# Patient Record
Sex: Female | Born: 1940
Health system: Southern US, Community
[De-identification: ages and names within clinical notes are randomized; demographics above are authoritative.]

## PROBLEM LIST (undated history)

## (undated) DIAGNOSIS — K219 Gastro-esophageal reflux disease without esophagitis: Secondary | ICD-10-CM

## (undated) DIAGNOSIS — N390 Urinary tract infection, site not specified: Secondary | ICD-10-CM

## (undated) DIAGNOSIS — I10 Essential (primary) hypertension: Secondary | ICD-10-CM

## (undated) DIAGNOSIS — D649 Anemia, unspecified: Secondary | ICD-10-CM

## (undated) DIAGNOSIS — K5730 Diverticulosis of large intestine without perforation or abscess without bleeding: Secondary | ICD-10-CM

## (undated) DIAGNOSIS — K449 Diaphragmatic hernia without obstruction or gangrene: Secondary | ICD-10-CM

## (undated) DIAGNOSIS — E785 Hyperlipidemia, unspecified: Secondary | ICD-10-CM

## (undated) DIAGNOSIS — Z5189 Encounter for other specified aftercare: Secondary | ICD-10-CM

## (undated) DIAGNOSIS — D369 Benign neoplasm, unspecified site: Secondary | ICD-10-CM

## (undated) HISTORY — DX: Gastro-esophageal reflux disease without esophagitis: K21.9

## (undated) HISTORY — PX: BREAST BIOPSY: SHX20

## (undated) HISTORY — DX: Urinary tract infection, site not specified: N39.0

## (undated) HISTORY — PX: NASAL SEPTUM SURGERY: SHX37

## (undated) HISTORY — PX: TONSILLECTOMY: SUR1361

## (undated) HISTORY — DX: Diaphragmatic hernia without obstruction or gangrene: K44.9

## (undated) HISTORY — DX: Diverticulosis of large intestine without perforation or abscess without bleeding: K57.30

## (undated) HISTORY — PX: APPENDECTOMY: SHX54

## (undated) HISTORY — DX: Hyperlipidemia, unspecified: E78.5

## (undated) HISTORY — DX: Encounter for other specified aftercare: Z51.89

## (undated) HISTORY — DX: Anemia, unspecified: D64.9

## (undated) HISTORY — DX: Essential (primary) hypertension: I10

## (undated) HISTORY — PX: CATARACT EXTRACTION, BILATERAL: SHX1313

## (undated) HISTORY — DX: Benign neoplasm, unspecified site: D36.9

## (undated) HISTORY — PX: COCHLEAR IMPLANT: SUR684

---

## 1998-07-15 ENCOUNTER — Other Ambulatory Visit: Admission: RE | Admit: 1998-07-15 | Discharge: 1998-07-15 | Payer: Self-pay | Admitting: Family Medicine

## 2001-01-03 ENCOUNTER — Other Ambulatory Visit: Admission: RE | Admit: 2001-01-03 | Discharge: 2001-01-03 | Payer: Self-pay | Admitting: Family Medicine

## 2002-05-09 ENCOUNTER — Other Ambulatory Visit: Admission: RE | Admit: 2002-05-09 | Discharge: 2002-05-09 | Payer: Self-pay | Admitting: Family Medicine

## 2003-12-15 ENCOUNTER — Other Ambulatory Visit: Admission: RE | Admit: 2003-12-15 | Discharge: 2003-12-15 | Payer: Self-pay | Admitting: Family Medicine

## 2005-01-03 ENCOUNTER — Ambulatory Visit: Payer: Self-pay | Admitting: Gastroenterology

## 2005-01-16 ENCOUNTER — Other Ambulatory Visit: Admission: RE | Admit: 2005-01-16 | Discharge: 2005-01-16 | Payer: Self-pay | Admitting: Family Medicine

## 2005-01-18 ENCOUNTER — Ambulatory Visit: Payer: Self-pay | Admitting: Gastroenterology

## 2006-08-14 LAB — CONVERTED CEMR LAB

## 2006-10-10 ENCOUNTER — Other Ambulatory Visit: Admission: RE | Admit: 2006-10-10 | Discharge: 2006-10-10 | Payer: Self-pay | Admitting: Family Medicine

## 2007-08-15 HISTORY — PX: CRANIECTOMY FOR EXCISION OF ACOUSTIC NEUROMA: SUR324

## 2008-05-16 ENCOUNTER — Encounter: Admission: RE | Admit: 2008-05-16 | Discharge: 2008-05-16 | Payer: Self-pay | Admitting: Otolaryngology

## 2008-10-21 ENCOUNTER — Ambulatory Visit: Payer: Self-pay | Admitting: Internal Medicine

## 2008-10-21 DIAGNOSIS — I10 Essential (primary) hypertension: Secondary | ICD-10-CM | POA: Insufficient documentation

## 2008-10-21 DIAGNOSIS — E785 Hyperlipidemia, unspecified: Secondary | ICD-10-CM | POA: Insufficient documentation

## 2008-10-21 DIAGNOSIS — K5730 Diverticulosis of large intestine without perforation or abscess without bleeding: Secondary | ICD-10-CM | POA: Insufficient documentation

## 2008-10-21 DIAGNOSIS — D649 Anemia, unspecified: Secondary | ICD-10-CM | POA: Insufficient documentation

## 2008-10-23 ENCOUNTER — Encounter: Payer: Self-pay | Admitting: Internal Medicine

## 2008-12-28 ENCOUNTER — Telehealth: Payer: Self-pay | Admitting: Internal Medicine

## 2008-12-29 ENCOUNTER — Encounter: Payer: Self-pay | Admitting: Internal Medicine

## 2008-12-31 ENCOUNTER — Telehealth: Payer: Self-pay | Admitting: Internal Medicine

## 2009-03-01 ENCOUNTER — Ambulatory Visit: Payer: Self-pay | Admitting: Internal Medicine

## 2009-03-01 ENCOUNTER — Encounter: Payer: Self-pay | Admitting: Internal Medicine

## 2009-03-03 ENCOUNTER — Telehealth: Payer: Self-pay | Admitting: Internal Medicine

## 2009-03-10 ENCOUNTER — Encounter: Admission: RE | Admit: 2009-03-10 | Discharge: 2009-05-11 | Payer: Self-pay | Admitting: Internal Medicine

## 2009-04-02 ENCOUNTER — Ambulatory Visit: Payer: Self-pay | Admitting: Internal Medicine

## 2009-10-11 ENCOUNTER — Ambulatory Visit: Payer: Self-pay | Admitting: Internal Medicine

## 2009-10-11 DIAGNOSIS — K5732 Diverticulitis of large intestine without perforation or abscess without bleeding: Secondary | ICD-10-CM | POA: Insufficient documentation

## 2009-10-11 LAB — CONVERTED CEMR LAB
Basophils Absolute: 0 10*3/uL (ref 0.0–0.1)
Eosinophils Relative: 0.6 % (ref 0.0–5.0)
Lymphs Abs: 1.6 10*3/uL (ref 0.7–4.0)
Monocytes Absolute: 0.6 10*3/uL (ref 0.1–1.0)
Neutro Abs: 6.9 10*3/uL (ref 1.4–7.7)
Platelets: 219 10*3/uL (ref 150.0–400.0)
WBC: 9.2 10*3/uL (ref 4.5–10.5)

## 2009-10-16 ENCOUNTER — Ambulatory Visit: Payer: Self-pay | Admitting: Family Medicine

## 2009-10-18 ENCOUNTER — Telehealth: Payer: Self-pay | Admitting: Internal Medicine

## 2009-12-16 ENCOUNTER — Telehealth: Payer: Self-pay | Admitting: Internal Medicine

## 2009-12-27 ENCOUNTER — Telehealth: Payer: Self-pay | Admitting: Internal Medicine

## 2010-02-24 ENCOUNTER — Telehealth: Payer: Self-pay | Admitting: Internal Medicine

## 2010-03-18 ENCOUNTER — Ambulatory Visit: Payer: Self-pay | Admitting: Internal Medicine

## 2010-03-21 ENCOUNTER — Encounter (INDEPENDENT_AMBULATORY_CARE_PROVIDER_SITE_OTHER): Payer: Self-pay | Admitting: *Deleted

## 2010-03-26 ENCOUNTER — Ambulatory Visit: Payer: Self-pay | Admitting: Family Medicine

## 2010-03-26 LAB — CONVERTED CEMR LAB
Bilirubin Urine: NEGATIVE
Blood in Urine, dipstick: NEGATIVE
Glucose, Urine, Semiquant: NEGATIVE
Ketones, urine, test strip: NEGATIVE
Nitrite: NEGATIVE
Specific Gravity, Urine: <1.005

## 2010-06-10 ENCOUNTER — Ambulatory Visit: Payer: Self-pay | Admitting: Internal Medicine

## 2010-06-10 DIAGNOSIS — J069 Acute upper respiratory infection, unspecified: Secondary | ICD-10-CM | POA: Insufficient documentation

## 2010-09-13 NOTE — Progress Notes (Signed)
  Phone Note Refill Request Message from:  Fax from Pharmacy on February 24, 2010 8:56 AM  Refills Requested: Medication #1:  LIPITOR 20 MG TABS Take 1 tablet by mouth once a day   Dosage confirmed as above?Dosage Confirmed   Supply Requested: 3 months   Last Refilled: 11/22/2009 Initial call taken by: Rock Nephew CMA,  February 24, 2010 8:57 AM    Prescriptions: LIPITOR 20 MG TABS (ATORVASTATIN CALCIUM) Take 1 tablet by mouth once a day  #90 x 3   Entered by:   Rock Nephew CMA   Authorized by:   Etta Grandchild MD   Signed by:   Rock Nephew CMA on 02/24/2010   Method used:   Faxed to ...       [facility] (retail)             , [place]         Ph: [phone]       Fax: [phone]   RxID:   [id number]

## 2010-09-13 NOTE — Letter (Signed)
Summary: TB Skin Test  All     ,     Phone:   Fax:           TB Skin Test    Emily Sweeney    Date TB Test Placed:  03/18/10  L  forearm  TB Test Placed by:  Orlan Leavens, RMA   Date TB Test Read:   03/21/10    Result: Negative  TB Test Read by:  Orlan Leavens, RMA

## 2010-09-13 NOTE — Assessment & Plan Note (Signed)
Summary: SORE THROAT   EARACHE   STC   Vital Signs:  Patient profile:   70 year old female Menstrual status:  postmenopausal Height:      65 inches Weight:      144.50 pounds O2 Sat:      95 % on Room air Temp:     98.7 degrees F oral Pulse rate:   80 / minute Pulse rhythm:   regular Resp:     16 per minute BP sitting:   120 / 72  (left arm) Cuff size:   regular  Vitals Entered By: Jarome Lamas (June 10, 2010 9:23 AM)  O2 Flow:  Room air CC: ear discomfort/pb, URI symptoms Comments pt no longer taking cipro 250 mg     Menstrual Status postmenopausal Last PAP Result done   Primary Care Provider:  Etta Grandchild MD  CC:  ear discomfort/pb and URI symptoms.  History of Present Illness:  URI Symptoms      This is a 70 year old woman who presents with URI symptoms.  The symptoms began 5 days ago.  The severity is described as mild.  The patient reports nasal congestion, clear nasal discharge, sore throat, earache, and sick contacts, but denies purulent nasal discharge and dry cough.  The patient denies fever, stiff neck, dyspnea, wheezing, rash, vomiting, diarrhea, use of an antipyretic, and response to antipyretic.  The patient denies headache, muscle aches, and severe fatigue.  The patient denies the following risk factors for Strep sinusitis: unilateral facial pain, unilateral nasal discharge, poor response to decongestant, double sickening, tooth pain, Strep exposure, tender adenopathy, and absence of cough.    Preventive Screening-Counseling & Management  Alcohol-Tobacco     Alcohol drinks/day: 0     Alcohol Counseling: not indicated; patient does not drink     Smoking Status: never     Tobacco Counseling: not indicated; no tobacco use  Current Medications (verified): 1)  Lipitor 20 Mg Tabs (Atorvastatin Calcium) .... Take 1 Tablet By Mouth Once A Day 2)  Ramipril 10 Mg Caps (Ramipril) .... Take 1 Tablet By Mouth Once A Day 3)  Amlodipine Besylate 10 Mg Tabs  (Amlodipine Besylate) .... 1/2 Qdtab 4)  Cipro 250 Mg Tabs (Ciprofloxacin Hcl) .Marland Kitchen.. 1 By Mouth Two Times A Day For 5 Days  Allergies (verified): 1)  ! Penicillin 2)  ! Sulfa 3)  ! * Ryzolt    Past History:  Past Medical History: Last updated: 10/11/2009 Anemia-NOS Diverticulosis, colon Hyperlipidemia Hypertension Right hearing loss and tinnitus  Past Surgical History: Last updated: 10/21/2008 right acoustic neuroma removed Appendectomy Lumpectomy Tonsillectomy  Family History: Last updated: 10/21/2008 Family History of Stroke M 1st degree relative <50 Family History of Sudden Death  Social History: Last updated: 10/21/2008 Occupation: Warden/ranger Divorced Never Smoked Alcohol use-no Drug use-no Regular exercise-yes  Risk Factors: Alcohol Use: 0 (06/10/2010) Exercise: yes (10/21/2008)  Risk Factors: Smoking Status: never (06/10/2010)  Family History: Reviewed history from 10/21/2008 and no changes required. Family History of Stroke M 1st degree relative <50 Family History of Sudden Death  Social History: Reviewed history from 10/21/2008 and no changes required. 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    Physical Exam  General:  alert, well-developed, well-nourished, well-hydrated, appropriate dress, normal appearance, and healthy-appearing.  Head:  normocephalic, atraumatic, no abnormalities observed, no abnormalities palpated, and no alopecia.   Ears:  R ear normal and L TM erythema.   Nose:  External nasal examination shows no deformity or inflammation. Nasal mucosa are pink and moist without lesions or exudates. Mouth:  good dentition, no exudates, no  posterior lymphoid hypertrophy, no postnasal drip, no pharyngeal crowing, no lesions, no aphthous ulcers, no erosions, no tongue abnormalities, no leukoplakia, no petechiae, and pharyngeal erythema.   Neck:  supple, full ROM, no masses, no thyromegaly, no JVD, normal carotid upstroke, no carotid bruits, no cervical lymphadenopathy, and no neck tenderness.   Lungs:  normal respiratory effort, no intercostal retractions, no accessory muscle use, normal breath sounds, no dullness, no fremitus, no crackles, and no wheezes.   Heart:  normal rate, regular rhythm, no murmur, no gallop, and no rub.   Abdomen:  soft, non-tender, normal bowel sounds, no distention, no masses, no guarding, no rigidity, no rebound tenderness, no abdominal hernia, no inguinal hernia, no hepatomegaly, and no splenomegaly.   Msk:  No deformity or scoliosis noted of thoracic or lumbar spine.   Pulses:  R and L carotid,radial,femoral,dorsalis pedis and posterior tibial pulses are full and equal bilaterally Extremities:  No clubbing, cyanosis, edema, or deformity noted with normal full range of motion of all joints.   Neurologic:  No cranial nerve deficits noted. Station and gait are normal. Plantar reflexes are down-going bilaterally. DTRs are symmetrical throughout. Sensory, motor and coordinative functions appear intact. Skin:  turgor normal, color normal, no rashes, no suspicious lesions, no ecchymoses, no petechiae, no purpura, no ulcerations, and no edema.   Cervical Nodes:  no anterior cervical adenopathy and no posterior cervical adenopathy.   Axillary Nodes:  no R axillary adenopathy and no L axillary adenopathy.   Inguinal Nodes:  no R inguinal adenopathy and no L inguinal adenopathy.   Psych:  Cognition and judgment appear intact. Alert and cooperative with normal attention span and concentration. No apparent delusions, illusions, hallucinations   Impression & Recommendations:  Problem # 1:  URI (ICD-465.9) Assessment  New  start Zithromax Instructed on symptomatic treatment. Call if symptoms persist or worsen.   Complete Medication List: 1)  Lipitor 20 Mg Tabs (Atorvastatin calcium) .... Take 1 tablet by mouth once a day 2)  Ramipril 10 Mg Caps (Ramipril) .... Take 1 tablet by mouth once a day 3)  Amlodipine Besylate 10 Mg Tabs (Amlodipine besylate) .... 1/2 qdtab 4)  Azithromycin 500 Mg Tabs (Azithromycin) .... One by mouth once daily for 3 days  Other Orders: Flu Vaccine 93yrs + MEDICARE PATIENTS (Z6109) Administration Flu vaccine - MCR (U0454)  Patient Instructions: 1)  Please schedule a follow-up appointment in 2 weeks. 2)  Get plenty of rest, drink lots of clear liquids, and use Tylenol or Ibuprofen for fever and comfort. Return in 7-10 days if you're not better:sooner if you're feeling worse. 3)  Take your antibiotic as prescribed until ALL of it is gone, but stop if you develop a rash or swelling and contact our office as soon as possible. Prescriptions: AZITHROMYCIN 500 MG TABS (AZITHROMYCIN) One by mouth once daily for 3 days  #3 x 0   Entered and Authorized by:   Etta Grandchild MD   Signed by:   Etta Grandchild MD on 06/10/2010   Method used:   Electronically to        [facility]* (retail)       [address] [facility]  [address]       [place], [place]  [address]       Ph: [phone]       Fax: [phone]   RxID:   [id number]    Orders Added: 1)  Flu Vaccine 59yrs + MEDICARE PATIENTS [Q2039] 2)  Administration Flu vaccine - MCR [G0008] 3)  Est. Patient Level IV [10272]   Flu Vaccine Consent Questions     Do you have a history of severe allergic reactions to this vaccine? no    Any prior history of allergic reactions to egg and/or gelatin? no    Do you have a sensitivity to the preservative Thimersol? no    Do you have a past history of Guillan-Barre Syndrome? no    Do you currently have an acute febrile illness? no    Have you ever had a severe reaction to latex? no     Vaccine information given and explained to patient? yes    Are you currently pregnant? no    Lot Number:[id number]   Exp Date:02/11/2011   Site Given  Left Deltoid IM        Flu Vaccine Consent Questions     Do you have a history of severe allergic reactions to this vaccine? no    Any prior history of allergic reactions to egg and/or gelatin? no    Do you have a sensitivity to the preservative Thimersol? no    Do you have a past history of Guillan-Barre Syndrome? no    Do you currently have an acute febrile illness? no    Have you ever had a severe reaction to latex? no    Vaccine information given and explained to patient? yes    Are you currently pregnant? no    Lot Number:[id number]   Exp Date:02/11/2011   Site Given  Left Deltoid IM

## 2010-09-13 NOTE — Progress Notes (Signed)
Summary: CALL A NURSE  Phone Note From Other Clinic   Summary of Call: [facility]Nurse Triage Call Report Bennie Dallas Operator: [id number] Triage Record Num: Call Date & Time: Emily Sweeney Patient Name: 10/16/2009 8:43:47AM Emily Sweeney PCP: Patient Phone: PCP Fax : [phone] Patient Gender: Female 1941-05-29 Patient DOB: Practice Name: [facility] Reason for Call: Pt calling, fell on pavement 10/15/09 landed on face, noted right eye is swollen, pooling of blood underneath eye, no changes in vision, has a lump above eye approx, w/superficial laceration, Per dispo see<4hrs, transferred call to office for appt Eye: Injury / UV Light Exposure Protocol(s) Used: See Provider within 4 hours Recommended Outcome per Protocol: Reason for Outcome: Severe swelling (involves all soft tissue) around eye after injury to face Care Advice: SYMPTOM / CONDITION MANAGEMENT Initial call taken by: Lamar Sprinkles, CMA,  October 18, 2009 1:36 PM

## 2010-09-13 NOTE — Assessment & Plan Note (Signed)
Summary: ABD PAIN/FEVER-LB   Vital Signs:  Patient profile:   70 year old female Height:      65 inches (165.10 cm) Weight:      145.2 pounds (66.00 kg) BMI:     24.25 O2 Sat:      96 % on Room air Temp:     99.0 degrees F (37.22 degrees C) oral Pulse rate:   98 / minute BP sitting:   120 / 78  (left arm) Cuff size:   regular  Vitals Entered By: Orlan Leavens (October 11, 2009 10:35 AM)  O2 Flow:  Room air CC: abdominal pain & fever x's 2 days. pt states she head 1 solid bowel movement yesterday. No diarrhea but been having abdominal pain since , Abdominal pain Is Patient Diabetic? No Pain Assessment Patient in pain? yes     Location: abdominal  Type: aching   Primary Care Provider:  Etta Grandchild MD  CC:  abdominal pain & fever x's 2 days. pt states she head 1 solid bowel movement yesterday. No diarrhea but been having abdominal pain since  and Abdominal pain.  History of Present Illness:  Abdominal Pain      This is a 70 year old woman who presents with Abdominal pain.  The symptoms began 2 days ago.  On a scale of 1 to 10, the intensity is described as a 4.  The patient reports constipation, but denies vomiting, melena, hematochezia, and hematemesis.  The location of the pain is right lower quadrant and left lower quadrant.  The pain is described as constant and sharp.  Associated symptoms include fever.  The patient denies the following symptoms: dysuria.    Current Medications (verified): 1)  Lipitor 20 Mg Tabs (Atorvastatin Calcium) .... Take 1 Tablet By Mouth Once A Day 2)  Ramipril 10 Mg Caps (Ramipril) .... Take 1 Tablet By Mouth Once A Day 3)  Amlodipine Besylate 10 Mg Tabs (Amlodipine Besylate) .... 1/2 Qdtab  Allergies (verified): 1)  ! Penicillin 2)  ! Sulfa 3)  ! * Ryzolt  Past History:  Past Medical History: Anemia-NOS Diverticulosis, colon Hyperlipidemia Hypertension Right hearing loss and tinnitus  Review of Systems       The patient  complains of anorexia, fever, and abdominal pain.  The patient denies hoarseness, chest pain, dyspnea on exertion, and headaches.    Physical Exam  General:  alert, well-developed, well-nourished, and cooperative to examination.    mildly ill Eyes:  vision grossly intact; pupils equal, round and reactive to light.  conjunctiva and lids normal.    Lungs:  normal respiratory effort, no intercostal retractions or use of accessory muscles; normal breath sounds bilaterally - no crackles and no wheezes.    Heart:  normal rate, regular rhythm, no murmur, and no rub. BLE without edema. Abdomen:  soft, mild-mod tender in LLQ, normal bowel sounds, no distention; no masses and no appreciable hepatomegaly or splenomegaly.     Impression & Recommendations:  Problem # 1:  DIVERTICULITIS OF COLON (ICD-562.11) known diverticulosis on prior colo check labs -  abx - clinda (in place of flagyl) + cipro  consider CT if cont fever or more pain or excessively high WBC/anemia but otherwise tx emperically as discussed -  Orders: Prescription Created Electronically [phone]) TLB-CBC Platelet - w/Differential (85025-CBCD)  Complete Medication List: 1)  Lipitor 20 Mg Tabs (Atorvastatin calcium) .... Take 1 tablet by mouth once a day 2)  Ramipril 10 Mg Caps (Ramipril) .... Take 1  tablet by mouth once a day 3)  Amlodipine Besylate 10 Mg Tabs (Amlodipine besylate) .... 1/2 qdtab 4)  Clindamycin Hcl 300 Mg Caps (Clindamycin hcl) .Marland Kitchen.. 1 by mouth four times a day x 7days 5)  Cipro 500 Mg Tabs (Ciprofloxacin hcl) .Marland Kitchen.. 1 by mouth two times a day x 7days  Patient Instructions: 1)  it was good to see you today. 2)  antibiotics for presumed diverticultitis as discussed - your prescriptions have been electronically submitted to your pharmacy. Please take as directed. Contact our office if you believe you're having problems with the medication(s).  3)  Your physician has requested that you have the following labwork done  today: CBC 4)  Get plenty of rest, drink lots of clear liquids, and use Tylenol or Ibuprofen for fever and comfort. Return in 7-10 days if you're not better:sooner if you're feeling worse. 5)  if your symptoms continue to worsen (pain, fever, etc), or if you are unable take anything by mouth (pills, fluids, etc), you should go to the emergency room for further evaluation and treatment.  Prescriptions: CIPRO 500 MG TABS (CIPROFLOXACIN HCL) 1 by mouth two times a day x 7days  #14 x 0   Entered and Authorized by:   Newt Lukes MD   Signed by:   Newt Lukes MD on 10/11/2009   Method used:   Electronically to        [facility]* (retail)       [address]       [place], [place]  [address]       Ph: [phone]       Fax: [phone]   RxID:   [id number] CLINDAMYCIN HCL 300 MG CAPS (CLINDAMYCIN HCL) 1 by mouth four times a day x 7days  #28 x 0   Entered and Authorized by:   Newt Lukes MD   Signed by:   Newt Lukes MD on 10/11/2009   Method used:   Electronically to        [facility]* (retail)       [address]       [place], [place]  [id number]       Ph: [phone]       Fax: [phone]   RxID:   [id number]

## 2010-09-13 NOTE — Assessment & Plan Note (Signed)
Summary: TB TEST / VAL Natale Milch  Nurse Visit   Vitals Entered By: Orlan Leavens RMA (March 18, 2010 3:04 PM)  Allergies: 1)  ! Penicillin 2)  ! Sulfa 3)  ! * Ryzolt  Immunizations Administered:  PPD Skin Test:    Vaccine Type: PPD    Site: left forearm    Mfr: Sanofi Pasteur    Dose: 0.1 ml    Route: ID    Given by: Orlan Leavens RMA    Exp. Date: 01/09/2012    Lot #: [id number]  PPD Results    Date of reading: 03/21/2010    Results: < 5mm    Interpretation: negative  Orders Added: 1)  TB Skin Test [86580] 2)  Admin 1st Vaccine [32202]

## 2010-09-13 NOTE — Progress Notes (Signed)
  Phone Note Refill Request Message from:  Fax from Pharmacy  Refills Requested: Medication #1:  RAMIPRIL 10 MG CAPS Take 1 tablet by mouth once a day   Last Refilled: 09/26/2009    Prescriptions: RAMIPRIL 10 MG CAPS (RAMIPRIL) Take 1 tablet by mouth once a day  #90 x 3   Entered by:   Rock Nephew CMA   Authorized by:   Etta Grandchild MD   Signed by:   Rock Nephew CMA on 12/16/2009   Method used:   Faxed to ...       [facility]* (mail-order)       [address]       [place], [place]  [address]       Ph: [phone]       Fax: [phone]   RxID:   [id number]

## 2010-09-13 NOTE — Assessment & Plan Note (Signed)
Summary: UTI//VGJ   Vital Signs:  Patient profile:   70 year old female Weight:      143 pounds BMI:     23.88 Temp:     98.1 degrees F Pulse rate:   83 / minute BP sitting:   126 / 68  (left arm)  Vitals Entered By: Lamar Sprinkles, CMA (March 26, 2010 9:49 AM) CC: C/o urinary frequency, burning & pain. LBP & pelvic pressure x 2 days. Has been drinking cranberry juice. OTC uti test was positive for leukocytes   History of Present Illness: had some mild frequency and burning several days ago  then worse  then low back pain and pain over bladder  drinks lots of water and cranberry  had pos leukocytes in urine at home   low grade fever yesterday  no n/v at all   gets utis occas -- in cycles -- has been several years   is all to pcn and sulfa  Allergies: 1)  ! Penicillin 2)  ! Sulfa 3)  ! 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 CV:  Denies chest pain or discomfort and palpitations. Resp:  Denies cough and wheezing. GI:  Denies abdominal pain and change in bowel habits. GU:  Complains of dysuria and urinary frequency. Derm:  Denies itching, lesion(s), poor wound healing, and rash. Neuro:  Denies numbness and tingling. Heme:  Denies abnormal bruising and  bleeding.  Physical Exam  General:  well appearing  Head:  normocephalic, atraumatic, and no abnormalities observed.   Mouth:  pharynx pink and moist.   Neck:  No deformities, masses, or tenderness noted. Lungs:  normal respiratory effort.   Heart:  normal rate, regular rhythm, no murmur, and no rub. BLE without edema. Abdomen:  tender over suprapubic area without rebound or gaurding   Msk:  no CVA tenderness  Extremities:  No clubbing, cyanosis, edema, or deformity noted with normal full range of motion of all joints.   Skin:  Intact without suspicious lesions or rashes Cervical Nodes:  No lymphadenopathy noted Inguinal Nodes:  No significant adenopathy Psych:  normal affect, talkative and pleasant    Impression & Recommendations:  Problem # 1:  UTI (ICD-599.0) Assessment New  with pos ua at home- neg here but has had a lot of fluids and diluted symptoms are classic for uti- will treat based on this  tx with cipro recommend sympt care- see pt instructions   pt advised to update me if symptoms worsen or do not improve  Her updated medication list for this problem includes:    Cipro 250 Mg Tabs (Ciprofloxacin hcl) .Marland Kitchen... 1 by mouth two times a day for 5 days  Orders: UA Dipstick w/o Micro (manual) (32440)  Complete Medication List: 1)  Lipitor 20 Mg Tabs (Atorvastatin calcium) .... Take 1 tablet by mouth once a day 2)  Ramipril 10 Mg Caps (Ramipril) .... Take 1 tablet by mouth once a day 3)  Amlodipine  Besylate 10 Mg Tabs (Amlodipine besylate) .... 1/2 qdtab 4)  Cipro 250 Mg Tabs (Ciprofloxacin hcl) .Marland Kitchen.. 1 by mouth two times a day for 5 days  Patient Instructions: 1)  continue drinking lots of water 2)  call or seek care is symptoms don't improve in 2-3 days or if you develop back pain, nausea, or vomiting  3)  take the cipro as directed -- I sent to your pharmacy  Prescriptions: CIPRO 250 MG TABS (CIPROFLOXACIN HCL) 1 by mouth two times a day for 5 days  #10 x 0    Entered and Authorized by:   Judith Part MD   Signed by:   Judith Part MD on 03/26/2010   Method used:   Electronically to        [facility]* (retail)       [address]       [place], [place]  [id number]       Ph: [phone]       Fax: [phone]   RxID:   [id number]   Laboratory Results   Urine Tests   Date/Time Reported: Lamar Sprinkles, CMA  March 26, 2010 9:56 AM   Routine Urinalysis   Color: lt. yellow Appearance: Clear Glucose: negative   (Normal Range: Negative) Bilirubin: negative   (Normal Range: Negative) Ketone: negative   (Normal Range: Negative) Spec. Gravity: <1.005   (Normal Range: 1.003-1.035) Blood: negative   (Normal Range: Negative) pH: 7.5   (Normal Range: 5.0-8.0) Protein: negative   (Normal Range: Negative) Urobilinogen: negative   (Normal Range: 0-1) Nitrite: negative   (Normal Range: Negative) Leukocyte Esterace: negative   (Normal Range: Negative)         Immunization History:  Tetanus/Td Immunization History:    Tetanus/Td:  historical (11/13/2006)

## 2010-09-13 NOTE — Assessment & Plan Note (Signed)
Summary: FELL AND HURT HEAD/DLO   Vital Signs:  Patient profile:   70 year old female Weight:      146 pounds Temp:     98 degrees F oral Pulse rate:   72 / minute Pulse rhythm:   regular BP sitting:   130 / 78  (left arm) Cuff size:   regular  Vitals Entered By: Lowella Petties CMA (October 16, 2009 9:53 AM) CC: Emily Sweeney yesterday, hit right side of head   Primary Care Provider:  Etta Grandchild MD   History of Present Illness: 70 year old:  Emily Sweeney on her face and hit her head, elbow and right knee Does not feel that bad overall - has some bruising around her R eye  Kept some ice on it.   no neuro signs.  the patient feels fine, she is thinking clearly,  she is acting normally,  however she is somewhat sore.  Review of systems: No nausea, vomiting, slurred speech,  altered muscular control. no balance issues.  Gen.: Well-developed well-nourished, no acute distress.  HEENT: Diffuse swelling and bruising around the patient's right eye there no step-offs palpable, there is no pain with manipulation of the nose, extraocular movements are intact, pupils equal round reactive to light and accommodation.   Right elbow with some abrasions, noted full range of motion with normal pronation and supination  Right knee additionally with some abrasions, however quite normal range of motion.. Stable varus and valgus stress, negative Lachman, no significant flexion pinch, negative McMurray's test. No patellar apprehension, and grossly stable exam.  Allergies: 1)  ! Penicillin 2)  ! Sulfa 3)  ! * Ryzolt  Past History:  Past medical, surgical, family and social histories (including risk factors) reviewed, and no changes noted (except as noted below).  Past Medical History: Reviewed history from 10/11/2009 and no changes required. Anemia-NOS Diverticulosis, colon Hyperlipidemia Hypertension Right hearing loss and tinnitus  Past Surgical History: Reviewed history from 10/21/2008 and no  changes required. right acoustic neuroma removed Appendectomy Lumpectomy Tonsillectomy  Family History: Reviewed history from 10/21/2008 and no changes required. Family History of Stroke M 1st degree relative <50 Family History of Sudden Death  Social History: Reviewed history from 10/21/2008 and no changes required. Occupation: Warden/ranger Divorced Never Smoked Alcohol use-no Drug use-no Regular exercise-yes  Physical Exam  Ears:  no external deformities.   Nose:  no external deformity.   Lungs:  normal respiratory effort.   Extremities:  No clubbing, cyanosis, edema, or deformity noted with normal full range of motion of all joints.   Neurologic:  alert & oriented X3 and gait normal.   Cervical Nodes:  No lymphadenopathy noted Psych:  Cognition and judgment appear intact. Alert and cooperative with normal attention span and concentration. No apparent delusions, illusions, hallucinations   Impression & Recommendations:  Problem # 1:  CONTUSION OF FACE SCALP AND NECK EXCEPT EYE (ICD-920) Assessment New consistent with soft tissue injury, black eye, right-sided.  Necessarily, patient will have bone bruise associated with this injury and fall, however not  consistent with acute fracture.   5 anticipate that the patient did have significant pain for minimally 3 weeks, and explained to her that her bruising will become more diffuse  particularly throughout  the right side of her face..  Recommended icing her face every 2 hours for 20 minutes,  throughout the weekend, and using Tylenol or Motrin as needed for pain.  date of injury October 15, 2009  Problem # 2:  CONTUSION, ELBOW (ICD-923.11) Assessment: New soft tissue contusion with bone contusion.  Problem # 3:  CONTUSION OF KNEE (ICD-924.11) Assessment: New do not suspect internal derangement, soft tissue contusion and bone contusion  Complete Medication List: 1)  Lipitor 20 Mg Tabs (Atorvastatin calcium) .... Take 1  tablet by mouth once a day 2)  Ramipril 10 Mg Caps (Ramipril) .... Take 1 tablet by mouth once a day 3)  Amlodipine Besylate 10 Mg Tabs (Amlodipine besylate) .... 1/2 qdtab 4)  Clindamycin Hcl 300 Mg Caps (Clindamycin hcl) .Marland Kitchen.. 1 by mouth four times a day x 7days 5)  Cipro 500 Mg Tabs (Ciprofloxacin hcl) .Marland Kitchen.. 1 by mouth two times a day x 7days  Prior Medications (reviewed today): LIPITOR 20 MG TABS (ATORVASTATIN CALCIUM) Take 1 tablet by mouth once a day RAMIPRIL 10 MG CAPS (RAMIPRIL) Take 1 tablet by mouth once a day AMLODIPINE BESYLATE 10 MG TABS (AMLODIPINE BESYLATE) 1/2 qdtab CLINDAMYCIN HCL 300 MG CAPS (CLINDAMYCIN HCL) 1 by mouth four times a day x 7days CIPRO 500 MG TABS (CIPROFLOXACIN HCL) 1 by mouth two times a day x 7days Current Allergies: ! PENICILLIN ! SULFA ! * RYZOLT

## 2010-09-13 NOTE — Progress Notes (Signed)
Summary: Refill--Amlodipine  Phone Note Refill Request Message from:  Fax from Pharmacy on Dec 27, 2009 3:13 PM  Refills Requested: Medication #1:  AMLODIPINE BESYLATE 10 MG TABS 1/2 qdtab.   Notes: [facility] Initial call taken by: Lucious Groves,  Dec 27, 2009 3:13 PM    Prescriptions: AMLODIPINE BESYLATE 10 MG TABS (AMLODIPINE BESYLATE) 1/2 qdtab  #90 x 3   Entered by:   Lucious Groves   Authorized by:   Etta Grandchild MD   Signed by:   Lucious Groves on 12/27/2009   Method used:   Faxed to ...       [facility]* (mail-order)       [address]       [place], [place]  [address]       Ph: [phone]       Fax: [phone]   RxID:   [id number]

## 2011-01-05 ENCOUNTER — Telehealth: Payer: Self-pay | Admitting: *Deleted

## 2011-01-05 MED ORDER — RAMIPRIL 10 MG PO CAPS: 10.0000 mg | ORAL_CAPSULE | Freq: Every day | ORAL | Status: DC

## 2011-01-05 NOTE — Telephone Encounter (Signed)
Rx Done . 

## 2011-01-06 ENCOUNTER — Other Ambulatory Visit: Payer: Self-pay | Admitting: *Deleted

## 2011-01-06 MED ORDER — RAMIPRIL 10 MG PO CAPS: 10.0000 mg | ORAL_CAPSULE | Freq: Every day | ORAL | Status: DC

## 2011-02-14 ENCOUNTER — Other Ambulatory Visit: Payer: Self-pay | Admitting: *Deleted

## 2011-02-14 MED ORDER — ATORVASTATIN CALCIUM 20 MG PO TABS: 20.0000 mg | ORAL_TABLET | Freq: Every day | ORAL | Status: DC

## 2011-03-30 ENCOUNTER — Other Ambulatory Visit: Payer: Self-pay | Admitting: *Deleted

## 2011-03-30 MED ORDER — AMLODIPINE BESYLATE 10 MG PO TABS: ORAL_TABLET | ORAL | Status: DC

## 2011-03-30 NOTE — Telephone Encounter (Signed)
Pt requesting refill of Amlodipine be sent to [facility]. Rx sent, pt informed.

## 2011-07-04 ENCOUNTER — Encounter: Payer: Self-pay | Admitting: Internal Medicine

## 2011-07-04 ENCOUNTER — Other Ambulatory Visit (INDEPENDENT_AMBULATORY_CARE_PROVIDER_SITE_OTHER): Payer: Medicare Other

## 2011-07-04 ENCOUNTER — Ambulatory Visit (INDEPENDENT_AMBULATORY_CARE_PROVIDER_SITE_OTHER): Payer: Medicare Other | Admitting: Internal Medicine

## 2011-07-04 VITALS — BP 122/72 | HR 73 | Temp 98.3°F | Resp 16 | Ht 67.0 in | Wt 147.0 lb

## 2011-07-04 DIAGNOSIS — N390 Urinary tract infection, site not specified: Secondary | ICD-10-CM | POA: Insufficient documentation

## 2011-07-04 DIAGNOSIS — Z23 Encounter for immunization: Secondary | ICD-10-CM

## 2011-07-04 DIAGNOSIS — I10 Essential (primary) hypertension: Secondary | ICD-10-CM

## 2011-07-04 LAB — URINALYSIS, ROUTINE W REFLEX MICROSCOPIC
Specific Gravity, Urine: 1.02 (ref 1.000–1.030)
Total Protein, Urine: NEGATIVE
Urobilinogen, UA: 0.2 (ref 0.0–1.0)
pH: 6 (ref 5.0–8.0)

## 2011-07-04 MED ORDER — CIPROFLOXACIN HCL 500 MG PO TABS: 500.0000 mg | ORAL_TABLET | Freq: Two times a day (BID) | ORAL | Status: AC

## 2011-07-04 MED ORDER — AMLODIPINE BESYLATE 10 MG PO TABS: ORAL_TABLET | ORAL | Status: DC

## 2011-07-04 NOTE — Patient Instructions (Signed)
Hypertension As your heart beats, it forces blood through your arteries. This force is your blood pressure. If the pressure is too high, it is called hypertension (HTN) or high blood pressure. HTN is dangerous because you may have it and not know it. High blood pressure may mean that your heart has to work harder to pump blood. Your arteries may be narrow or stiff. The extra work puts you at risk for heart disease, stroke, and other problems.  Blood pressure consists of two numbers, a higher number over a lower, 110/72, for example. It is stated as "110 over 72." The ideal is below 120 for the top number (systolic) and under 80 for the bottom (diastolic). Write down your blood pressure today. You should pay close attention to your blood pressure if you have certain conditions such as:  Heart failure.   Prior heart attack.   Diabetes   Chronic kidney disease.   Prior stroke.   Multiple risk factors for heart disease.  To see if you have HTN, your blood pressure should be measured while you are seated with your arm held at the level of the heart. It should be measured at least twice. A one-time elevated blood pressure reading (especially in the Emergency Department) does not mean that you need treatment. There may be conditions in which the blood pressure is different between your right and left arms. It is important to see your caregiver soon for a recheck. Most people have essential hypertension which means that there is not a specific cause. This type of high blood pressure may be lowered by changing lifestyle factors such as:  Stress.   Smoking.   Lack of exercise.   Excessive weight.   Drug/tobacco/alcohol use.   Eating less salt.  Most people do not have symptoms from high blood pressure until it has caused damage to the body. Effective treatment can often prevent, delay or reduce that damage. TREATMENT  When a cause has been identified, treatment for high blood pressure is  directed at the cause. There are a large number of medications to treat HTN. These fall into several categories, and your caregiver will help you select the medicines that are best for you. Medications may have side effects. You should review side effects with your caregiver. If your blood pressure stays high after you have made lifestyle changes or started on medicines,   Your medication(s) may need to be changed.   Other problems may need to be addressed.   Be certain you understand your prescriptions, and know how and when to take your medicine.   Be sure to follow up with your caregiver within the time frame advised (usually within two weeks) to have your blood pressure rechecked and to review your medications.   If you are taking more than one medicine to lower your blood pressure, make sure you know how and at what times they should be taken. Taking two medicines at the same time can result in blood pressure that is too low.  SEEK IMMEDIATE MEDICAL CARE IF:  You develop a severe headache, blurred or changing vision, or confusion.   You have unusual weakness or numbness, or a faint feeling.   You have severe chest or abdominal pain, vomiting, or breathing problems.  MAKE SURE YOU:   Understand these instructions.   Will watch your condition.   Will get help right away if you are not doing well or get worse.  Document Released: 07/31/2005 Document Revised: 04/12/2011 Document Reviewed:   03/20/2008 [employer] Patient Information 2012 [place], [place].Urinary Tract Infection Infections of the urinary tract can start in several places. A bladder infection (cystitis), a kidney infection (pyelonephritis), and a prostate infection (prostatitis) are different types of urinary tract infections (UTIs). They usually get better if treated with medicines (antibiotics) that kill germs. Take all the medicine until it is gone. You or your child may feel better in a few days, but TAKE ALL MEDICINE or the  infection may not respond and may become more difficult to treat. HOME CARE INSTRUCTIONS   Drink enough water and fluids to keep the urine clear or pale yellow. Cranberry juice is especially recommended, in addition to large amounts of water.   Avoid caffeine, tea, and carbonated beverages. They tend to irritate the bladder.   Alcohol may irritate the prostate.   Only take over-the-counter or prescription medicines for pain, discomfort, or fever as directed by your caregiver.  To prevent further infections:  Empty the bladder often. Avoid holding urine for long periods of time.   After a bowel movement, women should cleanse from front to back. Use each tissue only once.   Empty the bladder before and after sexual intercourse.  FINDING OUT THE RESULTS OF YOUR TEST Not all test results are available during your visit. If your or your child's test results are not back during the visit, make an appointment with your caregiver to find out the results. Do not assume everything is normal if you have not heard from your caregiver or the medical facility. It is important for you to follow up on all test results. SEEK MEDICAL CARE IF:   There is back pain.   Your baby is older than 3 months with a rectal temperature of 100.5 F (38.1 C) or higher for more than 1 day.   Your or your child's problems (symptoms) are no better in 3 days. Return sooner if you or your child is getting worse.  SEEK IMMEDIATE MEDICAL CARE IF:   There is severe back pain or lower abdominal pain.   You or your child develops chills.   You have a fever.   Your baby is older than 3 months with a rectal temperature of 102 F (38.9 C) or higher.   Your baby is 59 months old or younger with a rectal temperature of 100.4 F (38 C) or higher.   There is nausea or vomiting.   There is continued burning or discomfort with urination.  MAKE SURE YOU:   Understand these instructions.   Will watch your condition.    Will get help right away if you are not doing well or get worse.  Document Released: 05/10/2005 Document Revised: 04/12/2011 Document Reviewed: 12/13/2006 [facility] Patient Information 2012 [place], [place].

## 2011-07-05 ENCOUNTER — Encounter: Payer: Self-pay | Admitting: Internal Medicine

## 2011-07-05 ENCOUNTER — Telehealth: Payer: Self-pay

## 2011-07-05 DIAGNOSIS — N390 Urinary tract infection, site not specified: Secondary | ICD-10-CM

## 2011-07-05 MED ORDER — NITROFURANTOIN MONOHYD MACRO 100 MG PO CAPS: 100.0000 mg | ORAL_CAPSULE | Freq: Two times a day (BID) | ORAL | Status: DC

## 2011-07-05 MED ORDER — NITROFURANTOIN MONOHYD MACRO 100 MG PO CAPS: 100.0000 mg | ORAL_CAPSULE | Freq: Two times a day (BID) | ORAL | Status: AC

## 2011-07-05 NOTE — Progress Notes (Signed)
Subjective:    Patient ID: Emily Sweeney, female    DOB: April 25, 1941, 70 y.o.   MRN: [id number]  Urinary Tract Infection  This is a new problem. Episode onset: 10 days. The problem occurs every urination. The problem has been gradually worsening. The quality of the pain is described as burning. The pain is at a severity of 1/10. The pain is mild. There has been no fever. She is not sexually active. There is no history of pyelonephritis. Associated symptoms include frequency and urgency. Pertinent negatives include no chills, discharge, flank pain, hematuria, hesitancy, nausea, possible pregnancy, sweats or vomiting. She has tried nothing for the symptoms.  Hypertension This is a chronic problem. The current episode started more than 1 year ago. The problem has been gradually improving since onset. The problem is controlled. Pertinent negatives include no anxiety, blurred vision, chest pain, headaches, malaise/fatigue, neck pain, orthopnea, palpitations, peripheral edema, PND, shortness of breath or sweats. There are no associated agents to hypertension. Past treatments include calcium channel blockers and ACE inhibitors. The current treatment provides significant improvement. There are no compliance problems.       Review of Systems  Constitutional: Negative.  Negative for chills and malaise/fatigue.  HENT: Negative.  Negative for neck pain.   Eyes: Negative.  Negative for blurred vision.  Respiratory: Negative.  Negative for shortness of breath.   Cardiovascular: Negative for chest pain, palpitations, orthopnea, leg swelling and PND.  Gastrointestinal: Positive for abdominal pain (over her bladder). Negative for nausea, vomiting, diarrhea, constipation, blood in stool, abdominal distention, anal bleeding and rectal pain.  Genitourinary: Positive for dysuria, urgency and frequency. Negative for hesitancy, hematuria, flank pain, decreased urine volume, enuresis, difficulty urinating, genital  sores, menstrual problem, pelvic pain and dyspareunia.  Musculoskeletal: Negative for myalgias, back pain, joint swelling, arthralgias and gait problem.  Skin: Negative for color change, pallor, rash and wound.  Neurological: Negative for dizziness, tremors, seizures, syncope, facial asymmetry, speech difficulty, weakness, light-headedness, numbness and headaches.  Hematological: Negative.   Psychiatric/Behavioral: Negative.        Objective:   Physical Exam  Vitals reviewed. Constitutional: She is oriented to person, place, and time. She appears well-developed and well-nourished. No distress.  HENT:  Head: Normocephalic and atraumatic.  Mouth/Throat: Oropharynx is clear and moist. No oropharyngeal exudate.  Eyes: Conjunctivae are normal. Right eye exhibits no discharge. Left eye exhibits no discharge. No scleral icterus.  Neck: Normal range of motion. Neck supple. No JVD present. No tracheal deviation present. No thyromegaly present.  Cardiovascular: Normal rate, regular rhythm, normal heart sounds and intact distal pulses.  Exam reveals no gallop and no friction rub.   No murmur heard. Pulmonary/Chest: Effort normal and breath sounds normal. No stridor. No respiratory distress. She has no wheezes. She has no rales. She exhibits no tenderness.  Abdominal: Normal appearance and bowel sounds are normal. She exhibits no shifting dullness, no distension, no pulsatile liver, no fluid wave, no abdominal bruit, no ascites, no pulsatile midline mass and no mass. There is no hepatosplenomegaly. There is no tenderness. There is no rigidity, no rebound, no guarding, no CVA tenderness, no tenderness at McBurney's point and negative Murphy's sign. No hernia.  Musculoskeletal: Normal range of motion. She exhibits no edema and no tenderness.  Lymphadenopathy:    She has no cervical adenopathy.  Neurological: She is oriented to person, place, and time.  Skin: Skin is warm and dry. No rash noted. She is  not diaphoretic. No erythema. No pallor.  Psychiatric: She has a normal mood and affect. Her behavior is normal. Judgment and thought content normal.    Lab Results  Component Value Date   WBC 9.2 10/11/2009   HGB 13.4 10/11/2009   HCT 40.2 10/11/2009   PLT 219.0 10/11/2009        Assessment & Plan:

## 2011-07-05 NOTE — Assessment & Plan Note (Signed)
Her BP is well controlled 

## 2011-07-05 NOTE — Assessment & Plan Note (Signed)
Start cipro and check UA and urine culture

## 2011-07-05 NOTE — Telephone Encounter (Signed)
Stop cipro and start macribid

## 2011-07-05 NOTE — Telephone Encounter (Signed)
Pt states that she was seen in office on 07/04/11 and Rx'ed Cipro for a UTI. Pt thinks she is having an allergic reaction to the Cipro. She has developed a rash on her arms and chest. Please advise

## 2011-07-05 NOTE — Telephone Encounter (Signed)
Pt aware and Rx sent to [facility] instead of [address] Rx

## 2011-07-06 LAB — CULTURE, URINE COMPREHENSIVE

## 2011-07-21 ENCOUNTER — Other Ambulatory Visit: Payer: Self-pay | Admitting: Internal Medicine

## 2011-07-21 MED ORDER — RAMIPRIL 10 MG PO CAPS: 10.0000 mg | ORAL_CAPSULE | Freq: Every day | ORAL | Status: DC

## 2011-07-21 NOTE — Telephone Encounter (Signed)
Pt requesting ramipril 10mg  refill

## 2011-07-21 NOTE — Telephone Encounter (Signed)
RX sent per pt request.  

## 2011-07-25 ENCOUNTER — Telehealth: Payer: Self-pay | Admitting: *Deleted

## 2011-07-25 ENCOUNTER — Ambulatory Visit (INDEPENDENT_AMBULATORY_CARE_PROVIDER_SITE_OTHER): Payer: Medicare Other | Admitting: Internal Medicine

## 2011-07-25 ENCOUNTER — Other Ambulatory Visit (INDEPENDENT_AMBULATORY_CARE_PROVIDER_SITE_OTHER): Payer: Medicare Other

## 2011-07-25 ENCOUNTER — Encounter: Payer: Self-pay | Admitting: Internal Medicine

## 2011-07-25 VITALS — BP 122/68 | HR 88 | Temp 98.6°F | Resp 16 | Wt 147.0 lb

## 2011-07-25 DIAGNOSIS — I10 Essential (primary) hypertension: Secondary | ICD-10-CM

## 2011-07-25 DIAGNOSIS — N390 Urinary tract infection, site not specified: Secondary | ICD-10-CM

## 2011-07-25 DIAGNOSIS — J019 Acute sinusitis, unspecified: Secondary | ICD-10-CM

## 2011-07-25 LAB — URINALYSIS, ROUTINE W REFLEX MICROSCOPIC
Bilirubin Urine: NEGATIVE
Leukocytes, UA: NEGATIVE
Nitrite: NEGATIVE
Specific Gravity, Urine: 1.015 (ref 1.000–1.030)
Total Protein, Urine: NEGATIVE
Urobilinogen, UA: 0.2 (ref 0.0–1.0)

## 2011-07-25 MED ORDER — CEFUROXIME AXETIL 500 MG PO TABS: 500.0000 mg | ORAL_TABLET | Freq: Two times a day (BID) | ORAL | Status: DC

## 2011-07-25 MED ORDER — DOXYCYCLINE HYCLATE 100 MG PO TABS: 100.0000 mg | ORAL_TABLET | Freq: Two times a day (BID) | ORAL | Status: AC

## 2011-07-25 NOTE — Patient Instructions (Signed)

## 2011-07-25 NOTE — Assessment & Plan Note (Signed)
I will recheck her urine

## 2011-07-25 NOTE — Assessment & Plan Note (Signed)
Start ceftin for the infection 

## 2011-07-25 NOTE — Progress Notes (Signed)
Subjective:    Patient ID: Emily Sweeney, female    DOB: 1941/01/26, 70 y.o.   MRN: [id number]  Sinusitis This is a new problem. Episode onset: for 3 weeks. The problem has been gradually worsening since onset. There has been no fever. The fever has been present for less than 1 day. She is experiencing no pain. Associated symptoms include congestion, sinus pressure and sneezing. Pertinent negatives include no chills, coughing, diaphoresis, ear pain, headaches, hoarse voice, neck pain, shortness of breath, sore throat or swollen glands. Past treatments include nothing.      Review of Systems  Constitutional: Negative for fever, chills, diaphoresis, activity change, appetite change, fatigue and unexpected weight change.  HENT: Positive for congestion, rhinorrhea, sneezing, postnasal drip and sinus pressure. Negative for ear pain, nosebleeds, sore throat, hoarse voice, facial swelling, trouble swallowing, neck pain, neck stiffness and voice change.   Eyes: Negative.   Respiratory: Negative for cough, chest tightness, shortness of breath, wheezing and stridor.   Cardiovascular: Negative for chest pain, palpitations and leg swelling.  Gastrointestinal: Negative for nausea, vomiting, abdominal pain, diarrhea, constipation, blood in stool, abdominal distention, anal bleeding and rectal pain.  Genitourinary: Positive for dysuria. Negative for urgency, frequency, hematuria, flank pain, decreased urine volume, enuresis, difficulty urinating and dyspareunia.  Musculoskeletal: Negative for myalgias, back pain, joint swelling, arthralgias and gait problem.  Skin: Negative for color change, pallor, rash and wound.  Neurological: Negative for dizziness, tremors, seizures, syncope, facial asymmetry, speech difficulty, weakness, light-headedness, numbness and headaches.  Hematological: Negative for adenopathy. Does not bruise/bleed easily.  Psychiatric/Behavioral: Negative.        Objective:   Physical  Exam  Vitals reviewed. Constitutional: She is oriented to person, place, and time. She appears well-developed and well-nourished. No distress.  HENT:  Head: No trismus in the jaw.  Right Ear: Hearing, tympanic membrane, external ear and ear canal normal.  Left Ear: Hearing, tympanic membrane, external ear and ear canal normal.  Nose: Mucosal edema and rhinorrhea present. No nose lacerations, sinus tenderness, nasal deformity, septal deviation or nasal septal hematoma. No epistaxis.  No foreign bodies. Right sinus exhibits maxillary sinus tenderness. Right sinus exhibits no frontal sinus tenderness. Left sinus exhibits no maxillary sinus tenderness and no frontal sinus tenderness.  Mouth/Throat: Oropharynx is clear and moist and mucous membranes are normal. Mucous membranes are not pale, not dry and not cyanotic. No uvula swelling. No oropharyngeal exudate, posterior oropharyngeal edema, posterior oropharyngeal erythema or tonsillar abscesses.  Eyes: Conjunctivae are normal. Right eye exhibits no discharge. Left eye exhibits no discharge. No scleral icterus.  Neck: Normal range of motion. Neck supple. No JVD present. No tracheal deviation present. No thyromegaly present.  Cardiovascular: Normal rate, regular rhythm, normal heart sounds and intact distal pulses.  Exam reveals no gallop and no friction rub.   No murmur heard. Pulmonary/Chest: Effort normal and breath sounds normal. No stridor. No respiratory distress. She has no wheezes. She has no rales. She exhibits no tenderness.  Abdominal: Soft. Bowel sounds are normal. She exhibits no distension and no mass. There is no tenderness. There is no rebound and no guarding.  Musculoskeletal: Normal range of motion. She exhibits no edema and no tenderness.  Lymphadenopathy:    She has no cervical adenopathy.  Neurological: She is oriented to person, place, and time.  Skin: Skin is warm and dry. No rash noted. She is not diaphoretic. No erythema. No  pallor.  Psychiatric: She has a normal mood and affect. Her behavior  is normal. Judgment and thought content normal.      Lab Results  Component Value Date   WBC 9.2 10/11/2009   HGB 13.4 10/11/2009   HCT 40.2 10/11/2009   PLT 219.0 10/11/2009      Assessment & Plan:

## 2011-07-25 NOTE — Telephone Encounter (Signed)
Pharmacy called stating md sent e-scrpt for Ceftin, and pt is allergic to PCN ,Sulfonamide, and Cipro. Want to know is it still ok to give ceftin, or md want to change to something else. Dr. Yetta Barre is out this afternoon. Is this ok?

## 2011-07-25 NOTE — Assessment & Plan Note (Signed)
Her BP is well controlled 

## 2011-07-25 NOTE — Telephone Encounter (Signed)
Assuming there might be rash allergy to PCN, will change to doxy course since other antibx are also limited by allergy

## 2011-07-26 NOTE — Telephone Encounter (Signed)
Notified [place] spoke with mike/pharmacist they received rx yesterday & was pick up....07/26/11@9 :21am/LMB

## 2011-07-27 LAB — CULTURE, URINE COMPREHENSIVE: Organism ID, Bacteria: NO GROWTH

## 2011-08-07 ENCOUNTER — Ambulatory Visit (INDEPENDENT_AMBULATORY_CARE_PROVIDER_SITE_OTHER)
Admission: RE | Admit: 2011-08-07 | Discharge: 2011-08-07 | Disposition: A | Discharge status: Home / Self Care | Payer: Medicare Other | Source: Ambulatory Visit | Attending: Internal Medicine | Admitting: Internal Medicine

## 2011-08-07 ENCOUNTER — Other Ambulatory Visit (INDEPENDENT_AMBULATORY_CARE_PROVIDER_SITE_OTHER): Payer: Medicare Other

## 2011-08-07 ENCOUNTER — Encounter: Payer: Self-pay | Admitting: Internal Medicine

## 2011-08-07 ENCOUNTER — Ambulatory Visit (INDEPENDENT_AMBULATORY_CARE_PROVIDER_SITE_OTHER): Payer: Medicare Other | Admitting: Internal Medicine

## 2011-08-07 VITALS — BP 132/72 | HR 92 | Temp 99.2°F | Resp 16 | Wt 146.5 lb

## 2011-08-07 DIAGNOSIS — R519 Headache, unspecified: Secondary | ICD-10-CM | POA: Insufficient documentation

## 2011-08-07 DIAGNOSIS — I10 Essential (primary) hypertension: Secondary | ICD-10-CM

## 2011-08-07 DIAGNOSIS — H7090 Unspecified mastoiditis, unspecified ear: Secondary | ICD-10-CM

## 2011-08-07 DIAGNOSIS — R51 Headache: Secondary | ICD-10-CM

## 2011-08-07 DIAGNOSIS — J019 Acute sinusitis, unspecified: Secondary | ICD-10-CM

## 2011-08-07 LAB — BASIC METABOLIC PANEL
BUN: 12 mg/dL (ref 6–23)
CO2: 28 mEq/L (ref 19–32)
Calcium: 9.4 mg/dL (ref 8.4–10.5)
Creatinine, Ser: 0.7 mg/dL (ref 0.4–1.2)
GFR: 95.14 mL/min (ref >60.00)
Potassium: 4.7 mEq/L (ref 3.5–5.1)
Sodium: 140 mEq/L (ref 135–145)

## 2011-08-07 MED ORDER — IOHEXOL 300 MG/ML  SOLN
80.0000 mL | Freq: Once | INTRAMUSCULAR | Status: AC | PRN
  Administered 2011-08-07: 80 mL via INTRAVENOUS

## 2011-08-07 MED ORDER — AZITHROMYCIN 500 MG PO TABS: 500.0000 mg | ORAL_TABLET | Freq: Every day | ORAL | Status: AC

## 2011-08-07 NOTE — Progress Notes (Signed)
  Subjective:    Patient ID: Emily Sweeney, female    DOB: 1941-03-31, 70 y.o.   MRN: [id number]  HPI  She returns for f/up and she tells me that her sinus symptoms have improved but now she has localized pain on the right side of her scalp over the mastoid bone and around the [facility] device that she had placed about 3 years ago. She has also had a low grade fever as high as 100 degrees.  Review of Systems  Constitutional: Positive for fever. Negative for chills, diaphoresis, activity change, appetite change, fatigue and unexpected weight change.  HENT: Positive for ear pain (right), congestion, rhinorrhea, sneezing, postnasal drip and sinus pressure. Negative for hearing loss, sore throat, facial swelling, drooling, mouth sores, trouble swallowing, neck pain, neck stiffness, dental problem and voice change.   Eyes: Negative.   Respiratory: Negative.   Cardiovascular: Negative.   Gastrointestinal: Negative.   Genitourinary: Negative.   Musculoskeletal: Negative.   Skin: Negative.   Neurological: Positive for headaches. Negative for dizziness, tremors, seizures, syncope, facial asymmetry, speech difficulty, weakness, light-headedness and numbness.  Hematological: Negative for adenopathy. Does not bruise/bleed easily.  Psychiatric/Behavioral: Negative.        Objective:   Physical Exam  Vitals reviewed. Constitutional: She is oriented to person, place, and time. She appears well-developed and well-nourished. No distress.  HENT:  Head: No trismus in the jaw.    Right Ear: Hearing, tympanic membrane and ear canal normal. There is mastoid tenderness.  Left Ear: Hearing, tympanic membrane, external ear and ear canal normal.  Nose: Mucosal edema and rhinorrhea present. No nose lacerations, sinus tenderness, nasal deformity, septal deviation or nasal septal hematoma. No epistaxis.  No foreign bodies. Right sinus exhibits maxillary sinus tenderness. Right sinus exhibits no frontal sinus  tenderness. Left sinus exhibits maxillary sinus tenderness. Left sinus exhibits no frontal sinus tenderness.  Mouth/Throat: Oropharynx is clear and moist and mucous membranes are normal. Mucous membranes are not pale, not dry and not cyanotic. No uvula swelling. No oropharyngeal exudate, posterior oropharyngeal edema, posterior oropharyngeal erythema or tonsillar abscesses.  Eyes: Conjunctivae are normal. Right eye exhibits no discharge. Left eye exhibits no discharge. No scleral icterus.  Neck: Normal range of motion. Neck supple. No JVD present. No tracheal deviation present. No thyromegaly present.  Cardiovascular: Normal rate, regular rhythm, normal heart sounds and intact distal pulses.  Exam reveals no gallop and no friction rub.   No murmur heard. Pulmonary/Chest: Effort normal and breath sounds normal. No stridor. No respiratory distress. She has no wheezes. She has no rales. She exhibits no tenderness.  Abdominal: Soft. Bowel sounds are normal. She exhibits no distension and no mass. There is no tenderness. There is no rebound and no guarding.  Musculoskeletal: Normal range of motion. She exhibits no edema and no tenderness.  Lymphadenopathy:    She has no cervical adenopathy.  Neurological: She is oriented to person, place, and time.  Skin: Skin is warm and dry. No rash noted. She is not diaphoretic. No erythema. No pallor.  Psychiatric: She has a normal mood and affect. Her behavior is normal. Judgment and thought content normal.      Lab Results  Component Value Date   WBC 9.2 10/11/2009   HGB 13.4 10/11/2009   HCT 40.2 10/11/2009   PLT 219.0 10/11/2009      Assessment & Plan:

## 2011-08-07 NOTE — Patient Instructions (Signed)
Mastoiditis Mastoiditis is an infection that has spread from the middle ear to a bony area (the mastoid air cells) behind the middle ear. It is an uncommon complication of a middle ear infection. It occurs most often in young children. Treatment with the right antibiotics (medications that are used to treat bacteria germs) is generally effective. With the right treatment, there is a very high chance of full recovery.  SYMPTOMS  Some common symptoms of mastoiditis include:   Pain, swelling, redness, warmth, or a tender mass of the bone behind the ear.   Fever.   Fussiness and irritability.   Redness and swelling of the ear lobe or ear.   Ear drainage.   Headache.  DIAGNOSIS  Your caregiver will make the diagnosis based on an exam and on questions about what you or your child has been feeling. Other tests that may be done include:  Blood work.   Blood cultures or cultures of ear drainage.   X-rays.   If you or your child has symptoms that suggest more serious problems, additional tests and/or specialized x-rays may be done. These could include:   CT (CAT) scans.   Magnetic Resonance Imaging (MRI).  TREATMENT  Treatment will be based on how serious the infection is and what will be expected to give the best outcome. The treatment required may be:  Hospitalization and antibiotics given through a vein.   An operation (myringotomy) is sometimes done to relieve the pressure from the middle ear. This is a surgical procedure where a small hole is cut into the ear drum. A small tube is then placed to keep the hole open and draining. The tubes usually fall out on their own after 6 to 12 months.   In rare cases, if the above treatments do not work, more surgery may be necessary. This is called a mastoidectomy.  RISKS AND COMPLICATIONS These are rare if proper treatment is started early. These can include:  Facial paralysis   Infection and possible destruction of the mastoid bone.    Spread of infection to the neck.   Hearing loss which can be partial or complete on the side of the infection.   Infection spread to the brain.   Clots or blockage of blood vessels in the neck or brain.  HOME CARE INSTRUCTIONS   Take prescribed antibiotics and other medications as directed by your caregiver.   Follow-up with an exam by an ENT (Ear, Nose, and Throat) specialist if recommended.   A follow-up hearing test (audiogram) may be recommended.  SEEK MEDICAL CARE IF:   You develop recurrent fevers of 100 F (37.8 C) or higher.   You develop new headache, ear, or facial pain that had not happened before.   You feel that there has been loss of hearing.  SEEK IMMEDIATE MEDICAL CARE IF:   You develop fevers of 102 F (38.9 C).   You develop severe headache, ear, or facial pain.   You experience sudden hearing loss.   You develop repeated episodes of vomiting.   You develop weakness or drooping of one side of your face.   You experience weakness of one arm, one leg, or on one side of your body.   You develop sudden problems with speech and/or vision.  MAKE SURE YOU:   Understand these instructions.   Will watch your condition.   Will get help right away if you are not doing well or get worse.  Document Released: 08/30/2006 Document Revised: 04/12/2011 Document  Reviewed: 07/19/2007 [facility] Patient Information 2012 [place], [place].

## 2011-08-10 NOTE — Assessment & Plan Note (Signed)
The CT scan shows some air at the [facility] sight but the radiologist indicates that it appears to be a normal post-op appearance and that there is no evidence of infection, I have asked her to see the ENT surgeon in [place] that placed the BAHA 3 years ago

## 2011-08-10 NOTE — Assessment & Plan Note (Signed)
Ct scan done today to look for structural lesion (abscess, mass, etc)

## 2011-08-10 NOTE — Assessment & Plan Note (Signed)
Start zpak for the infection 

## 2011-09-11 ENCOUNTER — Telehealth: Payer: Self-pay

## 2011-09-11 MED ORDER — ATORVASTATIN CALCIUM 20 MG PO TABS: 20.0000 mg | ORAL_TABLET | Freq: Every day | ORAL | Status: DC

## 2011-09-11 NOTE — Telephone Encounter (Signed)
Mail order refill request

## 2011-12-18 ENCOUNTER — Encounter: Payer: Self-pay | Admitting: Internal Medicine

## 2011-12-18 ENCOUNTER — Ambulatory Visit (INDEPENDENT_AMBULATORY_CARE_PROVIDER_SITE_OTHER): Payer: Medicare Other | Admitting: Internal Medicine

## 2011-12-18 ENCOUNTER — Other Ambulatory Visit (INDEPENDENT_AMBULATORY_CARE_PROVIDER_SITE_OTHER): Payer: Medicare Other

## 2011-12-18 ENCOUNTER — Ambulatory Visit (INDEPENDENT_AMBULATORY_CARE_PROVIDER_SITE_OTHER)
Admission: RE | Admit: 2011-12-18 | Discharge: 2011-12-18 | Disposition: A | Discharge status: Home / Self Care | Payer: Medicare Other | Source: Ambulatory Visit | Attending: Internal Medicine | Admitting: Internal Medicine

## 2011-12-18 VITALS — BP 130/82 | HR 78 | Temp 98.6°F | Resp 16 | Wt 147.0 lb

## 2011-12-18 DIAGNOSIS — D649 Anemia, unspecified: Secondary | ICD-10-CM | POA: Diagnosis not present

## 2011-12-18 DIAGNOSIS — E7800 Pure hypercholesterolemia, unspecified: Secondary | ICD-10-CM

## 2011-12-18 DIAGNOSIS — M25569 Pain in unspecified knee: Secondary | ICD-10-CM | POA: Diagnosis not present

## 2011-12-18 DIAGNOSIS — I10 Essential (primary) hypertension: Secondary | ICD-10-CM

## 2011-12-18 DIAGNOSIS — R7309 Other abnormal glucose: Secondary | ICD-10-CM

## 2011-12-18 DIAGNOSIS — M25562 Pain in left knee: Secondary | ICD-10-CM

## 2011-12-18 DIAGNOSIS — M1712 Unilateral primary osteoarthritis, left knee: Secondary | ICD-10-CM | POA: Insufficient documentation

## 2011-12-18 DIAGNOSIS — Z1231 Encounter for screening mammogram for malignant neoplasm of breast: Secondary | ICD-10-CM | POA: Insufficient documentation

## 2011-12-18 DIAGNOSIS — R739 Hyperglycemia, unspecified: Secondary | ICD-10-CM | POA: Insufficient documentation

## 2011-12-18 LAB — URINALYSIS, ROUTINE W REFLEX MICROSCOPIC
Ketones, ur: NEGATIVE
Specific Gravity, Urine: 1.025 (ref 1.000–1.030)
Total Protein, Urine: NEGATIVE
Urine Glucose: 100
Urobilinogen, UA: 0.2 (ref 0.0–1.0)
pH: 6 (ref 5.0–8.0)

## 2011-12-18 LAB — LIPID PANEL
HDL: 82.9 mg/dL (ref >39.00)
Triglycerides: 95 mg/dL (ref 0.0–149.0)
VLDL: 19 mg/dL (ref 0.0–40.0)

## 2011-12-18 LAB — CBC WITH DIFFERENTIAL/PLATELET
Eosinophils Relative: 0.9 % (ref 0.0–5.0)
Lymphocytes Relative: 24.5 % (ref 12.0–46.0)
MCHC: 33.1 g/dL (ref 30.0–36.0)
MCV: 95.1 fl (ref 78.0–100.0)
Monocytes Absolute: 0.6 10*3/uL (ref 0.1–1.0)
Neutrophils Relative %: 66.8 % (ref 43.0–77.0)
RDW: 14.2 % (ref 11.5–14.6)

## 2011-12-18 LAB — COMPREHENSIVE METABOLIC PANEL
ALT: 15 U/L (ref 0–35)
Albumin: 4.4 g/dL (ref 3.5–5.2)
Alkaline Phosphatase: 77 U/L (ref 39–117)
BUN: 19 mg/dL (ref 6–23)
Calcium: 9.2 mg/dL (ref 8.4–10.5)
Glucose, Bld: 90 mg/dL (ref 70–99)
Total Protein: 7.5 g/dL (ref 6.0–8.3)

## 2011-12-18 LAB — HEMOGLOBIN A1C: Hgb A1c MFr Bld: 6 % (ref 4.6–6.5)

## 2011-12-18 NOTE — Patient Instructions (Signed)
Hypertension As your heart beats, it forces blood through your arteries. This force is your blood pressure. If the pressure is too high, it is called hypertension (HTN) or high blood pressure. HTN is dangerous because you may have it and not know it. High blood pressure may mean that your heart has to work harder to pump blood. Your arteries may be narrow or stiff. The extra work puts you at risk for heart disease, stroke, and other problems.  Blood pressure consists of two numbers, a higher number over a lower, 110/72, for example. It is stated as "110 over 72." The ideal is below 120 for the top number (systolic) and under 80 for the bottom (diastolic). Write down your blood pressure today. You should pay close attention to your blood pressure if you have certain conditions such as:  Heart failure.   Prior heart attack.   Diabetes   Chronic kidney disease.   Prior stroke.   Multiple risk factors for heart disease.  To see if you have HTN, your blood pressure should be measured while you are seated with your arm held at the level of the heart. It should be measured at least twice. A one-time elevated blood pressure reading (especially in the Emergency Department) does not mean that you need treatment. There may be conditions in which the blood pressure is different between your right and left arms. It is important to see your caregiver soon for a recheck. Most people have essential hypertension which means that there is not a specific cause. This type of high blood pressure may be lowered by changing lifestyle factors such as:  Stress.   Smoking.   Lack of exercise.   Excessive weight.   Drug/tobacco/alcohol use.   Eating less salt.  Most people do not have symptoms from high blood pressure until it has caused damage to the body. Effective treatment can often prevent, delay or reduce that damage. TREATMENT  When a cause has been identified, treatment for high blood pressure is  directed at the cause. There are a large number of medications to treat HTN. These fall into several categories, and your caregiver will help you select the medicines that are best for you. Medications may have side effects. You should review side effects with your caregiver. If your blood pressure stays high after you have made lifestyle changes or started on medicines,   Your medication(s) may need to be changed.   Other problems may need to be addressed.   Be certain you understand your prescriptions, and know how and when to take your medicine.   Be sure to follow up with your caregiver within the time frame advised (usually within two weeks) to have your blood pressure rechecked and to review your medications.   If you are taking more than one medicine to lower your blood pressure, make sure you know how and at what times they should be taken. Taking two medicines at the same time can result in blood pressure that is too low.  SEEK IMMEDIATE MEDICAL CARE IF:  You develop a severe headache, blurred or changing vision, or confusion.   You have unusual weakness or numbness, or a faint feeling.   You have severe chest or abdominal pain, vomiting, or breathing problems.  MAKE SURE YOU:   Understand these instructions.   Will watch your condition.   Will get help right away if you are not doing well or get worse.  Document Released: 07/31/2005 Document Revised: 07/20/2011 Document Reviewed:   03/20/2008 [employer] Patient Information 2012 [place], [place].Degenerative Arthritis You have osteoarthritis. This is the wear and tear arthritis that comes with aging. It is also called degenerative arthritis. This is common in people past middle age. It is caused by stress on the joints. The large weight bearing joints of the lower extremities are most often affected. The knees, hips, back, neck, and hands can become painful, swollen, and stiff. This is the most common type of arthritis. It comes on  with age, carrying too much weight, or from an injury. Treatment includes resting the sore joint until the pain and swelling improve. Crutches or a walker may be needed for severe flares. Only take over-the-counter or prescription medicines for pain, discomfort, or fever as directed by your caregiver. Local heat therapy may improve motion. Cortisone shots into the joint are sometimes used to reduce pain and swelling during flares. Osteoarthritis is usually not crippling and progresses slowly. There are things you can do to decrease pain:  Avoid high impact activities.   Exercise regularly.   Low impact exercises such as walking, biking and swimming help to keep the muscles strong and keep normal joint function.   Stretching helps to keep your range of motion.   Lose weight if you are overweight. This reduces joint stress.  In severe cases when you have pain at rest or increasing disability, joint surgery may be helpful. See your caregiver for follow-up treatment as recommended.  SEEK IMMEDIATE MEDICAL CARE IF:   You have severe joint pain.   Marked swelling and redness in your joint develops.   You develop a high fever.  Document Released: 07/31/2005 Document Revised: 07/20/2011 Document Reviewed: 12/31/2006 [facility] Patient Information 2012 [place], [place].

## 2011-12-18 NOTE — Assessment & Plan Note (Signed)
I think she has DJD, she does not want anything for pain, I will check a plain film today

## 2011-12-18 NOTE — Assessment & Plan Note (Signed)
She has no s/s of blood loss, I will recheck her CBC today 

## 2011-12-18 NOTE — Assessment & Plan Note (Signed)
a1c today to see if she has DM

## 2011-12-18 NOTE — Assessment & Plan Note (Signed)
FLP today 

## 2011-12-18 NOTE — Assessment & Plan Note (Signed)
Her BP is well controlled, I will recheck her lytes and renal function 

## 2011-12-18 NOTE — Progress Notes (Signed)
Subjective:    Patient ID: Emily Sweeney, female    DOB: October 23, 1940, 71 y.o.   MRN: [id number]  Knee Pain  The incident occurred more than 1 week ago. The incident occurred at home. There was no injury mechanism. The pain is present in the left knee. The quality of the pain is described as aching. The pain is at a severity of 2/10. The pain is mild. The pain has been intermittent since onset. Pertinent negatives include no inability to bear weight, loss of motion, loss of sensation, muscle weakness, numbness or tingling. The symptoms are aggravated by weight bearing. She has tried nothing for the symptoms.  Hypertension This is a chronic problem. The current episode started more than 1 year ago. The problem has been gradually improving since onset. The problem is controlled. Pertinent negatives include no anxiety, blurred vision, chest pain, headaches, malaise/fatigue, neck pain, orthopnea, palpitations, peripheral edema, PND, shortness of breath or sweats. Past treatments include ACE inhibitors and calcium channel blockers. The current treatment provides significant improvement. There are no compliance problems.  There is no history of chronic renal disease.  Hyperlipidemia This is a chronic problem. The current episode started more than 1 year ago. The problem is controlled. Recent lipid tests were reviewed and are variable. She has no history of chronic renal disease, diabetes, hypothyroidism, liver disease, obesity or nephrotic syndrome. Factors aggravating her hyperlipidemia include no known factors. Pertinent negatives include no chest pain, myalgias or shortness of breath. Current antihyperlipidemic treatment includes statins. The current treatment provides moderate improvement of lipids. There are no compliance problems.       Review of Systems  Constitutional: Negative for fever, chills, malaise/fatigue, diaphoresis, activity change, appetite change, fatigue and unexpected weight change.    HENT: Negative.  Negative for neck pain.   Eyes: Negative.  Negative for blurred vision.  Respiratory: Negative.  Negative for shortness of breath.   Cardiovascular: Negative for chest pain, palpitations, orthopnea, leg swelling and PND.  Gastrointestinal: Negative for nausea, vomiting, abdominal pain, diarrhea, constipation, blood in stool and abdominal distention.  Genitourinary: Negative.   Musculoskeletal: Positive for arthralgias (left knee). Negative for myalgias, back pain, joint swelling and gait problem.  Skin: Negative for color change, pallor, rash and wound.  Neurological: Negative for dizziness, tingling, tremors, seizures, syncope, facial asymmetry, speech difficulty, weakness, light-headedness, numbness and headaches.  Hematological: Negative for adenopathy. Does not bruise/bleed easily.  Psychiatric/Behavioral: Negative.        Objective:   Physical Exam  Vitals reviewed. Constitutional: She is oriented to person, place, and time. She appears well-developed and well-nourished. No distress.  HENT:  Head: Normocephalic and atraumatic.  Mouth/Throat: Oropharynx is clear and moist. No oropharyngeal exudate.  Eyes: Conjunctivae are normal. Right eye exhibits no discharge. Left eye exhibits no discharge. No scleral icterus.  Neck: Normal range of motion. Neck supple. No JVD present. No tracheal deviation present. No thyromegaly present.  Cardiovascular: Normal rate, regular rhythm, normal heart sounds and intact distal pulses.  Exam reveals no gallop and no friction rub.   No murmur heard. Pulmonary/Chest: Effort normal and breath sounds normal. No stridor. No respiratory distress. She has no wheezes. She has no rales. She exhibits no tenderness.  Abdominal: Soft. Bowel sounds are normal. She exhibits no distension and no mass. There is no tenderness. There is no rebound and no guarding.  Musculoskeletal: Normal range of motion. She exhibits no edema and no tenderness.        Left knee: Normal.  She exhibits normal range of motion, no swelling, no effusion, no ecchymosis, no deformity, no laceration, no erythema, normal alignment, no LCL laxity, normal patellar mobility, no bony tenderness, normal meniscus and no MCL laxity. no tenderness found.  Lymphadenopathy:    She has no cervical adenopathy.  Neurological: She is oriented to person, place, and time.  Skin: Skin is warm and dry. No rash noted. She is not diaphoretic. No erythema. No pallor.  Psychiatric: She has a normal mood and affect. Her behavior is normal. Judgment and thought content normal.          Assessment & Plan:

## 2011-12-19 ENCOUNTER — Encounter: Payer: Self-pay | Admitting: Internal Medicine

## 2012-01-02 ENCOUNTER — Ambulatory Visit (HOSPITAL_COMMUNITY)
Admission: RE | Admit: 2012-01-02 | Discharge: 2012-01-02 | Disposition: A | Discharge status: Home / Self Care | Payer: Medicare Other | Source: Ambulatory Visit | Attending: Internal Medicine | Admitting: Internal Medicine

## 2012-01-02 DIAGNOSIS — Z1231 Encounter for screening mammogram for malignant neoplasm of breast: Secondary | ICD-10-CM | POA: Insufficient documentation

## 2012-01-03 LAB — HM MAMMOGRAPHY

## 2012-01-09 ENCOUNTER — Telehealth: Payer: Self-pay | Admitting: Internal Medicine

## 2012-01-09 DIAGNOSIS — H43393 Other vitreous opacities, bilateral: Secondary | ICD-10-CM

## 2012-01-09 NOTE — Telephone Encounter (Signed)
done

## 2012-01-09 NOTE — Telephone Encounter (Signed)
The pt called and is hoping to get a referral to an eye dr.   Lynford Humphrey!    [phone]

## 2012-01-09 NOTE — Telephone Encounter (Signed)
Spoke with patient who need referral for visual changes with reading and floaters. Thanks

## 2012-01-15 DIAGNOSIS — H43819 Vitreous degeneration, unspecified eye: Secondary | ICD-10-CM | POA: Diagnosis not present

## 2012-01-30 ENCOUNTER — Other Ambulatory Visit: Payer: Self-pay | Admitting: Internal Medicine

## 2012-02-13 DIAGNOSIS — H43819 Vitreous degeneration, unspecified eye: Secondary | ICD-10-CM | POA: Diagnosis not present

## 2012-07-28 ENCOUNTER — Other Ambulatory Visit: Payer: Self-pay | Admitting: Internal Medicine

## 2012-08-21 DIAGNOSIS — Z23 Encounter for immunization: Secondary | ICD-10-CM | POA: Diagnosis not present

## 2012-08-23 ENCOUNTER — Other Ambulatory Visit: Payer: Self-pay

## 2012-08-23 MED ORDER — RAMIPRIL 10 MG PO CAPS: ORAL_CAPSULE | ORAL | Status: DC

## 2012-08-29 ENCOUNTER — Ambulatory Visit (INDEPENDENT_AMBULATORY_CARE_PROVIDER_SITE_OTHER): Payer: Medicare Other | Admitting: Emergency Medicine

## 2012-08-29 ENCOUNTER — Ambulatory Visit: Payer: Medicare Other

## 2012-08-29 VITALS — BP 162/88 | HR 106 | Temp 97.5°F | Resp 20

## 2012-08-29 DIAGNOSIS — S0083XA Contusion of other part of head, initial encounter: Secondary | ICD-10-CM

## 2012-08-29 DIAGNOSIS — S0590XA Unspecified injury of unspecified eye and orbit, initial encounter: Secondary | ICD-10-CM

## 2012-08-29 DIAGNOSIS — H1130 Conjunctival hemorrhage, unspecified eye: Secondary | ICD-10-CM

## 2012-08-29 DIAGNOSIS — S0510XA Contusion of eyeball and orbital tissues, unspecified eye, initial encounter: Secondary | ICD-10-CM

## 2012-08-29 DIAGNOSIS — H5710 Ocular pain, unspecified eye: Secondary | ICD-10-CM | POA: Diagnosis not present

## 2012-08-29 DIAGNOSIS — S1093XA Contusion of unspecified part of neck, initial encounter: Secondary | ICD-10-CM | POA: Diagnosis not present

## 2012-08-29 DIAGNOSIS — S0003XA Contusion of scalp, initial encounter: Secondary | ICD-10-CM

## 2012-08-29 MED ORDER — HYDROCODONE-ACETAMINOPHEN 5-325 MG PO TABS: 1.0000 | ORAL_TABLET | Freq: Four times a day (QID) | ORAL | Status: DC | PRN

## 2012-08-29 NOTE — Progress Notes (Signed)
[facility] [address], [place] [address] [phone]  Date:  08/29/2012   Name:  Emily Sweeney   DOB:  01-03-1941   MRN:  [id number]  PCP:  Sanda Linger, MD    Chief Complaint: Eye Injury and Facial Injury   History of Present Illness:  Emily Sweeney is a 72 y.o. very pleasant female patient who presents with the following:  Tripped and fell today while walking her dog and struck her right face on the sidewalk.  Suffered no LOC.  Has no neuro or visual symptoms.  Noticed swelling and bleeding on the right eye and came in.  Has abrasion to left thumb. Current on TD.  Did not loose consciousness.  Patient Active Problem List  Diagnosis  . HYPERCHOLESTEROLEMIA, PURE  . ANEMIA-NOS  . HYPERTENSION  . DIVERTICULITIS OF COLON  . Knee pain, acute, left  . Other abnormal glucose  . Other screening mammogram  . Flashers or floaters of both eyes    Past Medical History  Diagnosis Date  . Anemia   . Diverticulosis of colon   . Hyperlipidemia   . Hypertension   . Hearing loss     right ear, and tinnitus    Past Surgical History  Procedure Date  . Craniectomy for excision of acoustic neuroma   . Appendectomy   . Breast lumpectomy   . Tonsillectomy     History  Substance Use Topics  . Smoking status: Former Smoker    Quit date: 07/04/1991  . Smokeless tobacco: Never Used  . Alcohol Use: No    Family History  Problem Relation Age of Onset  . Stroke Other     1st relative <50  . Sudden death Other     family history    Allergies  Allergen Reactions  . Ceftin (Cefuroxime Axetil)   . Ciprofloxacin Rash  . Penicillins Hives and Rash  . Sulfonamide Derivatives Hives and Rash    Medication list has been reviewed and updated.  Current Outpatient Prescriptions on File Prior to Visit  Medication Sig Dispense Refill  . amLODipine (NORVASC) 10 MG tablet Take 1/2 tablet by mouth  daily  45 tablet  0  . atorvastatin (LIPITOR) 20 MG  tablet Take 1 tablet (20 mg total) by mouth daily.  90 tablet  3  . ramipril (ALTACE) 10 MG capsule TAKE (1) CAPSULE DAILY.  90 capsule  1    Review of Systems:  As per HPI, otherwise negative.    Physical Examination: Filed Vitals:   08/29/12 1125  BP: 162/88  Pulse: 106  Temp: 97.5 F (36.4 C)  Resp: 20   There were no vitals filed for this visit. There is no height or weight on file to calculate BMI. Ideal Body Weight:    GEN: WDWN, NAD, Non-toxic, A & O x 3 HEENT: periorbital contusion, Normocephalic. Neck supple. No masses, No LAD.  Right subconjunctival hemorrhage.  No hyphema.  PRRERLA EOMI.  NO FB.  CN 2-12 intact. Ears and Nose: No external deformity. CV: RRR, No M/G/R. No JVD. No thrill. No extra heart sounds. PULM: CTA B, no wheezes, crackles, rhonchi. No retractions. No resp. distress. No accessory muscle use. ABD: S, NT, ND, +BS. No rebound. No HSM. EXTR: No c/c/e NEURO Normal gait.  PSYCH: Normally interactive. Conversant. Not depressed or anxious appearing.  Calm demeanor.    Assessment and Plan: Periorbital contusion Subconjunctival hemorrhage Abrasion hand  Hydrocodone EYE follow up with  ophthalmologist Local ice  Carmelina Dane, MD  [facility] reading (PRIMARY) by  Dr. Dareen Piano.  No orbital fracture.

## 2012-08-29 NOTE — Patient Instructions (Addendum)
Eye Contusion An eye contusion is a deep bruise of the eye. This is often called a "black eye." Contusions are the result of an injury that caused bleeding under the skin. The contusion may turn blue, purple, or yellow. Minor injuries will give you a painless contusion, but more severe contusions may stay painful and swollen for a few weeks. If the eye contusion only involves the eyelids and tissues around the eye, the injured area will get better within a few days to weeks. However, eye contusions can be serious and affect the eyeball and sight. CAUSES   Blunt injury or trauma to the face or eye area.  A forehead injury that causes the blood under the skin to work its way down to the eyelids.  Rubbing the eyes due to irritation. SYMPTOMS   Swelling and redness around the eye.  Bruising around the eye.  Tenderness, soreness, or pain around the eye.  Blurry vision.  Tearing.  Eyeball redness. DIAGNOSIS  A diagnosis is usually based on a thorough exam of the eye and surrounding area. The eye must be looked at carefully to make sure it is not injured and to make sure nothing else will threaten your vision. A vision test may be done. An X-ray or computed tomography (CT) scan may be needed to determine if there are any associated injuries, such as broken bones (fractures). TREATMENT  If there is an injury to the eye, treatment will be determined by the nature of the injury. HOME CARE INSTRUCTIONS   Put ice on the injured area.  Put ice in a plastic bag.  Place a towel between your skin and the bag.  Leave the ice on for 15 to 20 minutes, 3 to 4 times a day.  If it is determined that there is no injury to the eye, you may continue normal activities.  Sunglasses may be worn to protect your eyes from bright light if light is uncomfortable.  Sleep with your head elevated. You can put an extra pillow under your head. This may help with discomfort.  Only take over-the-counter or  prescription medicines for pain, discomfort, or fever as directed by your caregiver. Do not take aspirin for the first few days. This may increase bruising. SEEK IMMEDIATE MEDICAL CARE IF:   You have any form of vision loss.  You have double vision.  You feel nauseous.  You feel dizzy, sleepy, or like you will faint.  You have any fluid discharge from the eye or your nose.  You have swelling and discoloration that does not fade. MAKE SURE YOU:   Understand these instructions.  Will watch your condition.  Will get help right away if you are not doing well or get worse. Document Released: 07/28/2000 Document Revised: 10/23/2011 Document Reviewed: 06/16/2011 [facility] Patient Information 2013 [place], [place]. Facial or Scalp Contusion A facial or scalp contusion is a deep bruise on the face or head. Injuries around the face and head generally cause a lot of swelling, especially around the eyes. Contusions are the result of an injury that caused bleeding under the skin. The contusion may turn blue, purple, or yellow. Minor injuries will give you a painless contusion, but more severe contusions may stay painful and swollen for a few weeks.  CAUSES  A facial or scalp contusion is caused by a blunt injury or trauma to the face or head area.  SYMPTOMS   Facial or scalp pain.  Facial, lip, or scalp swelling.  Facial or scalp  bruising or tenderness. You may have a mild headache, slight dizziness, nausea, and weakness for a few days. This usually clears up with bed rest and mild pain medicines.  DIAGNOSIS  The diagnosis can be made by asking about your history and doing a physical exam. An X-ray, computed tomography (CT) scan, or magnetic resonance imaging (MRI) may be needed to determine if there were any associated injuries, such as broken bones (fractures). TREATMENT  Often, the best treatment for a facial or scalp contusion is applying cold compresses to the injured area and eating a  soft diet. Over-the-counter medicines may also be recommended for pain control.  HOME CARE INSTRUCTIONS   Put ice on the injured area.  Put ice in a plastic bag.  Place a towel between your skin and the bag.  Leave the ice on for 15 to 20 minutes, 3 to 4 times a day.  Only take over-the-counter or prescription medicines for pain, discomfort, or fever as directed by your caregiver. SEEK IMMEDIATE MEDICAL CARE IF:  You have severe pain or a headache that is not relieved by medicine.  You have unusual sleepiness, confusion, or personality changes.  You vomit.  You have a persistent nosebleed.  You have double vision or blurred vision.  You have fluid drainage from your nose or ear.  You have difficulty walking or using your arms or legs.  You have bite problems.  You have pain with chewing.  You are concerned about facial defects. MAKE SURE YOU:   Understand these instructions.  Will watch your condition.  Will get help right away if you are not doing well or get worse. Document Released: 09/07/2004 Document Revised: 10/23/2011 Document Reviewed: 06/16/2011 [facility] Patient Information 2013 [place], [place].

## 2012-08-31 ENCOUNTER — Encounter: Payer: Self-pay | Admitting: Internal Medicine

## 2012-09-02 ENCOUNTER — Telehealth: Payer: Self-pay | Admitting: Internal Medicine

## 2012-09-02 NOTE — Telephone Encounter (Signed)
Patient Information:  Caller Name: Delailah  Phone: [phone]  Patient: Emily Sweeney, Emily Sweeney  Gender: Female  DOB: 01-Aug-1941  Age: 72 Years  PCP: Sanda Linger (Adults only)  Office Follow Up:  Does the office need to follow up with this patient?: No  Instructions For The Office: N/A  RN Note:  pt also reports blood tinged fluid dripping from nose.  Pt states that has increased today. Pt also reports black eyes.  Symptoms  Reason For Call & Symptoms: pt reported that she tripped on front walk.  Pt fell on right side of face.  Pt was seen at Urgent Care that day and also by opthalmalogist that day also.  Pt had no fractures but did have eye contusion and face contusion.  Opthamalogist said that pt could have some cranial nerve damage due to numbness in the right side of face.  Pt is still having fluid from eye  Reviewed Health History In EMR: Yes  Reviewed Medications In EMR: Yes  Reviewed Allergies In EMR: Yes  Reviewed Surgeries / Procedures: Yes  Date of Onset of Symptoms: 08/29/2012  Treatments Tried: ice, elevation, aleve  Treatments Tried Worked: No  Guideline(s) Used:  Head Injury  Disposition Per Guideline:   Go to ED Now  Reason For Disposition Reached:   Watery or blood-tinged fluid dripping from the nose or ears  Advice Given:  N/A  Patient Refused Recommendation:  Patient Will Follow Up With Office Later  pt did not want to go to the ED; pt states she has been evaluated and doesnt feel it is an emergency  RN did make pt appt for 09/03/12 at 0845 with Dr Felicity Coyer (Dr Yetta Barre was unavailable)

## 2012-09-03 ENCOUNTER — Telehealth: Payer: Self-pay | Admitting: *Deleted

## 2012-09-03 ENCOUNTER — Ambulatory Visit (INDEPENDENT_AMBULATORY_CARE_PROVIDER_SITE_OTHER): Payer: Medicare Other | Admitting: Internal Medicine

## 2012-09-03 ENCOUNTER — Encounter: Payer: Self-pay | Admitting: Internal Medicine

## 2012-09-03 ENCOUNTER — Ambulatory Visit (INDEPENDENT_AMBULATORY_CARE_PROVIDER_SITE_OTHER)
Admission: RE | Admit: 2012-09-03 | Discharge: 2012-09-03 | Disposition: A | Discharge status: Home / Self Care | Payer: Medicare Other | Source: Ambulatory Visit | Attending: Internal Medicine | Admitting: Internal Medicine

## 2012-09-03 VITALS — BP 118/70 | HR 88 | Temp 97.5°F | Wt 147.1 lb

## 2012-09-03 DIAGNOSIS — S0230XA Fracture of orbital floor, unspecified side, initial encounter for closed fracture: Secondary | ICD-10-CM

## 2012-09-03 DIAGNOSIS — I10 Essential (primary) hypertension: Secondary | ICD-10-CM

## 2012-09-03 DIAGNOSIS — R200 Anesthesia of skin: Secondary | ICD-10-CM

## 2012-09-03 DIAGNOSIS — S0993XA Unspecified injury of face, initial encounter: Secondary | ICD-10-CM | POA: Diagnosis not present

## 2012-09-03 DIAGNOSIS — R29810 Facial weakness: Secondary | ICD-10-CM

## 2012-09-03 DIAGNOSIS — R209 Unspecified disturbances of skin sensation: Secondary | ICD-10-CM

## 2012-09-03 DIAGNOSIS — S199XXA Unspecified injury of neck, initial encounter: Secondary | ICD-10-CM | POA: Diagnosis not present

## 2012-09-03 NOTE — Addendum Note (Signed)
Addended by: Rene Paci A on: 09/03/2012 10:54 AM   Modules accepted: Orders

## 2012-09-03 NOTE — Addendum Note (Signed)
Addended by: Rene Paci A on: 09/03/2012 12:26 PM   Modules accepted: Orders

## 2012-09-03 NOTE — Patient Instructions (Signed)
It was good to see you today. We have reviewed your prior records including labs and tests today Test(s) ordered today - CT face today. Your results will be released to MyChart (or called to you) after review, usually within 72hours after test completion. If any changes need to be made, you will be notified at that same time. Medications reviewed, no changes at this time.

## 2012-09-03 NOTE — Progress Notes (Addendum)
Subjective:    Patient ID: Emily Sweeney, female    DOB: 12-30-1940, 72 y.o.   MRN: [id number]  HPI  Here for follow up - seen UC 08/29/12 s/p fall - injury to R side of face Dx with eye contusion and facial contusion follow up with optho Dr. Randon Goldsmith : feels possible fx given R facial numbness and mild upper R lip droop Notes trace pink tinged clear liquid draining from R nostril and PND Besides numbness, no significant pain Vision intact - no eye injury per optho  Past Medical History  Diagnosis Date  . Anemia   . Diverticulosis of colon   . Hyperlipidemia   . Hypertension   . Hearing loss     right ear, and tinnitus   Review of Systems  Constitutional: Negative for fever, fatigue and unexpected weight change.  Neurological: Positive for facial asymmetry and numbness. Negative for dizziness, syncope, weakness and headaches.       Objective:   Physical Exam BP 118/70  Pulse 88  Temp 97.5 F (36.4 C) (Oral)  Wt 147 lb 1.9 oz (66.733 kg)  SpO2 99% Weight: 147 lb 1.9 oz (66.733 kg)  Constitutional: She appears well-developed and well-nourished. No distress.  HENT: Head: soft tissue swelling and bruising R side of face - orbital down nasolabial fold down chin onto anterior neck. No laceration or ulceration. Mod pain to palpation over r cheek bone at site of impact. Ears: B TMs ok, no erythema or effusion; Nose: Nose normal - no lesion or current drainage. Mouth/Throat: Oropharynx is clear and moist. No oropharyngeal exudate. no laceration Eyes: sclera red with conjunctival hemorrhage on R. B EOMI are normal. Pupils are equal, round, and reactive to light. No scleral icterus.  Neck: Normal range of motion. Neck supple. No JVD present. No thyromegaly present.  Cardiovascular: Normal rate, regular rhythm and normal heart sounds.  No murmur heard. No BLE edema. Pulmonary/Chest: Effort normal and breath sounds normal. No respiratory distress. She has no wheezes.  Neurological: She  is alert and oriented to person, place, and time. No cranial nerve deficit. Coordination normal.  Skin: See above - remaining skin is warm and dry. No rash noted. No erythema.  Psychiatric: She has a normal mood and affect. Her behavior is normal. Judgment and thought content normal.   Lab Results  Component Value Date   WBC 7.8 12/18/2011   HGB 13.9 12/18/2011   HCT 41.9 12/18/2011   PLT 230.0 12/18/2011   GLUCOSE 90 12/18/2011   CHOL 190 12/18/2011   TRIG 95.0 12/18/2011   HDL 82.90 12/18/2011   LDLCALC 88 12/18/2011   ALT 15 12/18/2011   AST 23 12/18/2011   NA 142 12/18/2011   K 3.9 12/18/2011   CL 104 12/18/2011   CREATININE 0.7 12/18/2011   BUN 19 12/18/2011   CO2 26 12/18/2011   TSH 1.67 12/18/2011   HGBA1C 6.0 12/18/2011   DG orbit 08/29/12: no fracture       Assessment & Plan:   Facial trauma 08/29/12 - no fracture evident on DG at time of injury However persisting R facial numbness and R upper lip droop (motor deficits) - suspicious for CN injury Also blood tinges clear nasal drainage from R nostril, not evident today but ?CNS leak  Check CT facial rule out fracture, consider ENT eval as needed Pain controlled with Aleve   Addendum - call from radiology -Depressed fx of r orbital floor, 6mm step off - maxifacial surg consult recommended  Addendum: maxiofacial surg not accepting pt insurance - will refer to ENT - to see Dr Ezzard Standing this afternoon

## 2012-09-03 NOTE — Telephone Encounter (Signed)
Emily Sweeney contacted regarding maxillofacial CT results( 6mm depressed orbital fracture). Pt informed of results and need for more follow-up with a maxillofacial specialist/surgeon for close observation or any necessary surgical intervention.  [name] to call with MD info and appt time.  Pt reported no questions/concerns at this time.

## 2012-09-03 NOTE — Assessment & Plan Note (Signed)
BP Readings from Last 3 Encounters:  09/03/12 118/70  08/29/12 162/88  12/18/11 130/82   The current medical regimen is effective;  continue present plan and medications.

## 2012-09-12 LAB — HM DIABETES EYE EXAM: HM Diabetic Eye Exam: NORMAL

## 2012-09-17 DIAGNOSIS — S0230XA Fracture of orbital floor, unspecified side, initial encounter for closed fracture: Secondary | ICD-10-CM | POA: Diagnosis not present

## 2012-10-20 ENCOUNTER — Ambulatory Visit (INDEPENDENT_AMBULATORY_CARE_PROVIDER_SITE_OTHER): Payer: Medicare Other | Admitting: Family Medicine

## 2012-10-20 VITALS — BP 140/80 | HR 107 | Temp 98.1°F | Resp 17 | Ht 64.0 in | Wt 146.0 lb

## 2012-10-20 DIAGNOSIS — J040 Acute laryngitis: Secondary | ICD-10-CM | POA: Diagnosis not present

## 2012-10-20 DIAGNOSIS — J029 Acute pharyngitis, unspecified: Secondary | ICD-10-CM

## 2012-10-20 MED ORDER — AZITHROMYCIN 250 MG PO TABS: ORAL_TABLET | ORAL | Status: DC

## 2012-10-20 MED ORDER — HYDROCOD POLST-CHLORPHEN POLST 10-8 MG/5ML PO LQCR: 5.0000 mL | Freq: Two times a day (BID) | ORAL | Status: DC | PRN

## 2012-10-20 NOTE — Patient Instructions (Addendum)
Laryngitis At the top of your windpipe is your voice box. It is the source of your voice. Inside your voice box are 2 bands of muscles called vocal cords. When you breathe, your vocal cords are relaxed and open so that air can get into the lungs. When you decide to say something, these cords come together and vibrate. The sound from these vibrations goes into your throat and comes out through your mouth as sound. Laryngitis is an inflammation of the vocal cords that causes hoarseness, cough, loss of voice, sore throat, and dry throat. Laryngitis can be temporary (acute) or long-term (chronic). Most cases of acute laryngitis improve with time.Chronic laryngitis lasts for more than 3 weeks. CAUSES Laryngitis can often be related to excessive smoking, talking, or yelling, as well as inhalation of toxic fumes and allergies. Acute laryngitis is usually caused by a viral infection, vocal strain, measles or mumps, or bacterial infections. Chronic laryngitis is usually caused by vocal cord strain, vocal cord injury, postnasal drip, growths on the vocal cords, or acid reflux. SYMPTOMS   Cough.  Sore throat.  Dry throat. RISK FACTORS  Respiratory infections.  Exposure to irritating substances, such as cigarette smoke, excessive amounts of alcohol, stomach acids, and workplace chemicals.  Voice trauma, such as vocal cord injury from shouting or speaking too loud. DIAGNOSIS  Your cargiver will perform a physical exam. During the physical exam, your caregiver will examine your throat. The most common sign of laryngitis is hoarseness. Laryngoscopy may be necessary to confirm the diagnosis of this condition. This procedure allows your caregiver to look into the larynx. HOME CARE INSTRUCTIONS  Drink enough fluids to keep your urine clear or pale yellow.  Rest until you no longer have symptoms or as directed by your caregiver.  Breathe in moist air.  Take all medicine as directed by your  caregiver.  Do not smoke.  Talk as little as possible (this includes whispering).  Write on paper instead of talking until your voice is back to normal.  Follow up with your caregiver if your condition has not improved after 10 days. SEEK MEDICAL CARE IF:   You have trouble breathing.  You cough up blood.  You have persistent fever.  You have increasing pain.  You have difficulty swallowing. MAKE SURE YOU:  Understand these instructions.  Will watch your condition.  Will get help right away if you are not doing well or get worse. Document Released: 07/31/2005 Document Revised: 10/23/2011 Document Reviewed: 10/06/2010 [facility] Patient Information 2013 [place], [place].   Viral Pharyngitis Viral pharyngitis is a viral infection that produces redness, pain, and swelling (inflammation) of the throat. It can spread from person to person (contagious). CAUSES Viral pharyngitis is caused by inhaling a large amount of certain germs called viruses. Many different viruses cause viral pharyngitis. SYMPTOMS Symptoms of viral pharyngitis include:  Sore throat.  Tiredness.  Stuffy nose.  Low-grade fever.  Congestion.  Cough. TREATMENT Treatment includes rest, drinking plenty of fluids, and the use of over-the-counter medication (approved by your caregiver). HOME CARE INSTRUCTIONS   Drink enough fluids to keep your urine clear or pale yellow.  Eat soft, cold foods such as ice cream, frozen ice pops, or gelatin dessert.  Gargle with warm salt water (1 tsp salt per 1 qt of water).  If over age 67, throat lozenges may be used safely.  Only take over-the-counter or prescription medicines for pain, discomfort, or fever as directed by your caregiver. Do not take aspirin. To help  prevent spreading viral pharyngitis to others, avoid:  Mouth-to-mouth contact with others.  Sharing utensils for eating and drinking.  Coughing around others. SEEK MEDICAL CARE IF:   You are  better in a few days, then become worse.  You have a fever or pain not helped by pain medicines.  There are any other changes that concern you. Document Released: 05/10/2005 Document Revised: 10/23/2011 Document Reviewed: 10/06/2010 [facility] Patient Information 2013 [place], [place].

## 2012-10-20 NOTE — Progress Notes (Signed)
Subjective:    Patient ID: Emily Sweeney, female    DOB: 08-20-1940, 72 y.o.   MRN: [id number] Chief Complaint  Patient presents with  . Sore Throat  . Cough  . Chills    HPI  Started today very hard to swallow, sore throat, laryngitis. Took robitussin and gargled with salt water.  Has had a temp up to 99.5.   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    Past Medical History  Diagnosis Date  . Anemia   . Diverticulosis of colon   . Hyperlipidemia   . Hypertension   . Hearing loss     right ear, and tinnitus   Current Outpatient Prescriptions on File Prior to Visit  Medication Sig Dispense Refill  . amLODipine (NORVASC) 10 MG tablet Take 1/2 tablet by mouth  daily  45 tablet  0  . ramipril (ALTACE) 10 MG capsule TAKE (1) CAPSULE DAILY.  90 capsule  1  . atorvastatin (LIPITOR) 20 MG tablet Take 1 tablet (20 mg total) by mouth daily.  90 tablet  3   No current facility-administered medications on file prior to visit.   Allergies  Allergen Reactions  . Ceftin (Cefuroxime Axetil)   . Ciprofloxacin Rash  . Penicillins Hives and Rash  . Sulfonamide Derivatives Hives and Rash      Review of Systems  Constitutional: Positive for fever, chills, diaphoresis, activity change, appetite change and fatigue. Negative for unexpected weight change.  HENT: Positive for sore throat, trouble swallowing, neck pain and voice change. Negative for ear pain, nosebleeds, congestion, rhinorrhea, sneezing, drooling, mouth sores, neck stiffness, postnasal drip, sinus pressure and ear discharge.   Eyes: Negative for pain and itching.  Respiratory: Positive for cough. Negative for shortness of breath.   Cardiovascular: Negative for chest pain.  Gastrointestinal: Negative for nausea, vomiting, abdominal pain, diarrhea and constipation.  Genitourinary: Negative for dysuria.  Musculoskeletal:  Positive for myalgias. Negative for joint swelling, arthralgias and gait problem.  Neurological: Positive for headaches. Negative for dizziness and syncope.  Hematological: Positive for adenopathy.  Psychiatric/Behavioral: Positive for sleep disturbance.      BP 140/80  Pulse 107  Temp(Src) 98.1 F (36.7 C)  Resp 17  Ht 5\' 4"  (1.626 m)  Wt 146 lb (66.225 kg)  BMI 25.05 kg/m2  SpO2 96% Objective:   Physical Exam  Constitutional: She is oriented to person, place, and time. She appears well-developed and well-nourished. No distress.  HENT:  Head: Normocephalic and atraumatic. No trismus in the jaw.  Right Ear: Tympanic membrane, external ear and ear canal normal.  Left Ear: Tympanic membrane, external ear and ear canal normal.  Nose: Mucosal edema present. No rhinorrhea.  Mouth/Throat: Uvula is midline and mucous membranes are normal. Mucous membranes are not pale and not dry. No edematous. Posterior oropharyngeal edema and posterior oropharyngeal erythema present. No oropharyngeal exudate or tonsillar abscesses.  S/p tonsillectomy  Eyes: Conjunctivae are normal. Right eye exhibits no discharge. Left eye exhibits no discharge. No scleral icterus.  Neck: Normal range of motion. Neck supple.  Cardiovascular: Normal rate, regular rhythm, normal heart sounds and intact distal pulses.   Pulmonary/Chest: Effort normal and breath sounds normal. No respiratory distress.  Lymphadenopathy:       Head (right side): Submandibular and tonsillar adenopathy present. No preauricular, no posterior auricular and no occipital adenopathy present.  Head (left side): Submandibular and tonsillar adenopathy present. No preauricular, no posterior auricular and no occipital adenopathy present.    She has cervical adenopathy.       Right cervical: Superficial cervical adenopathy present. No posterior cervical adenopathy present.      Left cervical: Superficial cervical adenopathy present. No posterior  cervical adenopathy present.       Right: No supraclavicular adenopathy present.       Left: No supraclavicular adenopathy present.  Neurological: She is alert and oriented to person, place, and time.  Skin: Skin is warm and dry. She is not diaphoretic. No erythema.  Psychiatric: She has a normal mood and affect. Her behavior is normal.      Assessment & Plan:  Pharyngitis, acute - reminded pt that likely viral, cont salt water gargles and hot tea but gave abx rx to hold on to in case sxs worsen. Mult abx allergies so per pt request will use azithro.  Laryngitis, acute  Meds ordered this encounter  Medications  . chlorpheniramine-HYDROcodone (TUSSIONEX PENNKINETIC ER) 10-8 MG/5ML LQCR    Sig: Take 5 mLs by mouth every 12 (twelve) hours as needed (cough and pain).    Dispense:  140 mL    Refill:  0  . azithromycin (ZITHROMAX) 250 MG tablet    Sig: Take 2 tabs PO x 1 dose, then 1 tab PO QD x 4 days    Dispense:  6 tablet    Refill:  0

## 2012-11-12 ENCOUNTER — Other Ambulatory Visit: Payer: Self-pay

## 2012-11-12 MED ORDER — ATORVASTATIN CALCIUM 20 MG PO TABS: 20.0000 mg | ORAL_TABLET | Freq: Every day | ORAL | Status: DC

## 2012-12-05 DIAGNOSIS — H2510 Age-related nuclear cataract, unspecified eye: Secondary | ICD-10-CM | POA: Diagnosis not present

## 2012-12-05 DIAGNOSIS — H02839 Dermatochalasis of unspecified eye, unspecified eyelid: Secondary | ICD-10-CM | POA: Diagnosis not present

## 2012-12-05 DIAGNOSIS — S0230XA Fracture of orbital floor, unspecified side, initial encounter for closed fracture: Secondary | ICD-10-CM | POA: Diagnosis not present

## 2012-12-05 DIAGNOSIS — H52209 Unspecified astigmatism, unspecified eye: Secondary | ICD-10-CM | POA: Diagnosis not present

## 2012-12-17 ENCOUNTER — Telehealth: Payer: Self-pay | Admitting: *Deleted

## 2012-12-17 MED ORDER — AMLODIPINE BESYLATE 10 MG PO TABS: ORAL_TABLET | ORAL | Status: DC

## 2012-12-17 NOTE — Telephone Encounter (Signed)
Pt requesting refill of Amlodipine to be sent to [facility]

## 2012-12-23 ENCOUNTER — Ambulatory Visit (INDEPENDENT_AMBULATORY_CARE_PROVIDER_SITE_OTHER): Payer: Medicare Other | Admitting: Internal Medicine

## 2012-12-23 ENCOUNTER — Encounter: Payer: Self-pay | Admitting: Internal Medicine

## 2012-12-23 VITALS — BP 122/70 | HR 84 | Temp 98.1°F | Ht 64.0 in | Wt 145.0 lb

## 2012-12-23 DIAGNOSIS — M6580 Other synovitis and tenosynovitis, unspecified site: Secondary | ICD-10-CM

## 2012-12-23 DIAGNOSIS — M76899 Other specified enthesopathies of unspecified lower limb, excluding foot: Secondary | ICD-10-CM

## 2012-12-23 MED ORDER — PREDNISONE 10 MG PO TABS: ORAL_TABLET | ORAL | Status: DC

## 2012-12-23 NOTE — Progress Notes (Signed)
Subjective:    Patient ID: Emily Sweeney, female    DOB: Jul 17, 1941, 72 y.o.   MRN: [id number]  HPI  Pt presents to the clinic today with c/o pain in her tight thigh. This started 2 days ago. She describes the pain as sharp and constant. She has not had an injury to the area that she is aware of. The pain runs down into the right side of the back of her knee. The pain is worse when she tries to stand up or walks up stairs. She has been taking Aleve which helps ease the pain off. She has never had pain in her thigh like this before. She denies back pain.  Review of Systems      Past Medical History  Diagnosis Date  . Anemia   . Diverticulosis of colon   . Hyperlipidemia   . Hypertension   . Hearing loss     right ear, and tinnitus    Current Outpatient Prescriptions  Medication Sig Dispense Refill  . amLODipine (NORVASC) 10 MG tablet Take 1/2 tablet by mouth  daily  15 tablet  3  . atorvastatin (LIPITOR) 20 MG tablet Take 1 tablet (20 mg total) by mouth daily.  90 tablet  0  . ramipril (ALTACE) 10 MG capsule TAKE (1) CAPSULE DAILY.  90 capsule  1  . predniSONE (DELTASONE) 10 MG tablet Take 3 tablets on days 1-2, take 2 tablets on days 3-4, take 1 tablet on days 5-6  12 tablet  0   No current facility-administered medications for this visit.    Allergies  Allergen Reactions  . Ceftin (Cefuroxime Axetil)   . Ciprofloxacin Rash  . Penicillins Hives and Rash  . Sulfonamide Derivatives Hives and Rash    Family History  Problem Relation Age of Onset  . Sudden death Father 7    MI  . Coronary artery disease Father   . Dementia Mother 49    History   Social History  . Marital Status: Legally Separated    Spouse Name: N/A    Number of Children: N/A  . Years of Education: N/A   Occupational History  . psychologist    Social History Main Topics  . Smoking status: Former Smoker    Quit date: 07/04/1991  . Smokeless tobacco: Never Used  . Alcohol Use: No  . Drug  Use: No  . Sexually Active: Not Currently   Other Topics Concern  . Not on file   Social History Narrative  . No narrative on file     Constitutional: Denies fever, malaise, fatigue, headache or abrupt weight changes.  Musculoskeletal: Pt reports sharp pains in right thigh.Denies decrease in range of motion, difficulty with gait, muscle pain or joint pain and swelling.  Skin: Denies redness, rashes, lesions or ulcercations.  Neurological: Denies dizziness, difficulty with memory, difficulty with speech or problems with balance and coordination.   No other specific complaints in a complete review of systems (except as listed in HPI above).  Objective:   Physical Exam  BP 122/70  Pulse 84  Temp(Src) 98.1 F (36.7 C) (Oral)  Ht 5\' 4"  (1.626 m)  Wt 145 lb (65.772 kg)  BMI 24.88 kg/m2  SpO2 97% Wt Readings from Last 3 Encounters:  12/23/12 145 lb (65.772 kg)  10/20/12 146 lb (66.225 kg)  09/03/12 147 lb 1.9 oz (66.733 kg)    General: Appears her stated age, well developed, well nourished in NAD. Skin: Warm, dry and intact. No rashes,  lesions or ulcerations noted. Cardiovascular: Normal rate and rhythm. S1,S2 noted.  No murmur, rubs or gallops noted. No JVD or BLE edema. No carotid bruits noted. Pulmonary/Chest: Normal effort and positive vesicular breath sounds. No respiratory distress. No wheezes, rales or ronchi noted. . Musculoskeletal: Normal range of motion. No signs of joint swelling. Tenderness noted along the quadriceps tendon. Mild limp secondary to pain. Neurological: Alert and oriented. Cranial nerves II-XII intact. Coordination normal. +DTRs bilaterally.        Assessment & Plan:   Quadriceps tendonitis of right leg, new onset:  Can continue Aleve and icy hot rub eRx for pred taper Avoid any continuous motions of right leg over the next few days  RTC as needed or if pain persist or worsens.

## 2012-12-23 NOTE — Patient Instructions (Signed)
Tendinitis Tendinitis is swelling and inflammation of the tendons. Tendons are band-like tissues that connect muscle to bone. Tendinitis commonly occurs in the:   Shoulders (rotator cuff).  Heels (Achilles tendon).  Elbows (triceps tendon). CAUSES Tendinitis is usually caused by overusing the tendon, muscles, and joints involved. When the tissue surrounding a tendon (synovium) becomes inflamed, it is called tenosynovitis. Tendinitis commonly develops in people whose jobs require repetitive motions. SYMPTOMS  Pain.  Tenderness.  Mild swelling. DIAGNOSIS Tendinitis is usually diagnosed by physical exam. Your caregiver may also order X-rays or other imaging tests. TREATMENT Your caregiver may recommend certain medicines or exercises for your treatment. HOME CARE INSTRUCTIONS   Use a sling or splint for as long as directed by your caregiver until the pain decreases.  Put ice on the injured area.  Put ice in a plastic bag.  Place a towel between your skin and the bag.  Leave the ice on for 15 to 20 minutes, 3 to 4 times a day.  Avoid using the limb while the tendon is painful. Perform gentle range of motion exercises only as directed by your caregiver. Stop exercises if pain or discomfort increase, unless directed otherwise by your caregiver.  Only take over-the-counter or prescription medicines for pain, discomfort, or fever as directed by your caregiver. SEEK MEDICAL CARE IF:   Your pain and swelling increase.  You develop new, unexplained symptoms, especially increased numbness in the hands. MAKE SURE YOU:   Understand these instructions.  Will watch your condition.  Will get help right away if you are not doing well or get worse. Document Released: 07/28/2000 Document Revised: 10/23/2011 Document Reviewed: 10/17/2010 [employer] Patient Information 2013 [employer].  

## 2013-01-02 ENCOUNTER — Ambulatory Visit (INDEPENDENT_AMBULATORY_CARE_PROVIDER_SITE_OTHER)
Admission: RE | Admit: 2013-01-02 | Discharge: 2013-01-02 | Disposition: A | Discharge status: Home / Self Care | Payer: Medicare Other | Source: Ambulatory Visit | Attending: Internal Medicine | Admitting: Internal Medicine

## 2013-01-02 ENCOUNTER — Ambulatory Visit (INDEPENDENT_AMBULATORY_CARE_PROVIDER_SITE_OTHER): Payer: Medicare Other | Admitting: Internal Medicine

## 2013-01-02 ENCOUNTER — Encounter: Payer: Self-pay | Admitting: Internal Medicine

## 2013-01-02 VITALS — BP 148/80 | HR 100 | Temp 98.2°F | Ht 66.0 in | Wt 147.5 lb

## 2013-01-02 DIAGNOSIS — IMO0001 Reserved for inherently not codable concepts without codable children: Secondary | ICD-10-CM | POA: Diagnosis not present

## 2013-01-02 DIAGNOSIS — M169 Osteoarthritis of hip, unspecified: Secondary | ICD-10-CM | POA: Diagnosis not present

## 2013-01-02 DIAGNOSIS — M76899 Other specified enthesopathies of unspecified lower limb, excluding foot: Secondary | ICD-10-CM | POA: Diagnosis not present

## 2013-01-02 DIAGNOSIS — W57XXXA Bitten or stung by nonvenomous insect and other nonvenomous arthropods, initial encounter: Secondary | ICD-10-CM

## 2013-01-02 DIAGNOSIS — I10 Essential (primary) hypertension: Secondary | ICD-10-CM

## 2013-01-02 DIAGNOSIS — M7071 Other bursitis of hip, right hip: Secondary | ICD-10-CM

## 2013-01-02 MED ORDER — DOXYCYCLINE HYCLATE 100 MG PO TABS: 100.0000 mg | ORAL_TABLET | Freq: Two times a day (BID) | ORAL | Status: DC

## 2013-01-02 MED ORDER — TRAMADOL HCL 50 MG PO TABS: 50.0000 mg | ORAL_TABLET | Freq: Four times a day (QID) | ORAL | Status: DC | PRN

## 2013-01-02 NOTE — Assessment & Plan Note (Signed)
For film today, but suspect soft tissue inflammation, for pain control, refer orthopedic

## 2013-01-02 NOTE — Assessment & Plan Note (Signed)
Elevated likely situational, stable overall by history and exam, recent data reviewed with pt, and pt to continue medical treatment as before,  to f/u any worsening symptoms or concerns BP Readings from Last 3 Encounters:  01/02/13 148/80  12/23/12 122/70  10/20/12 140/80

## 2013-01-02 NOTE — Patient Instructions (Signed)
Please take all new medication as prescribed - the antibiotic, and the pain medication You will be contacted regarding the referral for: orthopedic for the right hip Please go to the XRAY Department in the Basement (go straight as you get off the elevator) for the x-ray testing You will be contacted by phone if any changes need to be made immediately.  Otherwise, you will receive a letter about your results with an explanation, but please check with MyChart first.   Thank you for enrolling in MyChart. Please follow the instructions below to securely access your online medical record. MyChart allows you to send messages to your doctor, view your test results, renew your prescriptions, schedule appointments, and more.

## 2013-01-02 NOTE — Assessment & Plan Note (Signed)
Mild to mod, for antibx course,  to f/u any worsening symptoms or concerns 

## 2013-01-02 NOTE — Progress Notes (Signed)
Subjective:    Patient ID: Emily Sweeney, female    DOB: [birth year], 72 y.o.   MRN: [id number]  HPI  Here to f/u, incidentally had tick bite to left periscapular area over the past weekend, now with 1-2 days noticed worsening red/swelling/tender at the site, without drainage.  BP at home has been < 140/90 consistently, states she feels may be elevted today due to pain.  C/o ongoing right lateral hip pain for which she was here last visit, some improved for a short while with oral steroid, then worse again now more than before about 7/10, worse to lie on right side at night, and radiates now to the knee, tends to be quite active and walks quite a bit every day.  Pt denies chest pain, increased sob or doe, wheezing, orthopnea, PND, increased LE swelling, palpitations, dizziness or syncope.   Pt denies polydipsia, polyuria. Pt denies new neurological symptoms such as new headache, or facial or extremity weakness or numbness Past Medical History  Diagnosis Date  . Anemia   . Diverticulosis of colon   . Hyperlipidemia   . Hypertension   . Hearing loss     right ear, and tinnitus   Past Surgical History  Procedure Laterality Date  . Craniectomy for excision of acoustic neuroma    . Appendectomy    . Breast lumpectomy    . Tonsillectomy      reports that she quit smoking about 21 years ago. She has never used smokeless tobacco. She reports that she does not drink alcohol or use illicit drugs. family history includes Coronary artery disease in her father; Dementia (age of onset: 62) in her mother; and Sudden death (age of onset: 27) in her father. Allergies  Allergen Reactions  . Ceftin (Cefuroxime Axetil)   . Ciprofloxacin Rash  . Penicillins Hives and Rash  . Sulfonamide Derivatives Hives and Rash   Current Outpatient Prescriptions on File Prior to Visit  Medication Sig Dispense Refill  . amLODipine (NORVASC) 10 MG tablet Take 1/2 tablet by mouth  daily  15 tablet  3  . atorvastatin  (LIPITOR) 20 MG tablet Take 1 tablet (20 mg total) by mouth daily.  90 tablet  0  . ramipril (ALTACE) 10 MG capsule TAKE (1) CAPSULE DAILY.  90 capsule  1   No current facility-administered medications on file prior to visit.   Review of Systems  Constitutional: Negative for unexpected weight change, or unusual diaphoresis  HENT: Negative for tinnitus.   Eyes: Negative for photophobia and visual disturbance.  Respiratory: Negative for choking and stridor.   Gastrointestinal: Negative for vomiting and blood in stool.  Genitourinary: Negative for hematuria and decreased urine volume.  Musculoskeletal: Negative for acute joint swelling Skin: Negative for color change and wound.  Neurological: Negative for tremors and numbness other than noted  Psychiatric/Behavioral: Negative for decreased concentration or  hyperactivity.       Objective:   Physical Exam BP 148/80  Pulse 100  Temp(Src) 98.2 F (36.8 C) (Oral)  Ht 5\' 6"  (1.676 m)  Wt 147 lb 8 oz (66.906 kg)  BMI 23.82 kg/m2  SpO2 96% VS noted,  Constitutional: Pt appears well-developed and well-nourished.  HENT: Head: NCAT.  Right Ear: External ear normal.  Left Ear: External ear normal.  Eyes: Conjunctivae and EOM are normal. Pupils are equal, round, and reactive to light.  Neck: Normal range of motion. Neck supple.  Cardiovascular: Normal rate and regular rhythm.   Pulmonary/Chest: Effort normal and  breath sounds normal.  Neurological: Pt is alert. Not confused , motor intact, gait ok Marked tender over right lateral greater trochanter, spine nontender Skin: Left periscapular with 1.5 cm area red/tender/swelling without fluctuance or drainage Psychiatric: Pt behavior is normal. Thought content normal.     Assessment & Plan:

## 2013-01-03 ENCOUNTER — Encounter: Payer: Self-pay | Admitting: Internal Medicine

## 2013-01-04 ENCOUNTER — Encounter: Payer: Self-pay | Admitting: Internal Medicine

## 2013-01-07 ENCOUNTER — Telehealth: Payer: Self-pay | Admitting: Internal Medicine

## 2013-01-07 ENCOUNTER — Encounter (HOSPITAL_COMMUNITY): Payer: Self-pay | Admitting: Emergency Medicine

## 2013-01-07 ENCOUNTER — Emergency Department (HOSPITAL_COMMUNITY)
Admission: EM | Admit: 2013-01-07 | Discharge: 2013-01-07 | Disposition: A | Discharge status: Home / Self Care | Payer: Medicare Other | Attending: Emergency Medicine | Admitting: Emergency Medicine

## 2013-01-07 ENCOUNTER — Emergency Department (HOSPITAL_COMMUNITY): Payer: Medicare Other

## 2013-01-07 DIAGNOSIS — IMO0002 Reserved for concepts with insufficient information to code with codable children: Secondary | ICD-10-CM | POA: Diagnosis not present

## 2013-01-07 DIAGNOSIS — H9319 Tinnitus, unspecified ear: Secondary | ICD-10-CM | POA: Insufficient documentation

## 2013-01-07 DIAGNOSIS — Z862 Personal history of diseases of the blood and blood-forming organs and certain disorders involving the immune mechanism: Secondary | ICD-10-CM | POA: Diagnosis not present

## 2013-01-07 DIAGNOSIS — M545 Low back pain, unspecified: Secondary | ICD-10-CM | POA: Diagnosis not present

## 2013-01-07 DIAGNOSIS — E785 Hyperlipidemia, unspecified: Secondary | ICD-10-CM | POA: Diagnosis not present

## 2013-01-07 DIAGNOSIS — M25551 Pain in right hip: Secondary | ICD-10-CM

## 2013-01-07 DIAGNOSIS — R6889 Other general symptoms and signs: Secondary | ICD-10-CM | POA: Diagnosis not present

## 2013-01-07 DIAGNOSIS — M25559 Pain in unspecified hip: Secondary | ICD-10-CM | POA: Insufficient documentation

## 2013-01-07 DIAGNOSIS — Z8719 Personal history of other diseases of the digestive system: Secondary | ICD-10-CM | POA: Insufficient documentation

## 2013-01-07 DIAGNOSIS — H902 Conductive hearing loss, unspecified: Secondary | ICD-10-CM | POA: Insufficient documentation

## 2013-01-07 DIAGNOSIS — Z79899 Other long term (current) drug therapy: Secondary | ICD-10-CM | POA: Insufficient documentation

## 2013-01-07 DIAGNOSIS — I10 Essential (primary) hypertension: Secondary | ICD-10-CM | POA: Diagnosis not present

## 2013-01-07 DIAGNOSIS — Z87891 Personal history of nicotine dependence: Secondary | ICD-10-CM | POA: Diagnosis not present

## 2013-01-07 LAB — CBC WITH DIFFERENTIAL/PLATELET
Basophils Absolute: 0 10*3/uL (ref 0.0–0.1)
Basophils Relative: 0 % (ref 0–1)
Eosinophils Absolute: 0 10*3/uL (ref 0.0–0.7)
Hemoglobin: 13.6 g/dL (ref 12.0–15.0)
MCH: 31.4 pg (ref 26.0–34.0)
MCHC: 34.3 g/dL (ref 30.0–36.0)
Monocytes Relative: 9 % (ref 3–12)
Neutro Abs: 6.9 10*3/uL (ref 1.7–7.7)
Neutrophils Relative %: 77 % (ref 43–77)
Platelets: 206 10*3/uL (ref 150–400)

## 2013-01-07 LAB — BASIC METABOLIC PANEL
Chloride: 99 mEq/L (ref 96–112)
GFR calc Af Amer: >90 mL/min (ref >90)
GFR calc non Af Amer: 89 mL/min — ABNORMAL LOW (ref >90)
Potassium: 4.3 mEq/L (ref 3.5–5.1)
Sodium: 135 mEq/L (ref 135–145)

## 2013-01-07 MED ORDER — PREDNISONE 20 MG PO TABS: ORAL_TABLET | ORAL | Status: DC

## 2013-01-07 MED ORDER — HYDROCODONE-ACETAMINOPHEN 5-325 MG PO TABS: 1.0000 | ORAL_TABLET | Freq: Four times a day (QID) | ORAL | Status: DC | PRN

## 2013-01-07 MED ORDER — HYDROCODONE-ACETAMINOPHEN 5-325 MG PO TABS
2.0000 | ORAL_TABLET | Freq: Once | ORAL | Status: AC
  Administered 2013-01-07: 2 via ORAL
  Filled 2013-01-07: qty 2

## 2013-01-07 NOTE — Telephone Encounter (Signed)
Pt with increased pain by phone call this am  Best I can do is ask for ortho referral 'urgent"  She may need to go to ER or UC if unable to walk, and higher risk of fall

## 2013-01-07 NOTE — ED Notes (Signed)
Bed:WA13<BR> Expected date:<BR> Expected time:<BR> Means of arrival:<BR> Comments:<BR> EMS

## 2013-01-07 NOTE — Telephone Encounter (Signed)
Called the patient informed of MD instructions and she stated she was going to go ahead to the ER.

## 2013-01-07 NOTE — ED Notes (Signed)
Pt from home.  Pt reports R hip pain for past 2 weeks.  Pt states she has been to PCP 2x last week.  Had x rays done at PCP and were negative.  Told to come here if pain got worse.  Pt states she is able to ambulate, but it hurts.  Ptar states they had to help her on to stretcher.

## 2013-01-07 NOTE — ED Provider Notes (Signed)
History     CSN: [id number]  Arrival date & time 01/07/13  1338   First MD Initiated Contact with Patient 01/07/13 1341      Chief Complaint  Patient presents with  . Hip Pain    (Consider location/radiation/quality/duration/timing/severity/associated sxs/prior treatment) Patient is a 72 y.o. female presenting with hip pain. The history is provided by the patient.  Hip Pain Pertinent negatives include no chest pain, no abdominal pain and no shortness of breath.  pt c/o right hip pain for past 2+ weeks. Constant. Dull. Non radiating. Worse w weight bearing, walking. No injury or fall. No leg swelling. Denies back pain. No knee pain or swelling. No claudication or calf pain. No fever or chills. Has seen pcp w same, told possible bursitis or tendonitis. States on visit last week to doctor had tick on back, no EM rash, no gen rash, no muscle or body aches, no fever/chills. pcp has referred to ortho. No acute or abrupt change or worsening of symptoms today, but  Symptoms havent resolved. No gi or gu  C/o. No hx ddd. No radicular pain down leg, feeling pain mainly in hip. Does not milder pain in area sciatic notch.     Past Medical History  Diagnosis Date  . Anemia   . Diverticulosis of colon   . Hyperlipidemia   . Hypertension   . Hearing loss     right ear, and tinnitus    Past Surgical History  Procedure Laterality Date  . Craniectomy for excision of acoustic neuroma    . Appendectomy    . Breast lumpectomy    . Tonsillectomy      Family History  Problem Relation Age of Onset  . Sudden death Father 63    MI  . Coronary artery disease Father   . Dementia Mother 84    History  Substance Use Topics  . Smoking status: Former Smoker    Quit date: 07/04/1991  . Smokeless tobacco: Never Used  . Alcohol Use: No    OB History   Grav Para Term Preterm Abortions TAB SAB Ect Mult Living                  Review of Systems  Constitutional: Negative for fever and chills.   Respiratory: Negative for shortness of breath.   Cardiovascular: Negative for chest pain and leg swelling.  Gastrointestinal: Negative for vomiting and abdominal pain.  Genitourinary: Negative for pelvic pain.  Musculoskeletal: Negative for back pain and joint swelling.  Skin: Negative for rash.  Neurological: Negative for weakness and numbness.    Allergies  Ceftin; Ciprofloxacin; Penicillins; and Sulfonamide derivatives  Home Medications   Current Outpatient Rx  Name  Route  Sig  Dispense  Refill  . amLODipine (NORVASC) 10 MG tablet   Oral   Take 5 mg by mouth daily. Take 1/2 tablet by mouth  daily         . atorvastatin (LIPITOR) 20 MG tablet   Oral   Take 1 tablet (20 mg total) by mouth daily.   90 tablet   0     PATIENT DUE FOR AN OFFICE VISIT BEFORE FURTHER REF ...   . doxycycline (VIBRA-TABS) 100 MG tablet   Oral   Take 1 tablet (100 mg total) by mouth 2 (two) times daily.   20 tablet   0   . hydroxypropyl methylcellulose (ISOPTO TEARS) 2.5 % ophthalmic solution   Both Eyes   Place 1 drop into both eyes  as needed (for dry eyes).         . naproxen sodium (ANAPROX) 220 MG tablet   Oral   Take 220 mg by mouth as needed (leg pain).         . ramipril (ALTACE) 10 MG capsule      TAKE (1) CAPSULE DAILY.   90 capsule   1   . traMADol (ULTRAM) 50 MG tablet   Oral   Take 1 tablet (50 mg total) by mouth every 6 (six) hours as needed for pain.   60 tablet   1     BP 150/82  Pulse 91  Temp(Src) 98.7 F (37.1 C) (Oral)  Resp 14  Ht 5\' 6"  (1.676 m)  Wt 146 lb (66.225 kg)  BMI 23.58 kg/m2  SpO2 100%  Physical Exam  Nursing note and vitals reviewed. Constitutional: She is oriented to person, place, and time. She appears well-developed and well-nourished. No distress.  Eyes: Conjunctivae are normal. No scleral icterus.  Neck: Neck supple. No tracheal deviation present.  Cardiovascular: Normal rate.   Pulmonary/Chest: Effort normal. No respiratory  distress.  Abdominal: Soft. Normal appearance. She exhibits no distension and no mass. There is no tenderness. There is no rebound and no guarding.  Genitourinary:  No cva tenderness  Musculoskeletal: She exhibits no edema and no tenderness.  Good passive rom at right hip and knee without pain. rle without sts or focal bony tenderness. No skin changes or rash. No shingles. Fem and distal pulses 2+.  Neurological: She is alert and oriented to person, place, and time. She displays normal reflexes.  Motor 5/5 rle. Straight leg raise neg.   Skin: Skin is warm and dry. No rash noted.  Psychiatric: She has a normal mood and affect.    ED Course  Procedures (including critical care time)  Results for orders placed during the hospital encounter of 01/07/13  CBC WITH DIFFERENTIAL      Result Value Range   WBC 9.0  4.0 - 10.5 K/uL   RBC 4.33  3.87 - 5.11 MIL/uL   Hemoglobin 13.6  12.0 - 15.0 g/dL   HCT 40.9  81.1 - 91.4 %   MCV 91.5  78.0 - 100.0 fL   MCH 31.4  26.0 - 34.0 pg   MCHC 34.3  30.0 - 36.0 g/dL   RDW 78.2  95.6 - 21.3 %   Platelets 206  150 - 400 K/uL   Neutrophils Relative % 77  43 - 77 %   Neutro Abs 6.9  1.7 - 7.7 K/uL   Lymphocytes Relative 13  12 - 46 %   Lymphs Abs 1.2  0.7 - 4.0 K/uL   Monocytes Relative 9  3 - 12 %   Monocytes Absolute 0.8  0.1 - 1.0 K/uL   Eosinophils Relative 0  0 - 5 %   Eosinophils Absolute 0.0  0.0 - 0.7 K/uL   Basophils Relative 0  0 - 1 %   Basophils Absolute 0.0  0.0 - 0.1 K/uL  BASIC METABOLIC PANEL      Result Value Range   Sodium 135  135 - 145 mEq/L   Potassium 4.3  3.5 - 5.1 mEq/L   Chloride 99  96 - 112 mEq/L   CO2 26  19 - 32 mEq/L   Glucose, Bld 141 (*) 70 - 99 mg/dL   BUN 11  6 - 23 mg/dL   Creatinine, Ser 0.86  0.50 - 1.10 mg/dL   Calcium  9.6  8.4 - 10.5 mg/dL   GFR calc non Af Amer 89 (*) >90 mL/min   GFR calc Af Amer >90  >90 mL/min   Dg Lumbar Spine Complete  01/07/2013   *RADIOLOGY REPORT*  Clinical Data: Pain, fall.   LUMBAR SPINE - COMPLETE 4+ VIEW  Comparison: None.  Findings: Leftward scoliosis in the mid and lower lumbar spine. Degenerative disc and facet disease throughout the lumbar spine. No fracture or acute subluxation.  SI joints are symmetric and unremarkable.  IMPRESSION: Scoliosis and degenerative changes.  No acute findings.   Original Report Authenticated By: Charlett Nose, M.D.   Dg Hip Complete Right  01/02/2013   *RADIOLOGY REPORT*  Clinical Data: 2-week history of right hip pain.  No recent injuries.  RIGHT HIP - COMPLETE 2+ VIEW  Comparison: None.  Findings: No evidence of acute or subacute fracture or dislocation. Joint space well preserved.  Bone mineral density well preserved.  Included AP pelvis demonstrates a normal-appearing contralateral left hip.  Sacroiliac joints and symphysis pubis intact.  Mild enthesopathic spurring involving the ischia bilaterally and the superior iliac spines.  Iliofemoral atherosclerosis.  IMPRESSION: No acute or significant osseous abnormality.   Original Report Authenticated By: Hulan Saas, M.D.   Ct Hip Right Wo Contrast  01/07/2013   *RADIOLOGY REPORT*  Clinical Data: Right hip and proximal thigh pain.  CT OF THE RIGHT HIP WITHOUT CONTRAST  Technique:  Multidetector CT imaging was performed according to the standard protocol. Multiplanar CT image reconstructions were also generated.  Comparison: Plain films right hip 01/02/2013.  Findings: The right hip is located.  No fracture is identified. No degenerative change is seen about the right hip.  No joint effusion is seen.  Visualized pelvic musculature is unremarkable.  No fluid collection or mass is identified.  Imaged intrapelvic contents demonstrate some atherosclerotic vascular disease but no acute or focal abnormality is identified.  IMPRESSION: No finding to explain the patient's hip pain.  Atherosclerotic vascular disease is noted.   Original Report Authenticated By: Holley Dexter, M.D.        MDM    Pt states did not drive, arrived via ems.  Hydrocodone for pain.   Reviewed recent plain films of hip, negative acute.  ?radicular pain from lower back, LS films ordered - degen changes.  Ct hip for acute injury/process negative.  On exam of leg, no sts, no skin changes or erythema, distal pulses palp. No shingles/dermatomal rash.  No hx fevers/chills during period pain.   No pain w passive rom hip or knee.  Pt appears stable for d/c.           Suzi Roots, MD 01/07/13 [id number]

## 2013-01-08 DIAGNOSIS — M79609 Pain in unspecified limb: Secondary | ICD-10-CM | POA: Diagnosis not present

## 2013-01-14 ENCOUNTER — Other Ambulatory Visit (HOSPITAL_COMMUNITY): Payer: Self-pay | Admitting: Sports Medicine

## 2013-01-14 ENCOUNTER — Other Ambulatory Visit: Payer: Self-pay | Admitting: Sports Medicine

## 2013-01-14 DIAGNOSIS — M545 Low back pain, unspecified: Secondary | ICD-10-CM

## 2013-01-14 DIAGNOSIS — M79604 Pain in right leg: Secondary | ICD-10-CM

## 2013-01-15 ENCOUNTER — Encounter: Payer: Self-pay | Admitting: Internal Medicine

## 2013-01-16 ENCOUNTER — Ambulatory Visit (HOSPITAL_COMMUNITY)
Admission: RE | Admit: 2013-01-16 | Discharge: 2013-01-16 | Disposition: A | Discharge status: Home / Self Care | Payer: Medicare Other | Source: Ambulatory Visit | Attending: Sports Medicine | Admitting: Sports Medicine

## 2013-01-16 DIAGNOSIS — Q762 Congenital spondylolisthesis: Secondary | ICD-10-CM | POA: Insufficient documentation

## 2013-01-16 DIAGNOSIS — M5126 Other intervertebral disc displacement, lumbar region: Secondary | ICD-10-CM | POA: Diagnosis not present

## 2013-01-16 DIAGNOSIS — M47817 Spondylosis without myelopathy or radiculopathy, lumbosacral region: Secondary | ICD-10-CM | POA: Diagnosis not present

## 2013-01-16 DIAGNOSIS — M4120 Other idiopathic scoliosis, site unspecified: Secondary | ICD-10-CM | POA: Insufficient documentation

## 2013-01-16 DIAGNOSIS — M79604 Pain in right leg: Secondary | ICD-10-CM

## 2013-01-16 DIAGNOSIS — M79609 Pain in unspecified limb: Secondary | ICD-10-CM | POA: Diagnosis not present

## 2013-01-22 DIAGNOSIS — IMO0002 Reserved for concepts with insufficient information to code with codable children: Secondary | ICD-10-CM | POA: Diagnosis not present

## 2013-02-04 ENCOUNTER — Encounter: Payer: Self-pay | Admitting: Gastroenterology

## 2013-02-07 DIAGNOSIS — M545 Low back pain, unspecified: Secondary | ICD-10-CM | POA: Diagnosis not present

## 2013-02-12 DIAGNOSIS — M79609 Pain in unspecified limb: Secondary | ICD-10-CM | POA: Diagnosis not present

## 2013-02-12 DIAGNOSIS — M545 Low back pain, unspecified: Secondary | ICD-10-CM | POA: Diagnosis not present

## 2013-02-12 DIAGNOSIS — IMO0002 Reserved for concepts with insufficient information to code with codable children: Secondary | ICD-10-CM | POA: Diagnosis not present

## 2013-02-17 DIAGNOSIS — M79609 Pain in unspecified limb: Secondary | ICD-10-CM | POA: Diagnosis not present

## 2013-02-17 DIAGNOSIS — M545 Low back pain, unspecified: Secondary | ICD-10-CM | POA: Diagnosis not present

## 2013-02-17 DIAGNOSIS — IMO0002 Reserved for concepts with insufficient information to code with codable children: Secondary | ICD-10-CM | POA: Diagnosis not present

## 2013-02-19 DIAGNOSIS — M79609 Pain in unspecified limb: Secondary | ICD-10-CM | POA: Diagnosis not present

## 2013-02-19 DIAGNOSIS — M545 Low back pain, unspecified: Secondary | ICD-10-CM | POA: Diagnosis not present

## 2013-02-19 DIAGNOSIS — IMO0002 Reserved for concepts with insufficient information to code with codable children: Secondary | ICD-10-CM | POA: Diagnosis not present

## 2013-02-20 DIAGNOSIS — IMO0002 Reserved for concepts with insufficient information to code with codable children: Secondary | ICD-10-CM | POA: Diagnosis not present

## 2013-02-27 DIAGNOSIS — M545 Low back pain, unspecified: Secondary | ICD-10-CM | POA: Diagnosis not present

## 2013-02-27 DIAGNOSIS — M79609 Pain in unspecified limb: Secondary | ICD-10-CM | POA: Diagnosis not present

## 2013-02-27 DIAGNOSIS — IMO0002 Reserved for concepts with insufficient information to code with codable children: Secondary | ICD-10-CM | POA: Diagnosis not present

## 2013-03-05 ENCOUNTER — Other Ambulatory Visit: Payer: Self-pay | Admitting: Internal Medicine

## 2013-03-10 ENCOUNTER — Other Ambulatory Visit: Payer: Self-pay | Admitting: Internal Medicine

## 2013-03-14 DIAGNOSIS — M545 Low back pain, unspecified: Secondary | ICD-10-CM | POA: Diagnosis not present

## 2013-04-20 ENCOUNTER — Other Ambulatory Visit: Payer: Self-pay | Admitting: Internal Medicine

## 2013-06-18 ENCOUNTER — Ambulatory Visit (INDEPENDENT_AMBULATORY_CARE_PROVIDER_SITE_OTHER): Payer: Medicare Other | Admitting: Internal Medicine

## 2013-06-18 ENCOUNTER — Encounter: Payer: Self-pay | Admitting: Internal Medicine

## 2013-06-18 VITALS — BP 136/78 | HR 72 | Temp 98.5°F | Resp 16 | Ht 66.0 in | Wt 143.0 lb

## 2013-06-18 DIAGNOSIS — I10 Essential (primary) hypertension: Secondary | ICD-10-CM | POA: Diagnosis not present

## 2013-06-18 DIAGNOSIS — E7800 Pure hypercholesterolemia, unspecified: Secondary | ICD-10-CM | POA: Diagnosis not present

## 2013-06-18 DIAGNOSIS — Z23 Encounter for immunization: Secondary | ICD-10-CM | POA: Diagnosis not present

## 2013-06-18 DIAGNOSIS — R7309 Other abnormal glucose: Secondary | ICD-10-CM

## 2013-06-18 NOTE — Progress Notes (Signed)
Pre-visit discussion using our clinic review tool. No additional management support is needed unless otherwise documented below in the visit note.  

## 2013-06-18 NOTE — Assessment & Plan Note (Signed)
I will check her A1C to see if she has developed DM2 

## 2013-06-18 NOTE — Progress Notes (Signed)
  Subjective:    Patient ID: Emily Sweeney, female    DOB: 1940-08-21, 72 y.o.   MRN: [id number]  Hypertension This is a chronic problem. The current episode started more than 1 year ago. The problem is unchanged. The problem is controlled. Pertinent negatives include no anxiety, blurred vision, chest pain, headaches, malaise/fatigue, neck pain, orthopnea, palpitations, peripheral edema, PND, shortness of breath or sweats. Past treatments include calcium channel blockers and ACE inhibitors. The current treatment provides moderate improvement. There are no compliance problems.       Review of Systems  Constitutional: Negative.  Negative for fever, chills, malaise/fatigue, diaphoresis, appetite change and fatigue.  HENT: Negative.   Eyes: Negative.  Negative for blurred vision.  Respiratory: Negative.  Negative for cough, choking, chest tightness, shortness of breath, wheezing and stridor.   Cardiovascular: Negative.  Negative for chest pain, palpitations, orthopnea, leg swelling and PND.  Gastrointestinal: Negative.  Negative for nausea, vomiting, abdominal pain, diarrhea, constipation and rectal pain.  Endocrine: Negative.   Genitourinary: Negative.   Musculoskeletal: Negative.  Negative for neck pain.  Skin: Negative.   Allergic/Immunologic: Negative.   Neurological: Negative.  Negative for dizziness, tremors, weakness, light-headedness, numbness and headaches.  Hematological: Negative.  Negative for adenopathy. Does not bruise/bleed easily.  Psychiatric/Behavioral: Negative.        Objective:   Physical Exam  Vitals reviewed. Constitutional: She is oriented to person, place, and time. She appears well-developed and well-nourished. No distress.  HENT:  Head: Normocephalic and atraumatic.  Mouth/Throat: Oropharynx is clear and moist. No oropharyngeal exudate.  Eyes: Conjunctivae are normal. Right eye exhibits no discharge. Left eye exhibits no discharge. No scleral icterus.   Neck: Normal range of motion. Neck supple. No JVD present. No tracheal deviation present. No thyromegaly present.  Cardiovascular: Normal rate, regular rhythm, normal heart sounds and intact distal pulses.  Exam reveals no gallop and no friction rub.   No murmur heard. Pulmonary/Chest: Effort normal and breath sounds normal. No stridor. No respiratory distress. She has no wheezes. She has no rales. She exhibits no tenderness.  Abdominal: Soft. Bowel sounds are normal. She exhibits no distension and no mass. There is no tenderness. There is no rebound and no guarding.  Musculoskeletal: Normal range of motion. She exhibits no edema and no tenderness.  Lymphadenopathy:    She has no cervical adenopathy.  Neurological: She is oriented to person, place, and time.  Skin: Skin is warm and dry. No rash noted. She is not diaphoretic. No erythema. No pallor.  Psychiatric: She has a normal mood and affect. Her behavior is normal. Judgment and thought content normal.     Lab Results  Component Value Date   WBC 9.0 01/07/2013   HGB 13.6 01/07/2013   HCT 39.6 01/07/2013   PLT 206 01/07/2013   GLUCOSE 141* 01/07/2013   CHOL 190 12/18/2011   TRIG 95.0 12/18/2011   HDL 82.90 12/18/2011   LDLCALC 88 12/18/2011   ALT 15 12/18/2011   AST 23 12/18/2011   NA 135 01/07/2013   K 4.3 01/07/2013   CL 99 01/07/2013   CREATININE 0.59 01/07/2013   BUN 11 01/07/2013   CO2 26 01/07/2013   TSH 1.67 12/18/2011   HGBA1C 6.0 12/18/2011       Assessment & Plan:

## 2013-06-18 NOTE — Assessment & Plan Note (Signed)
Her BP is well controlled Today I will check her lytes and renal function 

## 2013-06-18 NOTE — Patient Instructions (Signed)

## 2013-06-18 NOTE — Assessment & Plan Note (Signed)
She is doing well on lipitor I will check her FLP today 

## 2013-08-12 ENCOUNTER — Telehealth: Payer: Self-pay

## 2013-08-12 NOTE — Telephone Encounter (Signed)
A user error has taken place: encounter opened in error, closed for administrative reasons.

## 2013-09-24 ENCOUNTER — Other Ambulatory Visit: Payer: Self-pay | Admitting: Internal Medicine

## 2013-12-09 ENCOUNTER — Ambulatory Visit (INDEPENDENT_AMBULATORY_CARE_PROVIDER_SITE_OTHER): Payer: Medicare Other | Admitting: Family Medicine

## 2013-12-09 ENCOUNTER — Telehealth: Payer: Self-pay

## 2013-12-09 VITALS — BP 140/86 | HR 91 | Temp 98.3°F | Resp 16 | Ht 64.0 in | Wt 146.0 lb

## 2013-12-09 DIAGNOSIS — R300 Dysuria: Secondary | ICD-10-CM

## 2013-12-09 DIAGNOSIS — R42 Dizziness and giddiness: Secondary | ICD-10-CM

## 2013-12-09 DIAGNOSIS — H538 Other visual disturbances: Secondary | ICD-10-CM

## 2013-12-09 DIAGNOSIS — E162 Hypoglycemia, unspecified: Secondary | ICD-10-CM

## 2013-12-09 LAB — POCT URINALYSIS DIPSTICK
BILIRUBIN UA: NEGATIVE
Glucose, UA: NEGATIVE
KETONES UA: NEGATIVE
Nitrite, UA: NEGATIVE
PH UA: 7
Protein, UA: NEGATIVE
RBC UA: NEGATIVE
Spec Grav, UA: 1.01
Urobilinogen, UA: 0.2

## 2013-12-09 LAB — POCT CBC
GRANULOCYTE PERCENT: 67.8 % (ref 37–80)
HEMATOCRIT: 45.9 % (ref 37.7–47.9)
HEMOGLOBIN: 14.9 g/dL (ref 12.2–16.2)
Lymph, poc: 1.7 (ref 0.6–3.4)
MCH, POC: 31.3 pg — AB (ref 27–31.2)
MCHC: 32.5 g/dL (ref 31.8–35.4)
MCV: 96.4 fL (ref 80–97)
MID (cbc): 0.3 (ref 0–0.9)
MPV: 9.8 fL (ref 0–99.8)
POC GRANULOCYTE: 4.3 (ref 2–6.9)
POC LYMPH %: 27 % (ref 10–50)
POC MID %: 5.2 % (ref 0–12)
Platelet Count, POC: 215 10*3/uL (ref 142–424)
RBC: 4.76 M/uL (ref 4.04–5.48)
RDW, POC: 14.1 %
WBC: 6.3 10*3/uL (ref 4.6–10.2)

## 2013-12-09 LAB — POCT UA - MICROSCOPIC ONLY
BACTERIA, U MICROSCOPIC: NEGATIVE
CASTS, UR, LPF, POC: NEGATIVE
CRYSTALS, UR, HPF, POC: NEGATIVE
Epithelial cells, urine per micros: NEGATIVE
MUCUS UA: NEGATIVE
RBC, urine, microscopic: NEGATIVE
Yeast, UA: NEGATIVE

## 2013-12-09 LAB — GLUCOSE, POCT (MANUAL RESULT ENTRY): POC GLUCOSE: 108 mg/dL — AB (ref 70–99)

## 2013-12-09 MED ORDER — NITROFURANTOIN MONOHYD MACRO 100 MG PO CAPS: 100.0000 mg | ORAL_CAPSULE | Freq: Two times a day (BID) | ORAL | Status: DC

## 2013-12-09 NOTE — Progress Notes (Signed)
: Subjective: 73 year old lady who was in a meeting this morning and had a transient episode of feeling like her vision blurred out. It lasted maybe a minute. She persisted with a little bit of a dizziness sensation after that. It was not bad enough to interrupt her involvement in the meeting or to notify others of it. she is not dizzy now. She has had is several day history of mild upper respiratory infection or allergy-type symptoms with a little ear discomfort, nasal stuffiness, slight sore throat, slight cough. She's not been febrile or sick she has not have much of a history of springtime allergies. She does not smoke. She is not known to be diabetic her last hemoglobin A1c was 6.0. With the coughing she gets a little bit of chest tightness, but has not been having any chest pains. She has a BAHA which she's had for about 5 years. She has a little bit of unsteadiness in her gait that she attributes to that. Has had mild dysuria the last several days. She does have a history of urinary tract infections.  Objective: Healthy appearing lady. Her TMs are normal. Eyes have fairly constricted pupils in this bright room. Her throat is clear. Neck supple without significant nodes. No carotid bruits. Chest clear to auscultation. Heart regular without murmur.  Assessment: Transient visual blurring and dizziness Upper respiratory tract infection Dysuria  Plan: Glucose, CBC, urinalysis  Results for orders placed in visit on 12/09/13  POCT URINALYSIS DIPSTICK      Result Value Ref Range   Color, UA lt yellow     Clarity, UA clear     Glucose, UA neg     Bilirubin, UA neg     Ketones, UA neg     Spec Grav, UA 1.010     Blood, UA neg     pH, UA 7.0     Protein, UA neg     Urobilinogen, UA 0.2     Nitrite, UA neg     Leukocytes, UA small (1+)    POCT UA - MICROSCOPIC ONLY      Result Value Ref Range   WBC, Ur, HPF, POC 0-3     RBC, urine, microscopic neg     Bacteria, U Microscopic neg     Mucus, UA neg     Epithelial cells, urine per micros neg     Crystals, Ur, HPF, POC neg     Casts, Ur, LPF, POC neg     Yeast, UA neg    POCT CBC      Result Value Ref Range   WBC 6.3  4.6 - 10.2 K/uL   Lymph, poc 1.7  0.6 - 3.4   POC LYMPH PERCENT 27.0  10 - 50 %L   MID (cbc) 0.3  0 - 0.9   POC MID % 5.2  0 - 12 %M   POC Granulocyte 4.3  2 - 6.9   Granulocyte percent 67.8  37 - 80 %G   RBC 4.76  4.04 - 5.48 M/uL   Hemoglobin 14.9  12.2 - 16.2 g/dL   HCT, POC 45.9  37.7 - 47.9 %   MCV 96.4  80 - 97 fL   MCH, POC 31.3 (*) 27 - 31.2 pg   MCHC 32.5  31.8 - 35.4 g/dL   RDW, POC 14.1     Platelet Count, POC 215  142 - 424 K/uL   MPV 9.8  0 - 99.8 fL  GLUCOSE, POCT (MANUAL RESULT  ENTRY)      Result Value Ref Range   POC Glucose 108 (*) 70 - 99 mg/dl

## 2013-12-09 NOTE — Telephone Encounter (Signed)
Relevant patient education assigned to patient using Emmi. ° °

## 2013-12-09 NOTE — Patient Instructions (Signed)
Continue current blood pressure medication  If you do not hear back from me by Friday call and speak to the lab. Have him check the culture and see if there was any significant infection that grew. If there is infection growing on the culture you should continue the antibiotic for a full 10 days. If not just discontinue after the third or fourth day.  If you have any further episodes of vision blurring and dizziness he might need further testing so get reassessed

## 2013-12-10 LAB — URINE CULTURE
COLONY COUNT: NO GROWTH
Organism ID, Bacteria: NO GROWTH

## 2013-12-11 DIAGNOSIS — H04129 Dry eye syndrome of unspecified lacrimal gland: Secondary | ICD-10-CM | POA: Diagnosis not present

## 2013-12-11 DIAGNOSIS — H2510 Age-related nuclear cataract, unspecified eye: Secondary | ICD-10-CM | POA: Diagnosis not present

## 2013-12-11 DIAGNOSIS — H02839 Dermatochalasis of unspecified eye, unspecified eyelid: Secondary | ICD-10-CM | POA: Diagnosis not present

## 2013-12-12 ENCOUNTER — Encounter: Payer: Self-pay | Admitting: Family Medicine

## 2013-12-12 ENCOUNTER — Other Ambulatory Visit: Payer: Self-pay | Admitting: Internal Medicine

## 2013-12-13 ENCOUNTER — Telehealth: Payer: Self-pay

## 2013-12-13 NOTE — Telephone Encounter (Signed)
See phone message

## 2013-12-13 NOTE — Telephone Encounter (Signed)
There was no bacteria in her urine.  He gave her 5 days of abx so I think that finishing the abx is ok to do at this time.  If she is still having symptoms - please RTC.

## 2013-12-13 NOTE — Telephone Encounter (Signed)
Pt calling about urine culture results and whether or not she should stay on the abx. Dr. Linna Darner is out until Monday. Can someone review please. She has called and emailed. Thanks

## 2013-12-15 NOTE — Telephone Encounter (Signed)
Tried to call patient, phone number disconnected.

## 2013-12-16 DIAGNOSIS — H903 Sensorineural hearing loss, bilateral: Secondary | ICD-10-CM | POA: Diagnosis not present

## 2013-12-16 DIAGNOSIS — H905 Unspecified sensorineural hearing loss: Secondary | ICD-10-CM | POA: Diagnosis not present

## 2013-12-16 NOTE — Telephone Encounter (Signed)
Mychart message sent.

## 2013-12-18 ENCOUNTER — Other Ambulatory Visit (HOSPITAL_COMMUNITY): Payer: Self-pay | Admitting: Otolaryngology

## 2013-12-18 DIAGNOSIS — D333 Benign neoplasm of cranial nerves: Secondary | ICD-10-CM

## 2014-01-06 ENCOUNTER — Ambulatory Visit (HOSPITAL_COMMUNITY)
Admission: RE | Admit: 2014-01-06 | Discharge: 2014-01-06 | Disposition: A | Discharge status: Home / Self Care | Payer: Medicare Other | Source: Ambulatory Visit | Attending: Otolaryngology | Admitting: Otolaryngology

## 2014-01-06 DIAGNOSIS — D333 Benign neoplasm of cranial nerves: Secondary | ICD-10-CM | POA: Diagnosis not present

## 2014-01-06 DIAGNOSIS — R42 Dizziness and giddiness: Secondary | ICD-10-CM | POA: Insufficient documentation

## 2014-01-06 LAB — CREATININE, SERUM
Creatinine, Ser: 0.7 mg/dL (ref 0.50–1.10)
GFR calc Af Amer: >90 mL/min (ref >90)
GFR, EST NON AFRICAN AMERICAN: 83 mL/min — AB (ref >90)

## 2014-01-06 MED ORDER — GADOBENATE DIMEGLUMINE 529 MG/ML IV SOLN
15.0000 mL | Freq: Once | INTRAVENOUS | Status: AC | PRN
  Administered 2014-01-06: 13 mL via INTRAVENOUS

## 2014-02-27 DIAGNOSIS — H902 Conductive hearing loss, unspecified: Secondary | ICD-10-CM | POA: Diagnosis not present

## 2014-04-09 ENCOUNTER — Other Ambulatory Visit: Payer: Self-pay | Admitting: Internal Medicine

## 2014-04-22 DIAGNOSIS — H905 Unspecified sensorineural hearing loss: Secondary | ICD-10-CM | POA: Diagnosis not present

## 2014-05-29 ENCOUNTER — Other Ambulatory Visit: Payer: Self-pay | Admitting: Internal Medicine

## 2014-06-12 ENCOUNTER — Ambulatory Visit (INDEPENDENT_AMBULATORY_CARE_PROVIDER_SITE_OTHER): Payer: Medicare Other | Admitting: Family

## 2014-06-12 ENCOUNTER — Encounter: Payer: Self-pay | Admitting: Family

## 2014-06-12 VITALS — BP 142/88 | HR 90 | Temp 99.3°F | Resp 18 | Ht 66.5 in | Wt 148.6 lb

## 2014-06-12 DIAGNOSIS — J069 Acute upper respiratory infection, unspecified: Secondary | ICD-10-CM | POA: Diagnosis not present

## 2014-06-12 MED ORDER — AZITHROMYCIN 250 MG PO TABS: ORAL_TABLET | ORAL | Status: DC

## 2014-06-12 MED ORDER — HYDROCODONE-HOMATROPINE 5-1.5 MG/5ML PO SYRP
5.0000 mL | ORAL_SOLUTION | Freq: Three times a day (TID) | ORAL | Status: DC | PRN
Start: 2014-06-12 — End: 2014-10-16

## 2014-06-12 NOTE — Patient Instructions (Signed)
Thank you for choosing [facility].  Summary/Instructions:   Your prescription has been sent to your pharmacy.  Please be cautious with the Hycodan as it may make you drowsy. Please do not operate heavy machinery or drive a vehicle.   Please continue over the counter medication as needed for symptom relief.

## 2014-06-12 NOTE — Assessment & Plan Note (Signed)
Symptoms consistent with URI. Given increased temperature and worsening of symptoms, will start Azithromycin and Hycodan. Discussed sedative effects of hycodan and caution with use. Follow up if symptoms worsen or fail to improve.

## 2014-06-12 NOTE — Progress Notes (Signed)
   Subjective:    Patient ID: Emily Sweeney, female    DOB: 1941-07-13, 73 y.o.   MRN: [id number]  Chief Complaint  Patient presents with  . Nasal Congestion    cough, sore throat, fever x3 days    HPI:  Emily Sweeney is a 73 y.o. female who presents today for nasal congestion.   Acute symptoms started about 3 days ago with sore throat, occasionally productive cough, congestion and fever. Temperature has maintained around 99. Over the course of the last 3 days it has gotten worse, yesterday being the worse, today noting some improvement in talking. Has tried an expectorant with cough suppressant with minimal relief. Cough is severe enough to disturb her sleep. May have gotten it from her grandson.   Allergies  Allergen Reactions  . Ceftin [Cefuroxime Axetil] Hives and Rash  . Ciprofloxacin Rash  . Penicillins Hives and Rash  . Sulfonamide Derivatives Hives and Rash   Current Outpatient Prescriptions on File Prior to Visit  Medication Sig Dispense Refill  . amLODipine (NORVASC) 10 MG tablet TAKE (1/2) TABLET DAILY.  15 tablet  5  . atorvastatin (LIPITOR) 20 MG tablet TAKE 1 TABLET ONCE DAILY.  90 tablet  3  . ramipril (ALTACE) 10 MG capsule TAKE (1) CAPSULE DAILY.  90 capsule  3  . nitrofurantoin, macrocrystal-monohydrate, (MACROBID) 100 MG capsule Take 1 capsule (100 mg total) by mouth 2 (two) times daily.  10 capsule  1   No current facility-administered medications on file prior to visit.    Review of Systems    See HPI   Objective:    BP 142/88  Pulse 90  Temp(Src) 99.3 F (37.4 C) (Oral)  Resp 18  Ht 5' 6.5" (1.689 m)  Wt 148 lb 9.6 oz (67.405 kg)  BMI 23.63 kg/m2  SpO2 94% Nursing note and vital signs reviewed.  Physical Exam  Constitutional: She is oriented to person, place, and time. She appears well-developed and well-nourished.  HENT:  Right Ear: Hearing, tympanic membrane, external ear and ear canal normal.  Left Ear: Hearing, tympanic membrane,  external ear and ear canal normal.  Nose: Right sinus exhibits maxillary sinus tenderness. Right sinus exhibits no frontal sinus tenderness. Left sinus exhibits no maxillary sinus tenderness and no frontal sinus tenderness.  Mouth/Throat: Uvula is midline, oropharynx is clear and moist and mucous membranes are normal.  Cardiovascular: Normal rate, regular rhythm and normal heart sounds.   Pulmonary/Chest: Effort normal and breath sounds normal.  Neurological: She is alert and oriented to person, place, and time.  Skin: Skin is warm and dry.  Psychiatric: She has a normal mood and affect. Her behavior is normal. Judgment and thought content normal.        Assessment & Plan:

## 2014-06-12 NOTE — Progress Notes (Signed)
Pre visit review using our clinic review tool, if applicable. No additional management support is needed unless otherwise documented below in the visit note. 

## 2014-09-23 ENCOUNTER — Ambulatory Visit (INDEPENDENT_AMBULATORY_CARE_PROVIDER_SITE_OTHER): Payer: Medicare Other

## 2014-09-23 DIAGNOSIS — Z23 Encounter for immunization: Secondary | ICD-10-CM

## 2014-10-16 ENCOUNTER — Ambulatory Visit (INDEPENDENT_AMBULATORY_CARE_PROVIDER_SITE_OTHER): Payer: Medicare Other | Admitting: Internal Medicine

## 2014-10-16 ENCOUNTER — Other Ambulatory Visit (INDEPENDENT_AMBULATORY_CARE_PROVIDER_SITE_OTHER): Payer: Medicare Other

## 2014-10-16 ENCOUNTER — Ambulatory Visit: Payer: PRIVATE HEALTH INSURANCE | Admitting: Internal Medicine

## 2014-10-16 ENCOUNTER — Encounter: Payer: Self-pay | Admitting: Internal Medicine

## 2014-10-16 VITALS — BP 128/72 | HR 97 | Temp 97.9°F | Resp 16 | Ht 66.5 in | Wt 148.8 lb

## 2014-10-16 DIAGNOSIS — I10 Essential (primary) hypertension: Secondary | ICD-10-CM

## 2014-10-16 DIAGNOSIS — R0789 Other chest pain: Secondary | ICD-10-CM | POA: Diagnosis not present

## 2014-10-16 DIAGNOSIS — Z23 Encounter for immunization: Secondary | ICD-10-CM | POA: Diagnosis not present

## 2014-10-16 DIAGNOSIS — R739 Hyperglycemia, unspecified: Secondary | ICD-10-CM

## 2014-10-16 DIAGNOSIS — R072 Precordial pain: Secondary | ICD-10-CM | POA: Insufficient documentation

## 2014-10-16 DIAGNOSIS — R9431 Abnormal electrocardiogram [ECG] [EKG]: Secondary | ICD-10-CM | POA: Insufficient documentation

## 2014-10-16 LAB — URINALYSIS, ROUTINE W REFLEX MICROSCOPIC
BILIRUBIN URINE: NEGATIVE
HGB URINE DIPSTICK: NEGATIVE
Ketones, ur: NEGATIVE
NITRITE: NEGATIVE
Specific Gravity, Urine: 1.01 (ref 1.000–1.030)
TOTAL PROTEIN, URINE-UPE24: NEGATIVE
UROBILINOGEN UA: 0.2 (ref 0.0–1.0)
Urine Glucose: NEGATIVE
pH: 7.5 (ref 5.0–8.0)

## 2014-10-16 LAB — CBC WITH DIFFERENTIAL/PLATELET
BASOS ABS: 0 10*3/uL (ref 0.0–0.1)
BASOS PCT: 0.3 % (ref 0.0–3.0)
EOS ABS: 0 10*3/uL (ref 0.0–0.7)
Eosinophils Relative: 0.2 % (ref 0.0–5.0)
HCT: 40.6 % (ref 36.0–46.0)
Hemoglobin: 13.6 g/dL (ref 12.0–15.0)
Lymphocytes Relative: 16.2 % (ref 12.0–46.0)
Lymphs Abs: 1.5 10*3/uL (ref 0.7–4.0)
MCHC: 33.5 g/dL (ref 30.0–36.0)
MCV: 91.3 fl (ref 78.0–100.0)
MONO ABS: 0.7 10*3/uL (ref 0.1–1.0)
Monocytes Relative: 7.5 % (ref 3.0–12.0)
NEUTROS ABS: 7.2 10*3/uL (ref 1.4–7.7)
Neutrophils Relative %: 75.8 % (ref 43.0–77.0)
Platelets: 242 10*3/uL (ref 150.0–400.0)
RBC: 4.45 Mil/uL (ref 3.87–5.11)
RDW: 15 % (ref 11.5–15.5)
WBC: 9.5 10*3/uL (ref 4.0–10.5)

## 2014-10-16 LAB — COMPREHENSIVE METABOLIC PANEL
ALBUMIN: 4.5 g/dL (ref 3.5–5.2)
ALK PHOS: 82 U/L (ref 39–117)
ALT: 10 U/L (ref 0–35)
AST: 17 U/L (ref 0–37)
BUN: 15 mg/dL (ref 6–23)
CO2: 27 mEq/L (ref 19–32)
Calcium: 9.4 mg/dL (ref 8.4–10.5)
Chloride: 104 mEq/L (ref 96–112)
Creatinine, Ser: 0.72 mg/dL (ref 0.40–1.20)
GFR: 83.81 mL/min (ref >60.00)
Glucose, Bld: 111 mg/dL — ABNORMAL HIGH (ref 70–99)
POTASSIUM: 4.1 meq/L (ref 3.5–5.1)
Sodium: 139 mEq/L (ref 135–145)
Total Bilirubin: 0.6 mg/dL (ref 0.2–1.2)
Total Protein: 7.5 g/dL (ref 6.0–8.3)

## 2014-10-16 LAB — TROPONIN I: TNIDX: 0 ug/l (ref 0.00–0.06)

## 2014-10-16 LAB — CARDIAC PANEL
CK MB: 1.4 ng/mL (ref 0.3–4.0)
Relative Index: 2.5 calc (ref 0.0–2.5)
Total CK: 55 U/L (ref 7–177)

## 2014-10-16 LAB — TSH: TSH: 1.63 u[IU]/mL (ref 0.35–4.50)

## 2014-10-16 MED ORDER — ASPIRIN EC 81 MG PO TBEC: 81.0000 mg | DELAYED_RELEASE_TABLET | Freq: Every day | ORAL | Status: AC

## 2014-10-16 NOTE — Patient Instructions (Signed)

## 2014-10-16 NOTE — Progress Notes (Signed)
Subjective:    Patient ID: Emily Sweeney, female    DOB: May 12, 1941, 74 y.o.   MRN: [id number]  HPI Comments: She had an episode of "chest tightness" a few days ago  Chest Pain  This is a new problem. The current episode started in the past 7 days. The onset quality is gradual. The problem occurs intermittently. The problem has been resolved. The pain is present in the substernal region. The pain is at a severity of 1/10. The pain is mild. The quality of the pain is described as tightness. The pain does not radiate. Associated symptoms include dizziness and malaise/fatigue. Pertinent negatives include no abdominal pain, back pain, claudication, cough, diaphoresis, exertional chest pressure, fever, headaches, hemoptysis, irregular heartbeat, leg pain, lower extremity edema, nausea, near-syncope, numbness, orthopnea, palpitations, PND, shortness of breath, sputum production, syncope, vomiting or weakness. She has tried nothing for the symptoms.  Her past medical history is significant for hyperlipidemia and hypertension.  Pertinent negatives for past medical history include no seizures.      Review of Systems  Constitutional: Positive for malaise/fatigue and fatigue. Negative for fever, chills, diaphoresis and appetite change.  HENT: Negative.   Eyes: Negative.   Respiratory: Negative.  Negative for apnea, cough, hemoptysis, sputum production, choking, chest tightness, shortness of breath, wheezing and stridor.   Cardiovascular: Positive for chest pain. Negative for palpitations, orthopnea, claudication, leg swelling, syncope, PND and near-syncope.  Gastrointestinal: Negative.  Negative for nausea, vomiting, abdominal pain, diarrhea, constipation and blood in stool.  Endocrine: Negative.   Genitourinary: Negative.   Musculoskeletal: Negative.  Negative for back pain.  Skin: Negative.  Negative for rash.  Allergic/Immunologic: Negative.   Neurological: Positive for dizziness. Negative for  tremors, seizures, weakness, numbness and headaches.  Hematological: Negative.  Negative for adenopathy. Does not bruise/bleed easily.  Psychiatric/Behavioral: Negative.        Objective:   Physical Exam  Constitutional: She is oriented to person, place, and time. She appears well-developed and well-nourished. No distress.  HENT:  Head: Normocephalic and atraumatic.  Mouth/Throat: Oropharynx is clear and moist. No oropharyngeal exudate.  Eyes: Conjunctivae are normal. Right eye exhibits no discharge. Left eye exhibits no discharge. No scleral icterus.  Neck: Normal range of motion. Neck supple. No JVD present. No tracheal deviation present. No thyromegaly present.  Cardiovascular: Normal rate, regular rhythm, normal heart sounds and intact distal pulses.  Exam reveals no gallop and no friction rub.   No murmur heard. Pulmonary/Chest: Effort normal and breath sounds normal. No stridor. No respiratory distress. She has no wheezes. She has no rales. She exhibits no tenderness.  Abdominal: Soft. Bowel sounds are normal. She exhibits no distension and no mass. There is no tenderness. There is no rebound and no guarding.  Musculoskeletal: Normal range of motion. She exhibits no edema or tenderness.  Lymphadenopathy:    She has no cervical adenopathy.  Neurological: She is oriented to person, place, and time.  Skin: Skin is warm and dry. No rash noted. She is not diaphoretic. No erythema. No pallor.  Psychiatric: She has a normal mood and affect. Her behavior is normal. Judgment and thought content normal.  Vitals reviewed.    Lab Results  Component Value Date   WBC 9.5 10/16/2014   HGB 13.6 10/16/2014   HCT 40.6 10/16/2014   PLT 242.0 10/16/2014   GLUCOSE 111* 10/16/2014   CHOL 190 12/18/2011   TRIG 95.0 12/18/2011   HDL 82.90 12/18/2011   LDLCALC 88 12/18/2011   ALT  10 10/16/2014   AST 17 10/16/2014   NA 139 10/16/2014   K 4.1 10/16/2014   CL 104 10/16/2014   CREATININE 0.72  10/16/2014   BUN 15 10/16/2014   CO2 27 10/16/2014   TSH 1.63 10/16/2014   HGBA1C 6.0 12/18/2011       Assessment & Plan:

## 2014-10-16 NOTE — Progress Notes (Signed)
Pre visit review using our clinic review tool, if applicable. No additional management support is needed unless otherwise documented below in the visit note. 

## 2014-10-18 NOTE — Assessment & Plan Note (Signed)
Her BP is well controlled Lytes and renal function are stable 

## 2014-10-18 NOTE — Assessment & Plan Note (Signed)
She had an episode of CP that has both typical and atypical features The pain has not returned for 3 days Her EKG shows a Q wave in III, voltage loss in aVF, and RSR in V1 and V2 - there are no old EKG's for comparison Her labs and cardiac enzymes are normal today I am concerned that she may have ischemic heart disease so I have asked her to start asa 81 mg per day and to have a lexiscan done She will let me know if she has any recurrent symptoms

## 2014-10-18 NOTE — Assessment & Plan Note (Signed)
See info for chest tightness Will get a lexiscan done to screen for CAD

## 2014-10-19 ENCOUNTER — Encounter: Payer: Self-pay | Admitting: Internal Medicine

## 2014-10-20 ENCOUNTER — Encounter: Payer: Self-pay | Admitting: Internal Medicine

## 2014-10-21 ENCOUNTER — Encounter: Payer: Self-pay | Admitting: Internal Medicine

## 2014-10-22 ENCOUNTER — Ambulatory Visit (HOSPITAL_COMMUNITY)
Admission: RE | Admit: 2014-10-22 | Discharge: 2014-10-22 | Disposition: A | Discharge status: Home / Self Care | Payer: Medicare Other | Source: Ambulatory Visit | Attending: Internal Medicine | Admitting: Internal Medicine

## 2014-10-22 DIAGNOSIS — R002 Palpitations: Secondary | ICD-10-CM | POA: Diagnosis not present

## 2014-10-22 DIAGNOSIS — Z87891 Personal history of nicotine dependence: Secondary | ICD-10-CM | POA: Diagnosis not present

## 2014-10-22 DIAGNOSIS — R0609 Other forms of dyspnea: Secondary | ICD-10-CM | POA: Diagnosis not present

## 2014-10-22 DIAGNOSIS — R42 Dizziness and giddiness: Secondary | ICD-10-CM | POA: Diagnosis not present

## 2014-10-22 DIAGNOSIS — R5383 Other fatigue: Secondary | ICD-10-CM | POA: Diagnosis not present

## 2014-10-22 DIAGNOSIS — Z8249 Family history of ischemic heart disease and other diseases of the circulatory system: Secondary | ICD-10-CM | POA: Diagnosis not present

## 2014-10-22 DIAGNOSIS — R9431 Abnormal electrocardiogram [ECG] [EKG]: Secondary | ICD-10-CM | POA: Diagnosis not present

## 2014-10-22 DIAGNOSIS — R079 Chest pain, unspecified: Secondary | ICD-10-CM | POA: Insufficient documentation

## 2014-10-22 DIAGNOSIS — I10 Essential (primary) hypertension: Secondary | ICD-10-CM | POA: Diagnosis not present

## 2014-10-22 DIAGNOSIS — E785 Hyperlipidemia, unspecified: Secondary | ICD-10-CM | POA: Insufficient documentation

## 2014-10-22 DIAGNOSIS — R0789 Other chest pain: Secondary | ICD-10-CM | POA: Diagnosis not present

## 2014-10-22 MED ORDER — REGADENOSON 0.4 MG/5ML IV SOLN
0.4000 mg | Freq: Once | INTRAVENOUS | Status: AC
  Administered 2014-10-22: 0.4 mg via INTRAVENOUS

## 2014-10-22 MED ORDER — AMINOPHYLLINE 25 MG/ML IV SOLN
75.0000 mg | Freq: Once | INTRAVENOUS | Status: AC
  Administered 2014-10-22: 75 mg via INTRAVENOUS

## 2014-10-22 MED ORDER — TECHNETIUM TC 99M SESTAMIBI GENERIC - CARDIOLITE
30.2000 | Freq: Once | INTRAVENOUS | Status: AC | PRN
  Administered 2014-10-22: 30.2 via INTRAVENOUS

## 2014-10-22 MED ORDER — TECHNETIUM TC 99M SESTAMIBI GENERIC - CARDIOLITE
10.6000 | Freq: Once | INTRAVENOUS | Status: AC | PRN
  Administered 2014-10-22: 11 via INTRAVENOUS

## 2014-10-22 NOTE — Procedures (Addendum)
[facility] [address] [place] [address] [phone]  Cardiology Nuclear Med Study  Emily Sweeney is a 74 y.o. female     MRN : [id number]     DOB: 08-18-1940  Procedure Date: 10/22/2014  Nuclear Med Background Indication for Stress Test:  Evaluation for Ischemia and Abnormal EKG History:  No prior cardiac or respiratory history reported;No prior NUC MPI for comparison. Cardiac Risk Factors: Family History - CAD, History of Smoking, Hypertension and Lipids  Symptoms:  Chest Pain, Dizziness, DOE, Fatigue and Palpitations   Nuclear Pre-Procedure Caffeine/Decaff Intake:  1:00am NPO After: 9:00am   IV Site: R Forearm  IV 0.9% NS with Angio Cath:  22g  Chest Size (in):  n/a IV Started by: Rolene Course, RN  Height: 5\' 6"  (3.299 m)  Cup Size: C  BMI:  Body mass index is 23.9 kg/(m^2). Weight:  148 lb (67.132 kg)   Tech Comments:  n/a    Nuclear Med Study 1 or 2 day study: 1 day  Stress Test Type:  [place] Provider:  Scarlette Calico, MD   Resting Radionuclide: Technetium 11m Sestamibi  Resting Radionuclide Dose: 10.6 mCi   Stress Radionuclide:  Technetium 90m Sestamibi  Stress Radionuclide Dose: 30.2 mCi           Stress Protocol Rest HR: 90 Stress HR: 108  Rest BP: 140/91 Stress BP: 140/91  Exercise Time (min): n/a METS: n/a          Dose of Adenosine (mg):  n/a Dose of Lexiscan: 0.4 mg  Dose of Atropine (mg): n/a Dose of Dobutamine: n/a mcg/kg/min (at max HR)  Stress Test Technologist: Mellody Memos, CCT Nuclear Technologist:Brenlee Young,CNMT   Rest Procedure:  Myocardial perfusion imaging was performed at rest 45 minutes following the intravenous administration of Technetium 90m Sestamibi. Stress Procedure:  The patient received IV Lexiscan 0.4 mg over 15-seconds.  Technetium 65m Sestamibi injected IV at 30-seconds.  Patient experienced stomach pains, chest tightness and was  administered 75 mg of Aminophylline IV at 5 minutes. There were no significant changes with Lexiscan.  Quantitative spect images were obtained after a 45 minute delay.  Transient Ischemic Dilatation (Normal <1.22):  1.22  QGS EDV:  46 ml QGS ESV:  07 ml LV Ejection Fraction: 84%  Rest ECG: NSR - Normal EKG  Stress ECG: There are scattered PVCs.  QPS Raw Data Images:  Normal; no motion artifact; normal heart/lung ratio. Stress Images:  Normal homogeneous uptake in all areas of the myocardium. Rest Images:  Normal homogeneous uptake in all areas of the myocardium. Subtraction (SDS):  No evidence of ischemia.  Impression Exercise Capacity:  Lexiscan with no exercise. BP Response:  Normal blood pressure response. Clinical Symptoms:  5/10 chest tightness ECG Impression:  No significant ECG changes with Lexiscan. Comparison with Prior Nuclear Study: No previous nuclear study performed  Overall Impression:  Normal stress nuclear study.  LV Wall Motion:  NL LV Function; NL Wall Motion; LVEF 84%  Emily Casino, MD, [facility] Board Certified in Nuclear Cardiology Attending Cardiologist [place] C, MD  10/23/2014 8:29 AM

## 2014-10-23 ENCOUNTER — Encounter: Payer: Self-pay | Admitting: Internal Medicine

## 2014-10-28 ENCOUNTER — Ambulatory Visit (INDEPENDENT_AMBULATORY_CARE_PROVIDER_SITE_OTHER): Payer: Medicare Other | Admitting: Internal Medicine

## 2014-10-28 ENCOUNTER — Other Ambulatory Visit (INDEPENDENT_AMBULATORY_CARE_PROVIDER_SITE_OTHER): Payer: Medicare Other

## 2014-10-28 VITALS — BP 148/78 | HR 90 | Temp 99.1°F | Resp 16 | Wt 148.0 lb

## 2014-10-28 DIAGNOSIS — R002 Palpitations: Secondary | ICD-10-CM

## 2014-10-28 DIAGNOSIS — R9431 Abnormal electrocardiogram [ECG] [EKG]: Secondary | ICD-10-CM | POA: Diagnosis not present

## 2014-10-28 DIAGNOSIS — I10 Essential (primary) hypertension: Secondary | ICD-10-CM | POA: Diagnosis not present

## 2014-10-28 LAB — T3, FREE: T3, Free: 3.1 pg/mL (ref 2.3–4.2)

## 2014-10-28 LAB — T4: T4 TOTAL: 6.4 ug/dL (ref 4.5–12.0)

## 2014-10-28 LAB — TSH: TSH: 1.43 u[IU]/mL (ref 0.35–4.50)

## 2014-10-28 NOTE — Progress Notes (Signed)
Subjective:    Patient ID: Emily Sweeney, female    DOB: 05-Jan-1941, 74 y.o.   MRN: [id number]  HPI Comments: She returns for f/up regarding episodes of chest tightness at rest, her lexiscan was normal. Today she has a new complaint, she complains of an elevated heart rate when she is sitting in work meetings.  Palpitations  This is a new problem. The current episode started in the past 7 days. The problem occurs intermittently. The problem has been unchanged. Nothing aggravates the symptoms. Associated symptoms include chest pain, dizziness and malaise/fatigue. Pertinent negatives include no anxiety, chest fullness, coughing, diaphoresis, fever, irregular heartbeat, nausea, near-syncope, numbness, shortness of breath, syncope, vomiting or weakness. Associated symptoms comments: Her BP was down to 101/59 this morning and she felt dizzy and near-syncopal. She has tried nothing for the symptoms. The treatment provided no relief.      Review of Systems  Constitutional: Positive for malaise/fatigue and fatigue. Negative for fever, chills, diaphoresis and appetite change.  HENT: Negative.   Eyes: Negative.   Respiratory: Negative.  Negative for cough, choking, chest tightness, shortness of breath and stridor.   Cardiovascular: Positive for chest pain and palpitations. Negative for leg swelling, syncope and near-syncope.  Gastrointestinal: Negative.  Negative for nausea, vomiting, abdominal pain, diarrhea, constipation and blood in stool.  Endocrine: Negative.   Genitourinary: Negative.   Musculoskeletal: Negative.   Skin: Negative.  Negative for rash.  Allergic/Immunologic: Negative.   Neurological: Positive for dizziness. Negative for tremors, seizures, syncope, facial asymmetry, speech difficulty, weakness, light-headedness, numbness and headaches.  Hematological: Negative.  Negative for adenopathy. Does not bruise/bleed easily.  Psychiatric/Behavioral: Negative.  Negative for suicidal  ideas, confusion, sleep disturbance, dysphoric mood and decreased concentration. The patient is not nervous/anxious.        Objective:   Physical Exam  Constitutional: She is oriented to person, place, and time. She appears well-developed and well-nourished. No distress.  HENT:  Head: Normocephalic and atraumatic.  Mouth/Throat: Oropharynx is clear and moist. No oropharyngeal exudate.  Eyes: Conjunctivae are normal. Right eye exhibits no discharge. Left eye exhibits no discharge. No scleral icterus.  Neck: Normal range of motion. Neck supple. No JVD present. No tracheal deviation present. No thyromegaly present.  Cardiovascular: Normal rate, regular rhythm, normal heart sounds and intact distal pulses.  Exam reveals no gallop and no friction rub.   No murmur heard. Pulmonary/Chest: Effort normal and breath sounds normal. No stridor. No respiratory distress. She has no wheezes. She has no rales. She exhibits no tenderness.  Abdominal: Soft. Bowel sounds are normal. She exhibits no distension and no mass. There is no tenderness. There is no rebound and no guarding.  Musculoskeletal: Normal range of motion. She exhibits edema (trace edema in BLE). She exhibits no tenderness.  Lymphadenopathy:    She has no cervical adenopathy.  Neurological: She is oriented to person, place, and time.  Skin: Skin is warm and dry. No rash noted. She is not diaphoretic. No erythema. No pallor.  Psychiatric: She has a normal mood and affect. Her behavior is normal. Judgment and thought content normal.  Nursing note and vitals reviewed.    Lab Results  Component Value Date   WBC 9.5 10/16/2014   HGB 13.6 10/16/2014   HCT 40.6 10/16/2014   PLT 242.0 10/16/2014   GLUCOSE 111* 10/16/2014   CHOL 190 12/18/2011   TRIG 95.0 12/18/2011   HDL 82.90 12/18/2011   LDLCALC 88 12/18/2011   ALT 10 10/16/2014   AST  17 10/16/2014   NA 139 10/16/2014   K 4.1 10/16/2014   CL 104 10/16/2014   CREATININE 0.72  10/16/2014   BUN 15 10/16/2014   CO2 27 10/16/2014   TSH 1.63 10/16/2014   HGBA1C 6.0 12/18/2011       Assessment & Plan:

## 2014-10-28 NOTE — Patient Instructions (Signed)

## 2014-10-28 NOTE — Progress Notes (Signed)
Pre visit review using our clinic review tool, if applicable. No additional management support is needed unless otherwise documented below in the visit note. 

## 2014-10-29 ENCOUNTER — Encounter: Payer: Self-pay | Admitting: Internal Medicine

## 2014-10-29 LAB — THYROID PEROXIDASE ANTIBODY

## 2014-10-29 NOTE — Assessment & Plan Note (Signed)
Her HR is normal today, recent EKG was neg for an abnormal rhythm Her TFT's are normal and recent labs were normal Will get an event monitor done to see what her heart rate is while she feels the palpitations

## 2014-10-29 NOTE — Assessment & Plan Note (Signed)
She feels poorly and complains that her BP is going too low - will hold the CCB for now

## 2014-10-29 NOTE — Assessment & Plan Note (Signed)
I think this is a benign variant Will follow for now

## 2014-11-09 ENCOUNTER — Encounter (INDEPENDENT_AMBULATORY_CARE_PROVIDER_SITE_OTHER): Payer: Medicare Other

## 2014-11-09 ENCOUNTER — Encounter: Payer: Self-pay | Admitting: *Deleted

## 2014-11-09 DIAGNOSIS — R002 Palpitations: Secondary | ICD-10-CM | POA: Diagnosis not present

## 2014-11-09 NOTE — Progress Notes (Signed)
Patient ID: Emily Sweeney, female   DOB: [birth year], 74 y.o.   MRN: [id number]  [employer] verite 30 day cardiac event monitor applied to patient.

## 2014-11-30 ENCOUNTER — Encounter: Payer: Self-pay | Admitting: Internal Medicine

## 2014-12-07 ENCOUNTER — Encounter: Payer: Self-pay | Admitting: Internal Medicine

## 2014-12-18 ENCOUNTER — Encounter: Payer: Self-pay | Admitting: Gastroenterology

## 2014-12-23 NOTE — Telephone Encounter (Signed)
Emily Sweeney, there appears to be a failure to result a test on the EMR, namely the result for the Cardiac Event monitor for Mar 28; can this be resolved?  Pt is wanting results  I presume a delay in results to the ordering MD could lead to an adverse event for this patient; thanks [place]

## 2014-12-28 ENCOUNTER — Telehealth: Payer: Self-pay

## 2014-12-28 NOTE — Telephone Encounter (Signed)
Pt calling stating her PCP Dr. Ronnald Ramp is requesting results of her monitor that was placed in March.  Advised that Dr. Caryl Comes is the one to read monitor and he was called out of town in a emergency and has not read the results.  He will be in the office this afternoon and will have him read the results and then forward to Dr. Ronnald Ramp.

## 2014-12-28 NOTE — Telephone Encounter (Signed)
New message      Pt want event monitor.  She states her PCP has not received the results and she returned the monitor in april

## 2014-12-29 NOTE — Telephone Encounter (Signed)
Pt monitor results reviewed, signed, and faxed by Dr. Caryl Comes.  Pt made aware that results sent to Dr. Ronnald Ramp office.  Pt requested self referral appt to see primary cardiologiest.  Appt. set up for pt on 02/25/15 @ 9AM with Dr. Irish Lack, pt aware of appt.  No additional questions at this time.

## 2015-01-01 ENCOUNTER — Encounter: Payer: Self-pay | Admitting: Internal Medicine

## 2015-01-02 ENCOUNTER — Encounter: Payer: Self-pay | Admitting: Internal Medicine

## 2015-01-04 ENCOUNTER — Telehealth: Payer: Self-pay | Admitting: Internal Medicine

## 2015-01-04 NOTE — Telephone Encounter (Signed)
Dr. Ronnald Ramp told patient she could pick up myocardial perfusion results.  I was not sure what to print.  Patient would like to pick up today if possible.

## 2015-01-04 NOTE — Telephone Encounter (Signed)
Called pt no answer LMOM left copy of test for pick-up...Emily Sweeney

## 2015-01-04 NOTE — Telephone Encounter (Signed)
Pt called in said that this was the wrong report, Myocardial Monitoring report??

## 2015-01-04 NOTE — Telephone Encounter (Signed)
Called pt to clarify testing pt is needing heart monitor inform pt will leave for pick-up...Emily Sweeney

## 2015-01-12 ENCOUNTER — Encounter: Payer: Self-pay | Admitting: Internal Medicine

## 2015-01-12 ENCOUNTER — Other Ambulatory Visit (INDEPENDENT_AMBULATORY_CARE_PROVIDER_SITE_OTHER): Payer: Medicare Other

## 2015-01-12 ENCOUNTER — Ambulatory Visit (INDEPENDENT_AMBULATORY_CARE_PROVIDER_SITE_OTHER): Payer: Medicare Other | Admitting: Internal Medicine

## 2015-01-12 ENCOUNTER — Other Ambulatory Visit: Payer: Self-pay | Admitting: Internal Medicine

## 2015-01-12 VITALS — BP 168/98 | HR 90 | Temp 98.2°F | Resp 16 | Wt 149.0 lb

## 2015-01-12 DIAGNOSIS — I10 Essential (primary) hypertension: Secondary | ICD-10-CM

## 2015-01-12 DIAGNOSIS — E559 Vitamin D deficiency, unspecified: Secondary | ICD-10-CM

## 2015-01-12 DIAGNOSIS — E7800 Pure hypercholesterolemia, unspecified: Secondary | ICD-10-CM

## 2015-01-12 DIAGNOSIS — M8580 Other specified disorders of bone density and structure, unspecified site: Secondary | ICD-10-CM

## 2015-01-12 DIAGNOSIS — M899 Disorder of bone, unspecified: Secondary | ICD-10-CM | POA: Diagnosis not present

## 2015-01-12 DIAGNOSIS — R739 Hyperglycemia, unspecified: Secondary | ICD-10-CM | POA: Diagnosis not present

## 2015-01-12 DIAGNOSIS — M810 Age-related osteoporosis without current pathological fracture: Secondary | ICD-10-CM | POA: Insufficient documentation

## 2015-01-12 DIAGNOSIS — R002 Palpitations: Secondary | ICD-10-CM

## 2015-01-12 LAB — LIPID PANEL
CHOLESTEROL: 185 mg/dL (ref 0–200)
HDL: 80.7 mg/dL (ref >39.00)
LDL CALC: 88 mg/dL (ref 0–99)
NonHDL: 104.3
TRIGLYCERIDES: 82 mg/dL (ref 0.0–149.0)
Total CHOL/HDL Ratio: 2
VLDL: 16.4 mg/dL (ref 0.0–40.0)

## 2015-01-12 LAB — BASIC METABOLIC PANEL
BUN: 13 mg/dL (ref 6–23)
CO2: 25 mEq/L (ref 19–32)
CREATININE: 0.76 mg/dL (ref 0.40–1.20)
Calcium: 9.6 mg/dL (ref 8.4–10.5)
Chloride: 103 mEq/L (ref 96–112)
GFR: 78.69 mL/min (ref >60.00)
GLUCOSE: 117 mg/dL — AB (ref 70–99)
POTASSIUM: 5.8 meq/L — AB (ref 3.5–5.1)
Sodium: 137 mEq/L (ref 135–145)

## 2015-01-12 LAB — CBC WITH DIFFERENTIAL/PLATELET
BASOS PCT: 1 % (ref 0.0–3.0)
Basophils Absolute: 0.1 10*3/uL (ref 0.0–0.1)
EOS PCT: 1.2 % (ref 0.0–5.0)
Eosinophils Absolute: 0.1 10*3/uL (ref 0.0–0.7)
HCT: 39.5 % (ref 36.0–46.0)
Hemoglobin: 13 g/dL (ref 12.0–15.0)
LYMPHS PCT: 26 % (ref 12.0–46.0)
Lymphs Abs: 1.7 10*3/uL (ref 0.7–4.0)
MCHC: 32.8 g/dL (ref 30.0–36.0)
MCV: 91.9 fl (ref 78.0–100.0)
Monocytes Absolute: 0.5 10*3/uL (ref 0.1–1.0)
Monocytes Relative: 7.6 % (ref 3.0–12.0)
NEUTROS ABS: 4.1 10*3/uL (ref 1.4–7.7)
NEUTROS PCT: 64.2 % (ref 43.0–77.0)
Platelets: 195 10*3/uL (ref 150.0–400.0)
RBC: 4.29 Mil/uL (ref 3.87–5.11)
RDW: 14.3 % (ref 11.5–15.5)
WBC: 8.4 10*3/uL (ref 4.0–10.5)

## 2015-01-12 LAB — TSH: TSH: 1.42 u[IU]/mL (ref 0.35–4.50)

## 2015-01-12 LAB — VITAMIN D 25 HYDROXY (VIT D DEFICIENCY, FRACTURES): VITD: 10.94 ng/mL — ABNORMAL LOW (ref 30.00–100.00)

## 2015-01-12 LAB — HEMOGLOBIN A1C: Hgb A1c MFr Bld: 5.7 % (ref 4.6–6.5)

## 2015-01-12 MED ORDER — NEBIVOLOL HCL 5 MG PO TABS: 5.0000 mg | ORAL_TABLET | Freq: Every day | ORAL | Status: DC

## 2015-01-12 MED ORDER — CHOLECALCIFEROL 50 MCG (2000 UT) PO TABS: 1.0000 | ORAL_TABLET | Freq: Every day | ORAL | Status: DC

## 2015-01-12 NOTE — Progress Notes (Signed)
Pre visit review using our clinic review tool, if applicable. No additional management support is needed unless otherwise documented below in the visit note. 

## 2015-01-12 NOTE — Progress Notes (Signed)
Subjective:  Patient ID: Emily Sweeney, female    DOB: [birth year]  Age: 74 y.o. MRN: [id number]  CC: Hypertension   HPI Emily Sweeney presents for follow up on HTN and palpitations, she wakes up during the night and feels like her heart is racing and her BP has not been well controlled by her report with BP's at home consistently above the 150-160 range. Her event monitor was reported to have some benign PVC's and she has been referred to cardiology.  Outpatient Prescriptions Prior to Visit  Medication Sig Dispense Refill  . aspirin EC 81 MG tablet Take 1 tablet (81 mg total) by mouth daily. 90 tablet 1  . atorvastatin (LIPITOR) 20 MG tablet TAKE 1 TABLET ONCE DAILY. 90 tablet 3  . ramipril (ALTACE) 10 MG capsule TAKE (1) CAPSULE DAILY. 90 capsule 3   No facility-administered medications prior to visit.    ROS Review of Systems  Constitutional: Positive for fatigue. Negative for fever, chills, diaphoresis, activity change, appetite change and unexpected weight change.  HENT: Negative.   Eyes: Negative.   Respiratory: Negative.  Negative for apnea, cough, choking, chest tightness, shortness of breath, wheezing and stridor.   Cardiovascular: Positive for palpitations. Negative for chest pain and leg swelling.  Gastrointestinal: Negative.  Negative for nausea, vomiting, abdominal pain, diarrhea, constipation and blood in stool.  Endocrine: Negative.   Genitourinary: Negative.   Musculoskeletal: Negative.   Skin: Negative.   Allergic/Immunologic: Negative.   Neurological: Negative.  Negative for dizziness, syncope, weakness, light-headedness, numbness and headaches.  Hematological: Negative.  Negative for adenopathy. Does not bruise/bleed easily.  Psychiatric/Behavioral: Negative.     Objective:  BP 168/98 mmHg  Pulse 90  Temp(Src) 98.2 F (36.8 C) (Oral)  Resp 16  Wt 149 lb (67.586 kg)  SpO2 95%  BP Readings from Last 3 Encounters:  01/12/15 168/98  10/28/14 148/78    10/16/14 128/72    Wt Readings from Last 3 Encounters:  01/12/15 149 lb (67.586 kg)  10/28/14 148 lb (67.132 kg)  10/22/14 148 lb (67.132 kg)    Physical Exam  Constitutional: She is oriented to person, place, and time. She appears well-developed and well-nourished. No distress.  HENT:  Head: Normocephalic and atraumatic.  Mouth/Throat: Oropharynx is clear and moist. No oropharyngeal exudate.  Eyes: Conjunctivae are normal. Right eye exhibits no discharge. Left eye exhibits no discharge. No scleral icterus.  Neck: Normal range of motion. Neck supple. No JVD present. No tracheal deviation present. No thyromegaly present.  Cardiovascular: Normal rate, regular rhythm, normal heart sounds and intact distal pulses.  Exam reveals no gallop and no friction rub.   No murmur heard. Pulmonary/Chest: Effort normal and breath sounds normal. No stridor. No respiratory distress. She has no wheezes. She has no rales. She exhibits no tenderness.  Abdominal: Soft. Bowel sounds are normal. She exhibits no distension and no mass. There is no tenderness. There is no rebound and no guarding.  Musculoskeletal: Normal range of motion. She exhibits no edema or tenderness.  Lymphadenopathy:    She has no cervical adenopathy.  Neurological: She is oriented to person, place, and time.  Skin: Skin is warm and dry. No rash noted. She is not diaphoretic. No erythema. No pallor.  Psychiatric: She has a normal mood and affect. Her behavior is normal. Judgment and thought content normal.  Vitals reviewed.   Lab Results  Component Value Date   WBC 9.5 10/16/2014   HGB 13.6 10/16/2014   HCT 40.6 10/16/2014  PLT 242.0 10/16/2014   GLUCOSE 111* 10/16/2014   CHOL 190 12/18/2011   TRIG 95.0 12/18/2011   HDL 82.90 12/18/2011   LDLCALC 88 12/18/2011   ALT 10 10/16/2014   AST 17 10/16/2014   NA 139 10/16/2014   K 4.1 10/16/2014   CL 104 10/16/2014   CREATININE 0.72 10/16/2014   BUN 15 10/16/2014   CO2 27  10/16/2014   TSH 1.43 10/28/2014   HGBA1C 6.0 12/18/2011    No results found.  Assessment & Plan:   Emily Sweeney was seen today for hypertension.  Diagnoses and all orders for this visit:  Hyperglycemia - will screen for DM2 today Orders: -     Basic metabolic panel; Future -     Hemoglobin A1c; Future  HYPERCHOLESTEROLEMIA, PURE - she is doing well on the statin Orders: -     Lipid panel; Future -     TSH; Future  Essential hypertension - her BP is not well controlled, will add bystolic to the ACEI, will check her labs today to screen for end organ damage and secondary causes of HTN  Orders: -     Basic metabolic panel; Future -     CBC with Differential/Platelet; Future -     TSH; Future -     nebivolol (BYSTOLIC) 5 MG tablet; Take 1 tablet (5 mg total) by mouth daily.  Osteopenia, senile Orders: -     Vit D  25 hydroxy (rtn osteoporosis monitoring); Future -     DG Bone Density; Future  I am having Emily Sweeney maintain her atorvastatin, ramipril, aspirin EC, and nebivolol.  Meds ordered this encounter  Medications  . DISCONTD: nebivolol (BYSTOLIC) 5 MG tablet    Sig: Take 1 tablet (5 mg total) by mouth daily.    Dispense:  42 tablet    Refill:  0  . nebivolol (BYSTOLIC) 5 MG tablet    Sig: Take 1 tablet (5 mg total) by mouth daily.    Dispense:  42 tablet    Refill:  0     Follow-up: Return in about 4 weeks (around 02/09/2015).  Scarlette Calico, MD

## 2015-01-12 NOTE — Patient Instructions (Signed)

## 2015-01-18 ENCOUNTER — Encounter: Payer: Self-pay | Admitting: Gastroenterology

## 2015-01-18 ENCOUNTER — Other Ambulatory Visit: Payer: Self-pay | Admitting: Internal Medicine

## 2015-01-18 DIAGNOSIS — E559 Vitamin D deficiency, unspecified: Secondary | ICD-10-CM

## 2015-01-18 MED ORDER — CHOLECALCIFEROL 1.25 MG (50000 UT) PO TABS: 1.0000 | ORAL_TABLET | ORAL | Status: DC

## 2015-01-20 ENCOUNTER — Other Ambulatory Visit: Payer: Self-pay | Admitting: Internal Medicine

## 2015-01-20 DIAGNOSIS — I10 Essential (primary) hypertension: Secondary | ICD-10-CM

## 2015-01-21 ENCOUNTER — Other Ambulatory Visit (INDEPENDENT_AMBULATORY_CARE_PROVIDER_SITE_OTHER): Payer: Medicare Other

## 2015-01-21 ENCOUNTER — Encounter: Payer: Self-pay | Admitting: Internal Medicine

## 2015-01-21 DIAGNOSIS — I10 Essential (primary) hypertension: Secondary | ICD-10-CM

## 2015-01-21 LAB — BASIC METABOLIC PANEL
BUN: 13 mg/dL (ref 6–23)
CO2: 27 mEq/L (ref 19–32)
Calcium: 9.3 mg/dL (ref 8.4–10.5)
Chloride: 104 mEq/L (ref 96–112)
Creatinine, Ser: 0.78 mg/dL (ref 0.40–1.20)
GFR: 76.36 mL/min (ref >60.00)
GLUCOSE: 121 mg/dL — AB (ref 70–99)
Potassium: 4.7 mEq/L (ref 3.5–5.1)
Sodium: 136 mEq/L (ref 135–145)

## 2015-01-27 ENCOUNTER — Other Ambulatory Visit: Payer: Self-pay | Admitting: Internal Medicine

## 2015-02-17 ENCOUNTER — Ambulatory Visit (INDEPENDENT_AMBULATORY_CARE_PROVIDER_SITE_OTHER): Payer: Medicare Other | Admitting: Internal Medicine

## 2015-02-17 ENCOUNTER — Encounter: Payer: Self-pay | Admitting: Internal Medicine

## 2015-02-17 VITALS — BP 148/78 | HR 75 | Temp 98.7°F | Resp 12 | Ht 66.0 in | Wt 150.0 lb

## 2015-02-17 DIAGNOSIS — I10 Essential (primary) hypertension: Secondary | ICD-10-CM

## 2015-02-17 DIAGNOSIS — R002 Palpitations: Secondary | ICD-10-CM | POA: Diagnosis not present

## 2015-02-17 NOTE — Progress Notes (Signed)
Pre visit review using our clinic review tool, if applicable. No additional management support is needed unless otherwise documented below in the visit note. 

## 2015-02-17 NOTE — Patient Instructions (Signed)

## 2015-02-18 MED ORDER — NEBIVOLOL HCL 5 MG PO TABS: 5.0000 mg | ORAL_TABLET | Freq: Every day | ORAL | Status: DC

## 2015-02-18 NOTE — Progress Notes (Signed)
Subjective:  Patient ID: Emily Sweeney, female    DOB: Dec 30, 1940  Age: 74 y.o. MRN: [id number]  CC: Hypertension   HPI Emily Sweeney presents for a BP check - her BP at home has been well controlled and she feels well. She has not had any more palpitations.  Outpatient Prescriptions Prior to Visit  Medication Sig Dispense Refill  . aspirin EC 81 MG tablet Take 1 tablet (81 mg total) by mouth daily. 90 tablet 1  . atorvastatin (LIPITOR) 20 MG tablet TAKE 1 TABLET ONCE DAILY. 90 tablet 3  . Cholecalciferol 50000 UNITS TABS Take 1 tablet by mouth once a week. 12 tablet 3  . ramipril (ALTACE) 10 MG capsule TAKE (1) CAPSULE DAILY. 90 capsule 3  . nebivolol (BYSTOLIC) 5 MG tablet Take 1 tablet (5 mg total) by mouth daily. 42 tablet 0   No facility-administered medications prior to visit.    ROS Review of Systems  Constitutional: Negative.  Negative for fatigue.  HENT: Negative.   Eyes: Negative.   Respiratory: Negative.  Negative for cough, choking, chest tightness, shortness of breath, wheezing and stridor.   Cardiovascular: Negative.  Negative for chest pain, palpitations and leg swelling.  Gastrointestinal: Negative.  Negative for nausea, vomiting, abdominal pain, diarrhea, constipation and blood in stool.  Endocrine: Negative.   Genitourinary: Negative.   Musculoskeletal: Negative.   Skin: Negative.   Neurological: Negative.  Negative for dizziness, tremors, syncope, weakness and light-headedness.    Objective:  BP 148/78 mmHg  Pulse 75  Temp(Src) 98.7 F (37.1 C) (Oral)  Resp 12  Ht 5\' 6"  (1.676 m)  Wt 150 lb (68.04 kg)  BMI 24.22 kg/m2  SpO2 98%  BP Readings from Last 3 Encounters:  02/17/15 148/78  01/12/15 168/98  10/28/14 148/78    Wt Readings from Last 3 Encounters:  02/17/15 150 lb (68.04 kg)  01/12/15 149 lb (67.586 kg)  10/28/14 148 lb (67.132 kg)    Physical Exam  Constitutional: She is oriented to person, place, and time. No distress.  HENT:   Mouth/Throat: Oropharynx is clear and moist. No oropharyngeal exudate.  Eyes: Conjunctivae are normal. Right eye exhibits no discharge. Left eye exhibits no discharge. No scleral icterus.  Neck: Normal range of motion. Neck supple. No JVD present. No tracheal deviation present. No thyromegaly present.  Cardiovascular: Normal rate, regular rhythm, normal heart sounds and intact distal pulses.  Exam reveals no gallop and no friction rub.   No murmur heard. Pulmonary/Chest: Effort normal and breath sounds normal. No stridor. No respiratory distress. She has no wheezes. She has no rales. She exhibits no tenderness.  Abdominal: Soft. Bowel sounds are normal. She exhibits no distension and no mass. There is no tenderness. There is no rebound and no guarding.  Musculoskeletal: Normal range of motion. She exhibits no edema or tenderness.  Lymphadenopathy:    She has no cervical adenopathy.  Neurological: She is oriented to person, place, and time.  Skin: Skin is warm and dry. No rash noted. She is not diaphoretic. No erythema. No pallor.  Vitals reviewed.   Lab Results  Component Value Date   WBC 8.4 01/12/2015   HGB 13.0 01/12/2015   HCT 39.5 01/12/2015   PLT 195.0 01/12/2015   GLUCOSE 121* 01/21/2015   CHOL 185 01/12/2015   TRIG 82.0 01/12/2015   HDL 80.70 01/12/2015   LDLCALC 88 01/12/2015   ALT 10 10/16/2014   AST 17 10/16/2014   NA 136 01/21/2015   K 4.7  01/21/2015   CL 104 01/21/2015   CREATININE 0.78 01/21/2015   BUN 13 01/21/2015   CO2 27 01/21/2015   TSH 1.42 01/12/2015   HGBA1C 5.7 01/12/2015    No results found.  Assessment & Plan:   Emily Sweeney was seen today for hypertension.  Diagnoses and all orders for this visit:  Essential hypertension- her BP is well controlled, will cont the current meds Orders: -     nebivolol (BYSTOLIC) 5 MG tablet; Take 1 tablet (5 mg total) by mouth daily.  Palpitations- she has not had any recurrence of this, will cont  bystolic Orders: -     nebivolol (BYSTOLIC) 5 MG tablet; Take 1 tablet (5 mg total) by mouth daily.  I am having Emily Sweeney maintain her ramipril, aspirin EC, Cholecalciferol, atorvastatin, and nebivolol.  Meds ordered this encounter  Medications  . nebivolol (BYSTOLIC) 5 MG tablet    Sig: Take 1 tablet (5 mg total) by mouth daily.    Dispense:  90 tablet    Refill:  3     Follow-up: Return in about 6 months (around 08/20/2015).  Scarlette Calico, MD

## 2015-02-19 ENCOUNTER — Ambulatory Visit (AMBULATORY_SURGERY_CENTER): Payer: Self-pay | Admitting: *Deleted

## 2015-02-19 VITALS — Ht 66.0 in | Wt 150.0 lb

## 2015-02-19 DIAGNOSIS — Z1211 Encounter for screening for malignant neoplasm of colon: Secondary | ICD-10-CM

## 2015-02-19 MED ORDER — NA SULFATE-K SULFATE-MG SULF 17.5-3.13-1.6 GM/177ML PO SOLN: 1.0000 | Freq: Once | ORAL | Status: DC

## 2015-02-19 NOTE — Progress Notes (Signed)
No egg or soy allergy. No anesthesia problems.  No home O2.  No diet meds.  

## 2015-02-25 ENCOUNTER — Encounter: Payer: Self-pay | Admitting: Interventional Cardiology

## 2015-02-25 ENCOUNTER — Ambulatory Visit (INDEPENDENT_AMBULATORY_CARE_PROVIDER_SITE_OTHER): Payer: Medicare Other | Admitting: Interventional Cardiology

## 2015-02-25 VITALS — BP 138/68 | HR 76 | Ht 66.0 in | Wt 150.4 lb

## 2015-02-25 DIAGNOSIS — I10 Essential (primary) hypertension: Secondary | ICD-10-CM | POA: Diagnosis not present

## 2015-02-25 DIAGNOSIS — E7800 Pure hypercholesterolemia, unspecified: Secondary | ICD-10-CM

## 2015-02-25 DIAGNOSIS — I493 Ventricular premature depolarization: Secondary | ICD-10-CM

## 2015-02-25 DIAGNOSIS — R0789 Other chest pain: Secondary | ICD-10-CM | POA: Diagnosis not present

## 2015-02-25 NOTE — Progress Notes (Signed)
Patient ID: Jasmen Emrich, female   DOB: 1940-09-13, 74 y.o.   MRN: [id number]     Cardiology Office Note   Date:  02/25/2015   ID:  Lyndzie Zentz, DOB 10-06-1940, MRN [id number]  PCP:  Scarlette Calico, MD    Chief Complaint  Patient presents with  . New Evaluation    flucuating BP's, palpitations, chest tightness and fatigue     Wt Readings from Last 3 Encounters:  02/25/15 150 lb 6.4 oz (68.221 kg)  02/19/15 150 lb (68.04 kg)  02/17/15 150 lb (68.04 kg)       History of Present Illness: Charlyne Robertshaw is a 74 y.o. female  With a h/o HTN who had chest tightness, fatigue and palpitations in 3/16.  She had a negative nuclear study at that time.  Event monitor revealed PVCs but no AFib.  She had edema and amlodipine was stopped.  Edema improved.   She was started in a statin due to high cholesterol.  She had been on CoQ10.  She wonders whether she should continue either of these.    She continues to have fatigue and some chest tightness.  Walking is her most strenuous exercise.  No exertional chest pain.  Tightness comes on at rest.  However in the past few weeks, she has not had any tightness. Eating may bring it on.   She was found to be Vit D deficient.     Past Medical History  Diagnosis Date  . Anemia   . Diverticulosis of colon   . Hyperlipidemia   . Hypertension   . Hearing loss     right ear, and tinnitus  . Blood transfusion without reported diagnosis   . GERD (gastroesophageal reflux disease)     Past Surgical History  Procedure Laterality Date  . Craniectomy for excision of acoustic neuroma    . Appendectomy    . Breast lumpectomy    . Tonsillectomy    . Nasal septum surgery    . Cochlear implant    . Breast biopsy       Current Outpatient Prescriptions  Medication Sig Dispense Refill  . aspirin EC 81 MG tablet Take 1 tablet (81 mg total) by mouth daily. 90 tablet 1  . atorvastatin (LIPITOR) 20 MG tablet TAKE 1 TABLET ONCE DAILY. 90 tablet 3  .  calcium carbonate (TUMS - DOSED IN MG ELEMENTAL CALCIUM) 500 MG chewable tablet Chew 1 tablet by mouth daily.    . Cholecalciferol 50000 UNITS TABS Take 1 tablet by mouth once a week. 12 tablet 3  . Na Sulfate-K Sulfate-Mg Sulf (SUPREP BOWEL PREP) SOLN Take 1 kit by mouth once. Name brand only, suprep as directed, no substitutions 354 mL 0  . naproxen sodium (ANAPROX) 220 MG tablet Take 220 mg by mouth 2 (two) times daily with a meal.    . nebivolol (BYSTOLIC) 5 MG tablet Take 1 tablet (5 mg total) by mouth daily. 90 tablet 3  . ramipril (ALTACE) 10 MG capsule TAKE (1) CAPSULE DAILY. 90 capsule 3   No current facility-administered medications for this visit.    Allergies:   Ceftin; Ciprofloxacin; Penicillins; and Sulfonamide derivatives    Social History:  The patient  reports that she quit smoking about 23 years ago. She has never used smokeless tobacco. She reports that she drinks alcohol. She reports that she does not use illicit drugs.   Family History:  The patient'sfamily history includes Coronary artery disease in her father; Dementia (age of onset:  38) in her mother; Sudden death (age of onset: 19) in her father. There is no history of Colon cancer. No siblings with heart disease   ROS:  Please see the history of present illness.   Otherwise, review of systems are positive for as above.   All other systems are reviewed and negative.    PHYSICAL EXAM: VS:  BP 138/68 mmHg  Pulse 76  Ht $R'5\' 6"'ru$  (1.676 m)  Wt 150 lb 6.4 oz (68.221 kg)  BMI 24.29 kg/m2  SpO2 98% , BMI Body mass index is 24.29 kg/(m^2). GEN: Well nourished, well developed, in no acute distress HEENT: normal Neck: no JVD, carotid bruits, or masses Cardiac: RRR; no murmurs, rubs, or gallops,no edema ; rare premature beats Respiratory:  clear to auscultation bilaterally, normal work of breathing GI: soft, nontender, nondistended, + BS MS: no deformity or atrophy Skin: warm and dry, no rash Neuro:  Strength and  sensation are intact Psych: euthymic mood, full affect   EKG:   The ekg ordered in 3/16 demonstrates NSR, nonspecific ST segment changes   Recent Labs: 10/16/2014: ALT 10 01/12/2015: Hemoglobin 13.0; Platelets 195.0; TSH 1.42 01/21/2015: BUN 13; Creatinine, Ser 0.78; Potassium 4.7; Sodium 136   Lipid Panel    Component Value Date/Time   CHOL 185 01/12/2015 1053   TRIG 82.0 01/12/2015 1053   HDL 80.70 01/12/2015 1053   CHOLHDL 2 01/12/2015 1053   VLDL 16.4 01/12/2015 1053   LDLCALC 88 01/12/2015 1053     Other studies Reviewed: Additional studies/ records that were reviewed today with results demonstrating: stress test, event monitor.   ASSESSMENT AND PLAN:  1. Chest tightness:  She had a negative stress test. No symptoms in the last few weeks. The only other tests that we would pursue would be angiography. Since her symptoms have resolved, would hold off on any type of angiography. If her symptoms return, she will let us know and we could pursue angiography. 2. PVC:  She was having some symptoms with her PVCs. We went over the fact that this is not a dangerous heart rhythm. She did not have atrial fibrillation noted on her monitor. 3. Hyperlipidemia:  Continue current lipid-lowering therapy. Her lipids are well controlled. Off of medication, she has had elevated lipids. 4.  fatigue: This is improved. Unclear etiology. 5.  hypertension: Blood pressure well controlled. Continue current medicines.   Current medicines are reviewed at length with the patient today.  The patient concerns regarding her medicines were addressed.  The following changes have been made:  No change  Labs/ tests ordered today include:  No orders of the defined types were placed in this encounter.    Recommend 150 minutes/week of aerobic exercise Low fat, low carb, high fiber diet recommended  Disposition:   FU in  As needed, she will let us know if symptoms return. If she does have her return of her  chest tightness, would consider cardiac cath.   Teresita Madura., MD  02/25/2015 9:30 AM    [facility] [place], [place], [place]  [address] Phone: [phone]; Fax: [phone]

## 2015-02-25 NOTE — Patient Instructions (Signed)
Medication Instructions:  Same-no change  Labwork: None  Testing/Procedures: None  Follow-Up: Your physician recommends that you schedule a follow-up appointment in: as needed

## 2015-03-05 ENCOUNTER — Ambulatory Visit (AMBULATORY_SURGERY_CENTER): Payer: Medicare Other | Admitting: Gastroenterology

## 2015-03-05 ENCOUNTER — Encounter: Payer: Self-pay | Admitting: Gastroenterology

## 2015-03-05 VITALS — BP 135/77 | HR 69 | Temp 99.1°F | Resp 18 | Ht 66.0 in | Wt 150.0 lb

## 2015-03-05 DIAGNOSIS — K219 Gastro-esophageal reflux disease without esophagitis: Secondary | ICD-10-CM | POA: Diagnosis not present

## 2015-03-05 DIAGNOSIS — D369 Benign neoplasm, unspecified site: Secondary | ICD-10-CM

## 2015-03-05 DIAGNOSIS — Z1211 Encounter for screening for malignant neoplasm of colon: Secondary | ICD-10-CM | POA: Diagnosis not present

## 2015-03-05 DIAGNOSIS — I10 Essential (primary) hypertension: Secondary | ICD-10-CM | POA: Diagnosis not present

## 2015-03-05 DIAGNOSIS — D124 Benign neoplasm of descending colon: Secondary | ICD-10-CM | POA: Diagnosis not present

## 2015-03-05 DIAGNOSIS — D123 Benign neoplasm of transverse colon: Secondary | ICD-10-CM

## 2015-03-05 HISTORY — DX: Benign neoplasm, unspecified site: D36.9

## 2015-03-05 MED ORDER — SODIUM CHLORIDE 0.9 % IV SOLN: 500.0000 mL | INTRAVENOUS | Status: DC

## 2015-03-05 NOTE — Patient Instructions (Signed)
Discharge instructions given. Handouts on polyps,diverticulosis and hemorrhoids. Resume previous medications. YOU HAD AN ENDOSCOPIC PROCEDURE TODAY AT THE [facility]:   Refer to the procedure report that was given to you for any specific questions about what was found during the examination.  If the procedure report does not answer your questions, please call your gastroenterologist to clarify.  If you requested that your care partner not be given the details of your procedure findings, then the procedure report has been included in a sealed envelope for you to review at your convenience later.  YOU SHOULD EXPECT: Some feelings of bloating in the abdomen. Passage of more gas than usual.  Walking can help get rid of the air that was put into your GI tract during the procedure and reduce the bloating. If you had a lower endoscopy (such as a colonoscopy or flexible sigmoidoscopy) you may notice spotting of blood in your stool or on the toilet paper. If you underwent a bowel prep for your procedure, you may not have a normal bowel movement for a few days.  Please Note:  You might notice some irritation and congestion in your nose or some drainage.  This is from the oxygen used during your procedure.  There is no need for concern and it should clear up in a day or so.  SYMPTOMS TO REPORT IMMEDIATELY:   Following lower endoscopy (colonoscopy or flexible sigmoidoscopy):  Excessive amounts of blood in the stool  Significant tenderness or worsening of abdominal pains  Swelling of the abdomen that is new, acute  Fever of 100F or higher   For urgent or emergent issues, a gastroenterologist can be reached at any hour by calling [phone].   DIET: Your first meal following the procedure should be a small meal and then it is ok to progress to your normal diet. Heavy or fried foods are harder to digest and may make you feel nauseous or bloated.  Likewise, meals heavy in dairy and  vegetables can increase bloating.  Drink plenty of fluids but you should avoid alcoholic beverages for 24 hours.  ACTIVITY:  You should plan to take it easy for the rest of today and you should NOT DRIVE or use heavy machinery until tomorrow (because of the sedation medicines used during the test).    FOLLOW UP: Our staff will call the number listed on your records the next business day following your procedure to check on you and address any questions or concerns that you may have regarding the information given to you following your procedure. If we do not reach you, we will leave a message.  However, if you are feeling well and you are not experiencing any problems, there is no need to return our call.  We will assume that you have returned to your regular daily activities without incident.  If any biopsies were taken you will be contacted by phone or by letter within the next 1-3 weeks.  Please call us at [phone] if you have not heard about the biopsies in 3 weeks.    SIGNATURES/CONFIDENTIALITY: You and/or your care partner have signed paperwork which will be entered into your electronic medical record.  These signatures attest to the fact that that the information above on your After Visit Summary has been reviewed and is understood.  Full responsibility of the confidentiality of this discharge information lies with you and/or your care-partner. 

## 2015-03-05 NOTE — Op Note (Signed)
[facility]  [address] [place], [id number]   COLONOSCOPY PROCEDURE REPORT  PATIENT: Tarea, Skillman  MR#: [id number] BIRTHDATE: Jun 11, 1941 , 74  yrs. old GENDER: female ENDOSCOPIST: Ladene Artist, MD, [facility] PROCEDURE DATE:  03/05/2015 PROCEDURE:   Colonoscopy, screening and Colonoscopy with biopsy First Screening Colonoscopy - Avg.  risk and is 76 yrs.  old or older - No.  Prior Negative Screening - Now for repeat screening. 10 or more years since last screening  History of Adenoma - Now for follow-up colonoscopy & has been > or = to 3 yrs.  N/A  Polyps removed today? Yes ASA CLASS:   Class II INDICATIONS:Screening for colonic neoplasia and Colorectal Neoplasm Risk Assessment for this procedure is average risk. MEDICATIONS: Monitored anesthesia care and Propofol 200 mg IV DESCRIPTION OF PROCEDURE:   After the risks benefits and alternatives of the procedure were thoroughly explained, informed consent was obtained.  The digital rectal exam revealed no abnormalities of the rectum.   The LB [id number]  endoscope was introduced through the anus and advanced to the cecum, which was identified by both the appendix and ileocecal valve. No adverse events experienced.   The quality of the prep was excellent. (Suprep was used)  The instrument was then slowly withdrawn as the colon was fully examined. Estimated blood loss is zero unless otherwise noted in this procedure report.  COLON FINDINGS: Four sessile polyps measuring 3-4 mm in size were found in the descending colon and transverse colon.  Polypectomies were performed with cold forceps.  The resection was complete, the polyp tissue was completely retrieved and sent to histology. There was moderate diverticulosis noted in the descending colon, sigmoid colon, ascending colon, and transverse colon with associated luminal narrowing, colonic spasm and muscular hypertrophy.   The examination was otherwise  normal.  Retroflexed views revealed internal Grade I hemorrhoids. The time to cecum = 3.3 Withdrawal time = 8.9   The scope was withdrawn and the procedure completed. COMPLICATIONS: There were no immediate complications.  ENDOSCOPIC IMPRESSION: 1.   Four sessile polyps in the descending colon and transverse colon; polypectomies performed with cold forceps 2.   Moderate diverticulosis in the descending colon, sigmoid colon, ascending colon, and transverse colon 3.   Grade l internal hemorrhoids  RECOMMENDATIONS: 1.  Await pathology results 2.  High fiber diet with liberal fluid intake. 3.  Repeat colonoscopy in 5 years if 3 or 4 polyps are adenomatous, otherwise, given your age 74, you will not need another colonoscopy for colon cancer screening or polyp surveillance.  These types of tests usually stop around the age 74.  eSigned:  Ladene Artist, MD, [facility] 03/05/2015 3:23 PM

## 2015-03-05 NOTE — Progress Notes (Signed)
Called to room to assist during endoscopic procedure.  Patient ID and intended procedure confirmed with present staff. Received instructions for my participation in the procedure from the performing physician.  

## 2015-03-05 NOTE — Progress Notes (Signed)
Report to PACU, RN, vss, BBS= Clear.  

## 2015-03-08 ENCOUNTER — Telehealth: Payer: Self-pay

## 2015-03-08 LAB — HM COLONOSCOPY

## 2015-03-08 NOTE — Telephone Encounter (Signed)
  Follow up Call-  Call back number 03/05/2015  Post procedure Call Back phone  # [phone]  Permission to leave phone message Yes     Patient questions:  Do you have a fever, pain , or abdominal swelling? No. Pain Score  0 *  Have you tolerated food without any problems? Yes.    Have you been able to return to your normal activities? Yes.    Do you have any questions about your discharge instructions: Diet   No. Medications  No. Follow up visit  No.  Do you have questions or concerns about your Care? No.  Actions: * If pain score is 4 or above: No action needed, pain <4.

## 2015-03-09 ENCOUNTER — Encounter: Payer: Self-pay | Admitting: Gastroenterology

## 2015-03-17 ENCOUNTER — Ambulatory Visit (INDEPENDENT_AMBULATORY_CARE_PROVIDER_SITE_OTHER)
Admission: RE | Admit: 2015-03-17 | Discharge: 2015-03-17 | Disposition: A | Discharge status: Home / Self Care | Payer: Medicare Other | Source: Ambulatory Visit | Attending: Internal Medicine | Admitting: Internal Medicine

## 2015-03-17 ENCOUNTER — Encounter: Payer: Self-pay | Admitting: Internal Medicine

## 2015-03-17 DIAGNOSIS — M8580 Other specified disorders of bone density and structure, unspecified site: Secondary | ICD-10-CM

## 2015-03-17 DIAGNOSIS — M899 Disorder of bone, unspecified: Secondary | ICD-10-CM

## 2015-03-17 LAB — HM DEXA SCAN: HM Dexa Scan: -1.4

## 2015-03-17 NOTE — Addendum Note (Signed)
Addended by: Janith Lima on: 03/17/2015 10:31 AM   Modules accepted: Miquel Dunn

## 2015-04-20 DIAGNOSIS — H04221 Epiphora due to insufficient drainage, right lacrimal gland: Secondary | ICD-10-CM | POA: Diagnosis not present

## 2015-04-29 ENCOUNTER — Other Ambulatory Visit: Payer: Self-pay | Admitting: Internal Medicine

## 2015-05-12 ENCOUNTER — Telehealth: Payer: Self-pay | Admitting: *Deleted

## 2015-05-12 DIAGNOSIS — I10 Essential (primary) hypertension: Secondary | ICD-10-CM

## 2015-05-12 DIAGNOSIS — R002 Palpitations: Secondary | ICD-10-CM

## 2015-05-12 MED ORDER — NEBIVOLOL HCL 5 MG PO TABS: 5.0000 mg | ORAL_TABLET | Freq: Every day | ORAL | Status: DC

## 2015-05-12 NOTE — Telephone Encounter (Signed)
Left msg on triage stating md was giving her samples of Bystolic. She is out and needing rx sent to [facility]. Notified pt rx has been sent to [facility]...Johny Chess

## 2015-05-17 ENCOUNTER — Telehealth: Payer: Self-pay | Admitting: Gastroenterology

## 2015-05-18 ENCOUNTER — Encounter: Payer: Self-pay | Admitting: *Deleted

## 2015-05-18 NOTE — Telephone Encounter (Signed)
Patient reports one week of painful gas, urgent BM, rectal bleeding and just a general change in her bowel habits.  She will come in and see Nicoletta Ba PA on 05/20/15 at 2:30

## 2015-05-20 ENCOUNTER — Ambulatory Visit (INDEPENDENT_AMBULATORY_CARE_PROVIDER_SITE_OTHER): Payer: Medicare Other | Admitting: Physician Assistant

## 2015-05-20 ENCOUNTER — Encounter: Payer: Self-pay | Admitting: Physician Assistant

## 2015-05-20 ENCOUNTER — Other Ambulatory Visit (INDEPENDENT_AMBULATORY_CARE_PROVIDER_SITE_OTHER): Payer: Medicare Other

## 2015-05-20 VITALS — BP 128/80 | HR 84 | Temp 99.1°F | Ht 64.0 in | Wt 150.4 lb

## 2015-05-20 DIAGNOSIS — K625 Hemorrhage of anus and rectum: Secondary | ICD-10-CM | POA: Diagnosis not present

## 2015-05-20 DIAGNOSIS — R1032 Left lower quadrant pain: Secondary | ICD-10-CM | POA: Diagnosis not present

## 2015-05-20 DIAGNOSIS — K5730 Diverticulosis of large intestine without perforation or abscess without bleeding: Secondary | ICD-10-CM | POA: Diagnosis not present

## 2015-05-20 LAB — CBC WITH DIFFERENTIAL/PLATELET
BASOS PCT: 0.4 % (ref 0.0–3.0)
Basophils Absolute: 0 10*3/uL (ref 0.0–0.1)
EOS ABS: 0.1 10*3/uL (ref 0.0–0.7)
Eosinophils Relative: 0.9 % (ref 0.0–5.0)
HCT: 34.9 % — ABNORMAL LOW (ref 36.0–46.0)
Hemoglobin: 11.3 g/dL — ABNORMAL LOW (ref 12.0–15.0)
LYMPHS ABS: 1.8 10*3/uL (ref 0.7–4.0)
Lymphocytes Relative: 23 % (ref 12.0–46.0)
MCHC: 32.4 g/dL (ref 30.0–36.0)
MCV: 87.7 fl (ref 78.0–100.0)
MONO ABS: 0.8 10*3/uL (ref 0.1–1.0)
Monocytes Relative: 9.4 % (ref 3.0–12.0)
NEUTROS ABS: 5.3 10*3/uL (ref 1.4–7.7)
NEUTROS PCT: 66.3 % (ref 43.0–77.0)
PLATELETS: 258 10*3/uL (ref 150.0–400.0)
RBC: 3.98 Mil/uL (ref 3.87–5.11)
RDW: 15.3 % (ref 11.5–15.5)
WBC: 8 10*3/uL (ref 4.0–10.5)

## 2015-05-20 LAB — BASIC METABOLIC PANEL
BUN: 14 mg/dL (ref 6–23)
CALCIUM: 9.9 mg/dL (ref 8.4–10.5)
CO2: 28 mEq/L (ref 19–32)
Chloride: 105 mEq/L (ref 96–112)
Creatinine, Ser: 0.82 mg/dL (ref 0.40–1.20)
GFR: 72.02 mL/min (ref >60.00)
Glucose, Bld: 122 mg/dL — ABNORMAL HIGH (ref 70–99)
POTASSIUM: 5 meq/L (ref 3.5–5.1)
SODIUM: 141 meq/L (ref 135–145)

## 2015-05-20 MED ORDER — DICYCLOMINE HCL 10 MG PO CAPS: 10.0000 mg | ORAL_CAPSULE | Freq: Three times a day (TID) | ORAL | Status: DC

## 2015-05-20 NOTE — Patient Instructions (Addendum)
Please go to the basement level to have your labs drawn.  We sent a prescription to [facility] for Bentyl ( Dicyclomine) 10 mg.    You have been scheduled for a CT scan of the abdomen and pelvis at [facility] ([address]---this is in the same building as Press photographer).   You are scheduled on  Friday 05-21-2015  at 8:45 am. You should arrive at 8:30 am to your appointment time for registration. Please follow the written instructions below on the day of your exam:  WARNING: IF YOU ARE ALLERGIC TO IODINE/X-RAY DYE, PLEASE NOTIFY RADIOLOGY IMMEDIATELY AT [phone]! YOU WILL BE GIVEN A 13 HOUR PREMEDICATION PREP.  1) Do not eat or drink anything after 4:45 am (4 hours prior to your test) 2) You have been given 2 bottles of oral contrast to drink. The solution may taste better if refrigerated, but do NOT add ice or any other liquid to this solution. Shake well before drinking.    Drink 1 bottle of contrast @ 6:45 am  (2 hours prior to your exam)  Drink 1 bottle of contrast @ 7:45 am (1 hour prior to your exam)  You may take any medications as prescribed with a small amount of water except for the following: Metformin, Glucophage, Glucovance, Avandamet, Riomet, Fortamet, Actoplus Met, Janumet, Glumetza or Metaglip. The above medications must be held the day of the exam AND 48 hours after the exam.  The purpose of you drinking the oral contrast is to aid in the visualization of your intestinal tract. The contrast solution may cause some diarrhea. Before your exam is started, you will be given a small amount of fluid to drink. Depending on your individual set of symptoms, you may also receive an intravenous injection of x-ray contrast/dye. Plan on being at [facility] for 30 minutes or long, depending on the type of exam you are having performed.  If you have any questions regarding your exam or if you need to reschedule, you may call the CT department at [phone]  between the hours of 8:00 am and 5:00 pm, Monday-Friday.  ________________________________________________________________________

## 2015-05-20 NOTE — Progress Notes (Signed)
Patient ID: Emily Sweeney, female   DOB: [birth year], 74 y.o.   MRN: [id number]   Subjective:    Patient ID: Emily Sweeney, female    DOB: [birth year], 74 y.o.   MRN: [id number]  HPI  Emily Sweeney is a pleasant 74 year old white female known to Dr. Fuller Plan. She comes in today with complaints of lower abdominal pain intermittent rectal bleeding and low-grade fever. She had undergone colonoscopy in July 2016. She had 4 polyps removed at that time 1 was a tubular adenoma and 3 were benign lymphoid polyps. She was also noted to have moderate pan diverticulosis and grade 1 internal hemorrhoids. Patient says her current symptoms have been present for about 3 weeks. She has had primarily lower abdominal pain and pressure in the suprapubic area and left lower quadrant has also noted some intermittent sharp pains in her right upper quadrant. She says the lower abdominal pain has been severe at times and has woken her up from sleep. She has not had any chills or sweats but has had a low-grade temp earlier this week 99 7 by her report. Her appetite is been off a bit but no nausea or vomiting. She is having bowel movements with increase in mucus and has seen some blood mixed in with her stools. Bowel movements have been fairly normal she has noticed some increased cramping in the rectum and lower abdomen after bowel movements. She has had prior diverticulitis but says it was several years ago. He denies any current dysuria urgency frequency or hematuria.  Review of Systems Pertinent positive and negative review of systems were noted in the above HPI section.  All other review of systems was otherwise negative.  Outpatient Encounter Prescriptions as of 05/20/2015  Medication Sig  . aspirin EC 81 MG tablet Take 1 tablet (81 mg total) by mouth daily.  Marland Kitchen atorvastatin (LIPITOR) 20 MG tablet TAKE 1 TABLET ONCE DAILY.  . Calcium Carb-Cholecalciferol (CALCIUM 600 + D PO) Take 1-2 tablets by mouth daily.  . calcium  carbonate (TUMS - DOSED IN MG ELEMENTAL CALCIUM) 500 MG chewable tablet Chew 1 tablet by mouth daily.  . naproxen sodium (ANAPROX) 220 MG tablet Take 220 mg by mouth 2 (two) times daily with a meal.  . nebivolol (BYSTOLIC) 5 MG tablet Take 1 tablet (5 mg total) by mouth daily.  . ramipril (ALTACE) 10 MG capsule TAKE (1) CAPSULE DAILY.  Marland Kitchen VITAMIN D, CHOLECALCIFEROL, PO Take 1 tablet by mouth daily.  . [DISCONTINUED] Cholecalciferol 50000 UNITS TABS Take 1 tablet by mouth once a week.  . dicyclomine (BENTYL) 10 MG capsule Take 1 capsule (10 mg total) by mouth 3 (three) times daily before meals. 1/2 hour before meals   No facility-administered encounter medications on file as of 05/20/2015.   Allergies  Allergen Reactions  . Ceftin [Cefuroxime Axetil] Hives and Rash  . Ciprofloxacin Rash  . Penicillins Hives and Rash  . Sulfonamide Derivatives Hives and Rash   Patient Active Problem List   Diagnosis Date Noted  . PVC (premature ventricular contraction) 02/25/2015  . Osteopenia, senile 01/12/2015  . Vitamin D deficiency 01/12/2015  . Palpitations 10/28/2014  . Nonspecific abnormal electrocardiogram (ECG) (EKG) 10/16/2014  . Hyperglycemia 12/18/2011  . Other screening mammogram 12/18/2011  . DIVERTICULITIS OF COLON 10/11/2009  . HYPERCHOLESTEROLEMIA, PURE 10/21/2008  . Essential hypertension 10/21/2008   Social History   Social History  . Marital Status: Divorced    Spouse Name: N/A  . Number of Children: N/A  . Years of  Education: N/A   Occupational History  . psychologist    Social History Main Topics  . Smoking status: Former Smoker    Quit date: 07/04/1991  . Smokeless tobacco: Never Used  . Alcohol Use: 0.0 oz/week    0 Standard drinks or equivalent per week     Comment: occasionally  . Drug Use: No  . Sexual Activity: Not Currently   Other Topics Concern  . Not on file   Social History Narrative    Emily Sweeney's family history includes Coronary artery disease in  her father; Dementia (age of onset: 74) in her mother; Sudden death (age of onset: 65) in her father. There is no history of Colon cancer.      Objective:    Filed Vitals:   05/20/15 1432  BP: 128/80  Pulse: 84  Temp: 99.1 F (37.3 C)      Physical Exam  well-developed elderly white female in no acute distress, pleasant blood pressure 128/80 pulse 84 temperature 99.1 height 5 foot 4 weight 150. HEENT; nontraumatic normocephalic EOMI PERRLA sclerae anicteric, Cardiovascular; regular rate and rhythm with S1-S2 no murmur or gallop, Pulmonary ;clear bilaterally, Abdomen; soft bowel sounds are active she is tender in the left lower quadrant and suprapubic area is also mild tenderness in the right upper quadrant there is no guarding or rebound no palpable mass or hepatosplenomegaly, Rectal; exam not done, Ext; no clubbing cyanosis or edema skin warm and dry, Neuropsych; mood and affect appropriate       Assessment & Plan:   #1 74 yo female with 2-3 week hx of lower abdominal pain, low grade fever, and intermittent BRB in stool I suspect she has diverticulitis but with bleeding will r/o other inflammatory process  She may have SCAD-segmental colitis associated with diverticulitis. #2 HTN #3 multiple antibiotic allergies  Plan; Labs today  Ct abd/pelvis within next 24 hours  Start bentyl 10 mg tid prn  Will hold on antibiotics until Ct reviewed tomorrow  may add Lialda as well  Pending Ct findings   Emily Riviera Genia Harold PA-C 05/20/2015   Cc: Janith Lima, MD

## 2015-05-21 ENCOUNTER — Encounter: Payer: Self-pay | Admitting: Physician Assistant

## 2015-05-21 ENCOUNTER — Ambulatory Visit (INDEPENDENT_AMBULATORY_CARE_PROVIDER_SITE_OTHER)
Admission: RE | Admit: 2015-05-21 | Discharge: 2015-05-21 | Disposition: A | Discharge status: Home / Self Care | Payer: Medicare Other | Source: Ambulatory Visit | Attending: Physician Assistant | Admitting: Physician Assistant

## 2015-05-21 DIAGNOSIS — R1032 Left lower quadrant pain: Secondary | ICD-10-CM

## 2015-05-21 DIAGNOSIS — K625 Hemorrhage of anus and rectum: Secondary | ICD-10-CM

## 2015-05-23 NOTE — Progress Notes (Signed)
Reviewed and agree with management plan.  Janann Boeve T. Zaquan Duffner, MD FACG 

## 2015-05-24 ENCOUNTER — Other Ambulatory Visit: Payer: Self-pay

## 2015-05-24 MED ORDER — GLYCOPYRROLATE 2 MG PO TABS: 2.0000 mg | ORAL_TABLET | Freq: Every day | ORAL | Status: DC

## 2015-05-24 MED ORDER — MESALAMINE 1.2 G PO TBEC: 1.2000 g | DELAYED_RELEASE_TABLET | Freq: Every day | ORAL | Status: DC

## 2015-05-26 ENCOUNTER — Telehealth: Payer: Self-pay | Admitting: Interventional Cardiology

## 2015-05-26 NOTE — Telephone Encounter (Signed)
There was some plaque buildup in the coronary arteries but based on her most recent stress test, this is not likely limiting blood flow.  That was the one cardiac finding mentioned.  WOuld continue with the current plan of treatment, as long as she is remaining symptoms free.

## 2015-05-26 NOTE — Telephone Encounter (Signed)
Spoke with pt who is calling about CT scan. This was ordered by GI but showed advanced atherosclerosis, including within the coronary arteries. (Scan is in EPIC).  Pt is asking for Dr. Irish Lack to review and give his recommendations.  Call back number is work number but pt can also be reached on her cell phone.

## 2015-05-26 NOTE — Telephone Encounter (Signed)
New problem    Pt want Dr Irish Lack to view her CT she had done in epic and get back with her concerning findings.

## 2015-05-27 NOTE — Telephone Encounter (Signed)
LMTCB

## 2015-05-28 NOTE — Telephone Encounter (Signed)
**Note De-Identified  Obfuscation** The pt is advised and she verbalized understanding. She states that she has been having left arm pain for the past few days. She describes the pain as "feels like a spasm". She rates the pain at 7 on a 1-10 scale and states that she also has weakness in that arm. She reports that she has not discussed her left arm pain and weakness with her PCP. She wants to know if the plaque buildup in her arteries could cause this. Please advise.

## 2015-05-28 NOTE — Telephone Encounter (Signed)
**Note De-Identified  Obfuscation** The pt is advised and she verbalized understanding. She states that she will call back if her left arm pain and weakness continue.

## 2015-05-28 NOTE — Telephone Encounter (Signed)
Follow up ° ° ° ° ° °Returning a call to the nurse to get lab results °

## 2015-05-28 NOTE — Telephone Encounter (Signed)
No severe plaque detected by stress test so she should not have left arm sx from this.  Stress tests are not perfect.  If her sx persist, could consider doing a heart cath to be definitive.

## 2015-06-01 ENCOUNTER — Emergency Department (HOSPITAL_COMMUNITY)
Admission: EM | Admit: 2015-06-01 | Discharge: 2015-06-01 | Disposition: A | Discharge status: Home / Self Care | Payer: Medicare Other | Attending: Emergency Medicine | Admitting: Emergency Medicine

## 2015-06-01 ENCOUNTER — Ambulatory Visit (INDEPENDENT_AMBULATORY_CARE_PROVIDER_SITE_OTHER): Payer: Medicare Other

## 2015-06-01 ENCOUNTER — Encounter (HOSPITAL_COMMUNITY): Payer: Self-pay | Admitting: Nurse Practitioner

## 2015-06-01 ENCOUNTER — Ambulatory Visit (INDEPENDENT_AMBULATORY_CARE_PROVIDER_SITE_OTHER): Payer: Medicare Other | Admitting: Family Medicine

## 2015-06-01 VITALS — BP 150/80 | HR 86 | Temp 98.6°F | Resp 18 | Ht 64.0 in | Wt 149.0 lb

## 2015-06-01 DIAGNOSIS — Z7982 Long term (current) use of aspirin: Secondary | ICD-10-CM | POA: Diagnosis not present

## 2015-06-01 DIAGNOSIS — M7981 Nontraumatic hematoma of soft tissue: Secondary | ICD-10-CM | POA: Diagnosis not present

## 2015-06-01 DIAGNOSIS — Z87891 Personal history of nicotine dependence: Secondary | ICD-10-CM | POA: Insufficient documentation

## 2015-06-01 DIAGNOSIS — Z880 Allergy status to penicillin: Secondary | ICD-10-CM | POA: Insufficient documentation

## 2015-06-01 DIAGNOSIS — Z79899 Other long term (current) drug therapy: Secondary | ICD-10-CM | POA: Insufficient documentation

## 2015-06-01 DIAGNOSIS — M79622 Pain in left upper arm: Secondary | ICD-10-CM | POA: Diagnosis not present

## 2015-06-01 DIAGNOSIS — S40022A Contusion of left upper arm, initial encounter: Secondary | ICD-10-CM | POA: Diagnosis not present

## 2015-06-01 DIAGNOSIS — Z862 Personal history of diseases of the blood and blood-forming organs and certain disorders involving the immune mechanism: Secondary | ICD-10-CM | POA: Diagnosis not present

## 2015-06-01 DIAGNOSIS — M25512 Pain in left shoulder: Secondary | ICD-10-CM

## 2015-06-01 DIAGNOSIS — K219 Gastro-esophageal reflux disease without esophagitis: Secondary | ICD-10-CM | POA: Diagnosis not present

## 2015-06-01 DIAGNOSIS — H9191 Unspecified hearing loss, right ear: Secondary | ICD-10-CM | POA: Insufficient documentation

## 2015-06-01 DIAGNOSIS — Z791 Long term (current) use of non-steroidal anti-inflammatories (NSAID): Secondary | ICD-10-CM | POA: Diagnosis not present

## 2015-06-01 DIAGNOSIS — E785 Hyperlipidemia, unspecified: Secondary | ICD-10-CM | POA: Insufficient documentation

## 2015-06-01 DIAGNOSIS — I10 Essential (primary) hypertension: Secondary | ICD-10-CM | POA: Diagnosis not present

## 2015-06-01 DIAGNOSIS — T148XXA Other injury of unspecified body region, initial encounter: Secondary | ICD-10-CM

## 2015-06-01 DIAGNOSIS — R2232 Localized swelling, mass and lump, left upper limb: Secondary | ICD-10-CM | POA: Diagnosis present

## 2015-06-01 DIAGNOSIS — Z86010 Personal history of colonic polyps: Secondary | ICD-10-CM | POA: Insufficient documentation

## 2015-06-01 LAB — BASIC METABOLIC PANEL
ANION GAP: 7 (ref 5–15)
BUN: 19 mg/dL (ref 6–20)
CHLORIDE: 103 mmol/L (ref 101–111)
CO2: 25 mmol/L (ref 22–32)
Calcium: 9.5 mg/dL (ref 8.9–10.3)
Creatinine, Ser: 0.93 mg/dL (ref 0.44–1.00)
GFR calc Af Amer: >60 mL/min (ref >60)
GFR calc non Af Amer: 58 mL/min — ABNORMAL LOW (ref >60)
GLUCOSE: 139 mg/dL — AB (ref 65–99)
POTASSIUM: 4.1 mmol/L (ref 3.5–5.1)
Sodium: 135 mmol/L (ref 135–145)

## 2015-06-01 LAB — CBC
HEMATOCRIT: 31.4 % — AB (ref 36.0–46.0)
HEMOGLOBIN: 10.1 g/dL — AB (ref 12.0–15.0)
MCH: 28.1 pg (ref 26.0–34.0)
MCHC: 32.2 g/dL (ref 30.0–36.0)
MCV: 87.2 fL (ref 78.0–100.0)
Platelets: 241 10*3/uL (ref 150–400)
RBC: 3.6 MIL/uL — AB (ref 3.87–5.11)
RDW: 14.2 % (ref 11.5–15.5)
WBC: 11.7 10*3/uL — AB (ref 4.0–10.5)

## 2015-06-01 NOTE — ED Notes (Addendum)
Pt has a diffuse left arm/bicep area echymosis originating form what she reports as an IV access on the 7th of this month. Reports throbbing pain 8/10 at the affected extremity. Denies being on anticoagulants or having a clotting disorder.

## 2015-06-01 NOTE — Progress Notes (Signed)
Subjective:    Patient ID: Emily Sweeney, female    DOB: [birth year], 74 y.o.   MRN: [id number] This chart was scribed for Merri Ray, MD by Marti Sleigh, Medical Scribe. This patient was seen in Room 8 and the patient's care was started a 5:44 PM.  Chief Complaint  Patient presents with  . Arm Pain    Left onset 4 days    HPI HPI Comments: Emily Sweeney is a 74 y.o. female who presents to [facility] complaining of bruising and soreness after IV contrast was administered 11 days ago. She states she has had increased swelling and bruising since that time which has significantly worsened today. She notes some pain in her left neck several days ago. She denies chest pain or SOB. She denies numbness or tingling in either arm. She endorses weakness in left arm since her CT scan. She denies fever.  Few attempts to put contrast in the left arm, but ultimately got the contrast in the right arm. She took aleve yesterday for her pain. She also states around the same time that she had her CT scan she took her dog for a walk and he pulled her arm, and she had some pain at that time.   She had a CT abdomen pelvis on October 7th with contrast. Phone note on October 14th, indicated left arm pain for a few days, 7/10 pain with reported weakness. Her CT scan was ordered by GI but did note atherosclerosis. Per phone note from Dr. Irish Lack, there was plaque in the coronary arteries but based on results from most recent stress test, not likely limiting blood flow.   Patient Active Problem List   Diagnosis Date Noted  . PVC (premature ventricular contraction) 02/25/2015  . Osteopenia, senile 01/12/2015  . Vitamin D deficiency 01/12/2015  . Palpitations 10/28/2014  . Nonspecific abnormal electrocardiogram (ECG) (EKG) 10/16/2014  . Hyperglycemia 12/18/2011  . Other screening mammogram 12/18/2011  . DIVERTICULITIS OF COLON 10/11/2009  . HYPERCHOLESTEROLEMIA, PURE 10/21/2008  . Essential hypertension  10/21/2008   Past Medical History  Diagnosis Date  . Anemia   . Diverticulosis of colon   . Hyperlipidemia   . Hypertension   . Hearing loss     right ear, and tinnitus  . Blood transfusion without reported diagnosis   . GERD (gastroesophageal reflux disease)   . Tubular adenoma 03/05/2015    Polyp, and Benign lymphoid polyp.   Past Surgical History  Procedure Laterality Date  . Craniectomy for excision of acoustic neuroma    . Appendectomy    . Breast lumpectomy    . Tonsillectomy    . Nasal septum surgery    . Cochlear implant    . Breast biopsy     Allergies  Allergen Reactions  . Ceftin [Cefuroxime Axetil] Hives and Rash  . Ciprofloxacin Rash  . Penicillins Hives and Rash  . Sulfonamide Derivatives Hives and Rash   Prior to Admission medications   Medication Sig Start Date End Date Taking? Authorizing Provider  aspirin EC 81 MG tablet Take 1 tablet (81 mg total) by mouth daily. 10/16/14  Yes Janith Lima, MD  atorvastatin (LIPITOR) 20 MG tablet TAKE 1 TABLET ONCE DAILY. 01/27/15  Yes Janith Lima, MD  Calcium Carb-Cholecalciferol (CALCIUM 600 + D PO) Take 1-2 tablets by mouth daily.   Yes Historical Provider, MD  calcium carbonate (TUMS - DOSED IN MG ELEMENTAL CALCIUM) 500 MG chewable tablet Chew 1 tablet by mouth daily.   Yes  Historical Provider, MD  dicyclomine (BENTYL) 10 MG capsule Take 1 capsule (10 mg total) by mouth 3 (three) times daily before meals. 1/2 hour before meals 05/20/15  Yes Amy S Esterwood, PA-C  glycopyrrolate (ROBINUL) 2 MG tablet Take 1 tablet (2 mg total) by mouth daily. 05/24/15  Yes Amy S Esterwood, PA-C  mesalamine (LIALDA) 1.2 G EC tablet Take 1 tablet (1.2 g total) by mouth daily with breakfast. 05/24/15  Yes Amy S Esterwood, PA-C  naproxen sodium (ANAPROX) 220 MG tablet Take 220 mg by mouth 2 (two) times daily with a meal.   Yes Historical Provider, MD  nebivolol (BYSTOLIC) 5 MG tablet Take 1 tablet (5 mg total) by mouth daily. 05/12/15  Yes  Janith Lima, MD  ramipril (ALTACE) 10 MG capsule TAKE (1) CAPSULE DAILY. 04/30/15  Yes Janith Lima, MD  VITAMIN D, CHOLECALCIFEROL, PO Take 1 tablet by mouth daily.   Yes Historical Provider, MD   Social History   Social History  . Marital Status: Divorced    Spouse Name: N/A  . Number of Children: N/A  . Years of Education: N/A   Occupational History  . psychologist    Social History Main Topics  . Smoking status: Former Smoker    Quit date: 07/04/1991  . Smokeless tobacco: Never Used  . Alcohol Use: 0.0 oz/week    0 Standard drinks or equivalent per week     Comment: occasionally  . Drug Use: No  . Sexual Activity: Not Currently   Other Topics Concern  . Not on file   Social History Narrative    Review of Systems  Constitutional: Negative for fever and chills.  Musculoskeletal: Positive for joint swelling and arthralgias.  Skin: Positive for color change. Negative for wound.       Objective:   Physical Exam  Constitutional: She is oriented to person, place, and time. She appears well-developed and well-nourished. No distress.  HENT:  Head: Normocephalic and atraumatic.  Eyes: Pupils are equal, round, and reactive to light.  Neck: Neck supple.  Cardiovascular: Normal rate.   Radial pulse on left is 2+. Normal allem's test.   Pulmonary/Chest: Effort normal. No respiratory distress.  Musculoskeletal: Normal range of motion.  Measured at the mid aspect 10 cm proximal to the lateral epicondyle, upper arm circumference: 26cm on right, 30 cm on left. Discomfort with arm elevation in left shoulder. Equal ROM in bilateral arms. Pain and slight weakness with empty can.   Neurological: She is alert and oriented to person, place, and time. Coordination normal.  Skin: Skin is warm and dry. She is not diaphoretic.  On left arm there is a large ecchymotic area on the anterior upper arm measures, 10cm/10cm with slight firm component in the mid aspect and medial aspect. No  apparent surrounding erythema. No distal ecchymosis into the forearm or hand. Cap refill less than 1 sec. Equal grip strength. Strength with pain with biceps testing. Triceps strength intact without pain.   Psychiatric: She has a normal mood and affect. Her behavior is normal.  Nursing note and vitals reviewed.   Filed Vitals:   06/01/15 1530  BP: 150/80  Pulse: 86  Temp: 98.6 F (37 C)  TempSrc: Oral  Resp: 18  Height: 5\' 4"  (1.626 m)  Weight: 149 lb (67.586 kg)  SpO2: 98%   [facility] reading (PRIMARY) by  Dr. Carlota Raspberry. Left humerus, no apparent fracture or other body findings. Left shoulder: no apparent fracture, some degenerative changes noted.  Assessment & Plan:  Emily Sweeney is a 74 y.o. female Left upper arm pain - Plan: DG Humerus Left  Left shoulder pain - Plan: DG Shoulder Left  Possible strain of upper arm/biceps, or thrombophlebitis after previous IV, but with acute upper arm swelling and bruising in a number of hours  - DDX vascular cause such as UE DVT.  NVI distally, less likely arterial, but will have evaluated in ER after leaving office. Charge nurse advised at [employer] .    No orders of the defined types were placed in this encounter.   Patient Instructions  The pain in your upper arm over the past week or so may have been due to a muscle pull from walking the dog, but the acute swelling and bruising of your upper arm tonight may be a secondary condition form one of the blood vessels of the arm.   Go to [facility] emergency room tonight for further evaluation given how quick the swelling and bruising came on today. They can decide if other testing needed tonight or if ultrasound in the morning is ok.   Follow up with [place] if needed for further workup.   Return to the clinic or go to the nearest emergency room if any of your symptoms worsen or new symptoms occur.       I personally performed the services described in this documentation, which was  scribed in my presence. The recorded information has been reviewed and considered, and addended by me as needed.   By signing my name below, I, Judithe Modest, attest that this documentation has been prepared under the direction and in the presence of Merri Ray, MD. Electronically Signed: Judithe Modest, ER Scribe. 06/01/2015. 5:44 PM.

## 2015-06-01 NOTE — ED Provider Notes (Signed)
CSN: [id number]     Arrival date & time 06/01/15  2028 History   First MD Initiated Contact with Patient 06/01/15 2103     Chief Complaint  Patient presents with  . Arms Swelling/Phlebitis      (Consider location/radiation/quality/duration/timing/severity/associated sxs/prior Treatment) HPI  Pt presenting with c/o swelling in left arm.  She states that she had an IV attemtped but not placed in the left arm several days ago.  Today she noted her upper arm was swollen with bruising- this has worsened througout the day today.  No new injuries or trauma.  Does not take blood thinners.  Pt was seen at urgent care and referred to the ED for further evaluation.  She has not had any treatment for her symptoms prior to arrival.  There are no other associated systemic symptoms, there are no other alleviating or modifying factors.   Past Medical History  Diagnosis Date  . Anemia   . Diverticulosis of colon   . Hyperlipidemia   . Hypertension   . Hearing loss     right ear, and tinnitus  . Blood transfusion without reported diagnosis   . GERD (gastroesophageal reflux disease)   . Tubular adenoma 03/05/2015    Polyp, and Benign lymphoid polyp.   Past Surgical History  Procedure Laterality Date  . Craniectomy for excision of acoustic neuroma    . Appendectomy    . Breast lumpectomy    . Tonsillectomy    . Nasal septum surgery    . Cochlear implant    . Breast biopsy     Family History  Problem Relation Age of Onset  . Sudden death Father 63    MI  . Coronary artery disease Father   . Dementia Mother 73  . Colon cancer Neg Hx    Social History  Substance Use Topics  . Smoking status: Former Smoker    Quit date: 07/04/1991  . Smokeless tobacco: Never Used  . Alcohol Use: 0.0 oz/week    0 Standard drinks or equivalent per week     Comment: occasionally   OB History    No data available     Review of Systems  ROS reviewed and all otherwise negative except for mentioned in  HPI    Allergies  Ceftin; Ciprofloxacin; Penicillins; and Sulfonamide derivatives  Home Medications   Prior to Admission medications   Medication Sig Start Date End Date Taking? Authorizing Provider  aspirin EC 81 MG tablet Take 1 tablet (81 mg total) by mouth daily. 10/16/14  Yes Janith Lima, MD  atorvastatin (LIPITOR) 20 MG tablet TAKE 1 TABLET ONCE DAILY. 01/27/15  Yes Janith Lima, MD  calcium carbonate (TUMS - DOSED IN MG ELEMENTAL CALCIUM) 500 MG chewable tablet Chew 1 tablet by mouth daily.   Yes Historical Provider, MD  dicyclomine (BENTYL) 10 MG capsule Take 1 capsule (10 mg total) by mouth 3 (three) times daily before meals. 1/2 hour before meals 05/20/15  Yes Amy S Esterwood, PA-C  glycopyrrolate (ROBINUL) 2 MG tablet Take 1 tablet (2 mg total) by mouth daily. 05/24/15  Yes Amy S Esterwood, PA-C  mesalamine (LIALDA) 1.2 G EC tablet Take 1 tablet (1.2 g total) by mouth daily with breakfast. 05/24/15  Yes Amy S Esterwood, PA-C  naproxen sodium (ANAPROX) 220 MG tablet Take 220 mg by mouth 2 (two) times daily with a meal.   Yes Historical Provider, MD  nebivolol (BYSTOLIC) 5 MG tablet Take 1 tablet (5 mg total) by  mouth daily. 05/12/15  Yes Janith Lima, MD  polyvinyl alcohol (LIQUIFILM TEARS) 1.4 % ophthalmic solution Place 1 drop into both eyes daily as needed for dry eyes.   Yes Historical Provider, MD  ramipril (ALTACE) 10 MG capsule TAKE (1) CAPSULE DAILY. 04/30/15  Yes Janith Lima, MD  VITAMIN D, CHOLECALCIFEROL, PO Take 1 tablet by mouth daily.   Yes Historical Provider, MD   BP 140/88 mmHg  Pulse 92  Temp(Src) 98.7 F (37.1 C) (Oral)  Resp 18  Ht 5\' 6"  (1.676 m)  Wt 149 lb (67.586 kg)  BMI 24.06 kg/m2  SpO2 99%  Vitals reviewed Physical Exam  Physical Examination: General appearance - alert, well appearing, and in no distress Mental status - alert, oriented to person, place, and time Eyes - no conjunctival injection, no scleral icterus Chest - clear to  auscultation, no wheezes, rales or rhonchi, symmetric air entry Heart - normal rate, regular rhythm, normal S1, S2, no murmurs, rubs, clicks or gallops Neurological - alert, oriented, normal speech,strength 5/5 in extremities x 4, sensation intact Musculoskeletal - no joint tenderness, deformity or swelling Extremities - peripheral pulses normal, no pedal edema, no clubbing or cyanosis- large area of hematoma/bruising covering left upper extremity, compartments are soft, no swelling distally to forearm or hand Skin - normal coloration and turgor, no rashes  ED Course  Procedures (including critical care time) Labs Review Labs Reviewed  CBC - Abnormal; Notable for the following:    WBC 11.7 (*)    RBC 3.60 (*)    Hemoglobin 10.1 (*)    HCT 31.4 (*)    All other components within normal limits  BASIC METABOLIC PANEL - Abnormal; Notable for the following:    Glucose, Bld 139 (*)    GFR calc non Af Amer 58 (*)    All other components within normal limits    Imaging Review No results found. I have personally reviewed and evaluated these images and lab results as part of my medical decision-making.   EKG Interpretation None      MDM   Final diagnoses:  Bruising    Pt presenting with c/o bruising and swelling of left upper arm.  Labs reassuring- normal platelets.  Pt scheduled for DVT ultrasound tomorrow morning.  I have elected not to give lovenox at this time as this may make her bleeding worse and I feel it is reasonable for her to wait for the test tomorrow morning and be treated if it is positive.  Discharged with strict return precautions.  Pt agreeable with plan.    Alfonzo Beers, MD 06/04/15 1239

## 2015-06-01 NOTE — Patient Instructions (Signed)
The pain in your upper arm over the past week or so may have been due to a muscle pull from walking the dog, but the acute swelling and bruising of your upper arm tonight may be a secondary condition form one of the blood vessels of the arm.   Go to [facility] emergency room tonight for further evaluation given how quick the swelling and bruising came on today. They can decide if other testing needed tonight or if ultrasound in the morning is ok.   Follow up with [place] if needed for further workup.   Return to the clinic or go to the nearest emergency room if any of your symptoms worsen or new symptoms occur.

## 2015-06-01 NOTE — Discharge Instructions (Signed)
Return to the ED with any concerns including increased swelling, chest pain, difficulty breathing, fever/chills, decreased level of alertness/lethargy, or any other alarming symptoms  You should return to have a vascular ultrasound of your left upper arm tomorrow morning as instructed

## 2015-06-02 ENCOUNTER — Ambulatory Visit (HOSPITAL_COMMUNITY)
Admission: RE | Admit: 2015-06-02 | Discharge: 2015-06-02 | Disposition: A | Discharge status: Home / Self Care | Payer: Medicare Other | Source: Ambulatory Visit | Attending: Emergency Medicine | Admitting: Emergency Medicine

## 2015-06-02 DIAGNOSIS — M79609 Pain in unspecified limb: Secondary | ICD-10-CM | POA: Diagnosis not present

## 2015-06-02 DIAGNOSIS — M79602 Pain in left arm: Secondary | ICD-10-CM | POA: Insufficient documentation

## 2015-06-02 DIAGNOSIS — M7981 Nontraumatic hematoma of soft tissue: Secondary | ICD-10-CM | POA: Diagnosis not present

## 2015-06-02 NOTE — Progress Notes (Signed)
*  PRELIMINARY RESULTS* Vascular Ultrasound Left upper extremity venous duplex has been completed.  Preliminary findings: No evidence of DVT or superficial thrombosis. There is a hematoma noted in the left upper arm measuring 4cm.   Patient was referred by Dr. Canary Brim from [facility] ED. Dr. Canary Brim is unavailable this AM. Spoke with Dr. Colvin Caroli about results.    Landry Mellow, RDMS, RVT  06/02/2015, 9:02 AM

## 2015-06-02 NOTE — ED Provider Notes (Signed)
09:05 call taken for vascular lab resulting upper extremity Doppler study. Report is for negative DVT and hematoma in upper arm. Verbal Instructions given for ice elevation and follow up with PCP.  Charlesetta Shanks, MD 06/02/15 [phone]

## 2015-06-09 ENCOUNTER — Other Ambulatory Visit (INDEPENDENT_AMBULATORY_CARE_PROVIDER_SITE_OTHER): Payer: Medicare Other

## 2015-06-09 ENCOUNTER — Encounter: Payer: Self-pay | Admitting: Internal Medicine

## 2015-06-09 ENCOUNTER — Ambulatory Visit (INDEPENDENT_AMBULATORY_CARE_PROVIDER_SITE_OTHER): Payer: Medicare Other | Admitting: Internal Medicine

## 2015-06-09 VITALS — BP 140/70 | HR 77 | Temp 97.6°F | Resp 16 | Ht 66.0 in | Wt 150.0 lb

## 2015-06-09 DIAGNOSIS — S40022D Contusion of left upper arm, subsequent encounter: Secondary | ICD-10-CM

## 2015-06-09 DIAGNOSIS — S40022S Contusion of left upper arm, sequela: Secondary | ICD-10-CM

## 2015-06-09 DIAGNOSIS — R238 Other skin changes: Secondary | ICD-10-CM

## 2015-06-09 DIAGNOSIS — K625 Hemorrhage of anus and rectum: Secondary | ICD-10-CM

## 2015-06-09 DIAGNOSIS — Z23 Encounter for immunization: Secondary | ICD-10-CM

## 2015-06-09 DIAGNOSIS — R233 Spontaneous ecchymoses: Secondary | ICD-10-CM

## 2015-06-09 DIAGNOSIS — D72829 Elevated white blood cell count, unspecified: Secondary | ICD-10-CM

## 2015-06-09 DIAGNOSIS — D510 Vitamin B12 deficiency anemia due to intrinsic factor deficiency: Secondary | ICD-10-CM

## 2015-06-09 DIAGNOSIS — S40029A Contusion of unspecified upper arm, initial encounter: Secondary | ICD-10-CM | POA: Insufficient documentation

## 2015-06-09 LAB — CBC WITH DIFFERENTIAL/PLATELET
BASOS ABS: 0 10*3/uL (ref 0.0–0.1)
Basophils Relative: 0.4 % (ref 0.0–3.0)
EOS ABS: 0.1 10*3/uL (ref 0.0–0.7)
Eosinophils Relative: 1.4 % (ref 0.0–5.0)
HCT: 32.3 % — ABNORMAL LOW (ref 36.0–46.0)
Hemoglobin: 10.3 g/dL — ABNORMAL LOW (ref 12.0–15.0)
LYMPHS ABS: 1.6 10*3/uL (ref 0.7–4.0)
Lymphocytes Relative: 21.9 % (ref 12.0–46.0)
MCHC: 31.9 g/dL (ref 30.0–36.0)
MCV: 88.1 fl (ref 78.0–100.0)
MONO ABS: 0.5 10*3/uL (ref 0.1–1.0)
MONOS PCT: 7.5 % (ref 3.0–12.0)
NEUTROS ABS: 4.9 10*3/uL (ref 1.4–7.7)
NEUTROS PCT: 68.8 % (ref 43.0–77.0)
PLATELETS: 287 10*3/uL (ref 150.0–400.0)
RBC: 3.66 Mil/uL — AB (ref 3.87–5.11)
RDW: 15.2 % (ref 11.5–15.5)
WBC: 7.2 10*3/uL (ref 4.0–10.5)

## 2015-06-09 LAB — APTT: aPTT: 29.7 s (ref 23.4–32.7)

## 2015-06-09 LAB — VITAMIN B12: Vitamin B-12: 268 pg/mL (ref 211–911)

## 2015-06-09 LAB — IBC PANEL
IRON: 55 ug/dL (ref 42–145)
SATURATION RATIOS: 10.8 % — AB (ref 20.0–50.0)
TRANSFERRIN: 365 mg/dL — AB (ref 212.0–360.0)

## 2015-06-09 LAB — PROTIME-INR
INR: 1 ratio (ref 0.8–1.0)
PROTHROMBIN TIME: 11.4 s (ref 9.6–13.1)

## 2015-06-09 LAB — FERRITIN: FERRITIN: 25.6 ng/mL (ref 10.0–291.0)

## 2015-06-09 LAB — FOLATE: FOLATE: 8.5 ng/mL (ref >5.9)

## 2015-06-09 NOTE — Progress Notes (Signed)
Pre visit review using our clinic review tool, if applicable. No additional management support is needed unless otherwise documented below in the visit note. 

## 2015-06-09 NOTE — Progress Notes (Signed)
Subjective:  Patient ID: Emily Sweeney, female    DOB: 07-19-1941  Age: 74 y.o. MRN: [id number]  CC: Arm Pain   HPI Emily Sweeney presents for follow-up on a complication she had with her left upper extremity. She describes an episode about 10 days ago where she developed spontaneous bruising and swelling in her left upper arm. She did not recall a trauma or injury but says the symptoms started after she opened a door. She also had a CT scan done 3 days prior and an IV was started in her left antecubital fossa. She cannot directly relate any of these to the development of pain and swelling in her left arm but there was a temporal component. She was seen at an urgent care center and eventually in the emergency room. She was found to be anemic and to have a slightly elevated white blood cell count. She had an ultrasound of left upper extremity which showed that there was a hematoma over her humerus but there was no deep venous thromboses. She states the bruising and swelling is resolving and it is spreading into her left forearm. She says the entire arm feels sensitive but there is no pain or claudication. She has not noticed any bruising or bleeding  anywhere else. Her risk factors for spontaneous hematoma are the intake of aspirin and an anti-inflammatory.  Outpatient Prescriptions Prior to Visit  Medication Sig Dispense Refill  . aspirin EC 81 MG tablet Take 1 tablet (81 mg total) by mouth daily. 90 tablet 1  . atorvastatin (LIPITOR) 20 MG tablet TAKE 1 TABLET ONCE DAILY. 90 tablet 3  . calcium carbonate (TUMS - DOSED IN MG ELEMENTAL CALCIUM) 500 MG chewable tablet Chew 1 tablet by mouth daily.    Marland Kitchen glycopyrrolate (ROBINUL) 2 MG tablet Take 1 tablet (2 mg total) by mouth daily. 30 tablet 3  . mesalamine (LIALDA) 1.2 G EC tablet Take 1 tablet (1.2 g total) by mouth daily with breakfast. 72 tablet 0  . naproxen sodium (ANAPROX) 220 MG tablet Take 220 mg by mouth 2 (two) times daily with a meal.     . nebivolol (BYSTOLIC) 5 MG tablet Take 1 tablet (5 mg total) by mouth daily. 30 tablet 11  . polyvinyl alcohol (LIQUIFILM TEARS) 1.4 % ophthalmic solution Place 1 drop into both eyes daily as needed for dry eyes.    . ramipril (ALTACE) 10 MG capsule TAKE (1) CAPSULE DAILY. 90 capsule 1  . VITAMIN D, CHOLECALCIFEROL, PO Take 1 tablet by mouth daily.    Marland Kitchen dicyclomine (BENTYL) 10 MG capsule Take 1 capsule (10 mg total) by mouth 3 (three) times daily before meals. 1/2 hour before meals (Patient not taking: Reported on 06/09/2015) 50 capsule 1   No facility-administered medications prior to visit.    ROS Review of Systems  Constitutional: Negative.  Negative for fever, chills, diaphoresis, appetite change and fatigue.  HENT: Negative.  Negative for nosebleeds, sore throat and voice change.   Eyes: Negative.  Negative for visual disturbance.  Respiratory: Negative.  Negative for cough, choking, chest tightness, shortness of breath and stridor.   Cardiovascular: Negative.  Negative for chest pain, palpitations and leg swelling.  Gastrointestinal: Negative.  Negative for nausea, vomiting, abdominal pain, diarrhea, constipation and blood in stool.       She previously had a history of bleeding in her stool. She saw GI and had a CT scan . She states for the last week or 2 there have been no  more episodes of blood in her stool.  Endocrine: Negative.   Genitourinary: Negative.  Negative for dysuria and hematuria.  Musculoskeletal: Negative.  Negative for myalgias, back pain, joint swelling and arthralgias.  Skin: Positive for color change. Negative for pallor, rash and wound.  Allergic/Immunologic: Negative.   Neurological: Negative.  Negative for dizziness, tremors, syncope, weakness, light-headedness and headaches.  Hematological: Negative for adenopathy. Bruises/bleeds easily.  Psychiatric/Behavioral: Negative.     Objective:  BP 140/70 mmHg  Pulse 77  Temp(Src) 97.6 F (36.4 C) (Oral)   Resp 16  Ht 5\' 6"  (1.676 m)  Wt 150 lb (68.04 kg)  BMI 24.22 kg/m2  SpO2 95%  BP Readings from Last 3 Encounters:  06/09/15 140/70  06/01/15 140/88  06/01/15 150/80    Wt Readings from Last 3 Encounters:  06/09/15 150 lb (68.04 kg)  06/01/15 149 lb (67.586 kg)  06/01/15 149 lb (67.586 kg)    Physical Exam  Constitutional: She is oriented to person, place, and time. She appears well-developed and well-nourished. No distress.  HENT:  Head: Normocephalic and atraumatic.  Nose: Nose normal.  Mouth/Throat: Oropharynx is clear and moist.  Eyes: Conjunctivae are normal. Right eye exhibits no discharge. Left eye exhibits no discharge. No scleral icterus.  Neck: Normal range of motion. Neck supple. No JVD present. No tracheal deviation present. No thyromegaly present.  Cardiovascular: Normal rate, regular rhythm, normal heart sounds and intact distal pulses.  Exam reveals no gallop and no friction rub.   No murmur heard. Pulses:      Carotid pulses are 1+ on the right side, and 1+ on the left side.      Radial pulses are 1+ on the right side, and 1+ on the left side.       Femoral pulses are 1+ on the right side, and 1+ on the left side.      Popliteal pulses are 1+ on the right side, and 1+ on the left side.       Dorsalis pedis pulses are 1+ on the right side, and 1+ on the left side.       Posterior tibial pulses are 1+ on the right side, and 1+ on the left side.  Pulmonary/Chest: Effort normal and breath sounds normal. No stridor. No respiratory distress. She has no wheezes. She has no rales. She exhibits no tenderness.  Abdominal: Soft. Bowel sounds are normal. She exhibits no distension and no mass. There is no tenderness. There is no rebound and no guarding.  Musculoskeletal: Normal range of motion. She exhibits no edema or tenderness.       Left elbow: She exhibits normal range of motion, no swelling, no effusion, no deformity and no laceration. No tenderness found. No radial  head tenderness noted.       Left upper arm: She exhibits deformity. She exhibits no tenderness, no bony tenderness, no swelling, no edema and no laceration.       Arms: Lymphadenopathy:       Head (right side): No submental, no submandibular, no tonsillar, no preauricular, no posterior auricular and no occipital adenopathy present.       Head (left side): No submental, no submandibular, no tonsillar, no preauricular, no posterior auricular and no occipital adenopathy present.    She has no cervical adenopathy.    She has no axillary adenopathy.       Right axillary: No pectoral and no lateral adenopathy present.       Left axillary: No pectoral and  no lateral adenopathy present.      Right: No inguinal and no supraclavicular adenopathy present.       Left: No inguinal and no supraclavicular adenopathy present.  Neurological: She is oriented to person, place, and time.  Skin: Skin is warm and dry. No rash noted. She is not diaphoretic. No erythema. No pallor.    Lab Results  Component Value Date   WBC 7.2 06/09/2015   HGB 10.3* 06/09/2015   HCT 32.3* 06/09/2015   PLT 287.0 06/09/2015   GLUCOSE 139* 06/01/2015   CHOL 185 01/12/2015   TRIG 82.0 01/12/2015   HDL 80.70 01/12/2015   LDLCALC 88 01/12/2015   ALT 10 10/16/2014   AST 17 10/16/2014   NA 135 06/01/2015   K 4.1 06/01/2015   CL 103 06/01/2015   CREATININE 0.93 06/01/2015   BUN 19 06/01/2015   CO2 25 06/01/2015   TSH 1.42 01/12/2015   INR 1.0 06/09/2015   HGBA1C 5.7 01/12/2015    No results found.  Assessment & Plan:   Emily Sweeney was seen today for arm pain.  Diagnoses and all orders for this visit:  Pernicious anemia- I will repeat her CBC and check her vitamin levels to see if there is vitamin deficiency. Will also screen her with a serum protein electrophoresis to see if she has lymphoproliferative disease. -     IBC panel; Future -     CBC with Differential/Platelet; Future -     Protime-INR; Future -      Ferritin; Future -     Folate; Future -     Vitamin B12; Future -     Methylmalonic acid, serum; Future -     Protein electrophoresis, serum; Future  Need for influenza vaccination -     Flu Vaccine QUAD 36+ mos IM -     Protime-INR; Future  Traumatic ecchymosis of upper arm, left, sequela -     Protime-INR; Future  Leukocytosis- I have ordered flow cytometry and a serum protein electrophoresis to see if she has lymphoproliferative disease. -     Protime-INR; Future -     Immunophenotyping by Flow Cytometry; Future -     Protein electrophoresis, serum; Future  Easy bruising- this is most likely related to her intake of aspirin and an anti-inflammatory. I will check her labs to screen for coagulopathy. -     CBC with Differential/Platelet; Future -     Protime-INR; Future  Hematoma of arm, left, subsequent encounter- the hematoma appears to have resolved. I will check her labs to screen for coagulopathy. -     Protime-INR; Future  Rectal bleeding- will evaluate her for coagulopathy with lab work. -     APTT; Future   I have discontinued Emily Sweeney's dicyclomine. I am also having her maintain her aspirin EC, atorvastatin, naproxen sodium, calcium carbonate, ramipril, nebivolol, (VITAMIN D, CHOLECALCIFEROL, PO), glycopyrrolate, mesalamine, and polyvinyl alcohol.  No orders of the defined types were placed in this encounter.     Follow-up: Return in about 3 weeks (around 06/30/2015).  Emily Calico, MD

## 2015-06-09 NOTE — Patient Instructions (Signed)
Hematoma  A hematoma is a collection of blood under the skin, in an organ, in a body space, in a joint space, or in other tissue. The blood can clot to form a lump that you can see and feel. The lump is often firm and may sometimes become sore and tender. Most hematomas get better in a few days to weeks. However, some hematomas may be serious and require medical care. Hematomas can range in size from very small to very large.  CAUSES   A hematoma can be caused by a blunt or penetrating injury. It can also be caused by spontaneous leakage from a blood vessel under the skin. Spontaneous leakage from a blood vessel is more likely to occur in older people, especially those taking blood thinners. Sometimes, a hematoma can develop after certain medical procedures.  SIGNS AND SYMPTOMS   · A firm lump on the body.  · Possible pain and tenderness in the area.  · Bruising. Blue, dark blue, purple-red, or yellowish skin may appear at the site of the hematoma if the hematoma is close to the surface of the skin.  For hematomas in deeper tissues or body spaces, the signs and symptoms may be subtle. For example, an intra-abdominal hematoma may cause abdominal pain, weakness, fainting, and shortness of breath. An intracranial hematoma may cause a headache or symptoms such as weakness, trouble speaking, or a change in consciousness.  DIAGNOSIS   A hematoma can usually be diagnosed based on your medical history and a physical exam. Imaging tests may be needed if your health care provider suspects a hematoma in deeper tissues or body spaces, such as the abdomen, head, or chest. These tests may include ultrasonography or a CT scan.   TREATMENT   Hematomas usually go away on their own over time. Rarely does the blood need to be drained out of the body. Large hematomas or those that may affect vital organs will sometimes need surgical drainage or monitoring.  HOME CARE INSTRUCTIONS   · Apply ice to the injured area:      Put ice in a  plastic bag.      Place a towel between your skin and the bag.      Leave the ice on for 20 minutes, 2-3 times a day for the first 1 to 2 days.    · After the first 2 days, switch to using warm compresses on the hematoma.    · Elevate the injured area to help decrease pain and swelling. Wrapping the area with an elastic bandage may also be helpful. Compression helps to reduce swelling and promotes shrinking of the hematoma. Make sure the bandage is not wrapped too tight.    · If your hematoma is on a lower extremity and is painful, crutches may be helpful for a couple days.    · Only take over-the-counter or prescription medicines as directed by your health care provider.  SEEK IMMEDIATE MEDICAL CARE IF:   · You have increasing pain, or your pain is not controlled with medicine.    · You have a fever.    · You have worsening swelling or discoloration.    · Your skin over the hematoma breaks or starts bleeding.    · Your hematoma is in your chest or abdomen and you have weakness, shortness of breath, or a change in consciousness.  · Your hematoma is on your scalp (caused by a fall or injury) and you have a worsening headache or a change in alertness or consciousness.  MAKE SURE YOU:   ·   Understand these instructions.  · Will watch your condition.  · Will get help right away if you are not doing well or get worse.     This information is not intended to replace advice given to you by your health care provider. Make sure you discuss any questions you have with your health care provider.     Document Released: 03/14/2004 Document Revised: 04/02/2013 Document Reviewed: 01/08/2013  Elsevier Interactive Patient Education ©2016 [employer]

## 2015-06-10 LAB — IMMUNOPHENOTYPING BY FLOW CYTOMETRY

## 2015-06-10 LAB — REFLEX BLAST SCREEN

## 2015-06-11 LAB — PROTEIN ELECTROPHORESIS, SERUM
ALBUMIN ELP: 4.2 g/dL (ref 3.8–4.8)
ALPHA-1-GLOBULIN: 0.3 g/dL (ref 0.2–0.3)
Alpha-2-Globulin: 0.7 g/dL (ref 0.5–0.9)
BETA 2: 0.4 g/dL (ref 0.2–0.5)
BETA GLOBULIN: 0.6 g/dL (ref 0.4–0.6)
GAMMA GLOBULIN: 0.7 g/dL — AB (ref 0.8–1.7)
TOTAL PROTEIN, SERUM ELECTROPHOR: 6.8 g/dL (ref 6.1–8.1)

## 2015-06-12 LAB — METHYLMALONIC ACID, SERUM: Methylmalonic Acid, Quant: 178 nmol/L (ref 87–318)

## 2015-06-13 ENCOUNTER — Encounter: Payer: Self-pay | Admitting: Internal Medicine

## 2015-06-30 ENCOUNTER — Encounter: Payer: Self-pay | Admitting: Internal Medicine

## 2015-06-30 ENCOUNTER — Ambulatory Visit (INDEPENDENT_AMBULATORY_CARE_PROVIDER_SITE_OTHER): Payer: Medicare Other | Admitting: Internal Medicine

## 2015-06-30 VITALS — BP 118/70 | HR 70 | Temp 98.4°F | Resp 16 | Ht 66.0 in | Wt 149.0 lb

## 2015-06-30 DIAGNOSIS — I10 Essential (primary) hypertension: Secondary | ICD-10-CM

## 2015-06-30 DIAGNOSIS — S40022D Contusion of left upper arm, subsequent encounter: Secondary | ICD-10-CM

## 2015-06-30 NOTE — Progress Notes (Signed)
Pre visit review using our clinic review tool, if applicable. No additional management support is needed unless otherwise documented below in the visit note. 

## 2015-06-30 NOTE — Patient Instructions (Signed)
Hypertension Hypertension, commonly called high blood pressure, is when the force of blood pumping through your arteries is too strong. Your arteries are the blood vessels that carry blood from your heart throughout your body. A blood pressure reading consists of a higher number over a lower number, such as 110/72. The higher number (systolic) is the pressure inside your arteries when your heart pumps. The lower number (diastolic) is the pressure inside your arteries when your heart relaxes. Ideally you want your blood pressure below 120/80. Hypertension forces your heart to work harder to pump blood. Your arteries may become narrow or stiff. Having untreated or uncontrolled hypertension can cause heart attack, stroke, kidney disease, and other problems. RISK FACTORS Some risk factors for high blood pressure are controllable. Others are not.  Risk factors you cannot control include:   Race. You may be at higher risk if you are African American.  Age. Risk increases with age.  Gender. Men are at higher risk than women before age 45 years. After age 65, women are at higher risk than men. Risk factors you can control include:  Not getting enough exercise or physical activity.  Being overweight.  Getting too much fat, sugar, calories, or salt in your diet.  Drinking too much alcohol. SIGNS AND SYMPTOMS Hypertension does not usually cause signs or symptoms. Extremely high blood pressure (hypertensive crisis) may cause headache, anxiety, shortness of breath, and nosebleed. DIAGNOSIS To check if you have hypertension, your health care provider will measure your blood pressure while you are seated, with your arm held at the level of your heart. It should be measured at least twice using the same arm. Certain conditions can cause a difference in blood pressure between your right and left arms. A blood pressure reading that is higher than normal on one occasion does not mean that you need treatment. If  it is not clear whether you have high blood pressure, you may be asked to return on a different day to have your blood pressure checked again. Or, you may be asked to monitor your blood pressure at home for 1 or more weeks. TREATMENT Treating high blood pressure includes making lifestyle changes and possibly taking medicine. Living a healthy lifestyle can help lower high blood pressure. You may need to change some of your habits. Lifestyle changes may include:  Following the DASH diet. This diet is high in fruits, vegetables, and whole grains. It is low in salt, red meat, and added sugars.  Keep your sodium intake below 2,300 mg per day.  Getting at least 30-45 minutes of aerobic exercise at least 4 times per week.  Losing weight if necessary.  Not smoking.  Limiting alcoholic beverages.  Learning ways to reduce stress. Your health care provider may prescribe medicine if lifestyle changes are not enough to get your blood pressure under control, and if one of the following is true:  You are 18-59 years of age and your systolic blood pressure is above 140.  You are 60 years of age or older, and your systolic blood pressure is above 150.  Your diastolic blood pressure is above 90.  You have diabetes, and your systolic blood pressure is over 140 or your diastolic blood pressure is over 90.  You have kidney disease and your blood pressure is above 140/90.  You have heart disease and your blood pressure is above 140/90. Your personal target blood pressure may vary depending on your medical conditions, your age, and other factors. HOME CARE INSTRUCTIONS    Have your blood pressure rechecked as directed by your health care provider.   Take medicines only as directed by your health care provider. Follow the directions carefully. Blood pressure medicines must be taken as prescribed. The medicine does not work as well when you skip doses. Skipping doses also puts you at risk for  problems.  Do not smoke.   Monitor your blood pressure at home as directed by your health care provider. SEEK MEDICAL CARE IF:   You think you are having a reaction to medicines taken.  You have recurrent headaches or feel dizzy.  You have swelling in your ankles.  You have trouble with your vision. SEEK IMMEDIATE MEDICAL CARE IF:  You develop a severe headache or confusion.  You have unusual weakness, numbness, or feel faint.  You have severe chest or abdominal pain.  You vomit repeatedly.  You have trouble breathing. MAKE SURE YOU:   Understand these instructions.  Will watch your condition.  Will get help right away if you are not doing well or get worse.   This information is not intended to replace advice given to you by your health care provider. Make sure you discuss any questions you have with your health care provider.   Document Released: 07/31/2005 Document Revised: 12/15/2014 Document Reviewed: 05/23/2013 Elsevier Interactive Patient Education 2016 [employer]  

## 2015-07-01 NOTE — Progress Notes (Signed)
Subjective:  Patient ID: Emily Sweeney, female    DOB: 1941/06/07  Age: 74 y.o. MRN: [id number]  CC: Follow-up   HPI Cheryllynn Tivnan presents for a spontaneous ecchymosis/hematoma that developed in her left upper extremity. She is happy to report that all the symptoms have resolved. She feels well today and offers no complaints. An evaluation for coagulopathy was unremarkable.  Outpatient Prescriptions Prior to Visit  Medication Sig Dispense Refill  . aspirin EC 81 MG tablet Take 1 tablet (81 mg total) by mouth daily. 90 tablet 1  . atorvastatin (LIPITOR) 20 MG tablet TAKE 1 TABLET ONCE DAILY. 90 tablet 3  . calcium carbonate (TUMS - DOSED IN MG ELEMENTAL CALCIUM) 500 MG chewable tablet Chew 1 tablet by mouth daily.    Marland Kitchen glycopyrrolate (ROBINUL) 2 MG tablet Take 1 tablet (2 mg total) by mouth daily. 30 tablet 3  . mesalamine (LIALDA) 1.2 G EC tablet Take 1 tablet (1.2 g total) by mouth daily with breakfast. 72 tablet 0  . naproxen sodium (ANAPROX) 220 MG tablet Take 220 mg by mouth 2 (two) times daily with a meal.    . nebivolol (BYSTOLIC) 5 MG tablet Take 1 tablet (5 mg total) by mouth daily. 30 tablet 11  . polyvinyl alcohol (LIQUIFILM TEARS) 1.4 % ophthalmic solution Place 1 drop into both eyes daily as needed for dry eyes.    . ramipril (ALTACE) 10 MG capsule TAKE (1) CAPSULE DAILY. 90 capsule 1  . VITAMIN D, CHOLECALCIFEROL, PO Take 1 tablet by mouth daily.     No facility-administered medications prior to visit.    ROS Review of Systems  Constitutional: Negative.  Negative for fever, chills, diaphoresis, appetite change and fatigue.  HENT: Negative.   Eyes: Negative.   Respiratory: Negative.  Negative for cough, choking, chest tightness, shortness of breath and stridor.   Cardiovascular: Negative.  Negative for chest pain, palpitations and leg swelling.  Gastrointestinal: Negative.  Negative for nausea, abdominal pain and blood in stool.  Endocrine: Negative.     Genitourinary: Negative.   Musculoskeletal: Negative.  Negative for myalgias, back pain and neck pain.  Skin: Negative.  Negative for color change and rash.  Allergic/Immunologic: Negative.   Neurological: Negative.  Negative for dizziness.  Hematological: Negative.  Negative for adenopathy. Does not bruise/bleed easily.  Psychiatric/Behavioral: Negative.     Objective:  BP 118/70 mmHg  Pulse 70  Temp(Src) 98.4 F (36.9 C) (Oral)  Ht 5\' 6"  (1.676 m)  Wt 149 lb (67.586 kg)  BMI 24.06 kg/m2  SpO2 97%  BP Readings from Last 3 Encounters:  06/30/15 118/70  06/09/15 140/70  06/01/15 140/88    Wt Readings from Last 3 Encounters:  06/30/15 149 lb (67.586 kg)  06/09/15 150 lb (68.04 kg)  06/01/15 149 lb (67.586 kg)    Physical Exam  Constitutional: She is oriented to person, place, and time. No distress.  HENT:  Head: Normocephalic and atraumatic.  Mouth/Throat: Oropharynx is clear and moist. No oropharyngeal exudate.  Eyes: Conjunctivae are normal. Right eye exhibits no discharge. Left eye exhibits no discharge. No scleral icterus.  Neck: Normal range of motion. Neck supple. No JVD present. No tracheal deviation present. No thyromegaly present.  Cardiovascular: Normal rate, regular rhythm, normal heart sounds and intact distal pulses.  Exam reveals no gallop and no friction rub.   No murmur heard. Pulmonary/Chest: Effort normal and breath sounds normal. No stridor. No respiratory distress. She has no wheezes. She has no rales. She exhibits  no tenderness.  Abdominal: Soft. Bowel sounds are normal. She exhibits no distension and no mass. There is no tenderness. There is no rebound and no guarding.  Musculoskeletal: Normal range of motion. She exhibits no edema or tenderness.       Left upper arm: Normal. She exhibits no tenderness, no bony tenderness, no swelling, no edema, no deformity and no laceration.  Lymphadenopathy:    She has no cervical adenopathy.  Neurological: She  is oriented to person, place, and time.  Skin: Skin is warm and dry. No rash noted. She is not diaphoretic. No erythema. No pallor.  Vitals reviewed.   Lab Results  Component Value Date   WBC 7.2 06/09/2015   HGB 10.3* 06/09/2015   HCT 32.3* 06/09/2015   PLT 287.0 06/09/2015   GLUCOSE 139* 06/01/2015   CHOL 185 01/12/2015   TRIG 82.0 01/12/2015   HDL 80.70 01/12/2015   LDLCALC 88 01/12/2015   ALT 10 10/16/2014   AST 17 10/16/2014   NA 135 06/01/2015   K 4.1 06/01/2015   CL 103 06/01/2015   CREATININE 0.93 06/01/2015   BUN 19 06/01/2015   CO2 25 06/01/2015   TSH 1.42 01/12/2015   INR 1.0 06/09/2015   HGBA1C 5.7 01/12/2015    No results found.  Assessment & Plan:   There are no diagnoses linked to this encounter. I am having Ms. Teahan maintain her aspirin EC, atorvastatin, naproxen sodium, calcium carbonate, ramipril, nebivolol, (VITAMIN D, CHOLECALCIFEROL, PO), glycopyrrolate, mesalamine, and polyvinyl alcohol.  No orders of the defined types were placed in this encounter.     Follow-up: Return in about 6 months (around 12/28/2015).  Scarlette Calico, MD

## 2015-07-01 NOTE — Assessment & Plan Note (Signed)
This has resolved No coagulopathy was found

## 2015-07-01 NOTE — Assessment & Plan Note (Signed)
Her BP is well controlled 

## 2015-08-20 ENCOUNTER — Telehealth: Payer: Self-pay | Admitting: Physician Assistant

## 2015-08-20 MED ORDER — MESALAMINE 1.2 G PO TBEC: 1.2000 g | DELAYED_RELEASE_TABLET | Freq: Every day | ORAL | Status: DC

## 2015-08-20 NOTE — Telephone Encounter (Signed)
Sent prescription with refills to [facility] for [facility], per [employer].

## 2015-10-14 ENCOUNTER — Encounter: Payer: Self-pay | Admitting: Internal Medicine

## 2015-10-14 ENCOUNTER — Ambulatory Visit (INDEPENDENT_AMBULATORY_CARE_PROVIDER_SITE_OTHER): Payer: Medicare Other | Admitting: Internal Medicine

## 2015-10-14 ENCOUNTER — Other Ambulatory Visit (INDEPENDENT_AMBULATORY_CARE_PROVIDER_SITE_OTHER): Payer: Medicare Other

## 2015-10-14 VITALS — BP 142/82 | HR 66 | Temp 97.8°F | Resp 16 | Ht 66.0 in | Wt 146.0 lb

## 2015-10-14 DIAGNOSIS — Z0000 Encounter for general adult medical examination without abnormal findings: Secondary | ICD-10-CM | POA: Diagnosis not present

## 2015-10-14 DIAGNOSIS — D510 Vitamin B12 deficiency anemia due to intrinsic factor deficiency: Secondary | ICD-10-CM

## 2015-10-14 DIAGNOSIS — R739 Hyperglycemia, unspecified: Secondary | ICD-10-CM

## 2015-10-14 DIAGNOSIS — E7800 Pure hypercholesterolemia, unspecified: Secondary | ICD-10-CM

## 2015-10-14 DIAGNOSIS — Z23 Encounter for immunization: Secondary | ICD-10-CM | POA: Diagnosis not present

## 2015-10-14 DIAGNOSIS — I10 Essential (primary) hypertension: Secondary | ICD-10-CM

## 2015-10-14 DIAGNOSIS — E538 Deficiency of other specified B group vitamins: Secondary | ICD-10-CM

## 2015-10-14 LAB — LIPID PANEL
Cholesterol: 186 mg/dL (ref 0–200)
HDL: 76.4 mg/dL (ref >39.00)
LDL Cholesterol: 92 mg/dL (ref 0–99)
NONHDL: 109.97
Total CHOL/HDL Ratio: 2
Triglycerides: 92 mg/dL (ref 0.0–149.0)
VLDL: 18.4 mg/dL (ref 0.0–40.0)

## 2015-10-14 LAB — CBC WITH DIFFERENTIAL/PLATELET
Basophils Absolute: 0 10*3/uL (ref 0.0–0.1)
Basophils Relative: 0.6 % (ref 0.0–3.0)
Eosinophils Absolute: 0 10*3/uL (ref 0.0–0.7)
Eosinophils Relative: 0 % (ref 0.0–5.0)
HCT: 28.5 % — ABNORMAL LOW (ref 36.0–46.0)
HEMOGLOBIN: 9 g/dL — AB (ref 12.0–15.0)
LYMPHS ABS: 1.6 10*3/uL (ref 0.7–4.0)
Lymphocytes Relative: 26.5 % (ref 12.0–46.0)
MCHC: 31.6 g/dL (ref 30.0–36.0)
MCV: 71.7 fl — ABNORMAL LOW (ref 78.0–100.0)
MONO ABS: 0.7 10*3/uL (ref 0.1–1.0)
Monocytes Relative: 11.1 % (ref 3.0–12.0)
NEUTROS ABS: 3.8 10*3/uL (ref 1.4–7.7)
Neutrophils Relative %: 61.8 % (ref 43.0–77.0)
Platelets: 258 10*3/uL (ref 150.0–400.0)
RBC: 3.97 Mil/uL (ref 3.87–5.11)
RDW: 16.8 % — AB (ref 11.5–15.5)
WBC: 6.2 10*3/uL (ref 4.0–10.5)

## 2015-10-14 LAB — BASIC METABOLIC PANEL
BUN: 17 mg/dL (ref 6–23)
CALCIUM: 10.3 mg/dL (ref 8.4–10.5)
CO2: 26 mEq/L (ref 19–32)
Chloride: 104 mEq/L (ref 96–112)
Creatinine, Ser: 0.86 mg/dL (ref 0.40–1.20)
GFR: 68.09 mL/min (ref >60.00)
GLUCOSE: 117 mg/dL — AB (ref 70–99)
Potassium: 4.7 mEq/L (ref 3.5–5.1)
SODIUM: 138 meq/L (ref 135–145)

## 2015-10-14 LAB — VITAMIN B12: VITAMIN B 12: 200 pg/mL — AB (ref 211–911)

## 2015-10-14 LAB — HEMOGLOBIN A1C: HEMOGLOBIN A1C: 6.4 % (ref 4.6–6.5)

## 2015-10-14 LAB — FECAL OCCULT BLOOD, GUAIAC: Fecal Occult Blood: NEGATIVE

## 2015-10-14 LAB — TSH: TSH: 1.77 u[IU]/mL (ref 0.35–4.50)

## 2015-10-14 MED ORDER — CYANOCOBALAMIN 1000 MCG/ML IJ SOLN
1000.0000 ug | Freq: Once | INTRAMUSCULAR | Status: AC
  Administered 2015-10-14: 1000 ug via INTRAMUSCULAR

## 2015-10-14 NOTE — Progress Notes (Signed)
Pre visit review using our clinic review tool, if applicable. No additional management support is needed unless otherwise documented below in the visit note. 

## 2015-10-14 NOTE — Patient Instructions (Signed)
Preventive Care for Adults, Female A healthy lifestyle and preventive care can promote health and wellness. Preventive health guidelines for women include the following key practices.  A routine yearly physical is a good way to check with your health care provider about your health and preventive screening. It is a chance to share any concerns and updates on your health and to receive a thorough exam.  Visit your dentist for a routine exam and preventive care every 6 months. Brush your teeth twice a day and floss once a day. Good oral hygiene prevents tooth decay and gum disease.  The frequency of eye exams is based on your age, health, family medical history, use of contact lenses, and other factors. Follow your health care provider's recommendations for frequency of eye exams.  Eat a healthy diet. Foods like vegetables, fruits, whole grains, low-fat dairy products, and lean protein foods contain the nutrients you need without too many calories. Decrease your intake of foods high in solid fats, added sugars, and salt. Eat the right amount of calories for you.Get information about a proper diet from your health care provider, if necessary.  Regular physical exercise is one of the most important things you can do for your health. Most adults should get at least 150 minutes of moderate-intensity exercise (any activity that increases your heart rate and causes you to sweat) each week. In addition, most adults need muscle-strengthening exercises on 2 or more days a week.  Maintain a healthy weight. The body mass index (BMI) is a screening tool to identify possible weight problems. It provides an estimate of body fat based on height and weight. Your health care provider can find your BMI and can help you achieve or maintain a healthy weight.For adults 20 years and older:  A BMI below 18.5 is considered underweight.  A BMI of 18.5 to 24.9 is normal.  A BMI of 25 to 29.9 is considered overweight.  A  BMI of 30 and above is considered obese.  Maintain normal blood lipids and cholesterol levels by exercising and minimizing your intake of saturated fat. Eat a balanced diet with plenty of fruit and vegetables. Blood tests for lipids and cholesterol should begin at age 45 and be repeated every 5 years. If your lipid or cholesterol levels are high, you are over 50, or you are at high risk for heart disease, you may need your cholesterol levels checked more frequently.Ongoing high lipid and cholesterol levels should be treated with medicines if diet and exercise are not working.  If you smoke, find out from your health care provider how to quit. If you do not use tobacco, do not start.  Lung cancer screening is recommended for adults aged 45-80 years who are at high risk for developing lung cancer because of a history of smoking. A yearly low-dose CT scan of the lungs is recommended for people who have at least a 30-pack-year history of smoking and are a current smoker or have quit within the past 15 years. A pack year of smoking is smoking an average of 1 pack of cigarettes a day for 1 year (for example: 1 pack a day for 30 years or 2 packs a day for 15 years). Yearly screening should continue until the smoker has stopped smoking for at least 15 years. Yearly screening should be stopped for people who develop a health problem that would prevent them from having lung cancer treatment.  If you are pregnant, do not drink alcohol. If you are  breastfeeding, be very cautious about drinking alcohol. If you are not pregnant and choose to drink alcohol, do not have more than 1 drink per day. One drink is considered to be 12 ounces (355 mL) of beer, 5 ounces (148 mL) of wine, or 1.5 ounces (44 mL) of liquor.  Avoid use of street drugs. Do not share needles with anyone. Ask for help if you need support or instructions about stopping the use of drugs.  High blood pressure causes heart disease and increases the risk  of stroke. Your blood pressure should be checked at least every 1 to 2 years. Ongoing high blood pressure should be treated with medicines if weight loss and exercise do not work.  If you are 55-79 years old, ask your health care provider if you should take aspirin to prevent strokes.  Diabetes screening is done by taking a blood sample to check your blood glucose level after you have not eaten for a certain period of time (fasting). If you are not overweight and you do not have risk factors for diabetes, you should be screened once every 3 years starting at age 45. If you are overweight or obese and you are 40-70 years of age, you should be screened for diabetes every year as part of your cardiovascular risk assessment.  Breast cancer screening is essential preventive care for women. You should practice "breast self-awareness." This means understanding the normal appearance and feel of your breasts and may include breast self-examination. Any changes detected, no matter how small, should be reported to a health care provider. Women in their 20s and 30s should have a clinical breast exam (CBE) by a health care provider as part of a regular health exam every 1 to 3 years. After age 40, women should have a CBE every year. Starting at age 40, women should consider having a mammogram (breast X-ray test) every year. Women who have a family history of breast cancer should talk to their health care provider about genetic screening. Women at a high risk of breast cancer should talk to their health care providers about having an MRI and a mammogram every year.  Breast cancer gene (BRCA)-related cancer risk assessment is recommended for women who have family members with BRCA-related cancers. BRCA-related cancers include breast, ovarian, tubal, and peritoneal cancers. Having family members with these cancers may be associated with an increased risk for harmful changes (mutations) in the breast cancer genes BRCA1 and  BRCA2. Results of the assessment will determine the need for genetic counseling and BRCA1 and BRCA2 testing.  Your health care provider may recommend that you be screened regularly for cancer of the pelvic organs (ovaries, uterus, and vagina). This screening involves a pelvic examination, including checking for microscopic changes to the surface of your cervix (Pap test). You may be encouraged to have this screening done every 3 years, beginning at age 21.  For women ages 30-65, health care providers may recommend pelvic exams and Pap testing every 3 years, or they may recommend the Pap and pelvic exam, combined with testing for human papilloma virus (HPV), every 5 years. Some types of HPV increase your risk of cervical cancer. Testing for HPV may also be done on women of any age with unclear Pap test results.  Other health care providers may not recommend any screening for nonpregnant women who are considered low risk for pelvic cancer and who do not have symptoms. Ask your health care provider if a screening pelvic exam is right for   you.  If you have had past treatment for cervical cancer or a condition that could lead to cancer, you need Pap tests and screening for cancer for at least 20 years after your treatment. If Pap tests have been discontinued, your risk factors (such as having a new sexual partner) need to be reassessed to determine if screening should resume. Some women have medical problems that increase the chance of getting cervical cancer. In these cases, your health care provider may recommend more frequent screening and Pap tests.  Colorectal cancer can be detected and often prevented. Most routine colorectal cancer screening begins at the age of 50 years and continues through age 75 years. However, your health care provider may recommend screening at an earlier age if you have risk factors for colon cancer. On a yearly basis, your health care provider may provide home test kits to check  for hidden blood in the stool. Use of a small camera at the end of a tube, to directly examine the colon (sigmoidoscopy or colonoscopy), can detect the earliest forms of colorectal cancer. Talk to your health care provider about this at age 50, when routine screening begins. Direct exam of the colon should be repeated every 5-10 years through age 75 years, unless early forms of precancerous polyps or small growths are found.  People who are at an increased risk for hepatitis B should be screened for this virus. You are considered at high risk for hepatitis B if:  You were born in a country where hepatitis B occurs often. Talk with your health care provider about which countries are considered high risk.  Your parents were born in a high-risk country and you have not received a shot to protect against hepatitis B (hepatitis B vaccine).  You have HIV or AIDS.  You use needles to inject street drugs.  You live with, or have sex with, someone who has hepatitis B.  You get hemodialysis treatment.  You take certain medicines for conditions like cancer, organ transplantation, and autoimmune conditions.  Hepatitis C blood testing is recommended for all people born from 1945 through 1965 and any individual with known risks for hepatitis C.  Practice safe sex. Use condoms and avoid high-risk sexual practices to reduce the spread of sexually transmitted infections (STIs). STIs include gonorrhea, chlamydia, syphilis, trichomonas, herpes, HPV, and human immunodeficiency virus (HIV). Herpes, HIV, and HPV are viral illnesses that have no cure. They can result in disability, cancer, and death.  You should be screened for sexually transmitted illnesses (STIs) including gonorrhea and chlamydia if:  You are sexually active and are younger than 24 years.  You are older than 24 years and your health care provider tells you that you are at risk for this type of infection.  Your sexual activity has changed  since you were last screened and you are at an increased risk for chlamydia or gonorrhea. Ask your health care provider if you are at risk.  If you are at risk of being infected with HIV, it is recommended that you take a prescription medicine daily to prevent HIV infection. This is called preexposure prophylaxis (PrEP). You are considered at risk if:  You are sexually active and do not regularly use condoms or know the HIV status of your partner(s).  You take drugs by injection.  You are sexually active with a partner who has HIV.  Talk with your health care provider about whether you are at high risk of being infected with HIV. If   you choose to begin PrEP, you should first be tested for HIV. You should then be tested every 3 months for as long as you are taking PrEP.  Osteoporosis is a disease in which the bones lose minerals and strength with aging. This can result in serious bone fractures or breaks. The risk of osteoporosis can be identified using a bone density scan. Women ages 67 years and over and women at risk for fractures or osteoporosis should discuss screening with their health care providers. Ask your health care provider whether you should take a calcium supplement or vitamin D to reduce the rate of osteoporosis.  Menopause can be associated with physical symptoms and risks. Hormone replacement therapy is available to decrease symptoms and risks. You should talk to your health care provider about whether hormone replacement therapy is right for you.  Use sunscreen. Apply sunscreen liberally and repeatedly throughout the day. You should seek shade when your shadow is shorter than you. Protect yourself by wearing long sleeves, pants, a wide-brimmed hat, and sunglasses year round, whenever you are outdoors.  Once a month, do a whole body skin exam, using a mirror to look at the skin on your back. Tell your health care provider of new moles, moles that have irregular borders, moles that  are larger than a pencil eraser, or moles that have changed in shape or color.  Stay current with required vaccines (immunizations).  Influenza vaccine. All adults should be immunized every year.  Tetanus, diphtheria, and acellular pertussis (Td, Tdap) vaccine. Pregnant women should receive 1 dose of Tdap vaccine during each pregnancy. The dose should be obtained regardless of the length of time since the last dose. Immunization is preferred during the 27th-36th week of gestation. An adult who has not previously received Tdap or who does not know her vaccine status should receive 1 dose of Tdap. This initial dose should be followed by tetanus and diphtheria toxoids (Td) booster doses every 10 years. Adults with an unknown or incomplete history of completing a 3-dose immunization series with Td-containing vaccines should begin or complete a primary immunization series including a Tdap dose. Adults should receive a Td booster every 10 years.  Varicella vaccine. An adult without evidence of immunity to varicella should receive 2 doses or a second dose if she has previously received 1 dose. Pregnant females who do not have evidence of immunity should receive the first dose after pregnancy. This first dose should be obtained before leaving the health care facility. The second dose should be obtained 4-8 weeks after the first dose.  Human papillomavirus (HPV) vaccine. Females aged 13-26 years who have not received the vaccine previously should obtain the 3-dose series. The vaccine is not recommended for use in pregnant females. However, pregnancy testing is not needed before receiving a dose. If a female is found to be pregnant after receiving a dose, no treatment is needed. In that case, the remaining doses should be delayed until after the pregnancy. Immunization is recommended for any person with an immunocompromised condition through the age of 61 years if she did not get any or all doses earlier. During the  3-dose series, the second dose should be obtained 4-8 weeks after the first dose. The third dose should be obtained 24 weeks after the first dose and 16 weeks after the second dose.  Zoster vaccine. One dose is recommended for adults aged 30 years or older unless certain conditions are present.  Measles, mumps, and rubella (MMR) vaccine. Adults born  before 1957 generally are considered immune to measles and mumps. Adults born in 1957 or later should have 1 or more doses of MMR vaccine unless there is a contraindication to the vaccine or there is laboratory evidence of immunity to each of the three diseases. A routine second dose of MMR vaccine should be obtained at least 28 days after the first dose for students attending postsecondary schools, health care workers, or international travelers. People who received inactivated measles vaccine or an unknown type of measles vaccine during 1963-1967 should receive 2 doses of MMR vaccine. People who received inactivated mumps vaccine or an unknown type of mumps vaccine before 1979 and are at high risk for mumps infection should consider immunization with 2 doses of MMR vaccine. For females of childbearing age, rubella immunity should be determined. If there is no evidence of immunity, females who are not pregnant should be vaccinated. If there is no evidence of immunity, females who are pregnant should delay immunization until after pregnancy. Unvaccinated health care workers born before 1957 who lack laboratory evidence of measles, mumps, or rubella immunity or laboratory confirmation of disease should consider measles and mumps immunization with 2 doses of MMR vaccine or rubella immunization with 1 dose of MMR vaccine.  Pneumococcal 13-valent conjugate (PCV13) vaccine. When indicated, a person who is uncertain of his immunization history and has no record of immunization should receive the PCV13 vaccine. All adults 65 years of age and older should receive this  vaccine. An adult aged 19 years or older who has certain medical conditions and has not been previously immunized should receive 1 dose of PCV13 vaccine. This PCV13 should be followed with a dose of pneumococcal polysaccharide (PPSV23) vaccine. Adults who are at high risk for pneumococcal disease should obtain the PPSV23 vaccine at least 8 weeks after the dose of PCV13 vaccine. Adults older than 77 years of age who have normal immune system function should obtain the PPSV23 vaccine dose at least 1 year after the dose of PCV13 vaccine.  Pneumococcal polysaccharide (PPSV23) vaccine. When PCV13 is also indicated, PCV13 should be obtained first. All adults aged 65 years and older should be immunized. An adult younger than age 65 years who has certain medical conditions should be immunized. Any person who resides in a nursing home or long-term care facility should be immunized. An adult smoker should be immunized. People with an immunocompromised condition and certain other conditions should receive both PCV13 and PPSV23 vaccines. People with human immunodeficiency virus (HIV) infection should be immunized as soon as possible after diagnosis. Immunization during chemotherapy or radiation therapy should be avoided. Routine use of PPSV23 vaccine is not recommended for American Indians, [place] Natives, or people younger than 65 years unless there are medical conditions that require PPSV23 vaccine. When indicated, people who have unknown immunization and have no record of immunization should receive PPSV23 vaccine. One-time revaccination 5 years after the first dose of PPSV23 is recommended for people aged 19-64 years who have chronic kidney failure, nephrotic syndrome, asplenia, or immunocompromised conditions. People who received 1-2 doses of PPSV23 before age 65 years should receive another dose of PPSV23 vaccine at age 65 years or later if at least 5 years have passed since the previous dose. Doses of PPSV23 are not  needed for people immunized with PPSV23 at or after age 65 years.  Meningococcal vaccine. Adults with asplenia or persistent complement component deficiencies should receive 2 doses of quadrivalent meningococcal conjugate (MenACWY-D) vaccine. The doses should be obtained   at least 2 months apart. Microbiologists working with certain meningococcal bacteria, [place] recruits, people at risk during an outbreak, and people who travel to or live in countries with a high rate of meningitis should be immunized. A first-year college student up through age 34 years who is living in a residence hall should receive a dose if she did not receive a dose on or after her 16th birthday. Adults who have certain high-risk conditions should receive one or more doses of vaccine.  Hepatitis A vaccine. Adults who wish to be protected from this disease, have certain high-risk conditions, work with hepatitis A-infected animals, work in hepatitis A research labs, or travel to or work in countries with a high rate of hepatitis A should be immunized. Adults who were previously unvaccinated and who anticipate close contact with an international adoptee during the first 60 days after arrival in the [place] from a country with a high rate of hepatitis A should be immunized.  Hepatitis B vaccine. Adults who wish to be protected from this disease, have certain high-risk conditions, may be exposed to blood or other infectious body fluids, are household contacts or sex partners of hepatitis B positive people, are clients or workers in certain care facilities, or travel to or work in countries with a high rate of hepatitis B should be immunized.  Haemophilus influenzae type b (Hib) vaccine. A previously unvaccinated person with asplenia or sickle cell disease or having a scheduled splenectomy should receive 1 dose of Hib vaccine. Regardless of previous immunization, a recipient of a hematopoietic stem cell transplant should receive a  3-dose series 6-12 months after her successful transplant. Hib vaccine is not recommended for adults with HIV infection. Preventive Services / Frequency Ages 35 to 4 years  Blood pressure check.** / Every 3-5 years.  Lipid and cholesterol check.** / Every 5 years beginning at age 60.  Clinical breast exam.** / Every 3 years for women in their 71s and 10s.  BRCA-related cancer risk assessment.** / For women who have family members with a BRCA-related cancer (breast, ovarian, tubal, or peritoneal cancers).  Pap test.** / Every 2 years from ages 76 through 26. Every 3 years starting at age 61 through age 76 or 93 with a history of 3 consecutive normal Pap tests.  HPV screening.** / Every 3 years from ages 37 through ages 60 to 51 with a history of 3 consecutive normal Pap tests.  Hepatitis C blood test.** / For any individual with known risks for hepatitis C.  Skin self-exam. / Monthly.  Influenza vaccine. / Every year.  Tetanus, diphtheria, and acellular pertussis (Tdap, Td) vaccine.** / Consult your health care provider. Pregnant women should receive 1 dose of Tdap vaccine during each pregnancy. 1 dose of Td every 10 years.  Varicella vaccine.** / Consult your health care provider. Pregnant females who do not have evidence of immunity should receive the first dose after pregnancy.  HPV vaccine. / 3 doses over 6 months, if 93 and younger. The vaccine is not recommended for use in pregnant females. However, pregnancy testing is not needed before receiving a dose.  Measles, mumps, rubella (MMR) vaccine.** / You need at least 1 dose of MMR if you were born in 1957 or later. You may also need a 2nd dose. For females of childbearing age, rubella immunity should be determined. If there is no evidence of immunity, females who are not pregnant should be vaccinated. If there is no evidence of immunity, females who are  pregnant should delay immunization until after pregnancy.  Pneumococcal  13-valent conjugate (PCV13) vaccine.** / Consult your health care provider.  Pneumococcal polysaccharide (PPSV23) vaccine.** / 1 to 2 doses if you smoke cigarettes or if you have certain conditions.  Meningococcal vaccine.** / 1 dose if you are age 68 to 8 years and a Market researcher living in a residence hall, or have one of several medical conditions, you need to get vaccinated against meningococcal disease. You may also need additional booster doses.  Hepatitis A vaccine.** / Consult your health care provider.  Hepatitis B vaccine.** / Consult your health care provider.  Haemophilus influenzae type b (Hib) vaccine.** / Consult your health care provider. Ages 7 to 53 years  Blood pressure check.** / Every year.  Lipid and cholesterol check.** / Every 5 years beginning at age 25 years.  Lung cancer screening. / Every year if you are aged 11-80 years and have a 30-pack-year history of smoking and currently smoke or have quit within the past 15 years. Yearly screening is stopped once you have quit smoking for at least 15 years or develop a health problem that would prevent you from having lung cancer treatment.  Clinical breast exam.** / Every year after age 48 years.  BRCA-related cancer risk assessment.** / For women who have family members with a BRCA-related cancer (breast, ovarian, tubal, or peritoneal cancers).  Mammogram.** / Every year beginning at age 41 years and continuing for as long as you are in good health. Consult with your health care provider.  Pap test.** / Every 3 years starting at age 65 years through age 37 or 70 years with a history of 3 consecutive normal Pap tests.  HPV screening.** / Every 3 years from ages 72 years through ages 60 to 40 years with a history of 3 consecutive normal Pap tests.  Fecal occult blood test (FOBT) of stool. / Every year beginning at age 21 years and continuing until age 5 years. You may not need to do this test if you get  a colonoscopy every 10 years.  Flexible sigmoidoscopy or colonoscopy.** / Every 5 years for a flexible sigmoidoscopy or every 10 years for a colonoscopy beginning at age 35 years and continuing until age 48 years.  Hepatitis C blood test.** / For all people born from [birth year] through 1965 and any individual with known risks for hepatitis C.  Skin self-exam. / Monthly.  Influenza vaccine. / Every year.  Tetanus, diphtheria, and acellular pertussis (Tdap/Td) vaccine.** / Consult your health care provider. Pregnant women should receive 1 dose of Tdap vaccine during each pregnancy. 1 dose of Td every 10 years.  Varicella vaccine.** / Consult your health care provider. Pregnant females who do not have evidence of immunity should receive the first dose after pregnancy.  Zoster vaccine.** / 1 dose for adults aged 30 years or older.  Measles, mumps, rubella (MMR) vaccine.** / You need at least 1 dose of MMR if you were born in 1957 or later. You may also need a second dose. For females of childbearing age, rubella immunity should be determined. If there is no evidence of immunity, females who are not pregnant should be vaccinated. If there is no evidence of immunity, females who are pregnant should delay immunization until after pregnancy.  Pneumococcal 13-valent conjugate (PCV13) vaccine.** / Consult your health care provider.  Pneumococcal polysaccharide (PPSV23) vaccine.** / 1 to 2 doses if you smoke cigarettes or if you have certain conditions.  Meningococcal vaccine.** /  Consult your health care provider.  Hepatitis A vaccine.** / Consult your health care provider.  Hepatitis B vaccine.** / Consult your health care provider.  Haemophilus influenzae type b (Hib) vaccine.** / Consult your health care provider. Ages 64 years and over  Blood pressure check.** / Every year.  Lipid and cholesterol check.** / Every 5 years beginning at age 23 years.  Lung cancer screening. / Every year if you  are aged 16-80 years and have a 30-pack-year history of smoking and currently smoke or have quit within the past 15 years. Yearly screening is stopped once you have quit smoking for at least 15 years or develop a health problem that would prevent you from having lung cancer treatment.  Clinical breast exam.** / Every year after age 74 years.  BRCA-related cancer risk assessment.** / For women who have family members with a BRCA-related cancer (breast, ovarian, tubal, or peritoneal cancers).  Mammogram.** / Every year beginning at age 44 years and continuing for as long as you are in good health. Consult with your health care provider.  Pap test.** / Every 3 years starting at age 58 years through age 22 or 39 years with 3 consecutive normal Pap tests. Testing can be stopped between 65 and 70 years with 3 consecutive normal Pap tests and no abnormal Pap or HPV tests in the past 10 years.  HPV screening.** / Every 3 years from ages 64 years through ages 70 or 61 years with a history of 3 consecutive normal Pap tests. Testing can be stopped between 65 and 70 years with 3 consecutive normal Pap tests and no abnormal Pap or HPV tests in the past 10 years.  Fecal occult blood test (FOBT) of stool. / Every year beginning at age 40 years and continuing until age 27 years. You may not need to do this test if you get a colonoscopy every 10 years.  Flexible sigmoidoscopy or colonoscopy.** / Every 5 years for a flexible sigmoidoscopy or every 10 years for a colonoscopy beginning at age 7 years and continuing until age 32 years.  Hepatitis C blood test.** / For all people born from [birth year] through 1967 and any individual with known risks for hepatitis C.  Osteoporosis screening.** / A one-time screening for women ages 30 years and over and women at risk for fractures or osteoporosis.  Skin self-exam. / Monthly.  Influenza vaccine. / Every year.  Tetanus, diphtheria, and acellular pertussis (Tdap/Td)  vaccine.** / 1 dose of Td every 10 years.  Varicella vaccine.** / Consult your health care provider.  Zoster vaccine.** / 1 dose for adults aged 35 years or older.  Pneumococcal 13-valent conjugate (PCV13) vaccine.** / Consult your health care provider.  Pneumococcal polysaccharide (PPSV23) vaccine.** / 1 dose for all adults aged 46 years and older.  Meningococcal vaccine.** / Consult your health care provider.  Hepatitis A vaccine.** / Consult your health care provider.  Hepatitis B vaccine.** / Consult your health care provider.  Haemophilus influenzae type b (Hib) vaccine.** / Consult your health care provider. ** Family history and personal history of risk and conditions may change your health care provider's recommendations.   This information is not intended to replace advice given to you by your health care provider. Make sure you discuss any questions you have with your health care provider.   Document Released: 09/26/2001 Document Revised: 08/21/2014 Document Reviewed: 12/26/2010 Elsevier Interactive Patient Education [employer].

## 2015-10-14 NOTE — Progress Notes (Signed)
Subjective:  Patient ID: Emily Sweeney, female    DOB: 07-13-1941  Age: 75 y.o. MRN: [id number]  CC: Annual Exam; Anemia; Hypertension; and Hyperlipidemia   HPI Leellen Chavero presents for a CPX.  She needs a follow-up on anemia, her only significant complaint is that she occasionally sees blood in her toilet paper. She denies fatigue, paresthesias, or shortness of breath.  Her blood pressures been well controlled on Bystolic and she has had no more episodes of palpitations. She denies chest pain, shortness of breath, dizziness, syncope, edema, or diaphoresis.  Outpatient Prescriptions Prior to Visit  Medication Sig Dispense Refill  . aspirin EC 81 MG tablet Take 1 tablet (81 mg total) by mouth daily. 90 tablet 1  . atorvastatin (LIPITOR) 20 MG tablet TAKE 1 TABLET ONCE DAILY. 90 tablet 3  . calcium carbonate (TUMS - DOSED IN MG ELEMENTAL CALCIUM) 500 MG chewable tablet Chew 1 tablet by mouth daily.    Marland Kitchen glycopyrrolate (ROBINUL) 2 MG tablet Take 1 tablet (2 mg total) by mouth daily. 30 tablet 3  . mesalamine (LIALDA) 1.2 g EC tablet Take 1 tablet (1.2 g total) by mouth daily with breakfast. 30 tablet 6  . naproxen sodium (ANAPROX) 220 MG tablet Take 220 mg by mouth 2 (two) times daily with a meal.    . nebivolol (BYSTOLIC) 5 MG tablet Take 1 tablet (5 mg total) by mouth daily. 30 tablet 11  . polyvinyl alcohol (LIQUIFILM TEARS) 1.4 % ophthalmic solution Place 1 drop into both eyes daily as needed for dry eyes.    . ramipril (ALTACE) 10 MG capsule TAKE (1) CAPSULE DAILY. 90 capsule 1  . VITAMIN D, CHOLECALCIFEROL, PO Take 5,000 Units by mouth daily.      No facility-administered medications prior to visit.    ROS Review of Systems  Constitutional: Negative.  Negative for fever, chills, diaphoresis, appetite change and fatigue.  HENT: Negative.  Negative for trouble swallowing and voice change.   Eyes: Negative.   Respiratory: Negative.  Negative for cough, choking, chest  tightness, shortness of breath and stridor.   Cardiovascular: Negative.  Negative for chest pain, palpitations and leg swelling.  Gastrointestinal: Positive for blood in stool and anal bleeding. Negative for nausea, vomiting, abdominal pain, diarrhea and constipation.  Endocrine: Negative.   Genitourinary: Negative.   Musculoskeletal: Negative.  Negative for myalgias, back pain, joint swelling and arthralgias.  Skin: Negative.  Negative for color change and rash.  Allergic/Immunologic: Negative.   Neurological: Negative.  Negative for dizziness, syncope, speech difficulty, light-headedness, numbness and headaches.  Hematological: Negative.  Negative for adenopathy. Does not bruise/bleed easily.  Psychiatric/Behavioral: Negative.     Objective:  BP 142/82 mmHg  Pulse 66  Temp(Src) 97.8 F (36.6 C) (Oral)  Resp 16  Ht 5\' 6"  (1.676 m)  Wt 146 lb (66.225 kg)  BMI 23.58 kg/m2  SpO2 99%  BP Readings from Last 3 Encounters:  10/14/15 142/82  06/30/15 118/70  06/09/15 140/70    Wt Readings from Last 3 Encounters:  10/14/15 146 lb (66.225 kg)  06/30/15 149 lb (67.586 kg)  06/09/15 150 lb (68.04 kg)    Physical Exam  Constitutional: She is oriented to person, place, and time. She appears well-developed and well-nourished. No distress.  HENT:  Head: Normocephalic and atraumatic.  Mouth/Throat: Oropharynx is clear and moist. No oropharyngeal exudate.  Eyes: Conjunctivae are normal. Right eye exhibits no discharge. Left eye exhibits no discharge. No scleral icterus.  Neck: Normal range of motion.  Neck supple. No JVD present. No tracheal deviation present. No thyromegaly present.  Cardiovascular: Normal rate, regular rhythm, normal heart sounds and intact distal pulses.  Exam reveals no gallop and no friction rub.   No murmur heard. Pulmonary/Chest: Effort normal and breath sounds normal. No stridor. No respiratory distress. She has no wheezes. She has no rales. She exhibits no  tenderness.  Abdominal: Soft. Bowel sounds are normal. She exhibits no distension and no mass. There is no tenderness. There is no rebound and no guarding.  Genitourinary: Rectal exam shows external hemorrhoid. Rectal exam shows no internal hemorrhoid, no fissure, no mass, no tenderness and anal tone normal. Guaiac negative stool.  There are 2 tiny external anal hemorrhoids that are uncomplicated, they're nontender and nonthrombosed.  Musculoskeletal: Normal range of motion. She exhibits no edema or tenderness.  Lymphadenopathy:    She has no cervical adenopathy.  Neurological: She is oriented to person, place, and time.  Skin: Skin is warm and dry. No rash noted. She is not diaphoretic. No erythema. No pallor.  Psychiatric: She has a normal mood and affect. Her behavior is normal. Judgment and thought content normal.  Vitals reviewed.   Lab Results  Component Value Date   WBC 6.2 10/14/2015   HGB 9.0* 10/14/2015   HCT 28.5* 10/14/2015   PLT 258.0 10/14/2015   GLUCOSE 117* 10/14/2015   CHOL 186 10/14/2015   TRIG 92.0 10/14/2015   HDL 76.40 10/14/2015   LDLCALC 92 10/14/2015   ALT 10 10/16/2014   AST 17 10/16/2014   NA 138 10/14/2015   K 4.7 10/14/2015   CL 104 10/14/2015   CREATININE 0.86 10/14/2015   BUN 17 10/14/2015   CO2 26 10/14/2015   TSH 1.77 10/14/2015   INR 1.0 06/09/2015   HGBA1C 6.4 10/14/2015    No results found.  Assessment & Plan:   Bezawit was seen today for annual exam, anemia, hypertension and hyperlipidemia.  Diagnoses and all orders for this visit:  Pernicious anemia- her anemia has worsened and her B12 level is low, will start replacing her B12 with a weekly injection of 1000 g once a week for 3 weeks and then will continue monthly. -     CBC with Differential/Platelet; Future -     Vitamin B12; Future -     Methylmalonic acid, serum; Future  Hyperglycemia- she has prediabetes, no medications are needed at this time, will continue to  monitor. -     Basic metabolic panel; Future -     Hemoglobin A1c; Future  HYPERCHOLESTEROLEMIA, PURE- she has achieved her LDL goal is doing well on the statin. -     Lipid panel; Future -     TSH; Future  Essential hypertension- her blood pressure is well-controlled, electrolytes and renal function are stable. -     Basic metabolic panel; Future  Need for 23-polyvalent pneumococcal polysaccharide vaccine -     Pneumococcal polysaccharide vaccine 23-valent greater than or equal to 2yo subcutaneous/IM  B12 deficiency -     cyanocobalamin ((VITAMIN B-12)) injection 1,000 mcg; Inject 1 mL (1,000 mcg total) into the muscle once.  I am having Ms. Ratzel maintain her aspirin EC, atorvastatin, naproxen sodium, calcium carbonate, ramipril, nebivolol, (VITAMIN D, CHOLECALCIFEROL, PO), glycopyrrolate, polyvinyl alcohol, and mesalamine. We administered cyanocobalamin.  Meds ordered this encounter  Medications  . cyanocobalamin ((VITAMIN B-12)) injection 1,000 mcg    Sig:    See AVS for instructions about healthy living and anticipatory guidance.  Follow-up: Return  in about 6 months (around 04/15/2016).  Scarlette Calico, MD

## 2015-10-17 DIAGNOSIS — E538 Deficiency of other specified B group vitamins: Secondary | ICD-10-CM | POA: Insufficient documentation

## 2015-10-17 DIAGNOSIS — Z0000 Encounter for general adult medical examination without abnormal findings: Secondary | ICD-10-CM | POA: Insufficient documentation

## 2015-10-17 LAB — METHYLMALONIC ACID, SERUM: METHYLMALONIC ACID, QUANT: 262 nmol/L (ref 87–318)

## 2015-10-17 NOTE — Assessment & Plan Note (Signed)

## 2015-10-21 ENCOUNTER — Ambulatory Visit (INDEPENDENT_AMBULATORY_CARE_PROVIDER_SITE_OTHER): Payer: Medicare Other

## 2015-10-21 ENCOUNTER — Ambulatory Visit: Payer: Medicare Other

## 2015-10-21 DIAGNOSIS — E538 Deficiency of other specified B group vitamins: Secondary | ICD-10-CM

## 2015-10-21 MED ORDER — CYANOCOBALAMIN 1000 MCG/ML IJ SOLN
1000.0000 ug | Freq: Once | INTRAMUSCULAR | Status: AC
  Administered 2015-10-21: 1000 ug via INTRAMUSCULAR

## 2015-10-28 ENCOUNTER — Telehealth: Payer: Self-pay | Admitting: Internal Medicine

## 2015-10-28 ENCOUNTER — Ambulatory Visit (INDEPENDENT_AMBULATORY_CARE_PROVIDER_SITE_OTHER): Payer: Medicare Other

## 2015-10-28 DIAGNOSIS — E538 Deficiency of other specified B group vitamins: Secondary | ICD-10-CM | POA: Diagnosis not present

## 2015-10-28 MED ORDER — CYANOCOBALAMIN 1000 MCG/ML IJ SOLN
1000.0000 ug | Freq: Once | INTRAMUSCULAR | Status: AC
  Administered 2015-10-28: 1000 ug via INTRAMUSCULAR

## 2015-10-28 NOTE — Telephone Encounter (Signed)
Pt would like to to a female provider.  She would like to [facility].  Is this ok with both of you?

## 2015-10-28 NOTE — Telephone Encounter (Signed)
yes

## 2015-10-29 NOTE — Telephone Encounter (Signed)
Ok

## 2015-10-30 ENCOUNTER — Other Ambulatory Visit: Payer: Self-pay | Admitting: Internal Medicine

## 2015-12-01 ENCOUNTER — Ambulatory Visit (INDEPENDENT_AMBULATORY_CARE_PROVIDER_SITE_OTHER): Payer: Medicare Other

## 2015-12-01 DIAGNOSIS — E538 Deficiency of other specified B group vitamins: Secondary | ICD-10-CM | POA: Diagnosis not present

## 2015-12-01 MED ORDER — CYANOCOBALAMIN 1000 MCG/ML IJ SOLN
1000.0000 ug | Freq: Once | INTRAMUSCULAR | Status: AC
  Administered 2015-12-01: 1000 ug via INTRAMUSCULAR

## 2015-12-31 ENCOUNTER — Ambulatory Visit (INDEPENDENT_AMBULATORY_CARE_PROVIDER_SITE_OTHER): Payer: Medicare Other

## 2015-12-31 DIAGNOSIS — E538 Deficiency of other specified B group vitamins: Secondary | ICD-10-CM

## 2015-12-31 MED ORDER — CYANOCOBALAMIN 1000 MCG/ML IJ SOLN
1000.0000 ug | Freq: Once | INTRAMUSCULAR | Status: AC
  Administered 2015-12-31: 1000 ug via INTRAMUSCULAR

## 2016-01-19 ENCOUNTER — Ambulatory Visit (INDEPENDENT_AMBULATORY_CARE_PROVIDER_SITE_OTHER): Payer: Medicare Other | Admitting: Internal Medicine

## 2016-01-19 ENCOUNTER — Encounter: Payer: Self-pay | Admitting: Internal Medicine

## 2016-01-19 VITALS — BP 130/82 | HR 67 | Temp 98.3°F | Resp 16 | Wt 148.0 lb

## 2016-01-19 DIAGNOSIS — Z9889 Other specified postprocedural states: Secondary | ICD-10-CM | POA: Diagnosis not present

## 2016-01-19 DIAGNOSIS — R141 Gas pain: Secondary | ICD-10-CM | POA: Insufficient documentation

## 2016-01-19 DIAGNOSIS — E7800 Pure hypercholesterolemia, unspecified: Secondary | ICD-10-CM

## 2016-01-19 DIAGNOSIS — L299 Pruritus, unspecified: Secondary | ICD-10-CM

## 2016-01-19 DIAGNOSIS — R739 Hyperglycemia, unspecified: Secondary | ICD-10-CM

## 2016-01-19 DIAGNOSIS — M47817 Spondylosis without myelopathy or radiculopathy, lumbosacral region: Secondary | ICD-10-CM | POA: Diagnosis not present

## 2016-01-19 DIAGNOSIS — I10 Essential (primary) hypertension: Secondary | ICD-10-CM | POA: Diagnosis not present

## 2016-01-19 DIAGNOSIS — I493 Ventricular premature depolarization: Secondary | ICD-10-CM

## 2016-01-19 DIAGNOSIS — D510 Vitamin B12 deficiency anemia due to intrinsic factor deficiency: Secondary | ICD-10-CM | POA: Diagnosis not present

## 2016-01-19 DIAGNOSIS — E538 Deficiency of other specified B group vitamins: Secondary | ICD-10-CM

## 2016-01-19 DIAGNOSIS — Z86018 Personal history of other benign neoplasm: Secondary | ICD-10-CM

## 2016-01-19 MED ORDER — CYANOCOBALAMIN 1000 MCG/ML IJ SOLN: 1000.0000 ug | Freq: Once | INTRAMUSCULAR | Status: DC

## 2016-01-19 NOTE — Assessment & Plan Note (Addendum)
Saw cardiology Controlled with bystolic - continue at current dose

## 2016-01-19 NOTE — Progress Notes (Signed)
Pre visit review using our clinic review tool, if applicable. No additional management support is needed unless otherwise documented below in the visit note. 

## 2016-01-19 NOTE — Assessment & Plan Note (Signed)
Continue monthly B12 injections

## 2016-01-19 NOTE — Assessment & Plan Note (Signed)
Monthly B12 injections. 

## 2016-01-19 NOTE — Assessment & Plan Note (Signed)
BP well controlled Current regimen effective and well tolerated Continue current medications at current doses  

## 2016-01-19 NOTE — Assessment & Plan Note (Signed)
Unknown cause. One of her areas may be nerve related  Treat symptomatically Can take an antihistamine daily if needed

## 2016-01-19 NOTE — Assessment & Plan Note (Addendum)
Taking atorvastatin 20 mg daily Lipid panel well controlled-continue above

## 2016-01-19 NOTE — Patient Instructions (Signed)
Have blood work done one week prior to your next appointment.     Medications reviewed and updated.  Changes include stopping the lialda.     Please followup in 6 months

## 2016-01-19 NOTE — Assessment & Plan Note (Signed)
Saw GI and at that time was Increased, Abdominal Pain and Occasional Blood in Stool Diverticulitis Was Ruled Out with CT Scan Placed on Glycopyrrolate and Mesalamine Pain Has Stopped Still Has Some Gas and Occasional Blood, Bowel Movements Normal Since there was no evidence of diverticulitis or colitis we will go ahead and discontinue the mesalamine, she is starting discontinue the glycopyrrolate Bleeding is likely diverticulosis or hemorrhoidal in nature Monitor symptoms and see if we need to have her return to GI

## 2016-01-19 NOTE — Progress Notes (Signed)
Subjective:    Patient ID: Emily Sweeney, female    DOB: Jul 07, 1941, 75 y.o.   MRN: [id number]  HPI She is here to establish with a new pcp.   She saw GI back in October (lower quadrant pain, rectal bleeding and increased gas. There is concern for possible diverticulitis and a CT scan was done, which showed no evidence of colitis. She was started on a couple of medications including Bentyl and lialda.  She continues to have a lot of gas and pressure in her rib area, which is intermittent. She still has some occasional blood in her stool. She does have diverticulosis and hemorrhoids. She had a colonoscopy one year ago and was normal except for polyps. Her bowel movements are normal. She is not having any abdominal cramping. She wondered if she could consider stopping the mesalamine.    B12 def:  She started B12 infections recently - she is unsure how long she needs the injections.  Initially she was advised for two more months.  She does have a history of pernicious anemia, which was confirmed by blood work years ago.    Hypertension: She is taking her medication daily. She is compliant with a low sodium diet.  She denies palpitations, edema, shortness of breath and regular headaches. She has occasional chest pain, but did have a normal stress test last summer. She is exercising regularly.  She does monitor her blood pressure at home.    Spondylosis:  She has spondyloss in her back.  She has pain with certain acitivities.  She has a back brace she uses at times.  She did PT adn it helped.  She had two epidural injections.  She did have leg pain at one point and it was severe, but hat is better.    Itching:  She sometimes has generalized itching or sometimes it is localized.  It is intermittent.  She denies redness in her skin, hives.    Medications and allergies reviewed with patient and updated if appropriate.  Patient Active Problem List   Diagnosis Date Noted  . Routine general medical  examination at a health care facility 10/17/2015  . B12 deficiency 10/17/2015  . Pernicious anemia 06/09/2015  . PVC (premature ventricular contraction) 02/25/2015  . Osteopenia, senile 01/12/2015  . Vitamin D deficiency 01/12/2015  . Nonspecific abnormal electrocardiogram (ECG) (EKG) 10/16/2014  . Hyperglycemia 12/18/2011  . Other screening mammogram 12/18/2011  . DIVERTICULITIS OF COLON 10/11/2009  . HYPERCHOLESTEROLEMIA, PURE 10/21/2008  . Essential hypertension 10/21/2008    Current Outpatient Prescriptions on File Prior to Visit  Medication Sig Dispense Refill  . aspirin EC 81 MG tablet Take 1 tablet (81 mg total) by mouth daily. 90 tablet 1  . atorvastatin (LIPITOR) 20 MG tablet TAKE 1 TABLET ONCE DAILY. 90 tablet 3  . calcium carbonate (TUMS - DOSED IN MG ELEMENTAL CALCIUM) 500 MG chewable tablet Chew 1 tablet by mouth daily.    . mesalamine (LIALDA) 1.2 g EC tablet Take 1 tablet (1.2 g total) by mouth daily with breakfast. 30 tablet 6  . naproxen sodium (ANAPROX) 220 MG tablet Take 220 mg by mouth 2 (two) times daily with a meal.    . nebivolol (BYSTOLIC) 5 MG tablet Take 1 tablet (5 mg total) by mouth daily. 30 tablet 11  . polyvinyl alcohol (LIQUIFILM TEARS) 1.4 % ophthalmic solution Place 1 drop into both eyes daily as needed for dry eyes.    . ramipril (ALTACE) 10 MG capsule TAKE (  1) CAPSULE DAILY. 90 capsule 2  . VITAMIN D, CHOLECALCIFEROL, PO Take 5,000 Units by mouth daily.      No current facility-administered medications on file prior to visit.    Past Medical History  Diagnosis Date  . Anemia   . Diverticulosis of colon   . Hyperlipidemia   . Hypertension   . Hearing loss     right ear, and tinnitus  . Blood transfusion without reported diagnosis   . GERD (gastroesophageal reflux disease)   . Tubular adenoma 03/05/2015    Polyp, and Benign lymphoid polyp.    Past Surgical History  Procedure Laterality Date  . Craniectomy for excision of acoustic neuroma     . Appendectomy    . Breast lumpectomy    . Tonsillectomy    . Nasal septum surgery    . Cochlear implant    . Breast biopsy      Social History   Social History  . Marital Status: Divorced    Spouse Name: N/A  . Number of Children: N/A  . Years of Education: N/A   Occupational History  . psychologist    Social History Main Topics  . Smoking status: Former Smoker    Quit date: 07/04/1991  . Smokeless tobacco: Never Used  . Alcohol Use: 0.0 oz/week    0 Standard drinks or equivalent per week     Comment: occasionally  . Drug Use: No  . Sexual Activity: Not Currently   Other Topics Concern  . None   Social History Narrative    Family History  Problem Relation Age of Onset  . Sudden death Father 70    MI  . Coronary artery disease Father   . Dementia Mother 15  . Colon cancer Neg Hx     Review of Systems  Constitutional: Negative for fever, chills, appetite change, fatigue and unexpected weight change.  Respiratory: Positive for cough (pnd). Negative for shortness of breath and wheezing.   Cardiovascular: Positive for chest pain (occasional, rest or exertion - neg stress test 02/2015). Negative for palpitations (since starting bystolic) and leg swelling.  Gastrointestinal: Negative for abdominal pain, diarrhea, constipation and blood in stool.       Occasional blood on tissue  Musculoskeletal: Positive for back pain (intermittent) and arthralgias (left knee weak at times).  Neurological: Positive for dizziness (some poor balance since acoustic neuroma removed) and light-headedness (sometimes). Negative for headaches.       Objective:   Filed Vitals:   01/19/16 1404  BP: 130/82  Pulse: 67  Temp: 98.3 F (36.8 C)  Resp: 16   Filed Weights   01/19/16 1404  Weight: 148 lb (67.132 kg)   Body mass index is 23.9 kg/(m^2).   Physical Exam Constitutional: Appears well-developed and well-nourished. No distress.  Neck: Neck supple. No tracheal deviation  present. No thyromegaly present.  No carotid bruit. No cervical adenopathy.   Cardiovascular: Normal rate, regular rhythm and normal heart sounds.   No murmur heard.  No edema Pulmonary/Chest: Effort normal and breath sounds normal. No respiratory distress. No wheezes.  Abdomen: Soft, nontender, nondistended, no hepatosplenomegaly, no masses       Assessment & Plan:   See Problem List for Assessment and Plan of chronic medical problems.  Follow-up in 6 months, sooner if needed

## 2016-01-19 NOTE — Assessment & Plan Note (Signed)
Prediabetic Monitor A1c

## 2016-01-27 ENCOUNTER — Other Ambulatory Visit: Payer: Self-pay | Admitting: Internal Medicine

## 2016-01-28 ENCOUNTER — Ambulatory Visit (INDEPENDENT_AMBULATORY_CARE_PROVIDER_SITE_OTHER): Payer: Medicare Other

## 2016-01-28 DIAGNOSIS — E538 Deficiency of other specified B group vitamins: Secondary | ICD-10-CM | POA: Diagnosis not present

## 2016-01-28 MED ORDER — CYANOCOBALAMIN 1000 MCG/ML IJ SOLN
1000.0000 ug | Freq: Once | INTRAMUSCULAR | Status: AC
  Administered 2016-01-28: 1000 ug via INTRAMUSCULAR

## 2016-03-01 ENCOUNTER — Ambulatory Visit (INDEPENDENT_AMBULATORY_CARE_PROVIDER_SITE_OTHER): Payer: Medicare Other

## 2016-03-01 DIAGNOSIS — E538 Deficiency of other specified B group vitamins: Secondary | ICD-10-CM | POA: Diagnosis not present

## 2016-03-01 MED ORDER — CYANOCOBALAMIN 1000 MCG/ML IJ SOLN
1000.0000 ug | Freq: Once | INTRAMUSCULAR | Status: AC
  Administered 2016-03-01: 1000 ug via INTRAMUSCULAR

## 2016-04-04 ENCOUNTER — Ambulatory Visit (INDEPENDENT_AMBULATORY_CARE_PROVIDER_SITE_OTHER): Payer: Medicare Other | Admitting: General Practice

## 2016-04-04 DIAGNOSIS — E538 Deficiency of other specified B group vitamins: Secondary | ICD-10-CM | POA: Diagnosis not present

## 2016-04-04 MED ORDER — CYANOCOBALAMIN 1000 MCG/ML IJ SOLN
1000.0000 ug | Freq: Once | INTRAMUSCULAR | Status: AC
  Administered 2016-04-04: 1000 ug via INTRAMUSCULAR

## 2016-04-27 DIAGNOSIS — R269 Unspecified abnormalities of gait and mobility: Secondary | ICD-10-CM | POA: Insufficient documentation

## 2016-05-02 ENCOUNTER — Ambulatory Visit (INDEPENDENT_AMBULATORY_CARE_PROVIDER_SITE_OTHER): Payer: Medicare Other | Admitting: General Practice

## 2016-05-02 DIAGNOSIS — E538 Deficiency of other specified B group vitamins: Secondary | ICD-10-CM

## 2016-05-02 DIAGNOSIS — Z23 Encounter for immunization: Secondary | ICD-10-CM

## 2016-05-02 MED ORDER — CYANOCOBALAMIN 1000 MCG/ML IJ SOLN
1000.0000 ug | Freq: Once | INTRAMUSCULAR | Status: AC
  Administered 2016-05-02: 1000 ug via INTRAMUSCULAR

## 2016-05-18 ENCOUNTER — Other Ambulatory Visit: Payer: Self-pay | Admitting: Internal Medicine

## 2016-05-18 DIAGNOSIS — I10 Essential (primary) hypertension: Secondary | ICD-10-CM

## 2016-05-18 DIAGNOSIS — R002 Palpitations: Secondary | ICD-10-CM

## 2016-05-24 ENCOUNTER — Ambulatory Visit (INDEPENDENT_AMBULATORY_CARE_PROVIDER_SITE_OTHER): Payer: Medicare Other | Admitting: Internal Medicine

## 2016-05-24 ENCOUNTER — Encounter: Payer: Self-pay | Admitting: Internal Medicine

## 2016-05-24 VITALS — BP 144/90 | HR 73 | Temp 98.2°F | Resp 16 | Wt 147.0 lb

## 2016-05-24 DIAGNOSIS — M47817 Spondylosis without myelopathy or radiculopathy, lumbosacral region: Secondary | ICD-10-CM

## 2016-05-24 DIAGNOSIS — R2689 Other abnormalities of gait and mobility: Secondary | ICD-10-CM | POA: Insufficient documentation

## 2016-05-24 DIAGNOSIS — Z9181 History of falling: Secondary | ICD-10-CM

## 2016-05-24 NOTE — Progress Notes (Signed)
Subjective:    Patient ID: Emily Sweeney, female    DOB: [birth year], 75 y.o.   MRN: [id number]  HPI She is here for an acute visit.   She is having trouble with balance and getting up and down stairs.  Her balance has not been as good for a while, but it is getting worse.    She had an acoustic neuroma removed several years ago and her balance was poor after that, but it did improve.  If she looks up she is likely to fall backwards.    She has chronic lumbar radiculopathy in her right leg.  She has done PT years ago and it helped.  She continues to do the exercises at home.  She has pain intermittently down her right leg.  She feels her right leg is slightly weak.  Her right knee feels like it will give out on her on occasion.  She has some intermittent knee pain.   She uses a back brace on a daily basis.  She uses it intermittently.      She is very careful and denies falls.    Medications and allergies reviewed with patient and updated if appropriate.  Patient Active Problem List   Diagnosis Date Noted  . Lumbosacral spondylosis without myelopathy 01/19/2016  . S/P excision of acoustic neuroma 01/19/2016  . Pruritus 01/19/2016  . Abdominal gas pain 01/19/2016  . B12 deficiency 10/17/2015  . Pernicious anemia 06/09/2015  . PVC (premature ventricular contraction) 02/25/2015  . Osteopenia, senile 01/12/2015  . Vitamin D deficiency 01/12/2015  . Nonspecific abnormal electrocardiogram (ECG) (EKG) 10/16/2014  . Hyperglycemia 12/18/2011  . DIVERTICULITIS OF COLON 10/11/2009  . HYPERCHOLESTEROLEMIA, PURE 10/21/2008  . Essential hypertension 10/21/2008    Current Outpatient Prescriptions on File Prior to Visit  Medication Sig Dispense Refill  . aspirin EC 81 MG tablet Take 1 tablet (81 mg total) by mouth daily. 90 tablet 1  . atorvastatin (LIPITOR) 20 MG tablet TAKE 1 TABLET ONCE DAILY. 90 tablet 3  . BYSTOLIC 5 MG tablet TAKE 1 TABLET ONCE DAILY. 30 tablet 11  . calcium  carbonate (TUMS - DOSED IN MG ELEMENTAL CALCIUM) 500 MG chewable tablet Chew 1 tablet by mouth daily.    . cyanocobalamin (,VITAMIN B-12,) 1000 MCG/ML injection Inject 1 mL (1,000 mcg total) into the muscle once. 1 mL 0  . naproxen sodium (ANAPROX) 220 MG tablet Take 220 mg by mouth 2 (two) times daily with a meal.    . polyvinyl alcohol (LIQUIFILM TEARS) 1.4 % ophthalmic solution Place 1 drop into both eyes daily as needed for dry eyes.    . ramipril (ALTACE) 10 MG capsule TAKE (1) CAPSULE DAILY. 90 capsule 2  . VITAMIN D, CHOLECALCIFEROL, PO Take 5,000 Units by mouth daily.      No current facility-administered medications on file prior to visit.     Past Medical History:  Diagnosis Date  . Anemia   . Blood transfusion without reported diagnosis   . Diverticulosis of colon   . GERD (gastroesophageal reflux disease)   . Hearing loss    right ear, and tinnitus  . Hyperlipidemia   . Hypertension   . Tubular adenoma 03/05/2015   Polyp, and Benign lymphoid polyp.    Past Surgical History:  Procedure Laterality Date  . APPENDECTOMY    . BREAST BIOPSY     benign  . BREAST LUMPECTOMY    . COCHLEAR IMPLANT    . CRANIECTOMY FOR EXCISION OF  ACOUSTIC NEUROMA  2009  . NASAL SEPTUM SURGERY    . TONSILLECTOMY      Social History   Social History  . Marital status: Divorced    Spouse name: N/A  . Number of children: N/A  . Years of education: N/A   Occupational History  . psychologist    Social History Main Topics  . Smoking status: Former Smoker    Quit date: 07/04/1991  . Smokeless tobacco: Never Used  . Alcohol use 0.0 oz/week     Comment: occasionally  . Drug use: No  . Sexual activity: Not Currently   Other Topics Concern  . Not on file   Social History Narrative  . No narrative on file    Family History  Problem Relation Age of Onset  . Sudden death Father 30    MI  . Coronary artery disease Father   . Dementia Mother 20  . Colon cancer Neg Hx      Review of Systems  Constitutional: Negative for chills and fever.  Musculoskeletal: Positive for arthralgias (knee on right 0- sometimes) and back pain (occasional). Negative for joint swelling (right knee).  Neurological: Positive for dizziness (occ spinning sensation), weakness (right leg), light-headedness (occasional) and numbness (occasional in right leg/foot, sometimes left leg). Negative for headaches.       Objective:   Vitals:   05/24/16 1542  BP: (!) 144/90  Pulse: 73  Resp: 16  Temp: 98.2 F (36.8 C)   Filed Weights   05/24/16 1542  Weight: 147 lb (66.7 kg)   Body mass index is 23.73 kg/m.   Physical Exam  Constitutional: She appears well-developed and well-nourished. No distress.  Cardiovascular: Normal rate, regular rhythm and normal heart sounds.   No murmur heard. Pulmonary/Chest: Effort normal and breath sounds normal. No respiratory distress. She has no wheezes. She has no rales.  Musculoskeletal: She exhibits no edema.  Tenderness lumbar spine with palpation  Neurological: She exhibits normal muscle tone.  Walks with limp, unsteady when getting up, positive romberg, normal sensation all extremities, slight decreased strength in RLE, normal strength all other extremities  Skin: She is not diaphoretic.  Psychiatric: She has a normal mood and affect. Her behavior is normal.        Assessment & Plan:   See Problem List for Assessment and Plan of chronic medical problems.

## 2016-05-24 NOTE — Assessment & Plan Note (Signed)
Chronic, intermittent leg pain Doing PT exercises at home Likely contributing to her poor balance Will refer back to PT to see if it helps

## 2016-05-24 NOTE — Progress Notes (Signed)
Pre visit review using our clinic review tool, if applicable. No additional management support is needed unless otherwise documented below in the visit note. 

## 2016-05-24 NOTE — Patient Instructions (Signed)
  Referrals for physical therapy and neurology were ordered.     Medications reviewed and updated.  No changes recommended at this time.

## 2016-05-24 NOTE — Assessment & Plan Note (Signed)
Likely multifactorial -  chronic right lumbar radiculopathy, balance difficulty since right acoustic neuroma, possible right knee arthritis Referred to PT Will refer to neuro for further evaluation

## 2016-05-24 NOTE — Assessment & Plan Note (Signed)
Discussed precautions for falling PT to help with balance Deferred walker,cane

## 2016-06-01 ENCOUNTER — Ambulatory Visit (INDEPENDENT_AMBULATORY_CARE_PROVIDER_SITE_OTHER): Payer: Medicare Other | Admitting: *Deleted

## 2016-06-01 DIAGNOSIS — E538 Deficiency of other specified B group vitamins: Secondary | ICD-10-CM | POA: Diagnosis not present

## 2016-06-01 MED ORDER — CYANOCOBALAMIN 1000 MCG/ML IJ SOLN
1000.0000 ug | Freq: Once | INTRAMUSCULAR | Status: AC
  Administered 2016-06-01: 1000 ug via INTRAMUSCULAR

## 2016-06-08 ENCOUNTER — Encounter: Payer: Self-pay | Admitting: Internal Medicine

## 2016-06-09 ENCOUNTER — Encounter: Payer: Self-pay | Admitting: Neurology

## 2016-06-09 ENCOUNTER — Ambulatory Visit (INDEPENDENT_AMBULATORY_CARE_PROVIDER_SITE_OTHER): Payer: Medicare Other | Admitting: Neurology

## 2016-06-09 VITALS — BP 142/80 | HR 81 | Ht 66.0 in | Wt 150.0 lb

## 2016-06-09 DIAGNOSIS — R270 Ataxia, unspecified: Secondary | ICD-10-CM | POA: Diagnosis not present

## 2016-06-09 DIAGNOSIS — Z86018 Personal history of other benign neoplasm: Secondary | ICD-10-CM | POA: Diagnosis not present

## 2016-06-09 DIAGNOSIS — G959 Disease of spinal cord, unspecified: Secondary | ICD-10-CM

## 2016-06-09 DIAGNOSIS — M5416 Radiculopathy, lumbar region: Secondary | ICD-10-CM | POA: Diagnosis not present

## 2016-06-09 DIAGNOSIS — E538 Deficiency of other specified B group vitamins: Secondary | ICD-10-CM

## 2016-06-09 DIAGNOSIS — R739 Hyperglycemia, unspecified: Secondary | ICD-10-CM

## 2016-06-09 NOTE — Progress Notes (Signed)
Emily Sweeney was seen today in neurologic consultation at the request of Binnie Rail, MD.  The consultation is for the evaluation of gait and balance change.  I have reviewed records from Dr. Quay Burow.  The patient does have a history of chronic right lumbosacral radiculopathy and reports balance issues ever since an acoustic neuroma, s/p resection in 2009.  States that the acoustic neuroma was found after she had balance issues and sinus issues and it was resected in [place] in 2009.  Balance was better after the surgery but never completely normal.   However, she states that balance issues have gotten worse over the last 3 months.  She went to [place] to see her grandkids and sat on ground for a performance and noted significant trouble getting off of the ground.  Very little vertigo with it.  Mostly off balance.  Does state that she is having pain down the right leg.  Last imaging of the lumbar spine was in 2014 and last MRI brain was in 12/2013.  She has had no recent falls.  Some paresthesias of both legs.  No DM.    MRI of the lumbar spine was done on 01/16/2013 demonstrating spondylosis at the L3-L4 region and moderate central canal stenosis at this level.  MRI of the brain was done in 01/06/2014 and I reviewed this.  There was postop changes from resection of the prior acoustic neuroma on the right.  There were a few, rare scattered T2 hyperintensities.    ALLERGIES:   Allergies  Allergen Reactions  . Ceftin [Cefuroxime Axetil] Hives and Rash  . Ciprofloxacin Rash  . Penicillins Hives and Rash    Has patient had a PCN reaction causing immediate rash, facial/tongue/throat swelling, SOB or lightheadedness with hypotension: No Has patient had a PCN reaction causing severe rash involving mucus membranes or skin necrosis: Fayrene Fearing Has patient had a PCN reaction that required hospitalization No Has patient had a PCN reaction occurring within the last 10 years: No If all of the above answers are  "NO", then may proceed with Cephalosporin use.   . Sulfonamide Derivatives Hives and Rash    CURRENT MEDICATIONS:  Outpatient Encounter Prescriptions as of 06/09/2016  Medication Sig  . aspirin EC 81 MG tablet Take 1 tablet (81 mg total) by mouth daily.  Marland Kitchen atorvastatin (LIPITOR) 20 MG tablet TAKE 1 TABLET ONCE DAILY.  . BYSTOLIC 5 MG tablet TAKE 1 TABLET ONCE DAILY.  . calcium carbonate (TUMS - DOSED IN MG ELEMENTAL CALCIUM) 500 MG chewable tablet Chew 1 tablet by mouth daily.  . cyanocobalamin (,VITAMIN B-12,) 1000 MCG/ML injection Inject 1 mL (1,000 mcg total) into the muscle once.  . naproxen sodium (ANAPROX) 220 MG tablet Take 220 mg by mouth 2 (two) times daily with a meal.  . polyvinyl alcohol (LIQUIFILM TEARS) 1.4 % ophthalmic solution Place 1 drop into both eyes daily as needed for dry eyes.  . ramipril (ALTACE) 10 MG capsule TAKE (1) CAPSULE DAILY.  Marland Kitchen VITAMIN D, CHOLECALCIFEROL, PO Take 5,000 Units by mouth daily.    No facility-administered encounter medications on file as of 06/09/2016.     PAST MEDICAL HISTORY:   Past Medical History:  Diagnosis Date  . Anemia   . Blood transfusion without reported diagnosis   . Diverticulosis of colon   . GERD (gastroesophageal reflux disease)   . Hearing loss    right ear, and tinnitus  . Hyperlipidemia   . Hypertension   . Tubular adenoma 03/05/2015  Polyp, and Benign lymphoid polyp.    PAST SURGICAL HISTORY:   Past Surgical History:  Procedure Laterality Date  . APPENDECTOMY    . BREAST BIOPSY     benign  . BREAST LUMPECTOMY    . COCHLEAR IMPLANT    . CRANIECTOMY FOR EXCISION OF ACOUSTIC NEUROMA  2009  . NASAL SEPTUM SURGERY    . TONSILLECTOMY      SOCIAL HISTORY:   Social History   Social History  . Marital status: Divorced    Spouse name: N/A  . Number of children: N/A  . Years of education: N/A   Occupational History  . psychologist    Social History Main Topics  . Smoking status: Former Smoker     Quit date: 07/04/1991  . Smokeless tobacco: Never Used  . Alcohol use 0.0 oz/week     Comment: glass wine 4-5 days a week  . Drug use: No  . Sexual activity: Not Currently   Other Topics Concern  . Not on file   Social History Narrative  . No narrative on file    FAMILY HISTORY:   Family Status  Relation Status  . Father Deceased at age 54  . Mother Deceased at age 24  . Brother Deceased  . Daughter Alive  . Neg Hx     ROS:  No lateralizing weakness.  Occasional diplopia and talked with eye doctor and w/u negative.  No swallowing issues.  A complete 10 system review of systems was obtained and was unremarkable apart from what is mentioned above.  PHYSICAL EXAMINATION:    VITALS:   Vitals:   06/09/16 1428  BP: (!) 142/80  Pulse: 81  Weight: 150 lb (68 kg)  Height: 5\' 6"  (1.676 m)    GEN:  Normal appears female in no acute distress.  Appears stated age. HEENT:  Normocephalic, atraumatic. The mucous membranes are moist. The superficial temporal arteries are without ropiness or tenderness. Cardiovascular: Regular rate and rhythm. Lungs: Clear to auscultation bilaterally. Neck/Heme: There are no carotid bruits noted bilaterally.  NEUROLOGICAL: Orientation:  The patient is alert and oriented x 3.  Fund of knowledge is appropriate.  Recent and remote memory intact.  Attention span and concentration normal.  Repeats and names without difficulty. Cranial nerves: There is good facial symmetry. The pupils are equal round and reactive to light bilaterally. Fundoscopic exam reveals clear disc margins bilaterally. Extraocular muscles are intact and visual fields are full to confrontational testing. Speech is fluent and clear. Soft palate rises symmetrically and there is no tongue deviation. Hearing is intact to conversational tone. Tone: Tone is good throughout. Sensation: Sensation is intact to light touch and pinprick throughout (facial, trunk, extremities). Vibration is intact at  the bilateral big toe but slightly decreased compared to the arms.   There is no extinction with double simultaneous stimulation. There is no sensory dermatomal level identified. Coordination:  The patient has no difficulty with RAM's or FNF bilaterally. Motor: Strength is 5/5 in the bilateral upper and left lower extremities.  Strength is only 3+-4-/5 in the hip flexors on the right but all other mm of the RLE have 5/5 strength.  Shoulder shrug is equal and symmetric. There is no pronator drift.  There are no fasciculations noted. DTR's: Deep tendon reflexes are 2-/4 at the bilateral biceps, triceps, brachioradialis,2/4 at the bilateral patella and trace at the bilateral achilles.  Plantar responses are downgoing bilaterally. Gait and Station: The patient is able to ambulate without difficulty.  However, she cannot ambulate in a tandem fashion.  She can stand in the Romberg position with eyes open, but has significant difficulty with eyes closed.  Labs    Chemistry      Component Value Date/Time   NA 138 10/14/2015 1144   K 4.7 10/14/2015 1144   CL 104 10/14/2015 1144   CO2 26 10/14/2015 1144   BUN 17 10/14/2015 1144   CREATININE 0.86 10/14/2015 1144      Component Value Date/Time   CALCIUM 10.3 10/14/2015 1144   ALKPHOS 82 10/16/2014 1524   AST 17 10/16/2014 1524   ALT 10 10/16/2014 1524   BILITOT 0.6 10/16/2014 1524     Lab Results  Component Value Date   VITAMINB12 200 (L) 10/14/2015   Lab Results  Component Value Date   HGBA1C 6.4 10/14/2015      IMPRESSION/PLAN  1. Gait instability  -I do suspect that this is multifactorial.  She does have evidence of an L3 radiculopathy.  She has weakness of right hip flexors.  She has potential mild evidence of peripheral neuropathy, although I'm not convinced this is a big issue.  She obviously has a history of an acoustic neuroma and balance was never normal after this was removed.  -We will do an MRI of the lumbar spine.  -We will  do an MRI of the brain.  -We will do an EMG.  2.  b12 deficiency  -on injections now.  B12 injection last done on 10/19  -We'll do an MRI of the cervical spine to rule out subacute combined degeneration, although I am not convinced that this is the case.  -I will recheck her B12 neck time before injections.  I will also check her hemoglobin A1c, homocystine and methylmalonic acid.  If EMG demonstrates a true peripheral neuropathy, she may need additional lab work.  3.  We'll determine follow-up based on the above.   Cc:  Binnie Rail, MD

## 2016-06-09 NOTE — Patient Instructions (Signed)
1. Have labs drawn at your primary care providers office the day before your next B12 injection. (see lab requisition slip)  2. We have sent a referral to [facility] for your MRI and they will call you directly to schedule your appt. They are located at [place]. If you need to contact them directly please call [phone].  3. We will schedule EMG at our office.

## 2016-06-13 ENCOUNTER — Ambulatory Visit: Payer: Medicare Other | Attending: Internal Medicine | Admitting: Physical Therapy

## 2016-06-13 ENCOUNTER — Ambulatory Visit (INDEPENDENT_AMBULATORY_CARE_PROVIDER_SITE_OTHER): Payer: Medicare Other | Admitting: Neurology

## 2016-06-13 ENCOUNTER — Other Ambulatory Visit: Payer: Self-pay

## 2016-06-13 DIAGNOSIS — M5440 Lumbago with sciatica, unspecified side: Secondary | ICD-10-CM | POA: Insufficient documentation

## 2016-06-13 DIAGNOSIS — R270 Ataxia, unspecified: Secondary | ICD-10-CM

## 2016-06-13 DIAGNOSIS — R293 Abnormal posture: Secondary | ICD-10-CM

## 2016-06-13 DIAGNOSIS — G8929 Other chronic pain: Secondary | ICD-10-CM

## 2016-06-13 DIAGNOSIS — M6281 Muscle weakness (generalized): Secondary | ICD-10-CM | POA: Insufficient documentation

## 2016-06-13 DIAGNOSIS — M5416 Radiculopathy, lumbar region: Secondary | ICD-10-CM

## 2016-06-13 DIAGNOSIS — R2689 Other abnormalities of gait and mobility: Secondary | ICD-10-CM | POA: Diagnosis not present

## 2016-06-13 NOTE — Procedures (Signed)
[facility]  [address]  [place], [place] [address] Tel: [phone] Fax:  [phone] Test Date:  06/13/2016  Patient: Emily Sweeney DOB: April 28, 1941 Physician: Narda Amber, DO  Sex: Female Height: 5\' 6"  Ref Phys: Alonza Bogus, D.O.  ID#: [id number] Temp: 32.6C Technician: Jerilynn Mages. Dean   Patient Complaints: This is a 75 year old female referred for evaluation of right leg weakness, pain, and gait unsteadiness.   NCV & EMG Findings: Extensive electrodiagnostic testing of the right lower extremity and additional studies of the left shows:  1. Right sural and superficial peroneal sensory responses are within normal limits. 2. Right peroneal and tibial motor responses are within normal limits. 3. Bilateral tibial H reflex studies are within normal limits. 4. Chronic motor axon loss changes are seen affecting the rectus femoris, abductor longus, and tibialis anterior muscles on the right. There is no evidence of accompanied active denervation.  Impression: 1. Chronic L3-L4 radiculopathy affecting the right lower extremity; moderate-to-severe in degree electrically 2. There is no evidence of a sensorimotor polyneuropathy affecting the right lower extremity.   ___________________________ Narda Amber, DO    Nerve Conduction Studies Anti Sensory Summary Table   Site NR Peak (ms) Norm Peak (ms) P-T Amp (V) Norm P-T Amp  Right Sup Peroneal Anti Sensory (Ant Lat Mall)  12 cm    2.9 <4.6 7.9 >3  Right Sural Anti Sensory (Lat Mall)  Calf    3.2 <4.6 6.6 >3   Motor Summary Table   Site NR Onset (ms) Norm Onset (ms) O-P Amp (mV) Norm O-P Amp Site1 Site2 Delta-0 (ms) Dist (cm) Vel (m/s) Norm Vel (m/s)  Right Peroneal Motor (Ext Dig Brev)  Ankle    3.7 <6.0 2.9 >2.5 B Fib Ankle 6.1 31.0 51 >40  B Fib    9.8  2.6  Poplt B Fib 1.8 10.0 56 >40  Poplt    11.6  2.6         Right Tibial Motor (Abd Hall Brev)  Ankle    3.2 <6.0 5.5 >4 Knee Ankle 9.1 38.0 42 >40    Knee    12.3  4.1          H Reflex Studies   NR H-Lat (ms) Lat Norm (ms) L-R H-Lat (ms) M-Lat (ms) HLat-MLat (ms)  Left Tibial (Gastroc)     33.06 <35 1.63 3.54 29.52  Right Tibial (Gastroc)     31.43 <35 1.63 3.54 27.89   EMG   Side Muscle Ins Act Fibs Psw Fasc Number Recrt Dur Dur. Amp Amp. Poly Poly. Comment  Right AntTibialis Nml Nml Nml Nml 1- Rapid Some 1+ Some 1+ Some 1+ N/A  Right Gastroc Nml Nml Nml Nml Nml Nml Nml Nml Nml Nml Nml Nml N/A  Right Flex Dig Long Nml Nml Nml Nml Nml Nml Nml Nml Nml Nml Nml Nml N/A  Right RectFemoris Nml Nml Nml Nml 2- Rapid Most 1+ Most 1+ Many 1+ ATR  Right GluteusMed Nml Nml Nml Nml Nml Nml Nml Nml Nml Nml Nml Nml N/A  Right BicepsFemS Nml Nml Nml Nml Nml Nml Nml Nml Nml Nml Nml Nml N/A  Right Iliacus Nml Nml Nml Nml Nml Nml Nml Nml Nml Nml Nml Nml N/A  Right AdductorLong Nml Nml Nml Nml 2- Rapid Some 1+ Some 1+ Some 1+ N/A  Left RectFemoris Nml Nml Nml Nml Nml Nml Nml Nml Nml Nml Nml Nml N/A      Waveforms:

## 2016-06-13 NOTE — Therapy (Signed)
[place], [place], [address] Phone: [phone]   Fax:  [phone]  Physical Therapy Evaluation  Patient Details  Name: Emily Sweeney MRN: [id number] Date of Birth: 09/05/1940 Referring Provider: Dr. Alfonso Patten. Tat  Encounter Date: 06/13/2016      PT End of Session - 06/13/16 0848    Visit Number 1   Number of Visits 16   Date for PT Re-Evaluation 08/08/16   PT Start Time 0807   PT Stop Time 0858   PT Time Calculation (min) 51 min   Activity Tolerance Patient tolerated treatment well   Behavior During Therapy [facility] for tasks assessed/performed      Past Medical History:  Diagnosis Date  . Anemia   . Blood transfusion without reported diagnosis   . Diverticulosis of colon   . GERD (gastroesophageal reflux disease)   . Hearing loss    right ear, and tinnitus  . Hyperlipidemia   . Hypertension   . Tubular adenoma 03/05/2015   Polyp, and Benign lymphoid polyp.    Past Surgical History:  Procedure Laterality Date  . APPENDECTOMY    . BREAST BIOPSY     benign  . BREAST LUMPECTOMY    . COCHLEAR IMPLANT    . CRANIECTOMY FOR EXCISION OF ACOUSTIC NEUROMA  2009  . NASAL SEPTUM SURGERY    . TONSILLECTOMY      There were no vitals filed for this visit.       Subjective Assessment - 06/13/16 0812    Subjective Pt first began to have pain in back and Rt. leg pain in 2014. Pain improved with PT, ESD.  At that time she reports her pain was constant and debilitating.  She recovered eventually, but now has intermittent Rt. sided back and prox thigh pain that occ radiates to Lt LE.  She reports balance issues, gait instability and decrease in activity.   She c/o weakness in Rt. LE and it gives out at times.  She has to be careful with stairs.  Denies sensory sx.  Her pain prevents her from standing, walking long distances.     Pertinent History lumbar spondylosis, osteopenia   Limitations  Standing;Walking;House hold activities   How long can you stand comfortably? depends, can be less than 15 min at times , today pain increased after just 3 min of standing   How long can you walk comfortably? walks the dog daily, up to several blocks at a time.    Diagnostic tests EMG today   Patient Stated Goals Pt would like to be able to improve her balance and walk with confidence    Currently in Pain? Yes   Pain Score 4   moderate , none at rest   Pain Location Back   Pain Orientation Right   Pain Descriptors / Indicators Aching   Pain Type Chronic pain   Pain Radiating Towards Rt thigh lateral    Pain Onset More than a month ago   Pain Frequency Intermittent   Aggravating Factors  standing, walking   Pain Relieving Factors exercise, back brace, PT, heat    Effect of Pain on Daily Activities limits her normal work and home activities            [facility] PT Assessment - 06/13/16 0818      Assessment   Medical Diagnosis back pain and balance    Referring Provider Dr. Alfonso Patten. Tat   Onset Date/Surgical Date --  chronic  Prior Therapy yes, Dr. Alfonso Ramus      Precautions   Precautions Other (comment)     Restrictions   Weight Bearing Restrictions No     Balance Screen   Has the patient fallen in the past 6 months Yes   How many times? 1, controlled fall    Has the patient had a decrease in activity level because of a fear of falling?  Yes   Is the patient reluctant to leave their home because of a fear of falling?  No     Home Environment   Living Environment Private residence   Living Arrangements Alone   Type of [place] to enter   Entrance Stairs-Number of Steps 12   Entrance Stairs-Rails Right   Home Layout Two level     Prior Function   Level of Independence Independent;Needs assistance with homemaking   Vocation Full time employment   Vocation Requirements psychologist   Leisure family, reading      Cognition   Overall Cognitive Status  Within Functional Limits for tasks assessed     Observation/Other Assessments   Focus on Therapeutic Outcomes (FOTO)  55%     Sensation   Light Touch Appears Intact     Posture/Postural Control   Posture/Postural Control Postural limitations   Postural Limitations Rounded Shoulders;Forward head;Right pelvic obliquity   Posture Comments wide BOS     AROM   Lumbar Flexion WNL in sitting    Lumbar Extension 50% or more    Lumbar - Right Rotation 25%   Lumbar - Left Rotation 25%     Strength   Right Hip Flexion 3+/5   Left Hip Flexion 3+/5   Right Knee Flexion 3+/5   Right Knee Extension 4+/5   Left Knee Flexion 5/5   Left Knee Extension 5/5   Right Ankle Dorsiflexion 4+/5   Left Ankle Dorsiflexion 4+/5     Ambulation/Gait   Ambulation/Gait Yes   Ambulation/Gait Assistance 6: Modified independent (Device/Increase time)   Ambulation Distance (Feet) 75 Feet   Assistive device None   Gait Pattern Right flexed knee in stance;Left flexed knee in stance;Ataxic;Trunk rotated posteriorly on left;Wide base of support   Ambulation Surface Level;Indoor     Berg Balance Test   Sit to Stand Able to stand  independently using hands   Standing Unsupported Able to stand safely 2 minutes   Sitting with Back Unsupported but Feet Supported on Floor or Stool Able to sit safely and securely 2 minutes   Stand to Sit Controls descent by using hands   Transfers Able to transfer safely, definite need of hands   Standing Unsupported with Eyes Closed Unable to keep eyes closed 3 seconds but stays steady   Standing Ubsupported with Feet Together Able to place feet together independently and stand for 1 minute with supervision   From Standing, Reach Forward with Outstretched Arm Can reach confidently >25 cm (10")   From Standing Position, Pick up Object from Floor Able to pick up shoe safely and easily   From Standing Position, Turn to Look Behind Over each Shoulder Looks behind from both sides and  weight shifts well   Turn 360 Degrees Able to turn 360 degrees safely one side only in 4 seconds or less   Standing Unsupported, Alternately Place Feet on Step/Stool Able to complete >2 steps/needs minimal assist   Standing Unsupported, One Foot in Front Able to take small step independently and hold 30 seconds  Standing on One Leg Tries to lift leg/unable to hold 3 seconds but remains standing independently   Total Score 40   Berg comment: does not use cane                            PT Education - 06/13/16 1013    Education provided Yes   Education Details HEP, balance, Berg result, cane , strength    Person(s) Educated Patient   Methods Explanation;Demonstration;Handout   Comprehension Verbalized understanding;Returned demonstration;Need further instruction          PT Short Term Goals - 06/13/16 1352      PT SHORT TERM GOAL #1   Title Pt will be I with HEP    Time 4   Period Weeks   Status New     PT SHORT TERM GOAL #2   Title Pt will be able to sit to stand x 5 from chair in gym within 30 sec    Time 4   Period Weeks   Status New           PT Long Term Goals - 06/13/16 1847      PT LONG TERM GOAL #1   Title Pt will score less than 40% on FOTO   Time 8   Period Weeks   Status New     PT LONG TERM GOAL #2   Title Berg balance score will improve to 46/56 or more to demo decreased fall risk    Time 8   Period Weeks   Status New     PT LONG TERM GOAL #3   Title Pt will score 4+/5 or more in bilateral LEs for improved transfers, gait stability.   Time 8   Period Weeks   Status New     PT LONG TERM GOAL #4   Title Pt will be able to stand, walk in the community for 30 min with min pain increase, relieved by rest.    Time 8   Period Weeks   Status New     PT LONG TERM GOAL #5   Title DGI, other balance and gait speed testing TBA   Time 8   Period Weeks   Status New               Plan - 06/13/16 1116    Clinical  Impression Statement Pt with mod complexity evaluation for chronic low back pain radiating to Rt lateral proximal LE.  Symptoms are consistent with spinal stenosis.  She presents with ataxic gait, wide BOS and decreased balance/confidence. Berg score was 40/56 and she could benefit from cane at least in the community.  She will benefit from PT here at Ortho and if balance issues persist, may transfer to Neuro.    Rehab Potential Excellent   PT Frequency 2x / week   PT Duration 8 weeks   PT Treatment/Interventions ADLs/Self Care Home Management;Electrical Stimulation;Functional mobility training;Patient/family education;Gait training;Stair training;Balance training;Manual techniques;Neuromuscular re-education;Ultrasound;Moist Heat;Therapeutic activities;Therapeutic exercise;DME Instruction   PT Next Visit Plan establish HEP, simple balance ex , modalities if needed    PT Home Exercise Plan none given    Recommended Other Services possibly Neuro    Consulted and Agree with Plan of Care Patient      Patient will benefit from skilled therapeutic intervention in order to improve the following deficits and impairments:  Abnormal gait, Decreased coordination, Decreased range of motion, Difficulty walking, Increased fascial restricitons,  Pain, Decreased balance, Decreased mobility, Decreased strength, Postural dysfunction  Visit Diagnosis: Other abnormalities of gait and mobility  Chronic right-sided low back pain with sciatica, sciatica laterality unspecified  Muscle weakness (generalized)  Abnormal posture      G-Codes - 2016-06-15 1855    Functional Assessment Tool Used FOTO   Functional Limitation Mobility: Walking and moving around   Mobility: Walking and Moving Around Current Status [id number]) At least 40 percent but less than 60 percent impaired, limited or restricted   Mobility: Walking and Moving Around Goal Status [phone]) At least 20 percent but less than 40 percent impaired, limited or  restricted       Problem List Patient Active Problem List   Diagnosis Date Noted  . Poor balance 05/24/2016  . At high risk for falls 05/24/2016  . Lumbosacral spondylosis without myelopathy 01/19/2016  . S/P excision of acoustic neuroma 01/19/2016  . Pruritus 01/19/2016  . Abdominal gas pain 01/19/2016  . B12 deficiency 10/17/2015  . Pernicious anemia 06/09/2015  . PVC (premature ventricular contraction) 02/25/2015  . Osteopenia, senile 01/12/2015  . Vitamin D deficiency 01/12/2015  . Nonspecific abnormal electrocardiogram (ECG) (EKG) 10/16/2014  . Hyperglycemia 12/18/2011  . DIVERTICULITIS OF COLON 10/11/2009  . HYPERCHOLESTEROLEMIA, PURE 10/21/2008  . Essential hypertension 10/21/2008    Persia Lintner 2016/06/15, 6:57 PM  [facility] [address] [place], [place], [address] Phone: [phone]   Fax:  [phone]  Name: Cieria Bordeaux MRN: [id number] Date of Birth: [birth year]  Raeford Razor, PT 2016-06-15 6:57 PM Phone: [phone] Fax: [phone]

## 2016-06-17 ENCOUNTER — Encounter: Payer: Self-pay | Admitting: Internal Medicine

## 2016-06-17 DIAGNOSIS — M5416 Radiculopathy, lumbar region: Secondary | ICD-10-CM | POA: Insufficient documentation

## 2016-06-20 ENCOUNTER — Ambulatory Visit: Payer: Medicare Other | Attending: Internal Medicine | Admitting: Physical Therapy

## 2016-06-20 DIAGNOSIS — M5440 Lumbago with sciatica, unspecified side: Secondary | ICD-10-CM | POA: Insufficient documentation

## 2016-06-20 DIAGNOSIS — G8929 Other chronic pain: Secondary | ICD-10-CM | POA: Diagnosis not present

## 2016-06-20 DIAGNOSIS — R2689 Other abnormalities of gait and mobility: Secondary | ICD-10-CM | POA: Insufficient documentation

## 2016-06-20 DIAGNOSIS — R293 Abnormal posture: Secondary | ICD-10-CM

## 2016-06-20 DIAGNOSIS — M6281 Muscle weakness (generalized): Secondary | ICD-10-CM | POA: Insufficient documentation

## 2016-06-20 NOTE — Patient Instructions (Addendum)
Balance exercises  Stand in corner  1.  Stand with feet together  2. Stand with feet together and move arms up,  Write your name in big letters.  3. Shift weight from foot to foot, legs shoulder width apart.  4. Take a small step with one foot,  Shift weight from foot to foot.   1 to 2 x a day 10 X each  Moves should be slow and steady

## 2016-06-20 NOTE — Therapy (Signed)
[place], [place], [address] Phone: [phone]   Fax:  [phone]  Physical Therapy Treatment  Patient Details  Name: Emily Sweeney MRN: [id number] Date of Birth: August 23, 1940 Referring Provider: Dr. Alfonso Patten. Tat  Encounter Date: 06/20/2016      PT End of Session - 06/20/16 0911    Visit Number 2   Number of Visits 16   Date for PT Re-Evaluation 08/08/16   PT Start Time 0803   PT Stop Time 0845   PT Time Calculation (min) 42 min   Activity Tolerance Patient tolerated treatment well   Behavior During Therapy [facility] for tasks assessed/performed      Past Medical History:  Diagnosis Date  . Anemia   . Blood transfusion without reported diagnosis   . Diverticulosis of colon   . GERD (gastroesophageal reflux disease)   . Hearing loss    right ear, and tinnitus  . Hyperlipidemia   . Hypertension   . Tubular adenoma 03/05/2015   Polyp, and Benign lymphoid polyp.    Past Surgical History:  Procedure Laterality Date  . APPENDECTOMY    . BREAST BIOPSY     benign  . BREAST LUMPECTOMY    . COCHLEAR IMPLANT    . CRANIECTOMY FOR EXCISION OF ACOUSTIC NEUROMA  2009  . NASAL SEPTUM SURGERY    . TONSILLECTOMY      There were no vitals filed for this visit.      Subjective Assessment - 06/20/16 0814    Subjective I have felt better the last few days.  I slept better.  I am worried about my balance I want to work on that.   Currently in Pain? No/denies   Pain Score --  up to 4/10   Pain Location Back   Pain Orientation Right;Left   Pain Descriptors / Indicators Aching;Shooting   Pain Radiating Towards thigh into shin,   Pain Onset More than a month ago   Pain Frequency Intermittent   Aggravating Factors  standing walking   Pain Relieving Factors exercise,  wait until it goes   Effect of Pain on Daily Activities Limits activity                         [facility] Adult PT  Treatment/Exercise - 06/20/16 0001      High Level Balance   High Level Balance Comments In corner static and dynamic exercises added to home exercises.  static stand , feet shoulder width apart, weight shifts side to side and with a small step , narrow base stand and hold,  narrow base with arm reach, and with printing name in bigger letters.  SBA and cues for technique, HEP     Lumbar Exercises: Supine   Ab Set 10 reps   AB Set Limitations cues for breathing   Clam 10 reps   Clam Limitations cues for technique,  unsteady pelvis,  improved some with cues,  still difficult.   Bridge 10 reps     Knee/Hip Exercises: Standing   Heel Raises 1 second   Heel Raises Limitations 10 reps instructions   Terminal Knee Extension 1 set;10 reps  yellow ball press into wall each,  instruction   Lateral Step Up Both;1 set;10 reps;Hand Hold: 2;Step Height: 4"   Lateral Step Up Limitations Cues, some brief hip pain noted,  unclear if it was from exercise or nerve pain   Forward Step Up Both;1 set;10 reps;Hand  Hold: 2;Step Height: 4"   Forward Step Up Limitations 6 inch increased pain  noticed instability knee endrange   Gait Training with mirror to increase trunk rotation to assist balance.  gait improved multiple cues.    Other Standing Knee Exercises mini squat at sink  10 x with instruction to have equal weight shifts     Ankle Exercises: Stretches   Gastroc Stretch 3 reps;30 seconds   Gastroc Stretch Limitations Both                PT Education - 06/20/16 0901    Education provided Yes   Education Details Exercise for balance   Person(s) Educated Patient   Methods Explanation;Demonstration;Verbal cues;Handout   Comprehension Verbalized understanding;Returned demonstration          PT Short Term Goals - 06/20/16 0916      PT SHORT TERM GOAL #1   Title Pt will be I with HEP    Baseline issued today   Time 4   Period Weeks   Status On-going     PT SHORT TERM GOAL #2    Title Pt will be able to sit to stand x 5 from chair in gym within 30 sec    Time 4   Period Weeks   Status Unable to assess           PT Long Term Goals - 06/13/16 1847      PT LONG TERM GOAL #1   Title Pt will score less than 40% on FOTO   Time 8   Period Weeks   Status New     PT LONG TERM GOAL #2   Title Berg balance score will improve to 46/56 or more to demo decreased fall risk    Time 8   Period Weeks   Status New     PT LONG TERM GOAL #3   Title Pt will score 4+/5 or more in bilateral LEs for improved transfers, gait stability.   Time 8   Period Weeks   Status New     PT LONG TERM GOAL #4   Title Pt will be able to stand, walk in the community for 30 min with min pain increase, relieved by rest.    Time 8   Period Weeks   Status New     PT LONG TERM GOAL #5   Title DGI, other balance and gait speed testing TBA   Time 8   Period Weeks   Status New               Plan - 06/20/16 0912    Clinical Impression Statement progress toward home exercise goal.  Pain into shins to foot post session.( mild) Patient thinks some of her balance issues stem from a tumor removal near her ear and issues which never fully recovered.   PT Next Visit Plan Build  HEP, simple balance ex , modalities if needed Review corner balance exercises, check gait.    PT Home Exercise Plan corner balance exercises   Consulted and Agree with Plan of Care Patient      Patient will benefit from skilled therapeutic intervention in order to improve the following deficits and impairments:  Abnormal gait, Decreased coordination, Decreased range of motion, Difficulty walking, Increased fascial restricitons, Pain, Decreased balance, Decreased mobility, Decreased strength, Postural dysfunction  Visit Diagnosis: Other abnormalities of gait and mobility  Chronic right-sided low back pain with sciatica, sciatica laterality unspecified  Muscle weakness (generalized)  Abnormal  posture     Problem List Patient Active Problem List   Diagnosis Date Noted  . Chronic lumbar radiculopathy 06/17/2016  . Poor balance 05/24/2016  . At high risk for falls 05/24/2016  . Lumbosacral spondylosis without myelopathy 01/19/2016  . S/P excision of acoustic neuroma 01/19/2016  . Pruritus 01/19/2016  . Abdominal gas pain 01/19/2016  . B12 deficiency 10/17/2015  . Pernicious anemia 06/09/2015  . PVC (premature ventricular contraction) 02/25/2015  . Osteopenia, senile 01/12/2015  . Vitamin D deficiency 01/12/2015  . Nonspecific abnormal electrocardiogram (ECG) (EKG) 10/16/2014  . Hyperglycemia 12/18/2011  . DIVERTICULITIS OF COLON 10/11/2009  . HYPERCHOLESTEROLEMIA, PURE 10/21/2008  . Essential hypertension 10/21/2008    Troyce Gieske PTA 06/20/2016, 9:17 AM  [facility] [address] [place], [place], [address] Phone: [phone]   Fax:  [phone]  Name: Lourie Kovatch MRN: [id number] Date of Birth: February 28, 1941

## 2016-06-21 ENCOUNTER — Telehealth: Payer: Self-pay | Admitting: Neurology

## 2016-06-21 ENCOUNTER — Ambulatory Visit
Admission: RE | Admit: 2016-06-21 | Discharge: 2016-06-21 | Disposition: A | Discharge status: Home / Self Care | Payer: Medicare Other | Source: Ambulatory Visit | Attending: Neurology | Admitting: Neurology

## 2016-06-21 DIAGNOSIS — R270 Ataxia, unspecified: Secondary | ICD-10-CM

## 2016-06-21 DIAGNOSIS — R42 Dizziness and giddiness: Secondary | ICD-10-CM | POA: Diagnosis not present

## 2016-06-21 DIAGNOSIS — Z86018 Personal history of other benign neoplasm: Secondary | ICD-10-CM

## 2016-06-21 MED ORDER — GADOBENATE DIMEGLUMINE 529 MG/ML IV SOLN
13.0000 mL | Freq: Once | INTRAVENOUS | Status: AC | PRN
  Administered 2016-06-21: 13 mL via INTRAVENOUS

## 2016-06-21 NOTE — Telephone Encounter (Signed)
Left message on machine for patient to call back.

## 2016-06-21 NOTE — Telephone Encounter (Signed)
-----   Message from [place], DO sent at 06/21/2016 11:50 AM EST ----- Let pt know that MRI brain looks okay.  No new changes

## 2016-06-22 ENCOUNTER — Ambulatory Visit: Payer: Medicare Other | Admitting: Physical Therapy

## 2016-06-22 ENCOUNTER — Ambulatory Visit
Admission: RE | Admit: 2016-06-22 | Discharge: 2016-06-22 | Disposition: A | Discharge status: Home / Self Care | Payer: Medicare Other | Source: Ambulatory Visit | Attending: Neurology | Admitting: Neurology

## 2016-06-22 DIAGNOSIS — E538 Deficiency of other specified B group vitamins: Secondary | ICD-10-CM

## 2016-06-22 DIAGNOSIS — M5416 Radiculopathy, lumbar region: Secondary | ICD-10-CM

## 2016-06-22 DIAGNOSIS — R293 Abnormal posture: Secondary | ICD-10-CM | POA: Diagnosis not present

## 2016-06-22 DIAGNOSIS — M48061 Spinal stenosis, lumbar region without neurogenic claudication: Secondary | ICD-10-CM | POA: Diagnosis not present

## 2016-06-22 DIAGNOSIS — R2689 Other abnormalities of gait and mobility: Secondary | ICD-10-CM | POA: Diagnosis not present

## 2016-06-22 DIAGNOSIS — M5440 Lumbago with sciatica, unspecified side: Secondary | ICD-10-CM | POA: Diagnosis not present

## 2016-06-22 DIAGNOSIS — G8929 Other chronic pain: Secondary | ICD-10-CM

## 2016-06-22 DIAGNOSIS — G959 Disease of spinal cord, unspecified: Secondary | ICD-10-CM

## 2016-06-22 DIAGNOSIS — M6281 Muscle weakness (generalized): Secondary | ICD-10-CM

## 2016-06-22 DIAGNOSIS — M47812 Spondylosis without myelopathy or radiculopathy, cervical region: Secondary | ICD-10-CM | POA: Diagnosis not present

## 2016-06-22 DIAGNOSIS — R270 Ataxia, unspecified: Secondary | ICD-10-CM

## 2016-06-22 NOTE — Telephone Encounter (Signed)
Patient was returning your call. Her # [phone]. Thank you

## 2016-06-22 NOTE — Telephone Encounter (Signed)
Patient made aware.

## 2016-06-22 NOTE — Patient Instructions (Signed)
Bridging    Slowly raise buttocks from floor, keeping stomach tight. Repeat __10__ times per set. Do _1___ sets per session. Do _2___ sessions per day.  http://orth.exer.us/1097   Knee to Chest    Lying supine, bend involved knee to chest __3-5_ times. Repeat with other leg. Do _2__ times per day.  Copyright  VHI. All rights reserved.    Lower Trunk Rotation Stretch    Keeping back flat and feet together, rotate knees to left side. Hold __10-20__ seconds. Repeat ___10_ times per set. Do __1__ sets per session. Do _2___ sessions per day.  http://orth.exer.us/123   Copyright  VHI. All rights reserved.    Copyright  VHI. All rights reserved.

## 2016-06-22 NOTE — Therapy (Signed)
[place], [place], [address] Phone: [phone]   Fax:  [phone]  Physical Therapy Treatment  Patient Details  Name: Emily Sweeney MRN: [id number] Date of Birth: 06/19/1941 Referring Provider: Dr. Alfonso Patten. Tat  Encounter Date: 06/22/2016      PT End of Session - 06/22/16 1236    Visit Number 3   Number of Visits 16   Date for PT Re-Evaluation 08/08/16   PT Start Time [id number]   PT Stop Time 0942   PT Time Calculation (min) 48 min   Activity Tolerance Patient tolerated treatment well   Behavior During Therapy [facility] for tasks assessed/performed      Past Medical History:  Diagnosis Date  . Anemia   . Blood transfusion without reported diagnosis   . Diverticulosis of colon   . GERD (gastroesophageal reflux disease)   . Hearing loss    right ear, and tinnitus  . Hyperlipidemia   . Hypertension   . Tubular adenoma 03/05/2015   Polyp, and Benign lymphoid polyp.    Past Surgical History:  Procedure Laterality Date  . APPENDECTOMY    . BREAST BIOPSY     benign  . BREAST LUMPECTOMY    . COCHLEAR IMPLANT    . CRANIECTOMY FOR EXCISION OF ACOUSTIC NEUROMA  2009  . NASAL SEPTUM SURGERY    . TONSILLECTOMY      There were no vitals filed for this visit.      Subjective Assessment - 06/22/16 0855    Subjective I really have had less pain the past few days.  This AM was 2/10 when walking the dog.     Currently in Pain? No/denies                         [facility] Adult PT Treatment/Exercise - 06/22/16 0001      High Level Balance   High Level Balance Activities --  standing on foam, trunk rotation and overhead reaching ,minA   High Level Balance Comments In corner static and dynamic exercise  static stand , feet shoulder width apart, weight shifts side to side and with a small step , narrow base stand and hold,  narrow base with arm reach, and with printing name in bigger letters.  SBA  and cues for technique, HEP     Lumbar Exercises: Stretches   Single Knee to Chest Stretch 2 reps;20 seconds   Lower Trunk Rotation 5 reps;20 seconds   Pelvic Tilt 5 reps;10 seconds     Lumbar Exercises: Supine   Clam 10 reps   Bent Knee Raise 10 reps   Bridge 10 reps     Knee/Hip Exercises: Standing   Heel Raises 2 sets;10 reps   Heel Raises Limitations 1 set in corner facing wall for balance        MHP 10 min supine to lumbar            PT Short Term Goals - 06/20/16 0916      PT SHORT TERM GOAL #1   Title Pt will be I with HEP    Baseline issued today   Time 4   Period Weeks   Status On-going     PT SHORT TERM GOAL #2   Title Pt will be able to sit to stand x 5 from chair in gym within 30 sec    Time 4   Period Weeks   Status Unable to assess  PT Long Term Goals - 06/13/16 1847      PT LONG TERM GOAL #1   Title Pt will score less than 40% on FOTO   Time 8   Period Weeks   Status New     PT LONG TERM GOAL #2   Title Berg balance score will improve to 46/56 or more to demo decreased fall risk    Time 8   Period Weeks   Status New     PT LONG TERM GOAL #3   Title Pt will score 4+/5 or more in bilateral LEs for improved transfers, gait stability.   Time 8   Period Weeks   Status New     PT LONG TERM GOAL #4   Title Pt will be able to stand, walk in the community for 30 min with min pain increase, relieved by rest.    Time 8   Period Weeks   Status New     PT LONG TERM GOAL #5   Title DGI, other balance and gait speed testing TBA   Time 8   Period Weeks   Status New               Plan - 06/22/16 1237    Clinical Impression Statement Pt doing well, noted balance deficits, lack of core control with standing and supine exercises.  Min pain increase with exercises.    PT Next Visit Plan check mat HEP, simple balance ex , modalities if needed, check gait and try DGI    PT Home Exercise Plan corner balance exercises, bridge  and KTC and LTR      Patient will benefit from skilled therapeutic intervention in order to improve the following deficits and impairments:  Abnormal gait, Decreased coordination, Decreased range of motion, Difficulty walking, Increased fascial restricitons, Pain, Decreased balance, Decreased mobility, Decreased strength, Postural dysfunction  Visit Diagnosis: Other abnormalities of gait and mobility  Muscle weakness (generalized)  Chronic right-sided low back pain with sciatica, sciatica laterality unspecified  Abnormal posture     Problem List Patient Active Problem List   Diagnosis Date Noted  . Chronic lumbar radiculopathy 06/17/2016  . Poor balance 05/24/2016  . At high risk for falls 05/24/2016  . Lumbosacral spondylosis without myelopathy 01/19/2016  . S/P excision of acoustic neuroma 01/19/2016  . Pruritus 01/19/2016  . Abdominal gas pain 01/19/2016  . B12 deficiency 10/17/2015  . Pernicious anemia 06/09/2015  . PVC (premature ventricular contraction) 02/25/2015  . Osteopenia, senile 01/12/2015  . Vitamin D deficiency 01/12/2015  . Nonspecific abnormal electrocardiogram (ECG) (EKG) 10/16/2014  . Hyperglycemia 12/18/2011  . DIVERTICULITIS OF COLON 10/11/2009  . HYPERCHOLESTEROLEMIA, PURE 10/21/2008  . Essential hypertension 10/21/2008    Akacia Boltz 06/22/2016, 12:51 PM  [facility] [address] [place], [place], [address] Phone: [phone]   Fax:  [phone]  Name: Minahil Hults MRN: [id number] Date of Birth: 01-27-1941   Raeford Razor, PT 06/22/16 12:51 PM Phone: [phone] Fax: [phone]

## 2016-06-23 ENCOUNTER — Telehealth: Payer: Self-pay | Admitting: Neurology

## 2016-06-23 NOTE — Telephone Encounter (Signed)
Left message on machine for patient to call back.

## 2016-06-23 NOTE — Telephone Encounter (Signed)
Let pt know that MRI cervical spine generally looks okay - some degen changes.  MRI lumbar spine with biggest changes - disc protrusion, nerve root compression at L3-4, which is consistent with her PE and with EMG.  We can talk about this further at a follow up visit if she would like.  Make appt

## 2016-06-23 NOTE — Telephone Encounter (Signed)
Patient made aware. Follow up appt made.  

## 2016-06-26 ENCOUNTER — Telehealth: Payer: Self-pay | Admitting: Neurology

## 2016-06-26 NOTE — Telephone Encounter (Signed)
Called to check with patient and she states she was waiting until just before b12 injection as instructed and will get these drawn around 06-30-16.

## 2016-06-26 NOTE — Telephone Encounter (Signed)
-----   Message from [place], DO sent at 06/26/2016  8:01 AM EST ----- Pt coming in tomorrow.  I never got her labwork back.  I remember that she was going to go to PCP office (I think???)

## 2016-06-26 NOTE — Progress Notes (Signed)
Emily Sweeney was seen today in neurologic consultation at the request of Binnie Rail, MD.  The consultation is for the evaluation of gait and balance change.  I have reviewed records from Dr. Quay Burow.  The patient does have a history of chronic right lumbosacral radiculopathy and reports balance issues ever since an acoustic neuroma, s/p resection in 2009.  States that the acoustic neuroma was found after she had balance issues and sinus issues and it was resected in [place] in 2009.  Balance was better after the surgery but never completely normal.   However, she states that balance issues have gotten worse over the last 3 months.  She went to [place] to see her grandkids and sat on ground for a performance and noted significant trouble getting off of the ground.  Very little vertigo with it.  Mostly off balance.  Does state that she is having pain down the right leg.  Last imaging of the lumbar spine was in 2014 and last MRI brain was in 12/2013.  She has had no recent falls.  Some paresthesias of both legs.  No DM.    MRI of the lumbar spine was done on 01/16/2013 demonstrating spondylosis at the L3-L4 region and moderate central canal stenosis at this level.  MRI of the brain was done in 01/06/2014 and I reviewed this.  There was postop changes from resection of the prior acoustic neuroma on the right.  There were a few, rare scattered T2 hyperintensities.  06/27/16 update:  The patient returns today for follow-up on testing results.  I reviewed her films.  Her MRI of the brain was unremarkable.  It actually showed improvement of the enhancement from her prior surgical removal of the acoustic neuroma.  MRI of the cervical spine showed spondylitic changes.  MRI of the lumbar spine demonstrated degenerative changes, scoliosis and fairly significant neural foraminal stenosis at the L3-L4 level.  Moderate central canal spinal stenosis was noted at this level.  EMG demonstrated a chronic moderate to severe  lumbosacral radiculopathy at the L3-L4 level.  She was supposed to have lab work done but hasn't yet because she was waiting until her next B12 was due (wanted a trough level).   ALLERGIES:   Allergies  Allergen Reactions  . Ceftin [Cefuroxime Axetil] Hives and Rash  . Ciprofloxacin Rash  . Penicillins Hives and Rash    Has patient had a PCN reaction causing immediate rash, facial/tongue/throat swelling, SOB or lightheadedness with hypotension: No Has patient had a PCN reaction causing severe rash involving mucus membranes or skin necrosis: Fayrene Fearing Has patient had a PCN reaction that required hospitalization No Has patient had a PCN reaction occurring within the last 10 years: No If all of the above answers are "NO", then may proceed with Cephalosporin use.   . Sulfonamide Derivatives Hives and Rash    CURRENT MEDICATIONS:  Outpatient Encounter Prescriptions as of 06/27/2016  Medication Sig  . aspirin EC 81 MG tablet Take 1 tablet (81 mg total) by mouth daily.  Marland Kitchen atorvastatin (LIPITOR) 20 MG tablet TAKE 1 TABLET ONCE DAILY.  . BYSTOLIC 5 MG tablet TAKE 1 TABLET ONCE DAILY.  . calcium carbonate (TUMS - DOSED IN MG ELEMENTAL CALCIUM) 500 MG chewable tablet Chew 1 tablet by mouth daily.  . cyanocobalamin (,VITAMIN B-12,) 1000 MCG/ML injection Inject 1 mL (1,000 mcg total) into the muscle once.  . naproxen sodium (ANAPROX) 220 MG tablet Take 220 mg by mouth 2 (two) times daily with a  meal.  . polyvinyl alcohol (LIQUIFILM TEARS) 1.4 % ophthalmic solution Place 1 drop into both eyes daily as needed for dry eyes.  . ramipril (ALTACE) 10 MG capsule TAKE (1) CAPSULE DAILY.  Marland Kitchen VITAMIN D, CHOLECALCIFEROL, PO Take 5,000 Units by mouth daily.    No facility-administered encounter medications on file as of 06/27/2016.     PAST MEDICAL HISTORY:   Past Medical History:  Diagnosis Date  . Anemia   . Blood transfusion without reported diagnosis   . Diverticulosis of colon   . GERD  (gastroesophageal reflux disease)   . Hearing loss    right ear, and tinnitus  . Hyperlipidemia   . Hypertension   . Tubular adenoma 03/05/2015   Polyp, and Benign lymphoid polyp.    PAST SURGICAL HISTORY:   Past Surgical History:  Procedure Laterality Date  . APPENDECTOMY    . BREAST BIOPSY     benign  . BREAST LUMPECTOMY    . COCHLEAR IMPLANT    . CRANIECTOMY FOR EXCISION OF ACOUSTIC NEUROMA  2009  . NASAL SEPTUM SURGERY    . TONSILLECTOMY      SOCIAL HISTORY:   Social History   Social History  . Marital status: Divorced    Spouse name: N/A  . Number of children: N/A  . Years of education: N/A   Occupational History  . psychologist    Social History Main Topics  . Smoking status: Former Smoker    Quit date: 07/04/1991  . Smokeless tobacco: Never Used  . Alcohol use 0.0 oz/week     Comment: glass wine 4-5 days a week  . Drug use: No  . Sexual activity: Not Currently   Other Topics Concern  . Not on file   Social History Narrative  . No narrative on file    FAMILY HISTORY:   Family Status  Relation Status  . Father Deceased at age 73  . Mother Deceased at age 82  . Brother Deceased  . Daughter Alive  . Neg Hx     ROS:  No lateralizing weakness.  Occasional diplopia and talked with eye doctor and w/u negative.  No swallowing issues.  A complete 10 system review of systems was obtained and was unremarkable apart from what is mentioned above.  PHYSICAL EXAMINATION:    VITALS:   There were no vitals filed for this visit.  GEN:  Normal appears female in no acute distress.  Appears stated age. HEENT:  Normocephalic, atraumatic. The mucous membranes are moist. The superficial temporal arteries are without ropiness or tenderness. Cardiovascular: Regular rate and rhythm. Lungs: Clear to auscultation bilaterally. Neck/Heme: There are no carotid bruits noted bilaterally.  NEUROLOGICAL: Orientation:  The patient is alert and oriented x 3.   Cranial  nerves: There is good facial symmetry.  Speech is fluent and clear. Soft palate rises symmetrically and there is no tongue deviation. Hearing is intact to conversational tone. Tone: Tone is good throughout. Sensation: Sensation is intact to light touch throughout. Coordination:  The patient has no difficulty with RAM's or FNF bilaterally. Motor: Strength is 5/5 in the bilateral upper and left lower extremities.  Strength is only 3+-4-/5 in the hip flexors on the right but all other mm of the RLE have 5/5 strength.  Shoulder shrug is equal and symmetric. There is no pronator drift.  There are no fasciculations noted. DTR's: Deep tendon reflexes are 2-/4 at the bilateral biceps, triceps, brachioradialis,2/4 at the bilateral patella and trace at the bilateral  achilles.  Plantar responses are downgoing bilaterally. Gait and Station: The patient is able to ambulate without difficulty.  However, she cannot ambulate in a tandem fashion.  She can stand in the Romberg position with eyes open, but has significant difficulty with eyes closed.  Labs    Chemistry      Component Value Date/Time   NA 138 10/14/2015 1144   K 4.7 10/14/2015 1144   CL 104 10/14/2015 1144   CO2 26 10/14/2015 1144   BUN 17 10/14/2015 1144   CREATININE 0.86 10/14/2015 1144      Component Value Date/Time   CALCIUM 10.3 10/14/2015 1144   ALKPHOS 82 10/16/2014 1524   AST 17 10/16/2014 1524   ALT 10 10/16/2014 1524   BILITOT 0.6 10/16/2014 1524     Lab Results  Component Value Date   VITAMINB12 200 (L) 10/14/2015   Lab Results  Component Value Date   HGBA1C 6.4 10/14/2015      IMPRESSION/PLAN  1. Gait instability  -Based on her testing, I think that most of her issues from weakness of hip flexors, which is from a chronic and continued L3-L4 radiculopathy.  Physical examination, MRI and EMG all support this.He notices chronic, however, she is getting weaker and I think that surgical intervention may help given this.   I will refer her to Dr. Vertell Limber.  2.  b12 deficiency  -on injections now.  B12 injection last done on 10/19  -I will recheck her B12 neck time before injections.  I will also check her hemoglobin A1c, homocystine and methylmalonic acid.  She is scheduled to have this done on Friday.  I will call her with the results.     Cc:  Binnie Rail, MD

## 2016-06-27 ENCOUNTER — Ambulatory Visit (INDEPENDENT_AMBULATORY_CARE_PROVIDER_SITE_OTHER): Payer: Medicare Other | Admitting: Neurology

## 2016-06-27 ENCOUNTER — Encounter: Payer: Self-pay | Admitting: Neurology

## 2016-06-27 VITALS — BP 138/78 | HR 80 | Ht 66.0 in | Wt 147.0 lb

## 2016-06-27 DIAGNOSIS — M5417 Radiculopathy, lumbosacral region: Secondary | ICD-10-CM | POA: Diagnosis not present

## 2016-06-27 DIAGNOSIS — E538 Deficiency of other specified B group vitamins: Secondary | ICD-10-CM

## 2016-06-28 ENCOUNTER — Telehealth: Payer: Self-pay | Admitting: Neurology

## 2016-06-28 ENCOUNTER — Ambulatory Visit: Payer: Medicare Other | Admitting: Physical Therapy

## 2016-06-28 DIAGNOSIS — M6281 Muscle weakness (generalized): Secondary | ICD-10-CM | POA: Diagnosis not present

## 2016-06-28 DIAGNOSIS — R293 Abnormal posture: Secondary | ICD-10-CM

## 2016-06-28 DIAGNOSIS — M5440 Lumbago with sciatica, unspecified side: Secondary | ICD-10-CM | POA: Diagnosis not present

## 2016-06-28 DIAGNOSIS — G8929 Other chronic pain: Secondary | ICD-10-CM

## 2016-06-28 DIAGNOSIS — R2689 Other abnormalities of gait and mobility: Secondary | ICD-10-CM

## 2016-06-28 NOTE — Therapy (Signed)
[place], [place], [address] Phone: [phone]   Fax:  [phone]  Physical Therapy Treatment  Patient Details  Name: Emily Sweeney MRN: [id number] Date of Birth: 10-Sep-1940 Referring Provider: Dr. Alfonso Patten. Tat  Encounter Date: 06/28/2016      PT End of Session - 06/28/16 0812    Visit Number 4   Number of Visits 16   Date for PT Re-Evaluation 08/08/16   PT Start Time 0803   PT Stop Time 0856   PT Time Calculation (min) 53 min   Activity Tolerance Patient tolerated treatment well   Behavior During Therapy [facility] for tasks assessed/performed      Past Medical History:  Diagnosis Date  . Anemia   . Blood transfusion without reported diagnosis   . Diverticulosis of colon   . GERD (gastroesophageal reflux disease)   . Hearing loss    right ear, and tinnitus  . Hyperlipidemia   . Hypertension   . Tubular adenoma 03/05/2015   Polyp, and Benign lymphoid polyp.    Past Surgical History:  Procedure Laterality Date  . APPENDECTOMY    . BREAST BIOPSY     benign  . BREAST LUMPECTOMY    . COCHLEAR IMPLANT    . CRANIECTOMY FOR EXCISION OF ACOUSTIC NEUROMA  2009  . NASAL SEPTUM SURGERY    . TONSILLECTOMY      There were no vitals filed for this visit.      Subjective Assessment - 06/28/16 0810    Subjective Ive been doing well.  Got my MRI results, its my L2-L3 nerve.  Referred to Dr. Vertell Limber (neurosurgeon).     Pain Score 3    Pain Location Back   Pain Orientation Right;Left;Lower   Pain Descriptors / Indicators Aching   Pain Type Chronic pain   Pain Radiating Towards hip    Pain Onset More than a month ago   Pain Frequency Intermittent   Aggravating Factors  standing, walking, overactivity    Pain Relieving Factors stretch, rest   Multiple Pain Sites No            [facility] PT Assessment - 06/28/16 0001      Dynamic Gait Index   Level Surface Mild Impairment   Change in Gait Speed  Moderate Impairment   Gait with Horizontal Head Turns Severe Impairment   Gait with Vertical Head Turns Severe Impairment              [facility] Adult PT Treatment/Exercise - 06/28/16 0001      Lumbar Exercises: Stretches   Active Hamstring Stretch 2 reps;30 seconds   Single Knee to Chest Stretch 2 reps;20 seconds   Single Knee to Chest Stretch Limitations then 2 x knee to opp shoulder    Lower Trunk Rotation 5 reps;20 seconds     Lumbar Exercises: Supine   Clam 10 reps   Clam Limitations unilat with green band    Bridge 10 reps   Bridge Limitations ball squeeze    Straight Leg Raise 10 reps     Lumbar Exercises: Sidelying   Clam 20 reps   Clam Limitations ER and IR each      Knee/Hip Exercises: Standing   Functional Squat 1 set;10 reps   Functional Squat Limitations pain in back poor form    SLS with Vectors hip abd x 15 each    Other Standing Knee Exercises standing rolls on foam roller SLS with knee flex/ext x 15 reps  PT Education - 06/28/16 1241    Education provided Yes   Education Details pain vs strain of muscle, fatigue   Person(s) Educated Patient   Methods Explanation   Comprehension Verbalized understanding          PT Short Term Goals - 06/20/16 0916      PT SHORT TERM GOAL #1   Title Pt will be I with HEP    Baseline issued today   Time 4   Period Weeks   Status On-going     PT SHORT TERM GOAL #2   Title Pt will be able to sit to stand x 5 from chair in gym within 30 sec    Time 4   Period Weeks   Status Unable to assess           PT Long Term Goals - 06/13/16 1847      PT LONG TERM GOAL #1   Title Pt will score less than 40% on FOTO   Time 8   Period Weeks   Status New     PT LONG TERM GOAL #2   Title Berg balance score will improve to 46/56 or more to demo decreased fall risk    Time 8   Period Weeks   Status New     PT LONG TERM GOAL #3   Title Pt will score 4+/5 or more in bilateral LEs for  improved transfers, gait stability.   Time 8   Period Weeks   Status New     PT LONG TERM GOAL #4   Title Pt will be able to stand, walk in the community for 30 min with min pain increase, relieved by rest.    Time 8   Period Weeks   Status New     PT LONG TERM GOAL #5   Title DGI, other balance and gait speed testing TBA   Time 8   Period Weeks   Status New               Plan - 06/28/16 1241    Clinical Impression Statement Started DGI and noted significant deficits with both vertical and horizontal head turns.  She really needs to use an assistive device for gait safety and stability.  Feeling overall less pain especially in LLE which she is pleased with.  Will follow up with patient regarding Dr. Vertell Limber visit.    PT Next Visit Plan check mat HEP, simple balance ex , modalities if needed, check gait and try to finish DGI    PT Home Exercise Plan corner balance exercises, bridge and KTC and LTR   Consulted and Agree with Plan of Care Patient      Patient will benefit from skilled therapeutic intervention in order to improve the following deficits and impairments:  Abnormal gait, Decreased coordination, Decreased range of motion, Difficulty walking, Increased fascial restricitons, Pain, Decreased balance, Decreased mobility, Decreased strength, Postural dysfunction  Visit Diagnosis: Other abnormalities of gait and mobility  Muscle weakness (generalized)  Chronic right-sided low back pain with sciatica, sciatica laterality unspecified  Abnormal posture     Problem List Patient Active Problem List   Diagnosis Date Noted  . Chronic lumbar radiculopathy 06/17/2016  . Poor balance 05/24/2016  . At high risk for falls 05/24/2016  . Lumbosacral spondylosis without myelopathy 01/19/2016  . S/P excision of acoustic neuroma 01/19/2016  . Pruritus 01/19/2016  . Abdominal gas pain 01/19/2016  . B12 deficiency 10/17/2015  . Pernicious anemia 06/09/2015  .  PVC (premature  ventricular contraction) 02/25/2015  . Osteopenia, senile 01/12/2015  . Vitamin D deficiency 01/12/2015  . Nonspecific abnormal electrocardiogram (ECG) (EKG) 10/16/2014  . Hyperglycemia 12/18/2011  . DIVERTICULITIS OF COLON 10/11/2009  . HYPERCHOLESTEROLEMIA, PURE 10/21/2008  . Essential hypertension 10/21/2008    Aviv Rota 06/28/2016, 12:47 PM  [facility] [address] [place], [place], [address] Phone: [phone]   Fax:  [phone]  Name: Emily Sweeney MRN: [id number] Date of Birth: 1941/06/22  Raeford Razor, PT 06/28/16 12:47 PM Phone: [phone] Fax: [phone]

## 2016-06-28 NOTE — Telephone Encounter (Signed)
Referral faxed to [facility] at [phone] with confirmation received. They will contact the patient to schedule.  

## 2016-06-30 ENCOUNTER — Other Ambulatory Visit (INDEPENDENT_AMBULATORY_CARE_PROVIDER_SITE_OTHER): Payer: Medicare Other

## 2016-06-30 ENCOUNTER — Ambulatory Visit: Payer: Medicare Other | Admitting: Physical Therapy

## 2016-06-30 DIAGNOSIS — M6281 Muscle weakness (generalized): Secondary | ICD-10-CM | POA: Diagnosis not present

## 2016-06-30 DIAGNOSIS — E538 Deficiency of other specified B group vitamins: Secondary | ICD-10-CM

## 2016-06-30 DIAGNOSIS — I493 Ventricular premature depolarization: Secondary | ICD-10-CM

## 2016-06-30 DIAGNOSIS — M5440 Lumbago with sciatica, unspecified side: Secondary | ICD-10-CM | POA: Diagnosis not present

## 2016-06-30 DIAGNOSIS — Z9889 Other specified postprocedural states: Secondary | ICD-10-CM

## 2016-06-30 DIAGNOSIS — G8929 Other chronic pain: Secondary | ICD-10-CM | POA: Diagnosis not present

## 2016-06-30 DIAGNOSIS — Z86018 Personal history of other benign neoplasm: Secondary | ICD-10-CM

## 2016-06-30 DIAGNOSIS — R739 Hyperglycemia, unspecified: Secondary | ICD-10-CM

## 2016-06-30 DIAGNOSIS — I10 Essential (primary) hypertension: Secondary | ICD-10-CM | POA: Diagnosis not present

## 2016-06-30 DIAGNOSIS — R2689 Other abnormalities of gait and mobility: Secondary | ICD-10-CM

## 2016-06-30 DIAGNOSIS — M47817 Spondylosis without myelopathy or radiculopathy, lumbosacral region: Secondary | ICD-10-CM | POA: Diagnosis not present

## 2016-06-30 DIAGNOSIS — E7800 Pure hypercholesterolemia, unspecified: Secondary | ICD-10-CM

## 2016-06-30 DIAGNOSIS — R293 Abnormal posture: Secondary | ICD-10-CM

## 2016-06-30 DIAGNOSIS — D510 Vitamin B12 deficiency anemia due to intrinsic factor deficiency: Secondary | ICD-10-CM

## 2016-06-30 LAB — CBC WITH DIFFERENTIAL/PLATELET
BASOS ABS: 0 10*3/uL (ref 0.0–0.1)
BASOS PCT: 0.1 % (ref 0.0–3.0)
EOS ABS: 0 10*3/uL (ref 0.0–0.7)
Eosinophils Relative: 0.4 % (ref 0.0–5.0)
HCT: 28.9 % — ABNORMAL LOW (ref 36.0–46.0)
Hemoglobin: 8.9 g/dL — ABNORMAL LOW (ref 12.0–15.0)
Lymphocytes Relative: 23.2 % (ref 12.0–46.0)
Lymphs Abs: 1.5 10*3/uL (ref 0.7–4.0)
MCHC: 31 g/dL (ref 30.0–36.0)
MCV: 72.4 fl — ABNORMAL LOW (ref 78.0–100.0)
MONO ABS: 0.6 10*3/uL (ref 0.1–1.0)
Monocytes Relative: 9.3 % (ref 3.0–12.0)
NEUTROS ABS: 4.3 10*3/uL (ref 1.4–7.7)
NEUTROS PCT: 67 % (ref 43.0–77.0)
PLATELETS: 264 10*3/uL (ref 150.0–400.0)
RBC: 3.98 Mil/uL (ref 3.87–5.11)
RDW: 18.6 % — AB (ref 11.5–15.5)
WBC: 6.4 10*3/uL (ref 4.0–10.5)

## 2016-06-30 LAB — COMPREHENSIVE METABOLIC PANEL
ALT: 10 U/L (ref 0–35)
AST: 16 U/L (ref 0–37)
Albumin: 4.2 g/dL (ref 3.5–5.2)
Alkaline Phosphatase: 62 U/L (ref 39–117)
BUN: 13 mg/dL (ref 6–23)
CHLORIDE: 102 meq/L (ref 96–112)
CO2: 25 meq/L (ref 19–32)
CREATININE: 0.81 mg/dL (ref 0.40–1.20)
Calcium: 9.1 mg/dL (ref 8.4–10.5)
GFR: 72.83 mL/min (ref >60.00)
GLUCOSE: 117 mg/dL — AB (ref 70–99)
Potassium: 4.4 mEq/L (ref 3.5–5.1)
SODIUM: 135 meq/L (ref 135–145)
Total Bilirubin: 0.5 mg/dL (ref 0.2–1.2)
Total Protein: 6.7 g/dL (ref 6.0–8.3)

## 2016-06-30 LAB — IRON: Iron: 18 ug/dL — ABNORMAL LOW (ref 42–145)

## 2016-06-30 LAB — HEMOGLOBIN A1C: HEMOGLOBIN A1C: 6.4 % (ref 4.6–6.5)

## 2016-06-30 LAB — VITAMIN B12: Vitamin B-12: 373 pg/mL (ref 211–911)

## 2016-06-30 LAB — FERRITIN: FERRITIN: 6.7 ng/mL — AB (ref 10.0–291.0)

## 2016-06-30 LAB — TSH: TSH: 1.23 u[IU]/mL (ref 0.35–4.50)

## 2016-06-30 NOTE — Therapy (Signed)
[place], [place], [address] Phone: [phone]   Fax:  [phone]  Physical Therapy Treatment  Patient Details  Name: Emily Sweeney MRN: [id number] Date of Birth: [birth year] Referring Provider: Dr. Alfonso Patten. Tat  Encounter Date: 06/30/2016      PT End of Session - 06/30/16 0928    Visit Number 5   Number of Visits 16   Date for PT Re-Evaluation 08/08/16   PT Start Time 0848   PT Stop Time 0939   PT Time Calculation (min) 51 min   Activity Tolerance Patient tolerated treatment well   Behavior During Therapy [facility] for tasks assessed/performed      Past Medical History:  Diagnosis Date  . Anemia   . Blood transfusion without reported diagnosis   . Diverticulosis of colon   . GERD (gastroesophageal reflux disease)   . Hearing loss    right ear, and tinnitus  . Hyperlipidemia   . Hypertension   . Tubular adenoma 03/05/2015   Polyp, and Benign lymphoid polyp.    Past Surgical History:  Procedure Laterality Date  . APPENDECTOMY    . BREAST BIOPSY     benign  . BREAST LUMPECTOMY    . COCHLEAR IMPLANT    . CRANIECTOMY FOR EXCISION OF ACOUSTIC NEUROMA  2009  . NASAL SEPTUM SURGERY    . TONSILLECTOMY      There were no vitals filed for this visit.      Subjective Assessment - 06/30/16 0854    Subjective Had a good deal of pain last night cannot figure what triggered it.  Now its OK.    Currently in Pain? Yes   Pain Score 2    Pain Location Back   Pain Orientation Right;Lower   Pain Descriptors / Indicators Aching   Pain Type Chronic pain   Pain Onset More than a month ago   Pain Frequency Intermittent   Aggravating Factors  reaching and twisting    Pain Relieving Factors rest, heat           [facility] Adult PT Treatment/Exercise - 06/30/16 0859      Lumbar Exercises: Stretches   Single Knee to Chest Stretch 5 reps;20 seconds   Lower Trunk Rotation 5 reps;10 seconds   Lower Trunk  Rotation Limitations with ball x 10      Lumbar Exercises: Aerobic   Stationary Bike NuStep LE only 5 min L 5     Lumbar Exercises: Supine   Heel Slides 15 reps   Heel Slides Limitations lower ab cues with ball    Bridge 10 reps   Bridge Limitations feet on ball    Straight Leg Raise 10 reps   Large Ball Abdominal Isometric 10 reps     Moist Heat Therapy   Number Minutes Moist Heat 15 Minutes   Moist Heat Location Lumbar Spine     Manual Therapy   Manual Therapy Soft tissue mobilization   Manual therapy comments mod pressure    Soft tissue mobilization Rt. (and min L) lumbar paraspinals upper lumbar focus in prone and sidelying L , MFR                 PT Education - 06/30/16 0927    Education Details soft tissue L2-L3 referral    Person(s) Educated Patient   Methods Explanation   Comprehension Verbalized understanding          PT Short Term Goals - 06/20/16 [phone]  PT SHORT TERM GOAL #1   Title Pt will be I with HEP    Baseline issued today   Time 4   Period Weeks   Status On-going     PT SHORT TERM GOAL #2   Title Pt will be able to sit to stand x 5 from chair in gym within 30 sec    Time 4   Period Weeks   Status Unable to assess           PT Long Term Goals - 06/13/16 1847      PT LONG TERM GOAL #1   Title Pt will score less than 40% on FOTO   Time 8   Period Weeks   Status New     PT LONG TERM GOAL #2   Title Berg balance score will improve to 46/56 or more to demo decreased fall risk    Time 8   Period Weeks   Status New     PT LONG TERM GOAL #3   Title Pt will score 4+/5 or more in bilateral LEs for improved transfers, gait stability.   Time 8   Period Weeks   Status New     PT LONG TERM GOAL #4   Title Pt will be able to stand, walk in the community for 30 min with min pain increase, relieved by rest.    Time 8   Period Weeks   Status New     PT LONG TERM GOAL #5   Title DGI, other balance and gait speed testing TBA    Time 8   Period Weeks   Status New               Plan - 06/30/16 0929    Clinical Impression Statement Worked on area of pain post mat strengthening.  Soft tissue beneficial for her post treatment, min tightness L2    PT Next Visit Plan check mat HEP, simple balance ex , modalities if needed, check gait and try to finish DGI , did massage effect last?    PT Home Exercise Plan corner balance exercises, bridge and KTC and LTR      Patient will benefit from skilled therapeutic intervention in order to improve the following deficits and impairments:     Visit Diagnosis: Other abnormalities of gait and mobility  Muscle weakness (generalized)  Chronic right-sided low back pain with sciatica, sciatica laterality unspecified  Abnormal posture     Problem List Patient Active Problem List   Diagnosis Date Noted  . Chronic lumbar radiculopathy 06/17/2016  . Poor balance 05/24/2016  . At high risk for falls 05/24/2016  . Lumbosacral spondylosis without myelopathy 01/19/2016  . S/P excision of acoustic neuroma 01/19/2016  . Pruritus 01/19/2016  . Abdominal gas pain 01/19/2016  . B12 deficiency 10/17/2015  . Pernicious anemia 06/09/2015  . PVC (premature ventricular contraction) 02/25/2015  . Osteopenia, senile 01/12/2015  . Vitamin D deficiency 01/12/2015  . Nonspecific abnormal electrocardiogram (ECG) (EKG) 10/16/2014  . Hyperglycemia 12/18/2011  . DIVERTICULITIS OF COLON 10/11/2009  . HYPERCHOLESTEROLEMIA, PURE 10/21/2008  . Essential hypertension 10/21/2008    Sweeney,Emily 06/30/2016, 9:30 AM  [facility] [address] [place], [place], [address] Phone: [phone]   Fax:  [phone]  Name: Emily Sweeney MRN: [id number] Date of Birth: 05-Nov-1940   Raeford Razor, PT 06/30/16 9:30 AM Phone: [phone] Fax: [phone]

## 2016-07-01 ENCOUNTER — Telehealth: Payer: Self-pay | Admitting: Internal Medicine

## 2016-07-01 DIAGNOSIS — D509 Iron deficiency anemia, unspecified: Secondary | ICD-10-CM

## 2016-07-01 DIAGNOSIS — E538 Deficiency of other specified B group vitamins: Secondary | ICD-10-CM

## 2016-07-01 NOTE — Telephone Encounter (Signed)
Re: blood work  Her B12 level is better than 8 months ago - she needs to continue the B12 injections.  Her kidney function, liver tests and thyroid function are normal.  Her sugars are stable in the prediabetic range, but she is close to becoming a diabetic.  She has anemia that has developed about one year and has gotten worse.  Again she needs to continue the B12 injections, but her iron levels are low.  I would like her to do the stool cards to make sure there is no blood even though she did a colonoscopy just over one year ago.    After turning in the cards I want her to start taking OTC iron - Slow Fe - one daily,  We need to repeat blood work in 8 weeks (ordered)

## 2016-07-03 ENCOUNTER — Ambulatory Visit (INDEPENDENT_AMBULATORY_CARE_PROVIDER_SITE_OTHER): Payer: Medicare Other

## 2016-07-03 ENCOUNTER — Ambulatory Visit: Payer: Medicare Other | Admitting: Physical Therapy

## 2016-07-03 DIAGNOSIS — R2689 Other abnormalities of gait and mobility: Secondary | ICD-10-CM

## 2016-07-03 DIAGNOSIS — R293 Abnormal posture: Secondary | ICD-10-CM | POA: Diagnosis not present

## 2016-07-03 DIAGNOSIS — E538 Deficiency of other specified B group vitamins: Secondary | ICD-10-CM

## 2016-07-03 DIAGNOSIS — G8929 Other chronic pain: Secondary | ICD-10-CM | POA: Diagnosis not present

## 2016-07-03 DIAGNOSIS — M6281 Muscle weakness (generalized): Secondary | ICD-10-CM | POA: Diagnosis not present

## 2016-07-03 DIAGNOSIS — M5440 Lumbago with sciatica, unspecified side: Secondary | ICD-10-CM

## 2016-07-03 MED ORDER — CYANOCOBALAMIN 1000 MCG/ML IJ SOLN
1000.0000 ug | Freq: Once | INTRAMUSCULAR | Status: AC
  Administered 2016-07-03: 1000 ug via INTRAMUSCULAR

## 2016-07-03 NOTE — Telephone Encounter (Signed)
LVM for pt to call back and discuss.  

## 2016-07-03 NOTE — Telephone Encounter (Signed)
Spoke with pt to inform. Pt should keep follow-up appt on Dec 7th.

## 2016-07-03 NOTE — Therapy (Signed)
[place], [place], [address] Phone: [phone]   Fax:  [phone]  Physical Therapy Treatment  Patient Details  Name: Emily Sweeney MRN: [id number] Date of Birth: 12-07-1940 Referring Provider: Dr. Alfonso Patten. Tat  Encounter Date: 07/03/2016      PT End of Session - 07/03/16 [id number]    Visit Number 6   Number of Visits 16   Date for PT Re-Evaluation 08/08/16   PT Start Time 0758   PT Stop Time 0900   PT Time Calculation (min) 62 min   Activity Tolerance Patient tolerated treatment well   Behavior During Therapy [facility] for tasks assessed/performed      Past Medical History:  Diagnosis Date  . Anemia   . Blood transfusion without reported diagnosis   . Diverticulosis of colon   . GERD (gastroesophageal reflux disease)   . Hearing loss    right ear, and tinnitus  . Hyperlipidemia   . Hypertension   . Tubular adenoma 03/05/2015   Polyp, and Benign lymphoid polyp.    Past Surgical History:  Procedure Laterality Date  . APPENDECTOMY    . BREAST BIOPSY     benign  . BREAST LUMPECTOMY    . COCHLEAR IMPLANT    . CRANIECTOMY FOR EXCISION OF ACOUSTIC NEUROMA  2009  . NASAL SEPTUM SURGERY    . TONSILLECTOMY      There were no vitals filed for this visit.      Subjective Assessment - 07/03/16 0816    Subjective I realized that lifting aggravtes my back (taking out the trash).  Pain is pretty good, 3/10.    Currently in Pain? Yes   Pain Score 3    Pain Location Back   Pain Orientation Right;Lower   Pain Descriptors / Indicators Aching   Pain Type Chronic pain   Pain Onset More than a month ago   Pain Frequency Intermittent   Aggravating Factors  lifting, reaching and twisting    Pain Relieving Factors rest, heat             [facility] PT Assessment - 07/03/16 0806      Dynamic Gait Index   Level Surface Mild Impairment   Change in Gait Speed Moderate Impairment   Gait with Horizontal Head  Turns Severe Impairment   Gait with Vertical Head Turns Severe Impairment   Gait and Pivot Turn Moderate Impairment   Step Over Obstacle Mild Impairment   Step Around Obstacles Mild Impairment   Steps Normal   Total Score 11           [facility] Adult PT Treatment/Exercise - 07/03/16 0830      Ambulation/Gait   Ambulation/Gait Yes   Ambulation/Gait Assistance 6: Modified independent (Device/Increase time)   Ambulation Distance (Feet) 300 Feet   Assistive device Straight cane   Gait Pattern Right flexed knee in stance;Left flexed knee in stance;Ataxic;Trunk rotated posteriorly on right;Wide base of support   Ambulation Surface Level;Indoor   Stairs Yes   Stairs Assistance 6: Modified independent (Device/Increase time)   Stair Management Technique One rail Right;Two rails;Alternating pattern   Number of Stairs 12   Height of Stairs 4  6   Pre-Gait Activities tandem stance hold 30 sec    Gait Comments pt reluctant to using cane      Lumbar Exercises: Stretches   Single Knee to Chest Stretch 5 reps;20 seconds   Lower Trunk Rotation 5 reps;10 seconds     Lumbar Exercises:  Aerobic   Stationary Bike UE and LE level 5 for 6 min      Lumbar Exercises: Supine   Other Supine Lumbar Exercises 4 way hip SLR x 10 each , pain with extension of Rt. LE      Knee/Hip Exercises: Standing   Heel Raises Both;1 set;20 reps   Forward Step Up Both;1 set;20 reps;Hand Hold: 1   SLS with Vectors diagonal ext/abdx 10 each      Moist Heat Therapy   Moist Heat Location Lumbar Spine                PT Education - 07/03/16 1051    Education provided Yes   Education Details gait with cane, technique and rationale   Person(s) Educated Patient   Methods Explanation;Demonstration;Verbal cues   Comprehension Verbalized understanding;Returned demonstration;Need further instruction          PT Short Term Goals - 06/20/16 0916      PT SHORT TERM GOAL #1   Title Pt will be I with HEP     Baseline issued today   Time 4   Period Weeks   Status On-going     PT SHORT TERM GOAL #2   Title Pt will be able to sit to stand x 5 from chair in gym within 30 sec    Time 4   Period Weeks   Status Unable to assess           PT Long Term Goals - 07/03/16 [id number]      PT LONG TERM GOAL #1   Title Pt will score less than 40% on FOTO   Status On-going     PT LONG TERM GOAL #2   Title Berg balance score will improve to 46/56 or more to demo decreased fall risk    Status On-going     PT LONG TERM GOAL #3   Title Pt will score 4+/5 or more in bilateral LEs for improved transfers, gait stability.   Status On-going     PT LONG TERM GOAL #4   Title Pt will be able to stand, walk in the community for 30 min with min pain increase, relieved by rest.    Baseline when I have leg pain walking does not make it better, walking doesnt necessarily increase my pain      PT LONG TERM GOAL #5   Title Pt will walk with cane for improved balance and gait stability, use for DGI and improve to 17/24.    Period Weeks   Status New               Plan - 07/03/16 0825    Clinical Impression Statement Pt progressing slowly towards goals.  Uncertain how a cane would help her walk better when she has the dog.  DGI score 11/24.     PT Next Visit Plan check mat HEP, functional strength check sit to stand , simple balance ex , modalities if needed, check gait    PT Home Exercise Plan corner balance exercises, bridge and KTC and LTR   Consulted and Agree with Plan of Care Patient      Patient will benefit from skilled therapeutic intervention in order to improve the following deficits and impairments:  Abnormal gait, Decreased coordination, Decreased range of motion, Difficulty walking, Increased fascial restricitons, Pain, Decreased balance, Decreased mobility, Decreased strength, Postural dysfunction  Visit Diagnosis: Other abnormalities of gait and mobility  Muscle weakness  (generalized)  Chronic right-sided low  back pain with sciatica, sciatica laterality unspecified  Abnormal posture     Problem List Patient Active Problem List   Diagnosis Date Noted  . Iron deficiency anemia 07/01/2016  . Chronic lumbar radiculopathy 06/17/2016  . Poor balance 05/24/2016  . At high risk for falls 05/24/2016  . Lumbosacral spondylosis without myelopathy 01/19/2016  . S/P excision of acoustic neuroma 01/19/2016  . Pruritus 01/19/2016  . Abdominal gas pain 01/19/2016  . B12 deficiency 10/17/2015  . Pernicious anemia 06/09/2015  . PVC (premature ventricular contraction) 02/25/2015  . Osteopenia, senile 01/12/2015  . Vitamin D deficiency 01/12/2015  . Nonspecific abnormal electrocardiogram (ECG) (EKG) 10/16/2014  . Hyperglycemia 12/18/2011  . DIVERTICULITIS OF COLON 10/11/2009  . HYPERCHOLESTEROLEMIA, PURE 10/21/2008  . Essential hypertension 10/21/2008    Toure Edmonds 07/03/2016, 10:52 AM  [facility] [address] [place], [place], [address] Phone: [phone]   Fax:  [phone]  Name: Chrystyna Peary MRN: [id number] Date of Birth: [birth year]  Raeford Razor, PT 07/03/16 10:52 AM Phone: [phone] Fax: [phone]

## 2016-07-05 ENCOUNTER — Ambulatory Visit: Payer: Medicare Other | Admitting: Physical Therapy

## 2016-07-10 ENCOUNTER — Ambulatory Visit: Payer: Medicare Other | Admitting: Physical Therapy

## 2016-07-10 DIAGNOSIS — R2689 Other abnormalities of gait and mobility: Secondary | ICD-10-CM | POA: Diagnosis not present

## 2016-07-10 DIAGNOSIS — R293 Abnormal posture: Secondary | ICD-10-CM | POA: Diagnosis not present

## 2016-07-10 DIAGNOSIS — M6281 Muscle weakness (generalized): Secondary | ICD-10-CM | POA: Diagnosis not present

## 2016-07-10 DIAGNOSIS — M5440 Lumbago with sciatica, unspecified side: Secondary | ICD-10-CM

## 2016-07-10 DIAGNOSIS — G8929 Other chronic pain: Secondary | ICD-10-CM

## 2016-07-10 NOTE — Therapy (Signed)
[place], [place], [address] Phone: [phone]   Fax:  [phone]  Physical Therapy Treatment  Patient Details  Name: Emily Sweeney MRN: [id number] Date of Birth: Mar 12, 1941 Referring Provider: Dr. Alfonso Patten. Tat  Encounter Date: 07/10/2016      PT End of Session - 07/10/16 0834    Visit Number 7   Number of Visits 16   Date for PT Re-Evaluation 08/08/16   PT Start Time 0814   PT Stop Time 0855   PT Time Calculation (min) 41 min   Activity Tolerance Patient tolerated treatment well   Behavior During Therapy [facility] for tasks assessed/performed      Past Medical History:  Diagnosis Date  . Anemia   . Blood transfusion without reported diagnosis   . Diverticulosis of colon   . GERD (gastroesophageal reflux disease)   . Hearing loss    right ear, and tinnitus  . Hyperlipidemia   . Hypertension   . Tubular adenoma 03/05/2015   Polyp, and Benign lymphoid polyp.    Past Surgical History:  Procedure Laterality Date  . APPENDECTOMY    . BREAST BIOPSY     benign  . BREAST LUMPECTOMY    . COCHLEAR IMPLANT    . CRANIECTOMY FOR EXCISION OF ACOUSTIC NEUROMA  2009  . NASAL SEPTUM SURGERY    . TONSILLECTOMY      There were no vitals filed for this visit.      Subjective Assessment - 07/10/16 0815    Subjective Right now my back is OK.  Was moderate pain all day yesterday including some leg pain.    Currently in Pain? Yes   Pain Score 1    Pain Location Back   Pain Orientation Lower;Mid   Pain Descriptors / Indicators Aching   Pain Type Chronic pain   Pain Onset More than a month ago   Aggravating Factors  over activity, walking and standing    Pain Relieving Factors sit, rest heat and stretching              [facility] Adult PT Treatment/Exercise - 07/10/16 0819      Lumbar Exercises: Stretches   Active Hamstring Stretch 2 reps;30 seconds   Single Knee to Chest Stretch 2 reps;30 seconds    Single Knee to Chest Stretch Limitations --   Lower Trunk Rotation 5 reps;20 seconds     Lumbar Exercises: Standing   Heel Raises 15 reps   Heel Raises Limitations ball as UE support   Other Standing Lumbar Exercises Ball CPR x 10 for forced exhale for abdominals    Other Standing Lumbar Exercises head turns for balance, cues for      Lumbar Exercises: Supine   Heel Slides 15 reps   Heel Slides Limitations lower ab cues with ball    Bridge 10 reps   Bridge Limitations feet on ball    Large Ball Oblique Isometric 10 reps   Large Ball Oblique Isometric Limitations oblique twist      Lumbar Exercises: Prone   Other Prone Lumbar Exercises Ab set x 10, prone pelvic press series: knee flexion, hip ext x 10 each      Moist Heat Therapy   Number Minutes Moist Heat 10 Minutes   Moist Heat Location Lumbar Spine                PT Education - 07/10/16 [id number]    Education provided Yes   Education Details standing posture  Person(s) Educated Patient   Methods Explanation   Comprehension Verbalized understanding          PT Short Term Goals - 07/10/16 0840      PT SHORT TERM GOAL #1   Title Pt will be I with HEP    Status Achieved     PT SHORT TERM GOAL #2   Title Pt will be able to sit to stand x 5 from chair in gym within 30 sec            PT Long Term Goals - 07/10/16 0840      PT LONG TERM GOAL #1   Title Pt will score less than 40% on FOTO   Status On-going     PT LONG TERM GOAL #2   Title Berg balance score will improve to 46/56 or more to demo decreased fall risk    Status On-going     PT LONG TERM GOAL #3   Title Pt will score 4+/5 or more in bilateral LEs for improved transfers, gait stability.   Status On-going     PT LONG TERM GOAL #4   Title Pt will be able to stand, walk in the community for 30 min with min pain increase, relieved by rest.    Baseline when I have leg pain walking does not make it better, walking doesnt necessarily increase my  pain    Status On-going     PT LONG TERM GOAL #5   Title Pt will walk with cane for improved balance and gait stability, use for DGI and improve to 17/24.    Status On-going               Plan - 07/10/16 0834    Clinical Impression Statement Pt late today, with family in town she did alot more that usual.  Had mod pain in Rt leg this weekend.  Her HEP helps her pain.  Lacks core control.     PT Next Visit Plan check mat HEP, functional strength check sit to stand , simple balance ex , modalities if needed, check gait    PT Home Exercise Plan corner balance exercises, bridge and KTC and LTR   Consulted and Agree with Plan of Care Patient      Patient will benefit from skilled therapeutic intervention in order to improve the following deficits and impairments:  Abnormal gait, Decreased coordination, Decreased range of motion, Difficulty walking, Increased fascial restricitons, Pain, Decreased balance, Decreased mobility, Decreased strength, Postural dysfunction  Visit Diagnosis: Other abnormalities of gait and mobility  Muscle weakness (generalized)  Chronic right-sided low back pain with sciatica, sciatica laterality unspecified  Abnormal posture     Problem List Patient Active Problem List   Diagnosis Date Noted  . Iron deficiency anemia 07/01/2016  . Chronic lumbar radiculopathy 06/17/2016  . Poor balance 05/24/2016  . At high risk for falls 05/24/2016  . Lumbosacral spondylosis without myelopathy 01/19/2016  . S/P excision of acoustic neuroma 01/19/2016  . Pruritus 01/19/2016  . Abdominal gas pain 01/19/2016  . B12 deficiency 10/17/2015  . Pernicious anemia 06/09/2015  . PVC (premature ventricular contraction) 02/25/2015  . Osteopenia, senile 01/12/2015  . Vitamin D deficiency 01/12/2015  . Nonspecific abnormal electrocardiogram (ECG) (EKG) 10/16/2014  . Hyperglycemia 12/18/2011  . DIVERTICULITIS OF COLON 10/11/2009  . HYPERCHOLESTEROLEMIA, PURE 10/21/2008   . Essential hypertension 10/21/2008    Emily Sweeney 07/10/2016, 9:57 AM  [place], [place],  [id number] Phone: [phone]   Fax:  [phone]  Name: Emily Sweeney MRN: [id number] Date of Birth: 1941/03/29   Raeford Razor, PT 07/10/16 9:57 AM Phone: [phone] Fax: [phone]

## 2016-07-13 ENCOUNTER — Ambulatory Visit: Payer: Medicare Other | Admitting: Physical Therapy

## 2016-07-13 DIAGNOSIS — M6281 Muscle weakness (generalized): Secondary | ICD-10-CM | POA: Diagnosis not present

## 2016-07-13 DIAGNOSIS — R2689 Other abnormalities of gait and mobility: Secondary | ICD-10-CM | POA: Diagnosis not present

## 2016-07-13 DIAGNOSIS — R293 Abnormal posture: Secondary | ICD-10-CM

## 2016-07-13 DIAGNOSIS — G8929 Other chronic pain: Secondary | ICD-10-CM | POA: Diagnosis not present

## 2016-07-13 DIAGNOSIS — M5440 Lumbago with sciatica, unspecified side: Secondary | ICD-10-CM

## 2016-07-13 NOTE — Therapy (Signed)
[place], [place], [address] Phone: [phone]   Fax:  [phone]  Physical Therapy Treatment  Patient Details  Name: Emily Sweeney MRN: [id number] Date of Birth: 1940-10-28 Referring Provider: Dr. Alfonso Patten. Tat  Encounter Date: 07/13/2016      PT End of Session - 07/13/16 1000    Visit Number 8   Number of Visits 16   Date for PT Re-Evaluation 08/08/16   Authorization Type GCODE DONE AT VISIT 8   PT Start Time 0800   PT Stop Time 0850   PT Time Calculation (min) 50 min   Activity Tolerance Patient tolerated treatment well   Behavior During Therapy [facility] for tasks assessed/performed      Past Medical History:  Diagnosis Date  . Anemia   . Blood transfusion without reported diagnosis   . Diverticulosis of colon   . GERD (gastroesophageal reflux disease)   . Hearing loss    right ear, and tinnitus  . Hyperlipidemia   . Hypertension   . Tubular adenoma 03/05/2015   Polyp, and Benign lymphoid polyp.    Past Surgical History:  Procedure Laterality Date  . APPENDECTOMY    . BREAST BIOPSY     benign  . BREAST LUMPECTOMY    . COCHLEAR IMPLANT    . CRANIECTOMY FOR EXCISION OF ACOUSTIC NEUROMA  2009  . NASAL SEPTUM SURGERY    . TONSILLECTOMY      There were no vitals filed for this visit.      Subjective Assessment - 07/13/16 0959    Subjective No pain this morning, thats new! I see Dr. Vertell Limber Monday.    Currently in Pain? No/denies           [facility] Adult PT Treatment/Exercise - 07/13/16 1002      High Level Balance   High Level Balance Activities Backward walking;Tandem walking;Other (comment)   High Level Balance Comments Static balance and strengthening on foam pad: static, eyes open/closed, narrow BOS, tandem static, tandem gait FW and reverse in parallel bars, needed mod UE assist with activities.      Lumbar Exercises: Stretches   Single Knee to Chest Stretch 3 reps;30 seconds   Lower Trunk Rotation 10 seconds   Lower Trunk Rotation Limitations x 10      Lumbar Exercises: Aerobic   Stationary Bike NUStep L4 UE and LE for 6 min      Lumbar Exercises: Seated   Hip Flexion on Ball Strengthening;Both;15 reps   Other Seated Lumbar Exercises Pelvic tilts, with and without UE assist, toe, heel taps, march and upper trunk twist.       Lumbar Exercises: Sidelying   Clam 20 reps   Hip Abduction 10 reps   Hip Abduction Weights (lbs) 2 sets good form      Moist Heat Therapy   Number Minutes Moist Heat 10 Minutes   Moist Heat Location Lumbar Spine                PT Education - 07/13/16 1000    Education provided Yes   Education Details balance, therapy ball exercises, POC   Person(s) Educated Patient   Methods Explanation   Comprehension Verbalized understanding;Returned demonstration;Tactile cues required;Verbal cues required;Need further instruction          PT Short Term Goals - 07/10/16 0840      PT SHORT TERM GOAL #1   Title Pt will be I with HEP    Status Achieved  PT SHORT TERM GOAL #2   Title Pt will be able to sit to stand x 5 from chair in gym within 30 sec            PT Long Term Goals - 07/10/16 0840      PT LONG TERM GOAL #1   Title Pt will score less than 40% on FOTO   Status On-going     PT LONG TERM GOAL #2   Title Berg balance score will improve to 46/56 or more to demo decreased fall risk    Status On-going     PT LONG TERM GOAL #3   Title Pt will score 4+/5 or more in bilateral LEs for improved transfers, gait stability.   Status On-going     PT LONG TERM GOAL #4   Title Pt will be able to stand, walk in the community for 30 min with min pain increase, relieved by rest.    Baseline when I have leg pain walking does not make it better, walking doesnt necessarily increase my pain    Status On-going     PT LONG TERM GOAL #5   Title Pt will walk with cane for improved balance and gait stability, use for DGI and  improve to 17/24.    Status On-going               Plan - 31-Jul-2016 1001    Clinical Impression Statement Patient with min increase in back pain with standing exercises.  Worked on standing ex and balance as tolerated.  Flexion of hips and spine improve pain.  Pt can walk for approx. 5 min then feels her balance starts to decline.  SHe is not limited by back and leg pain with walking very short distances.  Cont POC, progressing slowly, FOTO improved about 10 % .  She may benefit from transition to Neuro after 2 weeks.    PT Next Visit Plan check mat HEP, functional strength check sit to stand , simple balance ex , modalities if needed, check gait    PT Home Exercise Plan corner balance exercises, bridge and KTC and LTR   Recommended Other Services Neuro transition?    Consulted and Agree with Plan of Care Patient      Patient will benefit from skilled therapeutic intervention in order to improve the following deficits and impairments:  Abnormal gait, Decreased coordination, Decreased range of motion, Difficulty walking, Increased fascial restricitons, Pain, Decreased balance, Decreased mobility, Decreased strength, Postural dysfunction  Visit Diagnosis: Other abnormalities of gait and mobility  Muscle weakness (generalized)  Chronic right-sided low back pain with sciatica, sciatica laterality unspecified  Abnormal posture       G-Codes - 07-31-16 1009    Functional Assessment Tool Used FOTO   Functional Limitation Mobility: Walking and moving around   Mobility: Walking and Moving Around Current Status [id number]) At least 40 percent but less than 60 percent impaired, limited or restricted   Mobility: Walking and Moving Around Goal Status [phone]) At least 20 percent but less than 40 percent impaired, limited or restricted      Problem List Patient Active Problem List   Diagnosis Date Noted  . Iron deficiency anemia 07/01/2016  . Chronic lumbar radiculopathy 06/17/2016  .  Poor balance 05/24/2016  . At high risk for falls 05/24/2016  . Lumbosacral spondylosis without myelopathy 01/19/2016  . S/P excision of acoustic neuroma 01/19/2016  . Pruritus 01/19/2016  . Abdominal gas pain 01/19/2016  . B12 deficiency  10/17/2015  . Pernicious anemia 06/09/2015  . PVC (premature ventricular contraction) 02/25/2015  . Osteopenia, senile 01/12/2015  . Vitamin D deficiency 01/12/2015  . Nonspecific abnormal electrocardiogram (ECG) (EKG) 10/16/2014  . Hyperglycemia 12/18/2011  . DIVERTICULITIS OF COLON 10/11/2009  . HYPERCHOLESTEROLEMIA, PURE 10/21/2008  . Essential hypertension 10/21/2008    PAA,JENNIFER 07/13/2016, 10:10 AM  [facility] [address] [place], [place], [address] Phone: [phone]   Fax:  [phone]  Name: Emily Sweeney MRN: [id number] Date of Birth: [birth year]  Raeford Razor, PT 07/13/16 10:10 AM Phone: [phone] Fax: [phone]

## 2016-07-14 ENCOUNTER — Other Ambulatory Visit (INDEPENDENT_AMBULATORY_CARE_PROVIDER_SITE_OTHER): Payer: Medicare Other

## 2016-07-14 DIAGNOSIS — Z1211 Encounter for screening for malignant neoplasm of colon: Secondary | ICD-10-CM

## 2016-07-17 ENCOUNTER — Ambulatory Visit: Payer: Medicare Other | Admitting: Neurology

## 2016-07-17 DIAGNOSIS — M4316 Spondylolisthesis, lumbar region: Secondary | ICD-10-CM | POA: Diagnosis not present

## 2016-07-17 DIAGNOSIS — M5126 Other intervertebral disc displacement, lumbar region: Secondary | ICD-10-CM | POA: Diagnosis not present

## 2016-07-17 DIAGNOSIS — M545 Low back pain: Secondary | ICD-10-CM | POA: Diagnosis not present

## 2016-07-17 DIAGNOSIS — M4126 Other idiopathic scoliosis, lumbar region: Secondary | ICD-10-CM | POA: Diagnosis not present

## 2016-07-17 DIAGNOSIS — M542 Cervicalgia: Secondary | ICD-10-CM | POA: Diagnosis not present

## 2016-07-17 DIAGNOSIS — M9983 Other biomechanical lesions of lumbar region: Secondary | ICD-10-CM | POA: Diagnosis not present

## 2016-07-17 DIAGNOSIS — M5416 Radiculopathy, lumbar region: Secondary | ICD-10-CM | POA: Diagnosis not present

## 2016-07-17 LAB — FECAL OCCULT BLOOD, IMMUNOCHEMICAL: FECAL OCCULT BLD: NEGATIVE

## 2016-07-20 ENCOUNTER — Ambulatory Visit (INDEPENDENT_AMBULATORY_CARE_PROVIDER_SITE_OTHER): Payer: Medicare Other | Admitting: Internal Medicine

## 2016-07-20 ENCOUNTER — Encounter: Payer: Self-pay | Admitting: Internal Medicine

## 2016-07-20 ENCOUNTER — Ambulatory Visit: Payer: Medicare Other | Admitting: Internal Medicine

## 2016-07-20 VITALS — BP 130/76 | HR 62 | Temp 98.4°F | Resp 16 | Ht 66.0 in | Wt 150.0 lb

## 2016-07-20 DIAGNOSIS — M5416 Radiculopathy, lumbar region: Secondary | ICD-10-CM

## 2016-07-20 DIAGNOSIS — I10 Essential (primary) hypertension: Secondary | ICD-10-CM | POA: Diagnosis not present

## 2016-07-20 DIAGNOSIS — E538 Deficiency of other specified B group vitamins: Secondary | ICD-10-CM

## 2016-07-20 DIAGNOSIS — E559 Vitamin D deficiency, unspecified: Secondary | ICD-10-CM

## 2016-07-20 DIAGNOSIS — E7800 Pure hypercholesterolemia, unspecified: Secondary | ICD-10-CM

## 2016-07-20 DIAGNOSIS — R2689 Other abnormalities of gait and mobility: Secondary | ICD-10-CM

## 2016-07-20 DIAGNOSIS — R7303 Prediabetes: Secondary | ICD-10-CM | POA: Diagnosis not present

## 2016-07-20 DIAGNOSIS — D509 Iron deficiency anemia, unspecified: Secondary | ICD-10-CM

## 2016-07-20 MED ORDER — CYANOCOBALAMIN 1000 MCG/ML IJ SOLN
1000.0000 ug | Freq: Once | INTRAMUSCULAR | Status: AC
  Administered 2016-07-20: 1000 ug via INTRAMUSCULAR

## 2016-07-20 NOTE — Assessment & Plan Note (Signed)
Continue daily statin. 

## 2016-07-20 NOTE — Progress Notes (Signed)
Subjective:    Patient ID: Emily Sweeney, female    DOB: [birth year], 75 y.o.   MRN: [id number]  HPI The patient is here for follow up.  Hypertension: She is taking her medication daily. She is compliant with a low sodium diet.  She denies chest pain, freq palpitations, edema, shortness of breath and regular headaches. She is exercising regularly -  Doing PT.      Gait instability:  She last saw Dr. Carles Collet one month ago.She thought most of her gait instability was secondary to weakness in her hip flexors secondary to chronic L3-L4 radiculopathy. Since she is getting weaker she was referred to surgery-Dr. Vertell Limber for consideration of a surgical intervention.  She saw Dr Vertell Limber and he wants her to have a repeat dexa.  He plans on doing injections.  She is doing PT and he muscles in her legs feel stronger and her pain is slightly decreased.  Her balance is not any better.  She is interested in doing more specific balance PT.    Hyperlipidemia: She is taking her medication daily. She is compliant with a low fat/cholesterol diet. She is exercising regularly. She denies myalgias.   Prediabetes:  She is compliant with a low sugar/carbohydrate diet.  She is exercising regularly.  Anemia, iron def:  She has had anemia for a while, but it recently got worse.  She has rare blood on the TP when she wipes.  She occasionally has gas pain in her abdomen.  She denies obvious bleeding. She has started taking iron pills.  Her last colonoscopy was in 2016.  B12 def: she is getting her monthly B12 injections and her B12 level was checked last month.  Her next one is due later this month.   Medications and allergies reviewed with patient and updated if appropriate.  Patient Active Problem List   Diagnosis Date Noted  . Prediabetes 07/20/2016  . Iron deficiency anemia 07/01/2016  . Chronic lumbar radiculopathy 06/17/2016  . Poor balance 05/24/2016  . At high risk for falls 05/24/2016  . Lumbosacral spondylosis  without myelopathy 01/19/2016  . S/P excision of acoustic neuroma 01/19/2016  . Pruritus 01/19/2016  . Abdominal gas pain 01/19/2016  . B12 deficiency 10/17/2015  . Pernicious anemia 06/09/2015  . PVC (premature ventricular contraction) 02/25/2015  . Osteopenia, senile 01/12/2015  . Vitamin D deficiency 01/12/2015  . Nonspecific abnormal electrocardiogram (ECG) (EKG) 10/16/2014  . DIVERTICULITIS OF COLON 10/11/2009  . HYPERCHOLESTEROLEMIA, PURE 10/21/2008  . Essential hypertension 10/21/2008    Current Outpatient Prescriptions on File Prior to Visit  Medication Sig Dispense Refill  . aspirin EC 81 MG tablet Take 1 tablet (81 mg total) by mouth daily. 90 tablet 1  . atorvastatin (LIPITOR) 20 MG tablet TAKE 1 TABLET ONCE DAILY. 90 tablet 3  . BYSTOLIC 5 MG tablet TAKE 1 TABLET ONCE DAILY. 30 tablet 11  . calcium carbonate (TUMS - DOSED IN MG ELEMENTAL CALCIUM) 500 MG chewable tablet Chew 1 tablet by mouth daily.    . cyanocobalamin (,VITAMIN B-12,) 1000 MCG/ML injection Inject 1 mL (1,000 mcg total) into the muscle once. 1 mL 0  . naproxen sodium (ANAPROX) 220 MG tablet Take 220 mg by mouth as needed.     . polyvinyl alcohol (LIQUIFILM TEARS) 1.4 % ophthalmic solution Place 1 drop into both eyes daily as needed for dry eyes.    . ramipril (ALTACE) 10 MG capsule TAKE (1) CAPSULE DAILY. 90 capsule 2  . VITAMIN D, CHOLECALCIFEROL, PO  Take 5,000 Units by mouth daily.      No current facility-administered medications on file prior to visit.     Past Medical History:  Diagnosis Date  . Anemia   . Blood transfusion without reported diagnosis   . Diverticulosis of colon   . GERD (gastroesophageal reflux disease)   . Hearing loss    right ear, and tinnitus  . Hyperlipidemia   . Hypertension   . Tubular adenoma 03/05/2015   Polyp, and Benign lymphoid polyp.    Past Surgical History:  Procedure Laterality Date  . APPENDECTOMY    . BREAST BIOPSY     benign  . BREAST LUMPECTOMY      . COCHLEAR IMPLANT    . CRANIECTOMY FOR EXCISION OF ACOUSTIC NEUROMA  2009  . NASAL SEPTUM SURGERY    . TONSILLECTOMY      Social History   Social History  . Marital status: Divorced    Spouse name: N/A  . Number of children: N/A  . Years of education: N/A   Occupational History  . psychologist    Social History Main Topics  . Smoking status: Former Smoker    Quit date: 07/04/1991  . Smokeless tobacco: Never Used  . Alcohol use 0.0 oz/week     Comment: glass wine 4-5 days a week  . Drug use: No  . Sexual activity: Not Currently   Other Topics Concern  . Not on file   Social History Narrative  . No narrative on file    Family History  Problem Relation Age of Onset  . Sudden death Father 75    MI  . Coronary artery disease Father   . Dementia Mother 56  . Alcohol abuse Brother   . Colon cancer Neg Hx     Review of Systems  Constitutional: Positive for unexpected weight change (increase in weight w/o change in diet - 3 lbs). Negative for appetite change, chills and fever.  Respiratory: Negative for cough, shortness of breath and wheezing.        Mild chest congestion on occ  Cardiovascular: Positive for palpitations (occ). Negative for chest pain and leg swelling.  Gastrointestinal: Positive for abdominal pain (gas pain only), anal bleeding (rare, minimal) and constipation (from iron). Negative for blood in stool.  Neurological: Positive for light-headedness.       Objective:   Vitals:   07/20/16 0801  BP: 130/76  Pulse: 62  Resp: 16  Temp: 98.4 F (36.9 C)   Filed Weights   07/20/16 0801  Weight: 150 lb (68 kg)   Body mass index is 24.21 kg/m.   Physical Exam    Constitutional: Appears well-developed and well-nourished. No distress.  HENT:  Head: Normocephalic and atraumatic.  Neck: Neck supple. No tracheal deviation present. No thyromegaly present.  No cervical lymphadenopathy Cardiovascular: Normal rate, regular rhythm and normal heart  sounds.   No murmur heard. No carotid bruit .  No edema Pulmonary/Chest: Effort normal and breath sounds normal. No respiratory distress. No has no wheezes. No rales.  Skin: Skin is warm and dry. Not diaphoretic.  Psychiatric: Normal mood and affect. Behavior is normal.      Assessment & Plan:    See Problem List for Assessment and Plan of chronic medical problems.   F/u in 6 months , sooner if needed

## 2016-07-20 NOTE — Patient Instructions (Addendum)
Have blood work done in one month when you come for your B12 injection.  Test(s) ordered today. Your results will be released to [place] (or called to you) after review, usually within 72hours after test completion. If any changes need to be made, you will be notified at that same time.  All other Health Maintenance issues reviewed.   All recommended immunizations and age-appropriate screenings are up-to-date or discussed.  B12 injection administered today.   Medications reviewed and updated.  No changes recommended at this time.  A referral was ordered for PT for balance.  Please followup in 6 months

## 2016-07-20 NOTE — Progress Notes (Signed)
Pre visit review using our clinic review tool, if applicable. No additional management support is needed unless otherwise documented below in the visit note. 

## 2016-07-20 NOTE — Assessment & Plan Note (Addendum)
Will give B12 injection early and then again in one month to help boost her level Continue monthly B12 injections Recheck level in one month  - just prior to B12 injection

## 2016-07-20 NOTE — Assessment & Plan Note (Signed)
Check vitamin d level

## 2016-07-20 NOTE — Assessment & Plan Note (Signed)
Iron def anemia, also with B12 def that may be contributing Last B12 level reasonable - will give B12 injection today - a little early and continue B12 injections monthly Had colonoscopy 2016 - no need to repeat at this time - fecal occult neg Start iron - slow fe daily Recheck cbc, iron levels in one month

## 2016-07-20 NOTE — Assessment & Plan Note (Signed)
Has had MRI's and EMG - likely affecting her balance Has done PT and it has helped Still has problems with balance - will refer to PT at [facility] for balance Will have injections by Dr Vertell Limber Hopes to avoid surgery

## 2016-07-20 NOTE — Assessment & Plan Note (Signed)
a1c stable Will monitor Q 6 months

## 2016-07-20 NOTE — Assessment & Plan Note (Signed)
BP well controlled Current regimen effective and well tolerated Continue current medications at current doses  

## 2016-07-20 NOTE — Assessment & Plan Note (Signed)
Chronic lumbar radiculopathy may be contributing, but her anemia may be contributing as well Will refer to neuro PT for balance Will treat anemia

## 2016-07-24 ENCOUNTER — Ambulatory Visit: Payer: Medicare Other | Admitting: Physical Therapy

## 2016-07-25 ENCOUNTER — Other Ambulatory Visit: Payer: Self-pay | Admitting: Internal Medicine

## 2016-07-26 ENCOUNTER — Ambulatory Visit: Payer: Medicare Other | Attending: Internal Medicine | Admitting: Physical Therapy

## 2016-07-26 ENCOUNTER — Other Ambulatory Visit: Payer: Self-pay | Admitting: Internal Medicine

## 2016-07-26 DIAGNOSIS — R2681 Unsteadiness on feet: Secondary | ICD-10-CM | POA: Insufficient documentation

## 2016-07-26 DIAGNOSIS — R293 Abnormal posture: Secondary | ICD-10-CM | POA: Diagnosis not present

## 2016-07-26 DIAGNOSIS — G8929 Other chronic pain: Secondary | ICD-10-CM | POA: Diagnosis not present

## 2016-07-26 DIAGNOSIS — M6281 Muscle weakness (generalized): Secondary | ICD-10-CM | POA: Diagnosis not present

## 2016-07-26 DIAGNOSIS — R2689 Other abnormalities of gait and mobility: Secondary | ICD-10-CM | POA: Diagnosis not present

## 2016-07-26 DIAGNOSIS — M5440 Lumbago with sciatica, unspecified side: Secondary | ICD-10-CM | POA: Insufficient documentation

## 2016-07-26 NOTE — Therapy (Signed)
[place], [place], [address] Phone: [phone]   Fax:  [phone]  Physical Therapy Treatment  Patient Details  Name: Emily Sweeney MRN: [id number] Date of Birth: 1940-10-30 Referring Provider: Dr. Alfonso Patten. Tat  Encounter Date: 07/26/2016      PT End of Session - 07/26/16 0808    Visit Number 9   Number of Visits 16   Date for PT Re-Evaluation 08/08/16   Authorization Type GCODE DONE AT VISIT 8   PT Start Time 0800   PT Stop Time 0857   PT Time Calculation (min) 57 min   Activity Tolerance Patient tolerated treatment well   Behavior During Therapy [facility] for tasks assessed/performed      Past Medical History:  Diagnosis Date  . Anemia   . Blood transfusion without reported diagnosis   . Diverticulosis of colon   . GERD (gastroesophageal reflux disease)   . Hearing loss    right ear, and tinnitus  . Hyperlipidemia   . Hypertension   . Tubular adenoma 03/05/2015   Polyp, and Benign lymphoid polyp.    Past Surgical History:  Procedure Laterality Date  . APPENDECTOMY    . BREAST BIOPSY     benign  . BREAST LUMPECTOMY    . COCHLEAR IMPLANT    . CRANIECTOMY FOR EXCISION OF ACOUSTIC NEUROMA  2009  . NASAL SEPTUM SURGERY    . TONSILLECTOMY      There were no vitals filed for this visit.      Subjective Assessment - 07/26/16 0807    Subjective Has not been to PT for a while.  Dr. Vertell Limber recommended bone density and epidural steroid injection.  Did XR    Currently in Pain? Yes   Pain Score 3    Pain Location Back   Pain Orientation Lower   Pain Descriptors / Indicators Aching   Pain Type Chronic pain   Pain Radiating Towards Rt. leg anterior    Pain Onset More than a month ago   Pain Frequency Intermittent                         [facility] Adult PT Treatment/Exercise - 07/26/16 0814      Therapeutic Activites    Therapeutic Activities Other Therapeutic Activities   Other  Therapeutic Activities Standing balance: narrow BOS, tandem static and with head turns     Lumbar Exercises: Stretches   Lower Trunk Rotation 3 reps;30 seconds   Lower Trunk Rotation Limitations with hip/knee flexed      Lumbar Exercises: Aerobic   Stationary Bike NuStep L4 UE and LE for 6 min      Lumbar Exercises: Seated   Sit to Stand 5 reps;Other (comment)   Sit to Stand Limitations Worked on hip hinge with sit to stand to sit and mini squats.  USed yardstick to demo hinge.  Needs work but understands the concept.      Lumbar Exercises: Sidelying   Other Sidelying Lumbar Exercises book openings x 5 each side cues to slow down and breathe      Moist Heat Therapy   Number Minutes Moist Heat 10 Minutes   Moist Heat Location Lumbar Spine     Manual Therapy   Manual therapy comments unable to relax LEs for light traction, distraction .                    PT Short Term Goals -  07/26/16 0831      PT SHORT TERM GOAL #1   Title Pt will be I with HEP    Status Achieved     PT SHORT TERM GOAL #2   Title Pt will be able to sit to stand x 5 from chair in gym within 30 sec    Baseline 22 sec    Status Achieved           PT Long Term Goals - 07/26/16 0831      PT LONG TERM GOAL #1   Title Pt will score less than 40% on FOTO   Status On-going     PT LONG TERM GOAL #2   Title Berg balance score will improve to 46/56 or more to demo decreased fall risk    Status On-going     PT LONG TERM GOAL #3   Title Pt will score 4+/5 or more in bilateral LEs for improved transfers, gait stability.   Status On-going     PT LONG TERM GOAL #4   Title Pt will be able to stand, walk in the community for 30 min with min pain increase, relieved by rest.    Baseline when I have leg pain walking does not make it better, walking doesnt necessarily increase my pain    Status On-going     PT LONG TERM GOAL #5   Title Pt will walk with cane for improved balance and gait stability, use  for DGI and improve to 17/24.    Status On-going               Plan - 07/26/16 0829    Clinical Impression Statement Patient has not been to PT in about 2 weeks, due to weather and other MD appts.  She has noticed an increase in pain in back and Rt. LE. Stretches help but do not fully relieve the pain.   She has had 1 near fall.     PT Next Visit Plan check mat HEP, functional strength check sit to stand , simple balance ex , modalities if needed, check gait , HIP HINGE    PT Home Exercise Plan corner balance exercises, bridge and KTC and LTR   Consulted and Agree with Plan of Care Patient      Patient will benefit from skilled therapeutic intervention in order to improve the following deficits and impairments:  Abnormal gait, Decreased coordination, Decreased range of motion, Difficulty walking, Increased fascial restricitons, Pain, Decreased balance, Decreased mobility, Decreased strength, Postural dysfunction  Visit Diagnosis: Other abnormalities of gait and mobility  Muscle weakness (generalized)  Chronic right-sided low back pain with sciatica, sciatica laterality unspecified  Abnormal posture     Problem List Patient Active Problem List   Diagnosis Date Noted  . Prediabetes 07/20/2016  . Iron deficiency anemia 07/01/2016  . Chronic lumbar radiculopathy 06/17/2016  . Poor balance 05/24/2016  . At high risk for falls 05/24/2016  . Lumbosacral spondylosis without myelopathy 01/19/2016  . S/P excision of acoustic neuroma 01/19/2016  . Pruritus 01/19/2016  . Abdominal gas pain 01/19/2016  . B12 deficiency 10/17/2015  . Pernicious anemia 06/09/2015  . PVC (premature ventricular contraction) 02/25/2015  . Osteopenia, senile 01/12/2015  . Vitamin D deficiency 01/12/2015  . Nonspecific abnormal electrocardiogram (ECG) (EKG) 10/16/2014  . DIVERTICULITIS OF COLON 10/11/2009  . HYPERCHOLESTEROLEMIA, PURE 10/21/2008  . Essential hypertension 10/21/2008     Emily Sweeney 07/26/2016, 9:51 AM  [name]  [place], [place], [address] Phone: [phone]   Fax:  [phone]  Name: Emily Sweeney MRN: [id number] Date of Birth: 1940-12-31  Raeford Razor, PT 07/26/16 9:51 AM Phone: [phone] Fax: [phone]

## 2016-07-31 ENCOUNTER — Ambulatory Visit: Payer: Medicare Other | Admitting: Physical Therapy

## 2016-07-31 DIAGNOSIS — M5440 Lumbago with sciatica, unspecified side: Secondary | ICD-10-CM

## 2016-07-31 DIAGNOSIS — M6281 Muscle weakness (generalized): Secondary | ICD-10-CM | POA: Diagnosis not present

## 2016-07-31 DIAGNOSIS — R2681 Unsteadiness on feet: Secondary | ICD-10-CM | POA: Diagnosis not present

## 2016-07-31 DIAGNOSIS — G8929 Other chronic pain: Secondary | ICD-10-CM | POA: Diagnosis not present

## 2016-07-31 DIAGNOSIS — R293 Abnormal posture: Secondary | ICD-10-CM

## 2016-07-31 DIAGNOSIS — R2689 Other abnormalities of gait and mobility: Secondary | ICD-10-CM | POA: Diagnosis not present

## 2016-07-31 NOTE — Therapy (Signed)
[place], [place], [address] Phone: [phone]   Fax:  [phone]  Physical Therapy Treatment  Patient Details  Name: Emily Sweeney MRN: [id number] Date of Birth: 03-Apr-1941 Referring Provider: Dr. Alfonso Patten. Tat  Encounter Date: 07/31/2016      PT End of Session - 07/31/16 1314    Visit Number 10   Number of Visits 16   Date for PT Re-Evaluation 08/08/16   PT Start Time 1017   PT Stop Time 1100   PT Time Calculation (min) 43 min   Activity Tolerance Patient tolerated treatment well   Behavior During Therapy WFL for tasks assessed/performed      Past Medical History:  Diagnosis Date  . Anemia   . Blood transfusion without reported diagnosis   . Diverticulosis of colon   . GERD (gastroesophageal reflux disease)   . Hearing loss    right ear, and tinnitus  . Hyperlipidemia   . Hypertension   . Tubular adenoma 03/05/2015   Polyp, and Benign lymphoid polyp.    Past Surgical History:  Procedure Laterality Date  . APPENDECTOMY    . BREAST BIOPSY     benign  . BREAST LUMPECTOMY    . COCHLEAR IMPLANT    . CRANIECTOMY FOR EXCISION OF ACOUSTIC NEUROMA  2009  . NASAL SEPTUM SURGERY    . TONSILLECTOMY      There were no vitals filed for this visit.      Subjective Assessment - 07/31/16 1022    Subjective I was able to do a lot of physical activity over the weekend,  Flexion at the hips. Pain  increased in her leg.     Currently in Pain? Yes   Pain Score 3   more over the weekend   Pain Location Back   Pain Orientation Lower   Pain Descriptors / Indicators Aching   Pain Type Chronic pain   Pain Radiating Towards right leg   Pain Frequency Intermittent   Aggravating Factors  bending over to do things,  taking out the garbage,  garden   Pain Relieving Factors sit, rest, stretching   Effect of Pain on Daily Activities needs help getting things ready for Christmas   Multiple Pain Sites No                          [facility] Adult PT Treatment/Exercise - 07/31/16 0001      Self-Care   Self-Care ADL's  modifications reviewed and demonstrated to aviod Bending     Lumbar Exercises: Stretches   Double Knee to Chest Stretch --  10 X 5 seconds legs on ball   Lower Trunk Rotation --  10 X 5 second hold, legs on ball     Lumbar Exercises: Standing   Other Standing Lumbar Exercises hip hinge with cues,  heavy cues     Lumbar Exercises: Seated   Long Arc Quad on Chair 5 reps   Sit to Stand 5 reps  needs to use hands,  5 more reps with cues higher surface     Lumbar Exercises: Supine   Bridge 10 reps   Bridge Limitations feet on ball   Straight Leg Raise 5 reps  2 sets   Straight Leg Raises Limitations feet on ball   Large Ball Abdominal Isometric 10 reps   Large Ball Abdominal Isometric Limitations 5 seconds standing   Large Ball Oblique Isometric Limitations supine 5 X hip flexion  isometric     Knee/Hip Exercises: Standing   Stairs cues for technique   Gait Training cues for technique, trunk rotation to assist balance, use of arm swing to engage core.   Other Standing Knee Exercises hip hinge with cues.10 X fron 2 different surfaces.  Min assist for saftey looses balance backwards,     Knee/Hip Exercises: Seated   Sit to Sand 5 reps  23 seconds , minimum use of hands.                 PT Education - 07/31/16 1028    Education provided Yes   Education Details ADL posture   Person(s) Educated Patient   Methods Explanation;Demonstration;Verbal cues;Handout   Comprehension Verbalized understanding;Returned demonstration;Need further instruction          PT Short Term Goals - 07/26/16 0831      PT SHORT TERM GOAL #1   Title Pt will be I with HEP    Status Achieved     PT SHORT TERM GOAL #2   Title Pt will be able to sit to stand x 5 from chair in gym within 30 sec    Baseline 22 sec    Status Achieved           PT Long Term Goals  - 07/31/16 1319      PT LONG TERM GOAL #1   Title Pt will score less than 40% on FOTO   Time 8   Period Weeks   Status Unable to assess     PT LONG TERM GOAL #2   Title Berg balance score will improve to 46/56 or more to demo decreased fall risk    Time 8   Period Weeks   Status Unable to assess     PT LONG TERM GOAL #3   Title Pt will score 4+/5 or more in bilateral LEs for improved transfers, gait stability.   Time 8   Period Weeks   Status Unable to assess     PT LONG TERM GOAL #4   Title Pt will be able to stand, walk in the community for 30 min with min pain increase, relieved by rest.    Baseline able to walk around the block 3 x a day to walk a small dog.  I feel safe walking.   Time 8   Period Weeks   Status On-going     PT LONG TERM GOAL #5   Title Pt will walk with cane for improved balance and gait stability, use for DGI and improve to 17/24.    Baseline no cane   Time 8   Period Weeks   Status On-going               Plan - 07/31/16 1315    Clinical Impression Statement Patient continues to have balance issues requiring Close SBA for dynamis standing exercises.  She had intermittant knee pain on steps.  Patient had back and leg pain flare due to forward trunk bending and lifting incorrectly over the weekend.     PT Next Visit Plan continue mat HEP, functional strength , sit to stand , simple balance ex , modalities if needed, check gait , HIP HINGE , consider step ups to assist gait on steps.   PT Home Exercise Plan corner balance exercises, bridge and KTC and LTR   Consulted and Agree with Plan of Care Patient      Patient will benefit from skilled therapeutic intervention in order  to improve the following deficits and impairments:  Abnormal gait, Decreased coordination, Decreased range of motion, Difficulty walking, Increased fascial restricitons, Pain, Decreased balance, Decreased mobility, Decreased strength, Postural dysfunction  Visit  Diagnosis: Other abnormalities of gait and mobility  Muscle weakness (generalized)  Chronic right-sided low back pain with sciatica, sciatica laterality unspecified  Abnormal posture     Problem List Patient Active Problem List   Diagnosis Date Noted  . Prediabetes 07/20/2016  . Iron deficiency anemia 07/01/2016  . Chronic lumbar radiculopathy 06/17/2016  . Poor balance 05/24/2016  . At high risk for falls 05/24/2016  . Lumbosacral spondylosis without myelopathy 01/19/2016  . S/P excision of acoustic neuroma 01/19/2016  . Pruritus 01/19/2016  . Abdominal gas pain 01/19/2016  . B12 deficiency 10/17/2015  . Pernicious anemia 06/09/2015  . PVC (premature ventricular contraction) 02/25/2015  . Osteopenia, senile 01/12/2015  . Vitamin D deficiency 01/12/2015  . Nonspecific abnormal electrocardiogram (ECG) (EKG) 10/16/2014  . DIVERTICULITIS OF COLON 10/11/2009  . HYPERCHOLESTEROLEMIA, PURE 10/21/2008  . Essential hypertension 10/21/2008    Benz Vandenberghe PTA 07/31/2016, 1:21 PM  [facility] [address] [place], [place], [address] Phone: [phone]   Fax:  [phone]  Name: Emily Sweeney MRN: [id number] Date of Birth: 21-Oct-1940

## 2016-07-31 NOTE — Patient Instructions (Signed)

## 2016-08-02 ENCOUNTER — Ambulatory Visit: Payer: Medicare Other | Admitting: Physical Therapy

## 2016-08-02 ENCOUNTER — Ambulatory Visit: Payer: Medicare Other

## 2016-08-10 ENCOUNTER — Ambulatory Visit: Payer: Medicare Other | Admitting: Physical Therapy

## 2016-08-10 DIAGNOSIS — R2681 Unsteadiness on feet: Secondary | ICD-10-CM | POA: Diagnosis not present

## 2016-08-10 DIAGNOSIS — M6281 Muscle weakness (generalized): Secondary | ICD-10-CM | POA: Diagnosis not present

## 2016-08-10 DIAGNOSIS — R2689 Other abnormalities of gait and mobility: Secondary | ICD-10-CM | POA: Diagnosis not present

## 2016-08-10 DIAGNOSIS — R293 Abnormal posture: Secondary | ICD-10-CM | POA: Diagnosis not present

## 2016-08-10 DIAGNOSIS — M5440 Lumbago with sciatica, unspecified side: Secondary | ICD-10-CM

## 2016-08-10 DIAGNOSIS — G8929 Other chronic pain: Secondary | ICD-10-CM

## 2016-08-10 NOTE — Patient Instructions (Signed)
     Also gave her seated therapy ball ex: Alternating arms March  And supine bridge, hamstring curl and lower trunk rotation

## 2016-08-10 NOTE — Therapy (Signed)
[place], [place], [address] Phone: [phone]   Fax:  [phone]  Physical Therapy Treatment/Discharge   Patient Details  Name: Emily Sweeney MRN: [id number] Date of Birth: 08-13-1941 Referring Provider: Dr. Alfonso Patten. Tat  Encounter Date: 08/10/2016      PT End of Session - 08/10/16 1232    Visit Number 11   Number of Visits 16   Authorization Type GCODE DONE AT VISIT 8   PT Start Time 1145   PT Stop Time 1242   PT Time Calculation (min) 57 min   Activity Tolerance Patient tolerated treatment well   Behavior During Therapy WFL for tasks assessed/performed      Past Medical History:  Diagnosis Date  . Anemia   . Blood transfusion without reported diagnosis   . Diverticulosis of colon   . GERD (gastroesophageal reflux disease)   . Hearing loss    right ear, and tinnitus  . Hyperlipidemia   . Hypertension   . Tubular adenoma 03/05/2015   Polyp, and Benign lymphoid polyp.    Past Surgical History:  Procedure Laterality Date  . APPENDECTOMY    . BREAST BIOPSY     benign  . BREAST LUMPECTOMY    . COCHLEAR IMPLANT    . CRANIECTOMY FOR EXCISION OF ACOUSTIC NEUROMA  2009  . NASAL SEPTUM SURGERY    . TONSILLECTOMY      There were no vitals filed for this visit.      Subjective Assessment - 08/10/16 1152    Subjective L knee has been sore.  Its usually the Rt. thigh. Has had a fair amount of back pain for the holidays, could be from activity.  Not predictable.  Sees Neuro Rehab tomorrow. ESI on 1/18   Currently in Pain? Yes   Pain Score 3    Pain Location Back   Pain Orientation Lower   Pain Descriptors / Indicators Aching   Pain Type Chronic pain   Pain Radiating Towards Rt. LE    Pain Onset More than a month ago   Pain Frequency Intermittent   Aggravating Factors  not predictable, usually overactivity    Pain Relieving Factors sit, rest, heat, stretching             [facility] PT  Assessment - 08/10/16 1157      Observation/Other Assessments   Focus on Therapeutic Outcomes (FOTO)  48%     Strength   Right Hip Flexion 3/5   Left Hip Flexion 3/5   Right Knee Flexion 4/5   Right Knee Extension 4/5   Left Knee Flexion 4+/5   Left Knee Extension 5/5   Right Ankle Dorsiflexion 5/5   Left Ankle Dorsiflexion 5/5     Berg Balance Test   Berg comment: deferred as patient is having Neuro PT eval                      [facility] Adult PT Treatment/Exercise - 08/10/16 0001      Lumbar Exercises: Stretches   Active Hamstring Stretch 2 reps;30 seconds     Lumbar Exercises: Aerobic   Stationary Bike NuStep L4 UE and LE for 6 min      Lumbar Exercises: Seated   Hip Flexion on Ball Strengthening;Both;10 reps   Hip Flexion on Ball Limitations red band    Sit to Stand 5 reps   Other Seated Lumbar Exercises Ball ex : arm lift x 10    Other  Seated Lumbar Exercises seated clam red x 20      Lumbar Exercises: Supine   Bridge 10 reps   Bridge Limitations feet on ball pain in Rt. ant thigh    Other Supine Lumbar Exercises hamstring on ball, x 10 and LTR wth ball x 10      Knee/Hip Exercises: Seated   Sit to Sand 5 reps;with UE support  x 2 needs cues for hinge      Moist Heat Therapy   Number Minutes Moist Heat 10 Minutes   Moist Heat Location Lumbar Spine                PT Education - 08/10/16 1235    Education provided Yes   Education Details DC., HEP, seated and supine ball progressions , sit to stand hip hinge.    Person(s) Educated Patient   Methods Explanation   Comprehension Verbalized understanding;Returned demonstration          PT Short Term Goals - 07/26/16 0831      PT SHORT TERM GOAL #1   Title Pt will be I with HEP    Status Achieved     PT SHORT TERM GOAL #2   Title Pt will be able to sit to stand x 5 from chair in gym within 30 sec    Baseline 22 sec    Status Achieved           PT Long Term Goals - 08/10/16 1155       PT LONG TERM GOAL #1   Title Pt will score less than 40% on FOTO   Status Not Met     PT LONG TERM GOAL #2   Title Berg balance score will improve to 46/56 or more to demo decreased fall risk    Status Deferred     PT LONG TERM GOAL #3   Title Pt will score 4+/5 or more in bilateral LEs for improved transfers, gait stability.   Status Not Met     PT LONG TERM GOAL #4   Title Pt will be able to stand, walk in the community for 30 min with min pain increase, relieved by rest.    Baseline usually can do this    Status Partially Met     PT LONG TERM GOAL #5   Title Pt will walk with cane for improved balance and gait stability, use for DGI and improve to 17/24.    Status Unable to assess               Plan - 08/10/16 1254    Clinical Impression Statement Patient cont to have balance concerns, back pain is intermittnet and can be mod to severe at times.  Her FOTO score improved minimally (55% to 48%) but she has developed and HEP which usually decreases her pain.  She cont to have LE weakness, added in some new exercises which she can do on her stability ball which she received for Christmas.  She will benefit from Neuro PT eval to address ataxia, cont balance work.    PT Next Visit Plan Dc from Ortho PT    PT Home Exercise Plan corner balance exercises, bridge and KTC and LTR, marching , clam with red band and seated core ball ex/    Consulted and Agree with Plan of Care Patient      Patient will benefit from skilled therapeutic intervention in order to improve the following deficits and impairments:  Abnormal gait, Decreased  coordination, Decreased range of motion, Difficulty walking, Increased fascial restricitons, Pain, Decreased balance, Decreased mobility, Decreased strength, Postural dysfunction  Visit Diagnosis: Other abnormalities of gait and mobility  Chronic right-sided low back pain with sciatica, sciatica laterality unspecified  Abnormal posture  Muscle  weakness (generalized)       G-Codes - 08-25-16 1256    Functional Assessment Tool Used FOTO   Functional Limitation Mobility: Walking and moving around   Mobility: Walking and Moving Around Current Status [phone]) At least 40 percent but less than 60 percent impaired, limited or restricted   Mobility: Walking and Moving Around Goal Status [phone]) At least 20 percent but less than 40 percent impaired, limited or restricted   Mobility: Walking and Moving Around Discharge Status [phone]) At least 40 percent but less than 60 percent impaired, limited or restricted      Problem List Patient Active Problem List   Diagnosis Date Noted  . Prediabetes 07/20/2016  . Iron deficiency anemia 07/01/2016  . Chronic lumbar radiculopathy 06/17/2016  . Poor balance 05/24/2016  . At high risk for falls 05/24/2016  . Lumbosacral spondylosis without myelopathy 01/19/2016  . S/P excision of acoustic neuroma 01/19/2016  . Pruritus 01/19/2016  . Abdominal gas pain 01/19/2016  . B12 deficiency 10/17/2015  . Pernicious anemia 06/09/2015  . PVC (premature ventricular contraction) 02/25/2015  . Osteopenia, senile 01/12/2015  . Vitamin D deficiency 01/12/2015  . Nonspecific abnormal electrocardiogram (ECG) (EKG) 10/16/2014  . DIVERTICULITIS OF COLON 10/11/2009  . HYPERCHOLESTEROLEMIA, PURE 10/21/2008  . Essential hypertension 10/21/2008    Lin Glazier Aug 25, 2016, 1:00 PM  [facility] [address] [place], [place], [address] Phone: [phone]   Fax:  [phone]  Name: Emily Sweeney MRN: [id number] Date of Birth: 07-31-1941  PHYSICAL THERAPY DISCHARGE SUMMARY  Visits from Start of Care: 11  Current functional level related to goals / functional outcomes: See above    Remaining deficits: Core strength, balance and pain, LE strength    Education / Equipment: Balance , HEP, posture and lifting , body mech.  Plan: Patient agrees to  discharge.  Patient goals were partially met. Patient is being discharged due to lack of progress.  ?????  Going to Neurorehab.    Raeford Razor, PT 08-25-2016 1:01 PM Phone: [phone] Fax: [phone]

## 2016-08-11 ENCOUNTER — Ambulatory Visit: Payer: Medicare Other | Admitting: Physical Therapy

## 2016-08-11 DIAGNOSIS — R2689 Other abnormalities of gait and mobility: Secondary | ICD-10-CM | POA: Diagnosis not present

## 2016-08-11 DIAGNOSIS — R2681 Unsteadiness on feet: Secondary | ICD-10-CM | POA: Diagnosis not present

## 2016-08-11 DIAGNOSIS — G8929 Other chronic pain: Secondary | ICD-10-CM | POA: Diagnosis not present

## 2016-08-11 DIAGNOSIS — R293 Abnormal posture: Secondary | ICD-10-CM | POA: Diagnosis not present

## 2016-08-11 DIAGNOSIS — M6281 Muscle weakness (generalized): Secondary | ICD-10-CM | POA: Diagnosis not present

## 2016-08-11 DIAGNOSIS — M5440 Lumbago with sciatica, unspecified side: Secondary | ICD-10-CM | POA: Diagnosis not present

## 2016-08-11 NOTE — Therapy (Addendum)
[facility] [address] [place], [place], [address] Phone: [phone]   Fax:  [phone]  Physical Therapy Evaluation  Patient Details  Name: Emily Sweeney MRN: [id number] Date of Birth: 06-30-1941 Referring Provider: Billey Gosling, MD  Encounter Date: 08/11/2016      PT End of Session - 08/11/16 0939    Visit Number 1  New episode of care   Number of Visits 13   Date for PT Re-Evaluation 10/11/16   Authorization Type Medicare Traditional/[employer]-GCODE due every 10th visit   PT Start Time 0847   PT Stop Time 0932   PT Time Calculation (min) 45 min   Activity Tolerance Patient tolerated treatment well   Behavior During Therapy [facility] for tasks assessed/performed      Past Medical History:  Diagnosis Date  . Anemia   . Blood transfusion without reported diagnosis   . Diverticulosis of colon   . GERD (gastroesophageal reflux disease)   . Hearing loss    right ear, and tinnitus  . Hyperlipidemia   . Hypertension   . Tubular adenoma 03/05/2015   Polyp, and Benign lymphoid polyp.    Past Surgical History:  Procedure Laterality Date  . APPENDECTOMY    . BREAST BIOPSY     benign  . BREAST LUMPECTOMY    . COCHLEAR IMPLANT    . CRANIECTOMY FOR EXCISION OF ACOUSTIC NEUROMA  2009  . NASAL SEPTUM SURGERY    . TONSILLECTOMY      There were no vitals filed for this visit.       Subjective Assessment - 08/11/16 0852    Subjective Pt reports back pain is more intermittent at this point.  She has been seeing ortho PT to manage back pain.  Reports difficulty with balance-went to see family in [place] and did a lot outdoors, where I had "awful balance."  Looking up and reaching makes me feel off balance.  Reports one controlled fall upon bumping into a corner with a turn.   Pertinent History lumbar spondylosis, osteopenia, acoustic neuroma on R side 2009   Patient Stated Goals Pt would like to be able to improve  balance.   Currently in Pain? No/denies  No pain at eval   Aggravating Factors  not predictable, usually overactivity   Pain Relieving Factors exercises, Aleve, good positioning            [facility] PT Assessment - 08/11/16 0001      Assessment   Medical Diagnosis poor balance, chronic lumbar radiculopathy   Referring Provider Billey Gosling, MD   Onset Date/Surgical Date 07/20/16  MD visit   Prior Therapy d/c PT for back pain on 08/10/16     Precautions   Precautions Fall     Balance Screen   Has the patient fallen in the past 6 months Yes   How many times? 1, controlled fall   Has the patient had a decrease in activity level because of a fear of falling?  Yes   Is the patient reluctant to leave their home because of a fear of falling?  No     Home Environment   Living Environment Private residence   Living Arrangements Alone   Type of [place] to enter   Entrance Stairs-Number of Steps 12   Entrance Stairs-Rails Right   Home Layout Two level   Additional Comments Has a dog, which she walks daily.  Describes difficulty on unlevel surfaces  Prior Function   Level of Independence Independent;Needs assistance with homemaking   Vocation Full time employment   Vocation Requirements psychologist   Leisure family, reading      Observation/Other Assessments   Focus on Therapeutic Outcomes (FOTO)  Functional Status Intake score 55%; ABC scale score 28.8%     Posture/Postural Control   Posture/Postural Control Postural limitations   Postural Limitations Rounded Shoulders;Forward head   Posture Comments wide BOS     ROM / Strength   AROM / PROM / Strength Strength     Strength   Overall Strength Comments Strength deficits noted per MMT 07/31/16 by Reynold Bowen, PT at discharge from ortho PT     Ambulation/Gait   Ambulation/Gait Yes   Ambulation/Gait Assistance 6: Modified independent (Device/Increase time)   Ambulation Distance (Feet) 200 Feet    Assistive device None   Gait Pattern Right flexed knee in stance;Left flexed knee in stance;Ataxic;Trunk rotated posteriorly on right;Wide base of support   Gait velocity 14.65 sec = 2.24 ft/sec     High Level Balance   High Level Balance Comments Standing on foam EO:  30 seconds, EC:  5 seconds     Standardized Balance Assessment   Standardized Balance Assessment Timed Up and Go Test;Dynamic Gait Index;Berg Balance Test     Berg Balance Test   Sit to Stand Able to stand  independently using hands   Standing Unsupported Able to stand safely 2 minutes   Sitting with Back Unsupported but Feet Supported on Floor or Stool Able to sit safely and securely 2 minutes   Stand to Sit Controls descent by using hands   Transfers Able to transfer safely, definite need of hands   Standing Unsupported with Eyes Closed Unable to keep eyes closed 3 seconds but stays steady   Standing Ubsupported with Feet Together Able to place feet together independently and stand 1 minute safely   From Standing, Reach Forward with Outstretched Arm Can reach forward >12 cm safely (5")   From Standing Position, Pick up Object from Floor Able to pick up shoe, needs supervision   From Standing Position, Turn to Look Behind Over each Shoulder Turn sideways only but maintains balance   Turn 360 Degrees Able to turn 360 degrees safely but slowly   Standing Unsupported, Alternately Place Feet on Step/Stool Able to complete 4 steps without aid or supervision   Standing Unsupported, One Foot in Front Able to take small step independently and hold 30 seconds   Standing on One Leg Tries to lift leg/unable to hold 3 seconds but remains standing independently   Total Score 37   Berg comment: Scores <45/56 indicate increased fall risk.     Dynamic Gait Index   Level Surface Mild Impairment   Change in Gait Speed Moderate Impairment   Gait with Horizontal Head Turns Moderate Impairment   Gait with Vertical Head Turns Severe  Impairment   Gait and Pivot Turn Moderate Impairment  3.67   Step Over Obstacle Moderate Impairment   Step Around Obstacles Moderate Impairment   Steps Mild Impairment   Total Score 9   DGI comment: Scores <19/24 are indicative of increased fall risk     Timed Up and Go Test   Normal TUG (seconds) 19.41  17.07   TUG Comments Scores >13.5 sec are indicative of increased fall risk.  PT Short Term Goals - 08/11/16 0949      PT SHORT TERM GOAL #1   Title Pt will be independent with HEP to address balance and dynamic gait.  TARGET 08/25/16   Time 2   Period Weeks   Status New     PT SHORT TERM GOAL #2   Title Pt will improve TUG score to less than or equal to 15 seconds for decreased fall risk.   Time 2   Period Weeks   Status New     PT SHORT TERM GOAL #3   Title Pt will verbalize understanding of fall prevention in the home environment.   Time 2   Period Weeks   Status New           PT Long Term Goals - 08/11/16 0950      PT LONG TERM GOAL #1   Title Pt will improve Berg score to at least 42/56 for decreased fall risk.  TARGET 09/22/16   Time 6   Period Weeks   Status New     PT LONG TERM GOAL #2   Title Pt will improve Dynamic Gait Index score to at least 15/24 for decreased fall risk.   Time 6   Period Weeks   Status New     PT LONG TERM GOAL #3   Title Pt will imrpove gait velocity score to at least 2.62 ft/sec for improved gait efficiency and safety.   Time 6   Period Weeks   Status New     PT LONG TERM GOAL #4   Title Sensory Organization test to be performed, with goal to be written as appropriate.   Time 6   Period Weeks   Status New     PT LONG TERM GOAL #5   Title Pt will improve ABC scale score on FOTO by at least 20% for improved balance confidence.   Time 6   Period Weeks   Status New               Plan - 08/11/16 0943    Clinical Impression Statement Pt is a 75 year old female who   presents to OP Neuro PT to address balance issues.  Pt has been previously seen by ortho PT for back pain (discharged 08/10/16), which patient reports pain has resolved to being only intermittent, manageable with medication and exercises.  Pt has had one fall in the past 6 months, and reports difficulty with balance on unlevel surfaces, dark areas, and would like to improve overall balance.  ABC scale score is 28.8%; pt is at fall risk per Berg, TUG, DGI scores.  Pt has history of acoustic neuroma, and she demonstrates decreased vestibular system use for balance (as noted by difficulty maintaining balance EC on foam).  Pt will benefit from skilled physical therapy to address balance and dynamic gait for improved functional mobility and decreased fall risk.   Rehab Potential Good   Clinical Impairments Affecting Rehab Potential History of R acoustic neuroma   PT Frequency 2x / week   PT Duration 6 weeks  plus eval   PT Treatment/Interventions ADLs/Self Care Home Management;Functional mobility training;Gait training;Therapeutic activities;Therapeutic exercise;Balance training;Neuromuscular re-education;Patient/family education   PT Next Visit Plan Sensory Organization test; initiate corner balance exercises for compliant surface activities    Consulted and Agree with Plan of Care Patient      Patient will benefit from skilled therapeutic intervention in order to improve the following deficits and impairments:  Abnormal gait, Decreased balance, Decreased strength, Difficulty walking, Postural dysfunction, Decreased mobility  Visit Diagnosis: Other abnormalities of gait and mobility  Unsteadiness on feet  Abnormal posture  Muscle weakness (generalized)      G-Codes - 08/12/2016 0958    Functional Assessment Tool Used ABC scale score 28.8%, TUG 19.41 sec, gait velocity 2.24 ft/sec, DGI 9/24, Berg 37/56   Functional Limitation Mobility: Walking and moving around   Mobility: Walking and Moving  Around Current Status [phone]) At least 40 percent but less than 60 percent impaired, limited or restricted   Mobility: Walking and Moving Around Goal Status [phone]) At least 20 percent but less than 40 percent impaired, limited or restricted       Problem List Patient Active Problem List   Diagnosis Date Noted  . Prediabetes 07/20/2016  . Iron deficiency anemia 07/01/2016  . Chronic lumbar radiculopathy 06/17/2016  . Poor balance 05/24/2016  . At high risk for falls 05/24/2016  . Lumbosacral spondylosis without myelopathy 01/19/2016  . S/P excision of acoustic neuroma 01/19/2016  . Pruritus 01/19/2016  . Abdominal gas pain 01/19/2016  . B12 deficiency 10/17/2015  . Pernicious anemia 06/09/2015  . PVC (premature ventricular contraction) 02/25/2015  . Osteopenia, senile 01/12/2015  . Vitamin D deficiency 01/12/2015  . Nonspecific abnormal electrocardiogram (ECG) (EKG) 10/16/2014  . DIVERTICULITIS OF COLON 10/11/2009  . HYPERCHOLESTEROLEMIA, PURE 10/21/2008  . Essential hypertension 10/21/2008    Santos Sollenberger W. Aug 12, 2016, 10:01 AM  [facility]razier Butt., PT [facility] [address] [place], [place], [address] Phone: [phone]   Fax:  [phone]  Name: Emily Sweeney MRN: [id number] Date of Birth: 1940-08-30

## 2016-08-16 ENCOUNTER — Ambulatory Visit: Payer: Medicare Other | Admitting: Physical Therapy

## 2016-08-18 ENCOUNTER — Telehealth: Payer: Medicare Other | Admitting: Family

## 2016-08-18 DIAGNOSIS — J208 Acute bronchitis due to other specified organisms: Secondary | ICD-10-CM

## 2016-08-18 MED ORDER — BENZONATATE 100 MG PO CAPS: 100.0000 mg | ORAL_CAPSULE | Freq: Two times a day (BID) | ORAL | 0 refills | Status: DC | PRN

## 2016-08-18 NOTE — Progress Notes (Signed)
We are sorry that you are not feeling well.  Here is how we plan to help!  Based on what you have shared with me it looks like you have upper respiratory tract inflammation that has resulted in a significant cough.  Inflammation and infection in the upper respiratory tract is commonly called bronchitis and has four common causes:  Allergies, Viral Infections, Acid Reflux and Bacterial Infections.  Allergies, viruses and acid reflux are treated by controlling symptoms or eliminating the cause. An example might be a cough caused by taking certain blood pressure medications. You stop the cough by changing the medication. Another example might be a cough caused by acid reflux. Controlling the reflux helps control the cough.  Based on your presentation I believe you most likely have A cough due to a virus.  This is called viral bronchitis and is best treated by rest, plenty of fluids and control of the cough.  You may use Ibuprofen or Tylenol as directed to help your symptoms.     In addition you may use A non-prescription cough medication called Robitussin DAC. Take 2 teaspoons every 8 hours or Delsym: take 2 teaspoons every 12 hours., A non-prescription cough medication called Mucinex DM: take 2 tablets every 12 hours. and A prescription cough medication called Tessalon Perles 100mg. You may take 1-2 capsules every 8 hours as needed for your cough.   USE OF BRONCHODILATOR ("RESCUE") INHALERS: There is a risk from using your bronchodilator too frequently.  The risk is that over-reliance on a medication which only relaxes the muscles surrounding the breathing tubes can reduce the effectiveness of medications prescribed to reduce swelling and congestion of the tubes themselves.  Although you feel brief relief from the bronchodilator inhaler, your asthma may actually be worsening with the tubes becoming more swollen and filled with mucus.  This can delay other crucial treatments, such as oral steroid medications.  If you need to use a bronchodilator inhaler daily, several times per day, you should discuss this with your provider.  There are probably better treatments that could be used to keep your asthma under control.     HOME CARE . Only take medications as instructed by your medical team. . Complete the entire course of an antibiotic. . Drink plenty of fluids and get plenty of rest. . Avoid close contacts especially the very young and the elderly . Cover your mouth if you cough or cough into your sleeve. . Always remember to wash your hands . A steam or ultrasonic humidifier can help congestion.   GET HELP RIGHT AWAY IF: . You develop worsening fever. . You become short of breath . You cough up blood. . Your symptoms persist after you have completed your treatment plan MAKE SURE YOU   Understand these instructions.  Will watch your condition.  Will get help right away if you are not doing well or get worse.  Your e-visit answers were reviewed by a board certified advanced clinical practitioner to complete your personal care plan.  Depending on the condition, your plan could have included both over the counter or prescription medications. If there is a problem please reply  once you have received a response from your provider. Your safety is important to us.  If you have drug allergies check your prescription carefully.    You can use MyChart to ask questions about today's visit, request a non-urgent call back, or ask for a work or school excuse for 24 hours related to this e-Visit.   If it has been greater than 24 hours you will need to follow up with your provider, or enter a new e-Visit to address those concerns. You will get an e-mail in the next two days asking about your experience.  I hope that your e-visit has been valuable and will speed your recovery. Thank you for using e-visits.   

## 2016-08-21 ENCOUNTER — Ambulatory Visit (INDEPENDENT_AMBULATORY_CARE_PROVIDER_SITE_OTHER): Payer: Medicare Other

## 2016-08-21 DIAGNOSIS — E538 Deficiency of other specified B group vitamins: Secondary | ICD-10-CM

## 2016-08-21 MED ORDER — CYANOCOBALAMIN 1000 MCG/ML IJ SOLN
1000.0000 ug | Freq: Once | INTRAMUSCULAR | Status: AC
Start: 2016-08-21 — End: 2016-08-21
  Administered 2016-08-21: 1000 ug via INTRAMUSCULAR

## 2016-08-21 NOTE — Progress Notes (Signed)
Injection given.   Stacy J Burns, MD  

## 2016-08-22 ENCOUNTER — Ambulatory Visit: Payer: Medicare Other | Attending: Internal Medicine | Admitting: Physical Therapy

## 2016-08-22 DIAGNOSIS — R2689 Other abnormalities of gait and mobility: Secondary | ICD-10-CM | POA: Diagnosis not present

## 2016-08-22 DIAGNOSIS — M6281 Muscle weakness (generalized): Secondary | ICD-10-CM | POA: Insufficient documentation

## 2016-08-22 DIAGNOSIS — R2681 Unsteadiness on feet: Secondary | ICD-10-CM | POA: Diagnosis not present

## 2016-08-22 NOTE — Therapy (Signed)
[facility] [address] [place], [place], [address] Phone: [phone]   Fax:  [phone]  Physical Therapy Treatment  Patient Details  Name: Emily Sweeney MRN: [id number] Date of Birth: [birth year] Referring Provider: Billey Gosling, MD  Encounter Date: 08/22/2016      PT End of Session - 08/22/16 0959    Visit Number 2  New episode of care   Number of Visits 13   Date for PT Re-Evaluation 10/11/16   Authorization Type Medicare Traditional/AARP-GCODE due every 10th visit   PT Start Time 0806   PT Stop Time 0902   PT Time Calculation (min) 56 min   Equipment Utilized During Treatment Gait belt   Activity Tolerance Patient tolerated treatment well;No increased pain   Behavior During Therapy WFL for tasks assessed/performed      Past Medical History:  Diagnosis Date  . Anemia   . Blood transfusion without reported diagnosis   . Diverticulosis of colon   . GERD (gastroesophageal reflux disease)   . Hearing loss    right ear, and tinnitus  . Hyperlipidemia   . Hypertension   . Tubular adenoma 03/05/2015   Polyp, and Benign lymphoid polyp.    Past Surgical History:  Procedure Laterality Date  . APPENDECTOMY    . BREAST BIOPSY     benign  . BREAST LUMPECTOMY    . COCHLEAR IMPLANT    . CRANIECTOMY FOR EXCISION OF ACOUSTIC NEUROMA  2009  . NASAL SEPTUM SURGERY    . TONSILLECTOMY      There were no vitals filed for this visit.      Subjective Assessment - 08/22/16 0811    Subjective Back pain and Rt thigh pain still come and go. Reports she is to have epidural injection 08/31/16. She had one in 2014 that greatly helped. Last week had cough and congestion,including both ears and she felt her balance was further effected during this time. Reports she walks her dog (a Yorkie) around the block 3x/day.   Pertinent History lumbar spondylosis, osteopenia, acoustic neuroma on R side 2010 (pt corrected was not 2009  as previously reported)   Patient Stated Goals Pt would like to be able to improve balance.   Currently in Pain? No/denies                         [facility] Adult PT Treatment/Exercise - 08/22/16 0925      Transfers   Transfers Sit to Stand;Stand Pivot Transfers   Sit to Stand 6: Modified independent (Device/Increase time)   Stand Pivot Transfers 6: Modified independent (Device/Increase time)   Comments pt moves slowly, cautiously with wide BOS     Ambulation/Gait   Ambulation/Gait Yes   Ambulation/Gait Assistance 6: Modified independent (Device/Increase time)   Ambulation Distance (Feet) 330 Feet   Assistive device None   Gait Pattern Step-through pattern;Decreased arm swing - right;Decreased arm swing - left;Decreased step length - right;Decreased step length - left;Wide base of support;Decreased trunk rotation   Ambulation Surface Level;Indoor     Posture/Postural Control   Posture/Postural Control Postural limitations   Postural Limitations Rounded Shoulders;Forward head   Posture Comments wide BOS     Balance   Balance Assessed Yes     Dynamic Standing Balance   Step ups comments: 1step x 10 holding bil rails; leading and lowering with RLE for strengthening; cues for slower speed to emphasize strengthening and balance; after 4 reps pt with RLE  weakness noted by less controlled descent      High Level Balance   High Level Balance Activities Head turns;Turns;Sudden stops   High Level Balance Comments Ambulating x 34ft x 2 with horizontal head turns. Pt with LOB 90% of lt head turns (pt able to self-recover without external support 90% of occurences); rt head turns with LOB 50% of time with self-recover 100% of time; uses wide base of support, stops, or steps     Knee/Hip Exercises: Standing   Heel Raises Both;10 reps   Heel Raises Limitations facing counter with 2 finger support bil hands; emphasize slow controlled movement   Hip Flexion AROM;Both;10 reps;2 sets    Hip Flexion Limitations marching alternating legs; single UE support at counter; second set with 2 sec hold when leg lifted   Hip Abduction AROM;Stengthening;Both;1 set;10 reps   Abduction Limitations facing counter; 2 finger support bil hands emphasizing slower speed for balance/control                PT Education - 08/22/16 0957    Education provided Yes   Education Details See HEP above; emphasized using UE support for safety. Patient reports corner she normally uses for balance exercises currently has Christmas tree set up.   Person(s) Educated Patient   Methods Explanation;Demonstration;Verbal cues;Handout   Comprehension Returned demonstration;Verbalized understanding;Verbal cues required;Need further instruction          PT Short Term Goals - 08/22/16 1009      PT SHORT TERM GOAL #1   Title Pt will be independent with HEP to address balance and dynamic gait.  TARGET 08/25/16   Time 2   Period Weeks   Status New     PT SHORT TERM GOAL #2   Title Pt will improve TUG score to less than or equal to 15 seconds for decreased fall risk.   Time 2   Period Weeks   Status New     PT SHORT TERM GOAL #3   Title Pt will verbalize understanding of fall prevention in the home environment.   Time 2   Period Weeks   Status New           PT Long Term Goals - 08/11/16 0950      PT LONG TERM GOAL #1   Title Pt will improve Berg score to at least 42/56 for decreased fall risk.  TARGET 09/22/16   Time 6   Period Weeks   Status New     PT LONG TERM GOAL #2   Title Pt will improve Dynamic Gait Index score to at least 15/24 for decreased fall risk.   Time 6   Period Weeks   Status New     PT LONG TERM GOAL #3   Title Pt will imrpove gait velocity score to at least 2.62 ft/sec for improved gait efficiency and safety.   Time 6   Period Weeks   Status New     PT LONG TERM GOAL #4   Title Sensory Organization test to be performed, with goal to be written as  appropriate.   Time 6   Period Weeks   Status New     PT LONG TERM GOAL #5   Title Pt will improve ABC scale score on FOTO by at least 20% for improved balance confidence.   Time 6   Period Weeks   Status New               Plan - 08/22/16 1000  Clinical Impression Statement Patient reports her balance has worsened over the past week due to cough combined with bil ear congestion/fullness. She denies use of cane (does not own one) "although that has been mentioned before." She is clearly guarded, cautious with her gait (limited arm swing, trunk rotation, & wide base of support). She was noted to have decreased core stabilization and hip stabilization during balance and strengthening exercises and was issued a HEP to begin to address these deficits. Walking with head turns caused multiple losses of balance (mostly pt self-corrected, however with occasional min assist). Patient will continue to benefit from balance and gait training to decrease her fall risk.    Rehab Potential Good   Clinical Impairments Affecting Rehab Potential History of R acoustic neuroma; surgically removed with "nerve severed"   PT Frequency 2x / week   PT Duration 6 weeks  plus eval   PT Treatment/Interventions ADLs/Self Care Home Management;Functional mobility training;Gait training;Therapeutic activities;Therapeutic exercise;Balance training;Neuromuscular re-education;Patient/family education;DME Instruction   PT Next Visit Plan Sensory Organization test; initiate corner balance exercises for compliant surface activities    PT Home Exercise Plan see pt instructions for current HEP. Pt also continues to do seated clam exercise with red band   Consulted and Agree with Plan of Care Patient      Patient will benefit from skilled therapeutic intervention in order to improve the following deficits and impairments:  Abnormal gait, Decreased balance, Decreased strength, Difficulty walking, Postural dysfunction,  Decreased mobility, Decreased knowledge of use of DME  Visit Diagnosis: Unsteadiness on feet  Muscle weakness (generalized)  Other abnormalities of gait and mobility     Problem List Patient Active Problem List   Diagnosis Date Noted  . Prediabetes 07/20/2016  . Iron deficiency anemia 07/01/2016  . Chronic lumbar radiculopathy 06/17/2016  . Poor balance 05/24/2016  . At high risk for falls 05/24/2016  . Lumbosacral spondylosis without myelopathy 01/19/2016  . S/P excision of acoustic neuroma 01/19/2016  . Pruritus 01/19/2016  . Abdominal gas pain 01/19/2016  . B12 deficiency 10/17/2015  . Pernicious anemia 06/09/2015  . PVC (premature ventricular contraction) 02/25/2015  . Osteopenia, senile 01/12/2015  . Vitamin D deficiency 01/12/2015  . Nonspecific abnormal electrocardiogram (ECG) (EKG) 10/16/2014  . DIVERTICULITIS OF COLON 10/11/2009  . HYPERCHOLESTEROLEMIA, PURE 10/21/2008  . Essential hypertension 10/21/2008    Rexanne Mano, PT 08/22/2016, 1:10 PM  [facility] [address] [place], [place], [address] Phone: [phone]   Fax:  [phone]  Name: Londynn Willamson MRN: [id number] Date of Birth: 01-Dec-1940

## 2016-08-22 NOTE — Patient Instructions (Addendum)
Heel Raises    Stand with both hands minimally supported on counter. With knees straight, raise heels off ground. Hold __1_ seconds. Slowly lower heels to ground. Repeat __10_ times. Do __1_ times a day.   Copyright  VHI. All rights reserved.  FUNCTIONAL MOBILITY: Marching - Standing    March in place by lifting left leg up, hold for 2 seconds. Alternate by lifting right leg, hold for 2 seconds. _10__ reps (each leg) per set, _1__ sets per day, _7__ days per week, Lightly hold onto a support with one hand.    Copyright  VHI. All rights reserved.  Step-Down / Step-Up    Stand at bottom step, lightly holding both rails. Slowly step up with right leg, then step left foot on same step. Step down/backwards with Left leg, using right leg to slowly lower to start position Repeat __10__ times per set. Do __1__ sets per session. Do __1__ sessions per day.  http://orth.exer.us/684   Copyright  VHI. All rights reserved.  ABDUCTION: Standing (Active)    Stand, feet flat, facing counter with both hands lightly supporting (i.e. 2 fingers). Slowly lift right leg out to side. Be sure foot and knee face forwards. Return to start position and alternately lift left leg.  Complete __1_ sets of _10__ repetitions. Perform _1__ sessions per day.  http://gtsc.exer.us/111   Copyright  VHI. All rights reserved.  Gymball: Bridging (Double Leg)    Lie on back, calves on ball. Slowly raise and lower buttocks. Minimize use of arms to steady yourself.  Repeat _10__ times per set. Do _1__ sets per session.  http://plyo.exer.us/165   Copyright  VHI. All rights reserved.

## 2016-08-24 ENCOUNTER — Ambulatory Visit: Payer: Medicare Other | Admitting: Physical Therapy

## 2016-08-24 DIAGNOSIS — R2689 Other abnormalities of gait and mobility: Secondary | ICD-10-CM | POA: Diagnosis not present

## 2016-08-24 DIAGNOSIS — R2681 Unsteadiness on feet: Secondary | ICD-10-CM

## 2016-08-24 DIAGNOSIS — M6281 Muscle weakness (generalized): Secondary | ICD-10-CM | POA: Diagnosis not present

## 2016-08-24 NOTE — Therapy (Signed)
[facility] [address] [place], [place], [address] Phone: [phone]   Fax:  [phone]  Physical Therapy Treatment  Patient Details  Name: Emily Sweeney MRN: [id number] Date of Birth: [birth year] Referring Provider: Billey Gosling, MD  Encounter Date: 08/24/2016      PT End of Session - 08/24/16 0928    Visit Number 3  New episode of care   Number of Visits 13   Date for PT Re-Evaluation 10/11/16   Authorization Type Medicare Traditional/AARP-GCODE due every 10th visit   PT Start Time 0815   PT Stop Time 0849   PT Time Calculation (min) 34 min   Equipment Utilized During Treatment Other (comment)   Activity Tolerance Patient tolerated treatment well   Behavior During Therapy [facility] for tasks assessed/performed      Past Medical History:  Diagnosis Date  . Anemia   . Blood transfusion without reported diagnosis   . Diverticulosis of colon   . GERD (gastroesophageal reflux disease)   . Hearing loss    right ear, and tinnitus  . Hyperlipidemia   . Hypertension   . Tubular adenoma 03/05/2015   Polyp, and Benign lymphoid polyp.    Past Surgical History:  Procedure Laterality Date  . APPENDECTOMY    . BREAST BIOPSY     benign  . BREAST LUMPECTOMY    . COCHLEAR IMPLANT    . CRANIECTOMY FOR EXCISION OF ACOUSTIC NEUROMA  2009  . NASAL SEPTUM SURGERY    . TONSILLECTOMY      There were no vitals filed for this visit.      Subjective Assessment - 08/24/16 0853    Subjective Patient reports increased back and Rt posterior neck pain since last visit. She feels the bridging exercise on the ball is what made her feel worse (although she notes it was an exercise she was given during her ortho OPPT sessions for her back pain). ? if not using her arms to help her stabilize (as instructed last visit) may have incr back pain. Reports no problems or questions re: recent additions to HEP in standing.   Pertinent  History lumbar spondylosis, osteopenia, acoustic neuroma on R side 2010 (pt corrected was not 2009 as previously reported)   Limitations Standing;Walking;House hold activities   Patient Stated Goals Pt would like to be able to improve balance.   Currently in Pain? Yes   Pain Score 2    Pain Location Neck   Pain Orientation Posterior;Right   Pain Descriptors / Indicators Tightness   Pain Type Acute pain   Pain Onset In the past 7 days   Pain Frequency Intermittent   Aggravating Factors  neck flexion and lt lat flexion   Multiple Pain Sites No                Vestibular Assessment - 08/24/16 0904      Balancemaster   Balancemaster Sensory organization test   Balancemaster Comment Pt's composite score: 22/100  Somatosensory ~82/100, Vision ~20/100, Vestibular ~1/100. Patient with h/o acoustic neuroma                 [facility] Adult PT Treatment/Exercise - 08/24/16 0900      Transfers   Transfers Sit to Stand;Stand to Sit   Sit to Stand 6: Modified independent (Device/Increase time)   Stand to Sit 6: Modified independent (Device/Increase time)   Comments chair with armrests, she uses bil UE support to ascend      Ambulation/Gait  Ambulation/Gait Assistance 6: Modified independent (Device/Increase time)   Ambulation Distance (Feet) 120 Feet   Assistive device None   Gait Pattern Step-through pattern;Decreased arm swing - right;Decreased arm swing - left;Decreased step length - right;Decreased step length - left;Wide base of support;Decreased trunk rotation;Trunk rotated posteriorly on right   Ambulation Surface Level;Indoor   Gait Comments carrying objects (<5 lbs) with slower velocity, more guarded                PT Education - 08/24/16 0908    Education provided Yes   Education Details Patient brought in her HEP sheets for review and updating. Instructed pt to temporarily return to doing normal bridging (with 5 second hold) and emphasized only need to lift  hips high enough to clear surface and work on stability. Educated to continue supine trunk rotation, seated ball core stabilization ex's (raising arms alternately), seated clam exercise with red band, and recently added standing exercises (see 08/22/16)   Person(s) Educated Patient   Methods Explanation;Demonstration;Handout   Comprehension Verbalized understanding          PT Short Term Goals - 08/22/16 1009      PT SHORT TERM GOAL #1   Title Pt will be independent with HEP to address balance and dynamic gait.  TARGET 08/25/16   Time 2   Period Weeks   Status New     PT SHORT TERM GOAL #2   Title Pt will improve TUG score to less than or equal to 15 seconds for decreased fall risk.   Time 2   Period Weeks   Status New     PT SHORT TERM GOAL #3   Title Pt will verbalize understanding of fall prevention in the home environment.   Time 2   Period Weeks   Status New           PT Long Term Goals - 08/24/16 [id number]      PT LONG TERM GOAL #4   Status Achieved     Additional Long Term Goals   Additional Long Term Goals Yes     PT LONG TERM GOAL #6   Title Patient will improve composite SOT score by 10% and vision score to >/= 25/100 to demonstrate improved balance and safety.   Baseline SOT composite 22; vision 20   Time 5   Period Weeks   Status New               Plan - 08/24/16 0912    Clinical Impression Statement Sensory Organization Test completed with patient's composite score: 22/100  Somatosensory ~82/100, Vision ~20/100, Vestibular ~1/100. Patient with h/o acoustic neuroma and anticipated her use of her vision for maintaining balance would have scored better and this is clearly an area to address in her HEP. Current HEP reviewed as patient had been given exercises related to her recent PT sessions for her back pain, in addition to this PT episode for balance. See note for specific updates. Patient arrived 15 minutes late for today's session and therefore  further skilled therapy interventions limited. Discussed plan for next visit and encouraged patient to obtain help to take down her Christmas tree as this is the area/corner she uses for her HEP.   Rehab Potential Good   Clinical Impairments Affecting Rehab Potential History of R acoustic neuroma; surgically removed with "nerve severed"   PT Frequency 2x / week   PT Duration 6 weeks  plus eval   PT Treatment/Interventions ADLs/Self Care Home Management;Functional  mobility training;Gait training;Therapeutic activities;Therapeutic exercise;Balance training;Neuromuscular re-education;Patient/family education;DME Instruction   PT Next Visit Plan Initiate gaze stabilization exercises and corner balance ex's for HEP; high level balance activities   PT Home Exercise Plan see pt instructions for current HEP. Pt also continues to do seated clam exercise with red band   Consulted and Agree with Plan of Care Patient      Patient will benefit from skilled therapeutic intervention in order to improve the following deficits and impairments:  Abnormal gait, Decreased balance, Decreased strength, Difficulty walking, Postural dysfunction, Decreased mobility, Decreased knowledge of use of DME  Visit Diagnosis: Unsteadiness on feet  Other abnormalities of gait and mobility     Problem List Patient Active Problem List   Diagnosis Date Noted  . Prediabetes 07/20/2016  . Iron deficiency anemia 07/01/2016  . Chronic lumbar radiculopathy 06/17/2016  . Poor balance 05/24/2016  . At high risk for falls 05/24/2016  . Lumbosacral spondylosis without myelopathy 01/19/2016  . S/P excision of acoustic neuroma 01/19/2016  . Pruritus 01/19/2016  . Abdominal gas pain 01/19/2016  . B12 deficiency 10/17/2015  . Pernicious anemia 06/09/2015  . PVC (premature ventricular contraction) 02/25/2015  . Osteopenia, senile 01/12/2015  . Vitamin D deficiency 01/12/2015  . Nonspecific abnormal electrocardiogram (ECG) (EKG)  10/16/2014  . DIVERTICULITIS OF COLON 10/11/2009  . HYPERCHOLESTEROLEMIA, PURE 10/21/2008  . Essential hypertension 10/21/2008    Rexanne Mano, PT 08/24/2016, 9:46 AM  [facility] [address] [place], [place], [address] Phone: [phone]   Fax:  [phone]  Name: Emily Sweeney MRN: [id number] Date of Birth: [birth year]

## 2016-08-28 ENCOUNTER — Encounter: Payer: Self-pay | Admitting: Internal Medicine

## 2016-08-30 ENCOUNTER — Ambulatory Visit: Payer: Medicare Other | Admitting: Physical Therapy

## 2016-09-01 ENCOUNTER — Ambulatory Visit: Payer: Medicare Other | Admitting: Physical Therapy

## 2016-09-05 ENCOUNTER — Ambulatory Visit: Payer: Medicare Other | Admitting: Physical Therapy

## 2016-09-05 DIAGNOSIS — M6281 Muscle weakness (generalized): Secondary | ICD-10-CM | POA: Diagnosis not present

## 2016-09-05 DIAGNOSIS — R2681 Unsteadiness on feet: Secondary | ICD-10-CM | POA: Diagnosis not present

## 2016-09-05 DIAGNOSIS — R2689 Other abnormalities of gait and mobility: Secondary | ICD-10-CM | POA: Diagnosis not present

## 2016-09-05 NOTE — Therapy (Signed)
[place] [address] [place], [place], [address] Phone: [phone]   Fax:  [phone]  Physical Therapy Treatment  Patient Details  Name: Emily Sweeney MRN: [id number] Date of Birth: 08-22-1940 Referring Provider: Billey Gosling, MD  Encounter Date: 09/05/2016      PT End of Session - 09/05/16 0909    Visit Number 4  New episode of care   Number of Visits 13   Date for PT Re-Evaluation 10/11/16   Authorization Type Medicare Traditional/AARP-GCODE due every 10th visit   PT Start Time 0815   PT Stop Time 0849   PT Time Calculation (min) 34 min   Equipment Utilized During Treatment Gait belt   Activity Tolerance Patient tolerated treatment well   Behavior During Therapy [facility] for tasks assessed/performed      Past Medical History:  Diagnosis Date  . Anemia   . Blood transfusion without reported diagnosis   . Diverticulosis of colon   . GERD (gastroesophageal reflux disease)   . Hearing loss    right ear, and tinnitus  . Hyperlipidemia   . Hypertension   . Tubular adenoma 03/05/2015   Polyp, and Benign lymphoid polyp.    Past Surgical History:  Procedure Laterality Date  . APPENDECTOMY    . BREAST BIOPSY     benign  . BREAST LUMPECTOMY    . COCHLEAR IMPLANT    . CRANIECTOMY FOR EXCISION OF ACOUSTIC NEUROMA  2009  . NASAL SEPTUM SURGERY    . TONSILLECTOMY      There were no vitals filed for this visit.      Subjective Assessment - 09/05/16 0810    Subjective Patient reports fell yesterday reaching down to floor to squish a bug and fell forward "gently" onto her Rt side, RUE outstretched. Denies pain or injury. Reports second near fall when moving her trashcan out to curb and she turned 180* with onset dizziness/swimmy-headed. Denies having PT for acoustic neuroma in 2010. Has a 1 year history of sporadic blurry vision when driving. Unable to pinpoint a particular situation during driving that  triggers. Saw opthalmologist ~ 1 year ago when it began and they could not find any cause.    Pertinent History lumbar spondylosis, osteopenia, acoustic neuroma removed on R side 2010 (pt corrected was not 2009 as previously reported)   Limitations Standing;Walking;House hold activities   Patient Stated Goals Pt would like to be able to improve balance.   Currently in Pain? Yes   Pain Score 4    Pain Location Hip   Pain Orientation Right;Posterior   Pain Descriptors / Indicators Discomfort   Pain Type Acute pain   Pain Radiating Towards Rt posterior thigh   Pain Onset 1 to 4 weeks ago   Pain Frequency Intermittent   Aggravating Factors  feels bridging exercise is one thing aggravating; did not hurt when she was seeing ortho PT for her back and they gave her the exercise   Pain Relieving Factors Aleve   Effect of Pain on Daily Activities needed help taking down her Christmas tree   Multiple Pain Sites No                          Vestibular Treatment/Exercise - 09/05/16 0001      Vestibular Treatment/Exercise   Vestibular Treatment Provided Gaze;Habituation   Habituation Exercises 180 degree Turns   Gaze Exercises X1 Viewing Horizontal;X1 Viewing Vertical;Eye/Head Exercise Vertical  180 degree Turns   Number of Reps  4   Symptom Description  worse with rt turns (90* + 90*); rt and left twice; "swimmy headed"     X1 Viewing Horizontal   Foot Position seated, back supported   Reps 3   Comments 30 sec, 60 sec, 60 sec; 5-6/10 symtoms; vc for increasing speed to achieve moderate symptoms     X1 Viewing Vertical   Foot Position seated, back and arms supported   Reps 3   Comments 30 sec, 60 sec, 60 sec; 5-6/10 symtoms; vc for increasing speed to achieve moderate symptoms     Eye/Head Exercise Vertical   Foot Position seated   Reps 2   Comments 60 seconds smooth pursuits horizontally wiht no incr symptoms; 30 sec x 2 vertical smooth pursuits with 5/10 symptoms                PT Education - 09/05/16 0907    Education provided Yes   Education Details See instructions for oculomotor/gaze stabilization exercises added to HEP. See tip card on HEP re: how to monitor exercise progression (or reduce complexity if symptoms too severe). Educated on importance of consistency with vestibular exercises to create change/improvements.    Person(s) Educated Patient   Methods Explanation;Demonstration;Verbal cues   Comprehension Verbalized understanding;Returned demonstration;Need further instruction          PT Short Term Goals - 08/22/16 1009      PT SHORT TERM GOAL #1   Title Pt will be independent with HEP to address balance and dynamic gait.  TARGET 08/25/16   Time 2   Period Weeks   Status New     PT SHORT TERM GOAL #2   Title Pt will improve TUG score to less than or equal to 15 seconds for decreased fall risk.   Time 2   Period Weeks   Status New     PT SHORT TERM GOAL #3   Title Pt will verbalize understanding of fall prevention in the home environment.   Time 2   Period Weeks   Status New           PT Long Term Goals - 08/24/16 [id number]      PT LONG TERM GOAL #4   Status Achieved     Additional Long Term Goals   Additional Long Term Goals Yes     PT LONG TERM GOAL #6   Title Patient will improve composite SOT score by 10% and vision score to >/= 25/100 to demonstrate improved balance and safety.   Baseline SOT composite 22; vision 20   Time 5   Period Weeks   Status New               Plan - 09/05/16 0911    Clinical Impression Statement Patient reports no vestibular PT provided s/p acoustic neuroma resection in 2010, therefore vision/vestibular exercises are new to her. Able to provoke moderate symptoms with smooth pursuits and VORx1 in supported seated position with plain visual background. During habituation exercise of 180 degree turns, pt reported the sensation she experienced is the same she has had when  driving. Patient with excellent return demonstration of exercises.    Rehab Potential Good   Clinical Impairments Affecting Rehab Potential History of R acoustic neuroma; surgically removed with "nerve severed" 2010 (no vestibular PT following)   PT Frequency 2x / week   PT Duration 6 weeks  plus eval   PT Treatment/Interventions ADLs/Self Care Home  Management;Functional mobility training;Gait training;Therapeutic activities;Therapeutic exercise;Balance training;Neuromuscular re-education;Patient/family education;DME Instruction;Vestibular;Visual/perceptual remediation/compensation   PT Next Visit Plan Review STG's (missed 1 week due to weather)--TUG, give fall prevention; Review eye and habituation ex's given 1/23--decrease support surface as indicated (unsupported sitting?); initiate corner balance ex's for HEP; high level balance activities   PT Home Exercise Plan bridging (legs on physioball), seated UE ex's on physioball, seated clam exercise with red band (all from ortho OPPT); standing marching, heel raises, hip abdct ,forward stepups for RLE strengthening; VORx1, smooth pursuits, 180-360 turns habituation   Consulted and Agree with Plan of Care Patient      Patient will benefit from skilled therapeutic intervention in order to improve the following deficits and impairments:  Abnormal gait, Decreased balance, Decreased strength, Difficulty walking, Postural dysfunction, Decreased mobility, Decreased knowledge of use of DME, Dizziness, Other (comment) (impaired vestibular system)  Visit Diagnosis: Unsteadiness on feet  Muscle weakness (generalized)     Problem List Patient Active Problem List   Diagnosis Date Noted  . Prediabetes 07/20/2016  . Iron deficiency anemia 07/01/2016  . Chronic lumbar radiculopathy 06/17/2016  . Poor balance 05/24/2016  . At high risk for falls 05/24/2016  . Lumbosacral spondylosis without myelopathy 01/19/2016  . S/P excision of acoustic neuroma  01/19/2016  . Pruritus 01/19/2016  . Abdominal gas pain 01/19/2016  . B12 deficiency 10/17/2015  . Pernicious anemia 06/09/2015  . PVC (premature ventricular contraction) 02/25/2015  . Osteopenia, senile 01/12/2015  . Vitamin D deficiency 01/12/2015  . Nonspecific abnormal electrocardiogram (ECG) (EKG) 10/16/2014  . DIVERTICULITIS OF COLON 10/11/2009  . HYPERCHOLESTEROLEMIA, PURE 10/21/2008  . Essential hypertension 10/21/2008    09/05/2016 Barry Brunner, PT    Emily Sweeney 09/05/2016, 9:27 AM  [facility] [address] [place], [place], [address] Phone: [phone]   Fax:  [phone]  Name: Emily Sweeney MRN: [id number] Date of Birth: [birth year]

## 2016-09-05 NOTE — Patient Instructions (Addendum)
  09/05/2016   Gaze Stabilization: Tip Card  1.Target must remain in focus, not blurry, and appear stationary while head is in motion. 2.Perform exercises with small head movements (45 to either side of midline). 3.Increase speed of head motion so long as target is in focus. 4.If you wear eyeglasses, be sure you can see target through lens (therapist will give specific instructions for bifocal / progressive lenses). 5.These exercises should provoke moderated dizziness or nausea. Work through these symptoms. If too dizzy, slow head movement slightly. Rest between each exercise. 6.Exercises demand concentration; avoid distractions. (Use a plain background).  Copyright  VHI. All rights reserved.     Special Instructions: Exercises may bring on mild to moderate symptoms of dizziness that resolve within 30 minutes of completing exercises. If symptoms are lasting longer than 30 minutes, modify your exercises by:  >decreasing the # of times you complete each activity >ensuring your symptoms return to baseline before moving onto the next exercise >dividing up exercises so you do not do them all in one session, but multiple short sessions throughout the day >doing them once a day until symptoms improve   Gaze Stabilization: Sitting    Keeping eyes on target on wall 3 feet away, and move head side to side for _60___ seconds. Repeat while moving head up and down for __60__ seconds. Do __3__ repetitions; once per day.  Copyright  VHI. All rights reserved.    Gaze Stabilization - Tip Card  Oculomotor: Smooth Pursuits    Holding a target, keep eyes on target and slowly move target up and down with head still. Perform sitting with back supported. Do for 60 seconds.  Repeat ___3_ times per session. Do _1___ sessions per day.   Copyright  VHI. All rights reserved.     Turning 180 and 360 degrees with visual target. Head turn first, visual target in focus, turn body, turn head again to  next target, etc. Repeat x 5 each direction at least 1x/day.

## 2016-09-07 ENCOUNTER — Ambulatory Visit: Payer: Medicare Other | Admitting: Physical Therapy

## 2016-09-07 DIAGNOSIS — R2681 Unsteadiness on feet: Secondary | ICD-10-CM | POA: Diagnosis not present

## 2016-09-07 DIAGNOSIS — M6281 Muscle weakness (generalized): Secondary | ICD-10-CM | POA: Diagnosis not present

## 2016-09-07 DIAGNOSIS — R2689 Other abnormalities of gait and mobility: Secondary | ICD-10-CM | POA: Diagnosis not present

## 2016-09-07 NOTE — Patient Instructions (Addendum)
Fall Prevention in the Home Introduction Falls can cause injuries. They can happen to people of all ages. There are many things you can do to make your home safe and to help prevent falls. What can I do on the outside of my home?  Regularly fix the edges of walkways and driveways and fix any cracks.  Remove anything that might make you trip as you walk through a door, such as a raised step or threshold.  Trim any bushes or trees on the path to your home.  Use bright outdoor lighting.  Clear any walking paths of anything that might make someone trip, such as rocks or tools.  Regularly check to see if handrails are loose or broken. Make sure that both sides of any steps have handrails.  Any raised decks and porches should have guardrails on the edges.  Have any leaves, snow, or ice cleared regularly.  Use sand or salt on walking paths during winter.  Clean up any spills in your garage right away. This includes oil or grease spills. What can I do in the bathroom?  Use night lights.  Install grab bars by the toilet and in the tub and shower. Do not use towel bars as grab bars.  Use non-skid mats or decals in the tub or shower.  If you need to sit down in the shower, use a plastic, non-slip stool.  Keep the floor dry. Clean up any water that spills on the floor as soon as it happens.  Remove soap buildup in the tub or shower regularly.  Attach bath mats securely with double-sided non-slip rug tape.  Do not have throw rugs and other things on the floor that can make you trip. What can I do in the bedroom?  Use night lights.  Make sure that you have a light by your bed that is easy to reach.  Do not use any sheets or blankets that are too big for your bed. They should not hang down onto the floor.  Have a firm chair that has side arms. You can use this for support while you get dressed.  Do not have throw rugs and other things on the floor that can make you trip. What can  I do in the kitchen?  Clean up any spills right away.  Avoid walking on wet floors.  Keep items that you use a lot in easy-to-reach places.  If you need to reach something above you, use a strong step stool that has a grab bar.  Keep electrical cords out of the way.  Do not use floor polish or wax that makes floors slippery. If you must use wax, use non-skid floor wax.  Do not have throw rugs and other things on the floor that can make you trip. What can I do with my stairs?  Do not leave any items on the stairs.  Make sure that there are handrails on both sides of the stairs and use them. Fix handrails that are broken or loose. Make sure that handrails are as long as the stairways.  Check any carpeting to make sure that it is firmly attached to the stairs. Fix any carpet that is loose or worn.  Avoid having throw rugs at the top or bottom of the stairs. If you do have throw rugs, attach them to the floor with carpet tape.  Make sure that you have a light switch at the top of the stairs and the bottom of the stairs. If you   do not have them, ask someone to add them for you. What else can I do to help prevent falls?  Wear shoes that:  Do not have high heels.  Have rubber bottoms.  Are comfortable and fit you well.  Are closed at the toe. Do not wear sandals.  If you use a stepladder:  Make sure that it is fully opened. Do not climb a closed stepladder.  Make sure that both sides of the stepladder are locked into place.  Ask someone to hold it for you, if possible.  Clearly mark and make sure that you can see:  Any grab bars or handrails.  First and last steps.  Where the edge of each step is.  Use tools that help you move around (mobility aids) if they are needed. These include:  Canes.  Walkers.  Scooters.  Crutches.  Turn on the lights when you go into a dark area. Replace any light bulbs as soon as they burn out.  Set up your furniture so you have a  clear path. Avoid moving your furniture around.  If any of your floors are uneven, fix them.  If there are any pets around you, be aware of where they are.  Review your medicines with your doctor. Some medicines can make you feel dizzy. This can increase your chance of falling. Ask your doctor what other things that you can do to help prevent falls. This information is not intended to replace advice given to you by your health care provider. Make sure you discuss any questions you have with your health care provider. Document Released: 05/27/2009 Document Revised: 01/06/2016 Document Reviewed: 09/04/2014  2017 Elsevier  

## 2016-09-07 NOTE — Therapy (Signed)
[place] [address] [place], [place], [address] Phone: [phone]   Fax:  [phone]  Physical Therapy Treatment  Patient Details  Name: Emily Sweeney MRN: [id number] Date of Birth: 1941-04-28 Referring Provider: Billey Gosling, MD  Encounter Date: 09/07/2016      PT End of Session - 09/07/16 1711    Visit Number 5  New episode of care   Number of Visits 13   Date for PT Re-Evaluation 10/11/16   Authorization Type Medicare Traditional/AARP-GCODE due every 10th visit   PT Start Time 0804   PT Stop Time 0847   PT Time Calculation (min) 43 min   Equipment Utilized During Treatment Gait belt   Activity Tolerance Patient tolerated treatment well   Behavior During Therapy [facility] for tasks assessed/performed      Past Medical History:  Diagnosis Date  . Anemia   . Blood transfusion without reported diagnosis   . Diverticulosis of colon   . GERD (gastroesophageal reflux disease)   . Hearing loss    right ear, and tinnitus  . Hyperlipidemia   . Hypertension   . Tubular adenoma 03/05/2015   Polyp, and Benign lymphoid polyp.    Past Surgical History:  Procedure Laterality Date  . APPENDECTOMY    . BREAST BIOPSY     benign  . BREAST LUMPECTOMY    . COCHLEAR IMPLANT    . CRANIECTOMY FOR EXCISION OF ACOUSTIC NEUROMA  2009  . NASAL SEPTUM SURGERY    . TONSILLECTOMY      There were no vitals filed for this visit.      Subjective Assessment - 09/07/16 0806    Subjective No more falls since last visit.  No pain associated with those falls.  Feel like the exercises are helping.   Pertinent History lumbar spondylosis, osteopenia, acoustic neuroma removed on R side 2010 (pt corrected was not 2009 as previously reported)   Limitations Standing;Walking;House hold activities   Patient Stated Goals Pt would like to be able to improve balance.   Currently in Pain? No/denies   Pain Onset 1 to 4 weeks ago                          [facility] Adult PT Treatment/Exercise - 09/07/16 0810      Standardized Balance Assessment   Standardized Balance Assessment Timed Up and Go Test     Timed Up and Go Test   TUG Normal TUG   Normal TUG (seconds) 15.69  then 13.1 sec (avg:  13.39 sec     High Level Balance   High Level Balance Comments Standing in corner on pillow/foam:  marching in place, forward kicks, alternating step taps to ground x 10 reps each with UE support.  On ramp incline marching in place, forward step taps x 10 reps with min guard assistance, then head turns x 10, head nods x 10 with min guard assistance; on decline of ramp head turns x 10, head nods x 10 with min guard assistance.     Self-Care   Self-Care Other Self-Care Comments   Other Self-Care Comments  Fall prevention education provided-see handout on instructions.  Discussed planning movements, taking time, avoiding quick/excessive movements outside BOS in addition to discussing fall prevention handout         Vestibular Treatment/Exercise - 09/07/16 0001      180 degree Turns   Number of Reps  2  each direction, cues for  visual target    Symptom Description  Pt prefers to keep eyes on target, then turn head once she has turned.     X1 Viewing Horizontal   Foot Position seated, back supported; progressed to unsupported sitting   Reps 1  then 1 rep 30 seconds unsupported sitting   Comments 60 seconds, 4/10 rating of dizziness (REview of HEP-pt return demo understanding)     X1 Viewing Vertical   Foot Position seated, back and arms supported, progress to unsupported sitting   Reps 2  then 1 rep 30 seconds unsupported   Comments 60 seconds, rates 4/10 dizziness     Eye/Head Exercise Vertical   Foot Position seated   Reps 1  60 seconds 4-5/10 dizziness    Comments then 1 rep 30 seconds unsupported sitting               PT Education - 09/07/16 1722    Education provided Yes   Education Details  Progression of seated ocumotor/gaze stabilization from HEP 1/23-to unsupported sitting.  Fall prevention   Person(s) Educated Patient   Methods Explanation;Demonstration   Comprehension Verbalized understanding          PT Short Term Goals - 09/07/16 0808      PT SHORT TERM GOAL #1   Title Pt will be independent with HEP to address balance and dynamic gait.  TARGET 08/25/16   Time 2   Period Weeks   Status Achieved     PT SHORT TERM GOAL #2   Title Pt will improve TUG score to less than or equal to 15 seconds for decreased fall risk.   Time 2   Period Weeks   Status Achieved     PT SHORT TERM GOAL #3   Title Pt will verbalize understanding of fall prevention in the home environment.   Time 2   Period Weeks   Status Achieved           PT Long Term Goals - 08/24/16 3893      PT LONG TERM GOAL #4   Status Achieved     Additional Long Term Goals   Additional Long Term Goals Yes     PT LONG TERM GOAL #6   Title Patient will improve composite SOT score by 10% and vision score to >/= 25/100 to demonstrate improved balance and safety.   Baseline SOT composite 22; vision 20   Time 5   Period Weeks   Status New               Plan - 09/07/16 1713    Clinical Impression Statement Pt has met STG 1-3.   Pt overall feels that exercises are helping.  Continues to report moderate symptoms with seated VOR and smooth pursuits, with progression of HEP to unsupported sitting.  Pt will continue to benefit from skilled physical therapy to address balance/vestibular symptoms contribuing to gait/mobility difficulties.   Rehab Potential Good   Clinical Impairments Affecting Rehab Potential History of R acoustic neuroma; surgically removed with "nerve severed" 2010 (no vestibular PT following)   PT Frequency 2x / week   PT Duration 6 weeks  plus eval   PT Treatment/Interventions ADLs/Self Care Home Management;Functional mobility training;Gait training;Therapeutic  activities;Therapeutic exercise;Balance training;Neuromuscular re-education;Patient/family education;DME Instruction;Vestibular;Visual/perceptual remediation/compensation   PT Next Visit Plan Continue to progress eye and habituation exercises given 1/23-try standing; continue to work on corner balance activities and dynamic gait   PT Home Exercise Plan bridging (legs on physioball), seated UE  ex's on physioball, seated clam exercise with red band (all from ortho OPPT); standing marching, heel raises, hip abdct ,forward stepups for RLE strengthening; VORx1, smooth pursuits, 180-360 turns habituation   Consulted and Agree with Plan of Care Patient      Patient will benefit from skilled therapeutic intervention in order to improve the following deficits and impairments:  Abnormal gait, Decreased balance, Decreased strength, Difficulty walking, Postural dysfunction, Decreased mobility, Decreased knowledge of use of DME, Dizziness, Other (comment) (impaired vestibular system)  Visit Diagnosis: Unsteadiness on feet  Other abnormalities of gait and mobility     Problem List Patient Active Problem List   Diagnosis Date Noted  . Prediabetes 07/20/2016  . Iron deficiency anemia 07/01/2016  . Chronic lumbar radiculopathy 06/17/2016  . Poor balance 05/24/2016  . At high risk for falls 05/24/2016  . Lumbosacral spondylosis without myelopathy 01/19/2016  . S/P excision of acoustic neuroma 01/19/2016  . Pruritus 01/19/2016  . Abdominal gas pain 01/19/2016  . B12 deficiency 10/17/2015  . Pernicious anemia 06/09/2015  . PVC (premature ventricular contraction) 02/25/2015  . Osteopenia, senile 01/12/2015  . Vitamin D deficiency 01/12/2015  . Nonspecific abnormal electrocardiogram (ECG) (EKG) 10/16/2014  . DIVERTICULITIS OF COLON 10/11/2009  . HYPERCHOLESTEROLEMIA, PURE 10/21/2008  . Essential hypertension 10/21/2008    MARRIOTT,AMY W. 09/07/2016, 5:25 PM  Frazier Butt., PT  [place] [address] [place], [place], [address] Phone: [phone]   Fax:  [phone]  Name: Emily Sweeney MRN: [id number] Date of Birth: [birth year]

## 2016-09-12 ENCOUNTER — Ambulatory Visit: Payer: Medicare Other | Admitting: Physical Therapy

## 2016-09-13 ENCOUNTER — Ambulatory Visit: Payer: Medicare Other | Admitting: Physical Therapy

## 2016-09-13 DIAGNOSIS — R2681 Unsteadiness on feet: Secondary | ICD-10-CM | POA: Diagnosis not present

## 2016-09-13 DIAGNOSIS — R2689 Other abnormalities of gait and mobility: Secondary | ICD-10-CM

## 2016-09-13 DIAGNOSIS — M6281 Muscle weakness (generalized): Secondary | ICD-10-CM | POA: Diagnosis not present

## 2016-09-13 NOTE — Therapy (Signed)
[facility] [address] [place], [place], [address] Phone: [phone]   Fax:  [phone]  Physical Therapy Treatment  Patient Details  Name: Emily Sweeney MRN: [id number] Date of Birth: 11/09/1940 Referring Provider: Billey Gosling, MD  Encounter Date: 09/13/2016      PT End of Session - 09/13/16 1252    Visit Number 6  New episode of care   Number of Visits 13   Date for PT Re-Evaluation 10/11/16   Authorization Type Medicare Traditional/AARP-GCODE due every 10th visit   PT Start Time 0855   PT Stop Time 0934   PT Time Calculation (min) 39 min   Equipment Utilized During Treatment Gait belt   Activity Tolerance Patient tolerated treatment well   Behavior During Therapy [facility] for tasks assessed/performed      Past Medical History:  Diagnosis Date  . Anemia   . Blood transfusion without reported diagnosis   . Diverticulosis of colon   . GERD (gastroesophageal reflux disease)   . Hearing loss    right ear, and tinnitus  . Hyperlipidemia   . Hypertension   . Tubular adenoma 03/05/2015   Polyp, and Benign lymphoid polyp.    Past Surgical History:  Procedure Laterality Date  . APPENDECTOMY    . BREAST BIOPSY     benign  . BREAST LUMPECTOMY    . COCHLEAR IMPLANT    . CRANIECTOMY FOR EXCISION OF ACOUSTIC NEUROMA  2009  . NASAL SEPTUM SURGERY    . TONSILLECTOMY      There were no vitals filed for this visit.      Subjective Assessment - 09/13/16 0856    Subjective Reports exercises have gotten easier and are really helping.    Pertinent History lumbar spondylosis, osteopenia, acoustic neuroma removed on R side 2010 (pt corrected was not 2009 as previously reported)   Limitations Standing;Walking;House hold activities   Patient Stated Goals Pt would like to be able to improve balance.   Currently in Pain? No/denies   Pain Onset --                         [facility] Adult PT  Treatment/Exercise - 09/13/16 0001      Ambulation/Gait   Ambulation/Gait Assistance 5: Supervision   Ambulation/Gait Assistance Details vc for head turns Rt, center, lt, center every 3 steps and then vertical head nods. Patient without LOB during this activity and was able to maintain her self-selected walking speed. Without head turns, worked on increasing gait velocity         Vestibular Treatment/Exercise - 09/13/16 0001      Vestibular Treatment/Exercise   Gaze Exercises X1 Viewing Horizontal;X1 Viewing Vertical     X1 Viewing Horizontal   Foot Position standing feet apart, then less than shoulder width    Reps 2   Comments 30-60 seconds      X1 Viewing Vertical   Foot Position standing feet apart, then less than shoulder width    Reps 2   Comments 30-60 seconds      Eye/Head Exercise Vertical   Foot Position --   Reps --   Comments --            Balance Exercises - 09/13/16 1246      Balance Exercises: Standing   Standing Eyes Opened Narrow base of support (BOS);Foam/compliant surface;3 reps;30 secs  3 reps lt & rt tandem black foam and perpendicular x 3 reps  Turning Right;Left;10 reps  at counter 180 with finding visual target           PT Education - 09/13/16 1250    Education provided Yes   Education Details Progresson of gaze stabilliztion from HEP to standing on solid surface feet apart. Once can do x 60 seconds without imbalance, progress to feet together. Next will progress to standing on foam feet apart and then feet together.   Person(s) Educated Patient   Methods Explanation;Demonstration;Handout;Verbal cues   Comprehension Verbalized understanding;Returned demonstration;Need further instruction          PT Short Term Goals - 09/07/16 [id number]      PT SHORT TERM GOAL #1   Title Pt will be independent with HEP to address balance and dynamic gait.  TARGET 08/25/16   Time 2   Period Weeks   Status Achieved     PT SHORT TERM GOAL #2   Title  Pt will improve TUG score to less than or equal to 15 seconds for decreased fall risk.   Time 2   Period Weeks   Status Achieved     PT SHORT TERM GOAL #3   Title Pt will verbalize understanding of fall prevention in the home environment.   Time 2   Period Weeks   Status Achieved           PT Long Term Goals - 08/24/16 [id number]      PT LONG TERM GOAL #4   Status Achieved     Additional Long Term Goals   Additional Long Term Goals Yes     PT LONG TERM GOAL #6   Title Patient will improve composite SOT score by 10% and vision score to >/= 25/100 to demonstrate improved balance and safety.   Baseline SOT composite 22; vision 20   Time 5   Period Weeks   Status New               Plan - 09/13/16 1252    Clinical Impression Statement Patient reports continued improvement in her balance and visual changes when turning. This is evident in her ability to once again increase the difficulty of her VOR x1 exercises. Also notable during dynamic gait challenges. Patient will continue to benefit from balance and vestibular training to decrease her fall risk.    Rehab Potential Good   Clinical Impairments Affecting Rehab Potential History of R acoustic neuroma; surgically removed with "nerve severed" 2010 (no vestibular PT following)   PT Frequency 2x / week   PT Duration 6 weeks  plus eval   PT Treatment/Interventions ADLs/Self Care Home Management;Functional mobility training;Gait training;Therapeutic activities;Therapeutic exercise;Balance training;Neuromuscular re-education;Patient/family education;DME Instruction;Vestibular;Visual/perceptual remediation/compensation   PT Next Visit Plan Continue to progress eye and habituation exercises given 1/23-try standing feet together and ?progress to on foam; continue to work on balance activities and dynamic gait   PT Home Exercise Plan bridging (legs on physioball), seated UE ex's on physioball, seated clam exercise with red band (all from  ortho OPPT); standing marching, heel raises, hip abdct ,forward stepups for RLE strengthening; VORx1, smooth pursuits, 180-360 turns habituation   Consulted and Agree with Plan of Care Patient      Patient will benefit from skilled therapeutic intervention in order to improve the following deficits and impairments:  Abnormal gait, Decreased balance, Decreased strength, Difficulty walking, Postural dysfunction, Decreased mobility, Decreased knowledge of use of DME, Dizziness, Other (comment) (impaired vestibular system)  Visit Diagnosis: Unsteadiness on feet  Other abnormalities of  gait and mobility     Problem List Patient Active Problem List   Diagnosis Date Noted  . Prediabetes 07/20/2016  . Iron deficiency anemia 07/01/2016  . Chronic lumbar radiculopathy 06/17/2016  . Poor balance 05/24/2016  . At high risk for falls 05/24/2016  . Lumbosacral spondylosis without myelopathy 01/19/2016  . S/P excision of acoustic neuroma 01/19/2016  . Pruritus 01/19/2016  . Abdominal gas pain 01/19/2016  . B12 deficiency 10/17/2015  . Pernicious anemia 06/09/2015  . PVC (premature ventricular contraction) 02/25/2015  . Osteopenia, senile 01/12/2015  . Vitamin D deficiency 01/12/2015  . Nonspecific abnormal electrocardiogram (ECG) (EKG) 10/16/2014  . DIVERTICULITIS OF COLON 10/11/2009  . HYPERCHOLESTEROLEMIA, PURE 10/21/2008  . Essential hypertension 10/21/2008    Rexanne Mano, PT 09/13/2016, 1:01 PM  [facility] [address] [place], [place], [address] Phone: [phone]   Fax:  [phone]  Name: Emily Sweeney MRN: [id number] Date of Birth: 04-20-1941

## 2016-09-13 NOTE — Patient Instructions (Signed)
Balance: Eyes Open - Bilateral (Varied Surfaces)    Stand, feet shoulder width, eyes open. Maintain balance focusing on "A" turn head side to side for 60 seconds. Repeat with head up and down.  Repeat 3 times per set. Do 3 sets per sessions. Progress to compliant surface: foam when able to stand on solid ground with feet together.                                                Feet Together, Head Motion - Eyes Open    With eyes open, feet together, move head slowly: up and down. Repeat 3 times per session. Do 3 sessions per day.  Copyright  VHI. All rights reserved.                         Feet Partial Heel-Toe, Head Motion - Eyes Open    With eyes open, right foot partially in front of the other, move head slowly: up and down. Repeat 3 times per session. Do 3 sessions per day.  Copyright  VHI. All rights reserved.                         Feet Apart (Compliant Surface) Head Motion - Eyes Open    With eyes open, standing on compliant surface:  feet shoulder width apart, move head slowly: up and down. Repeat 3 times per session. Do 3 sessions per day.  Copyright  VHI. All rights reserved.                        Feet Together (Compliant Surface) Head Motion - Eyes Open    With eyes open, standing on compliant surface: feet together, move head slowly: up and down. Repeat 3 times per session. Do 3 sessions per day.  Copyright  VHI. All rights reserved.

## 2016-09-19 ENCOUNTER — Ambulatory Visit: Payer: Medicare Other | Attending: Internal Medicine | Admitting: Physical Therapy

## 2016-09-19 DIAGNOSIS — G8929 Other chronic pain: Secondary | ICD-10-CM | POA: Insufficient documentation

## 2016-09-19 DIAGNOSIS — R2689 Other abnormalities of gait and mobility: Secondary | ICD-10-CM | POA: Diagnosis not present

## 2016-09-19 DIAGNOSIS — R293 Abnormal posture: Secondary | ICD-10-CM | POA: Insufficient documentation

## 2016-09-19 DIAGNOSIS — R2681 Unsteadiness on feet: Secondary | ICD-10-CM | POA: Diagnosis not present

## 2016-09-19 DIAGNOSIS — R42 Dizziness and giddiness: Secondary | ICD-10-CM | POA: Diagnosis not present

## 2016-09-19 DIAGNOSIS — M6281 Muscle weakness (generalized): Secondary | ICD-10-CM | POA: Insufficient documentation

## 2016-09-19 DIAGNOSIS — M5440 Lumbago with sciatica, unspecified side: Secondary | ICD-10-CM | POA: Diagnosis not present

## 2016-09-19 NOTE — Therapy (Signed)
[facility] [address] [place], [place], [address] Phone: [phone]   Fax:  [phone]  Physical Therapy Treatment  Patient Details  Name: Emily Sweeney MRN: [id number] Date of Birth: [birth year] Referring Provider: Billey Gosling, MD  Encounter Date: 09/19/2016      PT End of Session - 09/19/16 2149    Visit Number 7  New episode of care   Number of Visits 13   Date for PT Re-Evaluation 10/11/16   Authorization Type Medicare Traditional/AARP-GCODE due every 10th visit   PT Start Time 0802   PT Stop Time 0846   PT Time Calculation (min) 44 min   Equipment Utilized During Treatment Gait belt   Activity Tolerance Patient tolerated treatment well   Behavior During Therapy [facility] for tasks assessed/performed      Past Medical History:  Diagnosis Date  . Anemia   . Blood transfusion without reported diagnosis   . Diverticulosis of colon   . GERD (gastroesophageal reflux disease)   . Hearing loss    right ear, and tinnitus  . Hyperlipidemia   . Hypertension   . Tubular adenoma 03/05/2015   Polyp, and Benign lymphoid polyp.    Past Surgical History:  Procedure Laterality Date  . APPENDECTOMY    . BREAST BIOPSY     benign  . BREAST LUMPECTOMY    . COCHLEAR IMPLANT    . CRANIECTOMY FOR EXCISION OF ACOUSTIC NEUROMA  2009  . NASAL SEPTUM SURGERY    . TONSILLECTOMY      There were no vitals filed for this visit.      Subjective Assessment - 09/19/16 0804    Subjective Back has been bothering her more. Not sure why. Has lumbar steroid injection later next week. Has progressed to standing ex's feet together and even on pillow. Reports bending over still triggers sense of imbalance.   Pertinent History lumbar spondylosis, osteopenia, acoustic neuroma removed on R side 2010 (pt corrected was not 2009 as previously reported)   Limitations Standing;Walking;House hold activities   Patient Stated Goals Pt would  like to be able to improve balance.   Currently in Pain? Yes   Pain Score 3    Pain Location Back   Pain Orientation Mid   Pain Descriptors / Indicators Aching   Pain Type Chronic pain   Pain Radiating Towards Rt anterior thigh   Pain Onset More than a month ago   Pain Frequency Intermittent                         [facility] Adult PT Treatment/Exercise - 09/19/16 0001      Ambulation/Gait   Ambulation/Gait Assistance 5: Supervision   Ambulation/Gait Assistance Details vc for incr velocity and incorporating head turns for vestibular challenge   Ambulation Distance (Feet) 220 Feet   Assistive device None   Gait Pattern Step-through pattern;Decreased arm swing - right;Decreased arm swing - left;Decreased step length - right;Decreased step length - left;Wide base of support;Decreased trunk rotation   Ambulation Surface Level;Indoor             Balance Exercises - 09/19/16 2137      Balance Exercises: Standing   Standing Eyes Opened Narrow base of support (BOS);Head turns;Foam/compliant surface;3 reps;30 secs  horizontal, vertical VORx1; 3 reps each   Tandem Stance Eyes open;Foam/compliant surface  on ramp (ascend and descend); narrowing BOS; min assist   SLS with Vectors Foam/compliant surface;Intermittent upper extremity assist  tap cones with forefoot; in front & crossing midline; 10 rep   Other Standing Exercises stand on blue mat, feet < shoulder width, toss ball up and catching x 10     In // bars, step onto black beam (perpendicular) and then second foot step tap on 4" step in front of beam, step back down with each foot x 10 reps leading rt foot and then lt foot; single UE support       PT Education - 09/19/16 2147    Education provided Yes   Education Details Use of ice vs heat on back pain. Can use physioball as a chair and may help relieve back pain with prolonged sitting. (She reports ball is not properly inflated)   Person(s) Educated Patient    Methods Explanation   Comprehension Verbalized understanding          PT Short Term Goals - 09/07/16 0808      PT SHORT TERM GOAL #1   Title Pt will be independent with HEP to address balance and dynamic gait.  TARGET 08/25/16   Time 2   Period Weeks   Status Achieved     PT SHORT TERM GOAL #2   Title Pt will improve TUG score to less than or equal to 15 seconds for decreased fall risk.   Time 2   Period Weeks   Status Achieved     PT SHORT TERM GOAL #3   Title Pt will verbalize understanding of fall prevention in the home environment.   Time 2   Period Weeks   Status Achieved           PT Long Term Goals - 09/19/16 2219      PT LONG TERM GOAL #1   Title Pt will improve Berg score to at least 42/56 for decreased fall risk.  TARGET 09/22/16   Time 6   Period Weeks   Status New     PT LONG TERM GOAL #2   Title Pt will improve Dynamic Gait Index score to at least 15/24 for decreased fall risk.   Time 6   Period Weeks   Status New     PT LONG TERM GOAL #3   Title Pt will imrpove gait velocity score to at least 2.62 ft/sec for improved gait efficiency and safety.   Time 6   Period Weeks   Status New     PT LONG TERM GOAL #4   Title Sensory Organization test to be performed, with goal to be written as appropriate.   Time 6   Period Weeks   Status New     PT LONG TERM GOAL #5   Title Pt will improve ABC scale score on FOTO by at least 20% for improved balance confidence.   Time 6   Period Weeks   Status New     PT LONG TERM GOAL #6   Title Patient will improve composite SOT score by 10% and vision score to >/= 25/100 to demonstrate improved balance and safety.   Baseline SOT composite 22; vision 20   Time 5   Period Weeks   Status New               Plan - 09/19/16 2150    Clinical Impression Statement Patient's balance continues to improve. She clearly has faithfully been doing her HEP/vestibular exercises. Unfortunately her back pain has  recently increased and somewhat limited treatment options this date. Attempted VORx2 exercises in standing and pt  unable to coordinate head/eye movements (she loses sight of target).    Rehab Potential Good   Clinical Impairments Affecting Rehab Potential History of R acoustic neuroma; surgically removed with "nerve severed" 2010 (no vestibular PT following)   PT Frequency 2x / week   PT Duration 6 weeks  plus eval   PT Treatment/Interventions ADLs/Self Care Home Management;Functional mobility training;Gait training;Therapeutic activities;Therapeutic exercise;Balance training;Neuromuscular re-education;Patient/family education;DME Instruction;Vestibular;Visual/perceptual remediation/compensation   PT Next Visit Plan Reassess LTG's-including SOT (2/9 pt's last scheduled visit--graduate vs extend?); If time, pt told to bring her physioball as she's had trouble filling it (to use for back pain ex's from prior PT episode) continue to work on balance activities and dynamic gait   PT Home Exercise Plan bridging (legs on physioball), seated UE ex's on physioball, seated clam exercise with red band (all from ortho OPPT); standing marching, heel raises, hip abdct ,forward stepups for RLE strengthening; VORx1, smooth pursuits, 180-360 turns habituation   Consulted and Agree with Plan of Care Patient      Patient will benefit from skilled therapeutic intervention in order to improve the following deficits and impairments:  Abnormal gait, Decreased balance, Decreased strength, Difficulty walking, Postural dysfunction, Decreased mobility, Decreased knowledge of use of DME, Dizziness, Other (comment) (impaired vestibular system)  Visit Diagnosis: Unsteadiness on feet  Other abnormalities of gait and mobility     Problem List Patient Active Problem List   Diagnosis Date Noted  . Prediabetes 07/20/2016  . Iron deficiency anemia 07/01/2016  . Chronic lumbar radiculopathy 06/17/2016  . Poor balance  05/24/2016  . At high risk for falls 05/24/2016  . Lumbosacral spondylosis without myelopathy 01/19/2016  . S/P excision of acoustic neuroma 01/19/2016  . Pruritus 01/19/2016  . Abdominal gas pain 01/19/2016  . B12 deficiency 10/17/2015  . Pernicious anemia 06/09/2015  . PVC (premature ventricular contraction) 02/25/2015  . Osteopenia, senile 01/12/2015  . Vitamin D deficiency 01/12/2015  . Nonspecific abnormal electrocardiogram (ECG) (EKG) 10/16/2014  . DIVERTICULITIS OF COLON 10/11/2009  . HYPERCHOLESTEROLEMIA, PURE 10/21/2008  . Essential hypertension 10/21/2008    Rexanne Mano, PT 09/19/2016, 10:20 PM  [facility] [address] [place], [place], [address] Phone: [phone]   Fax:  [phone]  Name: Emily Sweeney MRN: [id number] Date of Birth: Jun 16, 1941

## 2016-09-20 ENCOUNTER — Other Ambulatory Visit (INDEPENDENT_AMBULATORY_CARE_PROVIDER_SITE_OTHER): Payer: Medicare Other

## 2016-09-20 DIAGNOSIS — E538 Deficiency of other specified B group vitamins: Secondary | ICD-10-CM | POA: Diagnosis not present

## 2016-09-20 DIAGNOSIS — D509 Iron deficiency anemia, unspecified: Secondary | ICD-10-CM | POA: Diagnosis not present

## 2016-09-20 DIAGNOSIS — E559 Vitamin D deficiency, unspecified: Secondary | ICD-10-CM

## 2016-09-20 LAB — CBC WITH DIFFERENTIAL/PLATELET
BASOS ABS: 0.1 10*3/uL (ref 0.0–0.1)
Basophils Relative: 0.9 % (ref 0.0–3.0)
EOS PCT: 1.3 % (ref 0.0–5.0)
Eosinophils Absolute: 0.1 10*3/uL (ref 0.0–0.7)
HCT: 41.1 % (ref 36.0–46.0)
HEMOGLOBIN: 13.4 g/dL (ref 12.0–15.0)
Lymphocytes Relative: 30.6 % (ref 12.0–46.0)
Lymphs Abs: 2.4 10*3/uL (ref 0.7–4.0)
MCHC: 32.6 g/dL (ref 30.0–36.0)
MCV: 88.4 fl (ref 78.0–100.0)
MONO ABS: 0.9 10*3/uL (ref 0.1–1.0)
Monocytes Relative: 11.7 % (ref 3.0–12.0)
Neutro Abs: 4.4 10*3/uL (ref 1.4–7.7)
Neutrophils Relative %: 55.5 % (ref 43.0–77.0)
Platelets: 219 10*3/uL (ref 150.0–400.0)
RBC: 4.65 Mil/uL (ref 3.87–5.11)
RDW: 23.8 % — ABNORMAL HIGH (ref 11.5–15.5)
WBC: 7.8 10*3/uL (ref 4.0–10.5)

## 2016-09-20 LAB — FERRITIN: FERRITIN: 20.8 ng/mL (ref 10.0–291.0)

## 2016-09-20 LAB — IRON: IRON: 367 ug/dL — AB (ref 42–145)

## 2016-09-20 LAB — VITAMIN D 25 HYDROXY (VIT D DEFICIENCY, FRACTURES): VITD: 79.31 ng/mL (ref 30.00–100.00)

## 2016-09-20 LAB — VITAMIN B12: VITAMIN B 12: 385 pg/mL (ref 211–911)

## 2016-09-21 ENCOUNTER — Encounter: Payer: Self-pay | Admitting: Internal Medicine

## 2016-09-21 ENCOUNTER — Ambulatory Visit (INDEPENDENT_AMBULATORY_CARE_PROVIDER_SITE_OTHER): Payer: Medicare Other | Admitting: General Practice

## 2016-09-21 DIAGNOSIS — E538 Deficiency of other specified B group vitamins: Secondary | ICD-10-CM | POA: Diagnosis not present

## 2016-09-21 MED ORDER — CYANOCOBALAMIN 1000 MCG/ML IJ SOLN
1000.0000 ug | Freq: Once | INTRAMUSCULAR | Status: AC
  Administered 2016-09-21: 1000 ug via INTRAMUSCULAR

## 2016-09-21 NOTE — Progress Notes (Signed)
Injection given.   Tien Spooner J Dalisa Forrer, MD  

## 2016-09-22 ENCOUNTER — Ambulatory Visit: Payer: Medicare Other | Admitting: Physical Therapy

## 2016-09-25 DIAGNOSIS — M5416 Radiculopathy, lumbar region: Secondary | ICD-10-CM | POA: Diagnosis not present

## 2016-09-25 DIAGNOSIS — M9983 Other biomechanical lesions of lumbar region: Secondary | ICD-10-CM | POA: Diagnosis not present

## 2016-09-26 ENCOUNTER — Ambulatory Visit: Payer: Medicare Other | Admitting: Physical Therapy

## 2016-09-29 ENCOUNTER — Ambulatory Visit: Payer: Medicare Other | Admitting: Physical Therapy

## 2016-09-29 DIAGNOSIS — M5440 Lumbago with sciatica, unspecified side: Secondary | ICD-10-CM | POA: Diagnosis not present

## 2016-09-29 DIAGNOSIS — R2689 Other abnormalities of gait and mobility: Secondary | ICD-10-CM | POA: Diagnosis not present

## 2016-09-29 DIAGNOSIS — R42 Dizziness and giddiness: Secondary | ICD-10-CM

## 2016-09-29 DIAGNOSIS — R2681 Unsteadiness on feet: Secondary | ICD-10-CM | POA: Diagnosis not present

## 2016-09-29 DIAGNOSIS — M6281 Muscle weakness (generalized): Secondary | ICD-10-CM

## 2016-09-29 DIAGNOSIS — G8929 Other chronic pain: Secondary | ICD-10-CM

## 2016-09-29 DIAGNOSIS — R293 Abnormal posture: Secondary | ICD-10-CM | POA: Diagnosis not present

## 2016-09-29 NOTE — Therapy (Addendum)
[facility] [address] [place], [place], [address] Phone: [phone]   Fax:  [phone]  Physical Therapy Treatment  Patient Details  Name: Emily Sweeney MRN: [id number] Date of Birth: Jan 03, 1941 Referring Provider: Billey Gosling, MD  Encounter Date: 09/29/2016   09/29/16 1235  PT Visits / Re-Eval  Visit Number 8 (New episode of care)  Number of Visits 13  Date for PT Re-Evaluation 10/27/16  Authorization  Authorization Type Medicare Traditional/AARP-GCODE due every 10th visit  PT Time Calculation  PT Start Time 0845  PT Stop Time 0930  PT Time Calculation (min) 45 min  PT - End of Session  Equipment Utilized During Treatment Gait belt  Activity Tolerance Patient tolerated treatment well  Behavior During Therapy [facility] for tasks assessed/performed     Past Medical History:  Diagnosis Date  . Anemia   . Blood transfusion without reported diagnosis   . Diverticulosis of colon   . GERD (gastroesophageal reflux disease)   . Hearing loss    right ear, and tinnitus  . Hyperlipidemia   . Hypertension   . Tubular adenoma 03/05/2015   Polyp, and Benign lymphoid polyp.    Past Surgical History:  Procedure Laterality Date  . APPENDECTOMY    . BREAST BIOPSY     benign  . BREAST LUMPECTOMY    . COCHLEAR IMPLANT    . CRANIECTOMY FOR EXCISION OF ACOUSTIC NEUROMA  2009  . NASAL SEPTUM SURGERY    . TONSILLECTOMY      There were no vitals filed for this visit.      Subjective Assessment - 09/29/16 0918    Subjective Reports stiffness after recent epidural injection for back pain. Has been having LEFT leg pain since. Did fall ~10 days ago when knocked over keurig cups, "they went flying everywhere!" and in response she fell backwards into sitting on the floor.    Pertinent History lumbar spondylosis, osteopenia, acoustic neuroma removed on R side 2010 (pt corrected was not 2009 as previously reported)    Limitations Standing;Walking;House hold activities   Patient Stated Goals Pt would like to be able to improve balance.   Currently in Pain? Yes   Pain Score 3   sitting; worse in standing   Pain Location Leg   Pain Orientation Left   Pain Descriptors / Indicators Shooting   Pain Type Acute pain   Pain Radiating Towards LEFT anterior thigh   Pain Onset In the past 7 days   Pain Frequency Intermittent   Aggravating Factors  standing, walking   Pain Relieving Factors sitting   Multiple Pain Sites No                Vestibular Assessment - 09/29/16 0001      Balancemaster   Therapist, occupational Comment Pt's composite score: 38/100  Somatosensory ~82/100, Vision ~76/100, Vestibular ~1/100. Patient with h/o acoustic neuroma removal with vestibular nerve severed.                 [facility] Adult PT Treatment/Exercise - 09/29/16 0001      Ambulation/Gait   Gait velocity 2.96 ft/sec     Standardized Balance Assessment   Standardized Balance Assessment Berg Balance Test;Dynamic Gait Index;Balance Master Testing     Berg Balance Test   Sit to Stand Able to stand without using hands and stabilize independently   Standing Unsupported Able to stand safely 2 minutes   Sitting with Back Unsupported but Feet  Supported on Floor or Stool Able to sit safely and securely 2 minutes   Stand to Sit Sits safely with minimal use of hands   Transfers Able to transfer safely, minor use of hands   Standing Unsupported with Eyes Closed Able to stand 10 seconds safely   Standing Ubsupported with Feet Together Able to place feet together independently and stand 1 minute safely   From Standing, Reach Forward with Outstretched Arm Can reach confidently >25 cm (10")   From Standing Position, Pick up Object from Floor Able to pick up shoe safely and easily   From Standing Position, Turn to Look Behind Over each Shoulder Looks behind from both sides and weight  shifts well   Turn 360 Degrees Able to turn 360 degrees safely in 4 seconds or less   Standing Unsupported, Alternately Place Feet on Step/Stool Able to stand independently and safely and complete 8 steps in 20 seconds   Standing Unsupported, One Foot in Front Able to plae foot ahead of the other independently and hold 30 seconds   Standing on One Leg Tries to lift leg/unable to hold 3 seconds but remains standing independently   Total Score 52     Dynamic Gait Index   Level Surface Normal   Change in Gait Speed Normal   Gait with Horizontal Head Turns Mild Impairment   Gait with Vertical Head Turns Mild Impairment   Gait and Pivot Turn Normal   Step Over Obstacle Normal   Step Around Obstacles Normal   Steps Mild Impairment   Total Score 21                PT Education - 09/29/16 1234    Education provided Yes   Education Details Educated on results of objective, standardized tests (SOT, DGI, gait velocity) which were all significantly improved.    Person(s) Educated Patient   Methods Explanation;Handout   Comprehension Verbalized understanding          PT Short Term Goals - 09/07/16 0808      PT SHORT TERM GOAL #1   Title Pt will be independent with HEP to address balance and dynamic gait.  TARGET 08/25/16   Time 2   Period Weeks   Status Achieved     PT SHORT TERM GOAL #2   Title Pt will improve TUG score to less than or equal to 15 seconds for decreased fall risk.   Time 2   Period Weeks   Status Achieved     PT SHORT TERM GOAL #3   Title Pt will verbalize understanding of fall prevention in the home environment.   Time 2   Period Weeks   Status Achieved        09/29/16 1746  PT LONG TERM GOAL #1  Title Pt will improve Berg score to at least 42/56 for decreased fall risk.  TARGET 09/22/16  MET 09/29/16 with score 52/56  Time 6  Period Weeks  Status Achieved  PT LONG TERM GOAL #2  Title Pt will improve Dynamic Gait Index score to at least 15/24 for  decreased fall risk.  MET 09/29/16   21/24  Time 6  Period Weeks  Status Achieved  PT LONG TERM GOAL #3  Title Pt will imrpove gait velocity score to at least 2.62 ft/sec for improved gait efficiency and safety.    Baseline MET 09/29/16  2.96 ft/sec  Time 6  Period Weeks  Status Achieved  PT LONG TERM GOAL #4  Title Sensory Organization test to be performed, with goal to be written as appropriate.  Time 6  Period Weeks  Status Achieved  PT LONG TERM GOAL #5  Title Pt will improve ABC scale score on FOTO by at least 20% for improved balance confidence.  new target 10/27/16  Baseline ABC to be performed at d/c  Time 6  Period Weeks  Status On-going  Additional Long Term Goals  Additional Long Term Goals Yes  PT LONG TERM GOAL #6  Title Patient will improve composite SOT score by 10% and vision score to >/= 25/100 to demonstrate improved balance and safety. MET 09/29/16   Baseline SOT composite 22; vision 20;  09/29/16  composite 38, vision ~76  Time 5  Period Weeks  Status Achieved  PT LONG TERM GOAL #7  Title Improve SOT composite score to >= 42, with vision score >= 80 TARGET 10/27/16  Time 4  Period Weeks  Status New  PT LONG TERM GOAL #8  Title Able to walk >500 ft in 3 minutes on level surface that includes avoiding obstacles and head turns (simulating community ambulation) without LOB. target 10/27/16  Time 4  Period Weeks  Status New            09/29/16 1743  Plan  Clinical Impression Statement Patient has demonstrated the ability to progress her home exercises for VOR from sitting to standing and incr ease with ambulating with head movements or visual complexity. LTG's assessed this date with patient meeting 5 of 6 goals (goal for ABC scale not assessed via FOTO). Despite improvements, pt did experience a recent fall which involved visually complex scene. Due to her h/o acoustic neuroma, her vestibular score on the SOT remained ~1 out of 100. Patient in agreement with  plan to update goals for 4 week period (decreasing frequency to 1x/week) to allow further progression of her HEP and further improvement of her use of visual input in maintaining her balance.   Pt will benefit from skilled therapeutic intervention in order to improve on the following deficits Abnormal gait;Decreased balance;Decreased strength;Difficulty walking;Postural dysfunction;Decreased mobility;Decreased knowledge of use of DME;Dizziness;Other (comment) (impaired vestibular system)  Rehab Potential Good  Clinical Impairments Affecting Rehab Potential History of R acoustic neuroma; surgically removed with "nerve severed" 2010 (no vestibular PT following)  PT Frequency 1x / week  PT Duration 4 weeks  PT Treatment/Interventions ADLs/Self Care Home Management;Functional mobility training;Gait training;Therapeutic activities;Therapeutic exercise;Balance training;Neuromuscular re-education;Patient/family education;DME Instruction;Vestibular;Visual/perceptual remediation/compensation  PT Next Visit Plan BEgin 1x/wk for 4 weeks: Continue to advance her HEP emphasizing VOR exercises to strengthen her use of vision for maintaining balance (?  x2 exercises in sitting); improve use of hip strategy (SOT showed strong ankle dominance)  PT Home Exercise Plan bridging (legs on physioball), seated UE ex's on physioball, seated clam exercise with red band (all from ortho OPPT); standing marching, heel raises, hip abdct ,forward stepups for RLE strengthening; VORx1, smooth pursuits, 180-360 turns habituation  Consulted and Agree with Plan of Care Patient      Patient will benefit from skilled therapeutic intervention in order to improve the following deficits and impairments:  Abnormal gait, Decreased balance, Decreased strength, Difficulty walking, Postural dysfunction, Decreased mobility, Decreased knowledge of use of DME, Dizziness, Other (comment) (impaired vestibular system)  Visit Diagnosis: Unsteadiness  on feet  Other abnormalities of gait and mobility     Problem List Patient Active Problem List   Diagnosis Date Noted  . Prediabetes 07/20/2016  . Iron deficiency  anemia 07/01/2016  . Chronic lumbar radiculopathy 06/17/2016  . Poor balance 05/24/2016  . At high risk for falls 05/24/2016  . Lumbosacral spondylosis without myelopathy 01/19/2016  . S/P excision of acoustic neuroma 01/19/2016  . Pruritus 01/19/2016  . Abdominal gas pain 01/19/2016  . B12 deficiency 10/17/2015  . Pernicious anemia 06/09/2015  . PVC (premature ventricular contraction) 02/25/2015  . Osteopenia, senile 01/12/2015  . Vitamin D deficiency 01/12/2015  . Nonspecific abnormal electrocardiogram (ECG) (EKG) 10/16/2014  . DIVERTICULITIS OF COLON 10/11/2009  . HYPERCHOLESTEROLEMIA, PURE 10/21/2008  . Essential hypertension 10/21/2008    Rexanne Mano, PT 09/29/2016, 6:21 PM  [facility] [address] [place], [place], [address] Phone: [phone]   Fax:  [phone]  Name: Emily Sweeney MRN: [id number] Date of Birth: [birth year]

## 2016-10-02 NOTE — Addendum Note (Signed)
Addended by: Rexanne Mano on: 10/02/2016 08:48 AM   Modules accepted: Orders

## 2016-10-05 ENCOUNTER — Ambulatory Visit: Payer: Medicare Other | Admitting: Physical Therapy

## 2016-10-05 ENCOUNTER — Encounter: Payer: Self-pay | Admitting: Physical Therapy

## 2016-10-05 DIAGNOSIS — R2689 Other abnormalities of gait and mobility: Secondary | ICD-10-CM | POA: Diagnosis not present

## 2016-10-05 DIAGNOSIS — R293 Abnormal posture: Secondary | ICD-10-CM | POA: Diagnosis not present

## 2016-10-05 DIAGNOSIS — M5440 Lumbago with sciatica, unspecified side: Secondary | ICD-10-CM | POA: Diagnosis not present

## 2016-10-05 DIAGNOSIS — R42 Dizziness and giddiness: Secondary | ICD-10-CM | POA: Diagnosis not present

## 2016-10-05 DIAGNOSIS — R2681 Unsteadiness on feet: Secondary | ICD-10-CM | POA: Diagnosis not present

## 2016-10-05 DIAGNOSIS — M6281 Muscle weakness (generalized): Secondary | ICD-10-CM | POA: Diagnosis not present

## 2016-10-05 NOTE — Therapy (Signed)
[facility] [address] [place], [place], [address] Phone: [phone]   Fax:  [phone]  Physical Therapy Treatment  Patient Details  Name: Emily Sweeney MRN: [id number] Date of Birth: 1941/05/01 Referring Provider: Billey Gosling, MD  Encounter Date: 10/05/2016      PT End of Session - 10/05/16 1938    Visit Number 9  New episode of care   Number of Visits 13   Date for PT Re-Evaluation 10/27/16   Authorization Type Medicare Traditional/AARP-GCODE due every 10th visit   PT Start Time 0801   PT Stop Time 0845   PT Time Calculation (min) 44 min   Equipment Utilized During Treatment Gait belt   Activity Tolerance Patient tolerated treatment well;Patient limited by pain  Overall, tolerated well; some activities modified due to LBP   Behavior During Therapy [facility] for tasks assessed/performed      Past Medical History:  Diagnosis Date  . Anemia   . Blood transfusion without reported diagnosis   . Diverticulosis of colon   . GERD (gastroesophageal reflux disease)   . Hearing loss    right ear, and tinnitus  . Hyperlipidemia   . Hypertension   . Tubular adenoma 03/05/2015   Polyp, and Benign lymphoid polyp.    Past Surgical History:  Procedure Laterality Date  . APPENDECTOMY    . BREAST BIOPSY     benign  . BREAST LUMPECTOMY    . COCHLEAR IMPLANT    . CRANIECTOMY FOR EXCISION OF ACOUSTIC NEUROMA  2009  . NASAL SEPTUM SURGERY    . TONSILLECTOMY      There were no vitals filed for this visit.      Subjective Assessment - 10/05/16 0848    Subjective Reports recent fall as stepping up top step in a building she has meetings in 1 time per month. She has to push open door to step up final step, no handrail or grab bar. [address] forward onto her knees. Reports dizziness/lightheaded when looks upward.    Pertinent History lumbar spondylosis, osteopenia, acoustic neuroma removed on R side 2010 (pt corrected was  not 2009 as previously reported)   Limitations Standing;Walking;House hold activities   Patient Stated Goals Pt would like to be able to improve balance.   Currently in Pain? No/denies   Pain Onset --                Vestibular Assessment - 10/05/16 1922      Symptom Behavior   Type of Dizziness Lightheadedness   Frequency of Dizziness intermittent   Duration of Dizziness brief/seconds   Aggravating Factors Looking up to the ceiling;Forward bending   Relieving Factors Slow movements     Occulomotor Exam   Occulomotor Alignment Normal     Vestibulo-Occular Reflex   Comment Head impulse test +bil (rt due to prior acoustic neuroma surgery)     Visual Acuity   Static line 7   Dynamic line 2     Positional Testing   Sidelying Test Sidelying Right;Sidelying Left   Horizontal Canal Testing Horizontal Canal Right;Horizontal Canal Left     Sidelying Right   Sidelying Right Duration 0   Sidelying Right Symptoms No nystagmus     Sidelying Left   Sidelying Left Duration 0   Sidelying Left Symptoms No nystagmus     Horizontal Canal Right   Horizontal Canal Right Duration 0   Horizontal Canal Right Symptoms Normal     Horizontal Canal Left  Horizontal Canal Left Duration 0   Horizontal Canal Left Symptoms Normal     Environmental education officer Comment see previous note                  Vestibular Treatment/Exercise - 10/05/16 1927      Vestibular Treatment/Exercise   Vestibular Treatment Provided Gaze   Gaze Exercises X1 Viewing Horizontal;X1 Viewing Vertical     180 degree Turns   Number of Reps  5  1/4 +1/4 turns; bil   Symptom Description  noted difficulty finding target to fixate upon; imbalance with incr speed     X1 Viewing Horizontal   Foot Position >= shoulder width   Reps 3   Comments 30+; educated on incr velocity, incr turn to Rt (tends to turn head lt and back to center)     X1 Viewing Vertical   Foot Position >= shoulder width    Reps 3   Comments 30+; incr velocity            Balance Exercises - 10/05/16 1932      Balance Exercises: Standing   Standing Eyes Closed Wide (BOA);Foam/compliant surface;3 reps;20 secs  incr sway ant-post   Wall Bumps Hip   Wall Bumps-Hips Eyes opened;Eyes closed;Anterior/posterior;Foam/compliant surface;20 reps  max verbal and tactile cues for proper technique initially           PT Education - 10/05/16 1935    Education provided Yes   Education Details Increasing velocity of VORx1 exercises; rotation and lateral flexion cervical ROM stretches x 10-30 seconds (after using heating pad) and gentle contract-relax. Educated on results of today's assessments.   Person(s) Educated Patient   Methods Explanation;Demonstration;Tactile cues;Verbal cues   Comprehension Verbalized understanding;Returned demonstration;Verbal cues required;Tactile cues required;Need further instruction          PT Short Term Goals - 09/07/16 9295      PT SHORT TERM GOAL #1   Title Pt will be independent with HEP to address balance and dynamic gait.  TARGET 08/25/16   Time 2   Period Weeks   Status Achieved     PT SHORT TERM GOAL #2   Title Pt will improve TUG score to less than or equal to 15 seconds for decreased fall risk.   Time 2   Period Weeks   Status Achieved     PT SHORT TERM GOAL #3   Title Pt will verbalize understanding of fall prevention in the home environment.   Time 2   Period Weeks   Status Achieved           PT Long Term Goals - 09/29/16 1746      PT LONG TERM GOAL #1   Title Pt will improve Berg score to at least 42/56 for decreased fall risk.  TARGET 09/22/16  MET 09/29/16 with score 52/56   Time 6   Period Weeks   Status Achieved     PT LONG TERM GOAL #2   Title Pt will improve Dynamic Gait Index score to at least 15/24 for decreased fall risk.  MET 09/29/16   21/24   Time 6   Period Weeks   Status Achieved     PT LONG TERM GOAL #3   Title Pt will imrpove  gait velocity score to at least 2.62 ft/sec for improved gait efficiency and safety.     Baseline MET 09/29/16  2.96 ft/sec   Time 6   Period Weeks   Status Achieved  PT LONG TERM GOAL #4   Title Sensory Organization test to be performed, with goal to be written as appropriate.   Time 6   Period Weeks   Status Achieved     PT LONG TERM GOAL #5   Title Pt will improve ABC scale score on FOTO by at least 20% for improved balance confidence.  new target 10/27/16   Baseline ABC to be performed at d/c   Time 6   Period Weeks   Status On-going     Additional Long Term Goals   Additional Long Term Goals Yes     PT LONG TERM GOAL #6   Title Patient will improve composite SOT score by 10% and vision score to >/= 25/100 to demonstrate improved balance and safety. MET 09/29/16    Baseline SOT composite 22; vision 20;  09/29/16  composite 38, vision ~76   Time 5   Period Weeks   Status Achieved     PT LONG TERM GOAL #7   Title Improve SOT composite score to >= 42, with vision score >= 80 TARGET 10/27/16   Time 4   Period Weeks   Status New     PT LONG TERM GOAL #8   Title Able to walk >500 ft in 3 minutes on level surface that includes avoiding obstacles and head turns (simulating community ambulation) without LOB. target 10/27/16   Time 4   Period Weeks   Status New               Plan - 10/05/16 1940    Clinical Impression Statement Further assessment of patient's VOR completed with +head impulse test bilaterally and 5 line change between static visual acuity and dynamic visual acuity. Focused on improving/advancing patient's technique with VOR x1 exercises with noted significant lack of head turn to pt's right (partly due to limited cervical ROM). Instructed in cervical ROM stretches with goal to improve ability to turn head and stimulate her vestibular system.    Rehab Potential Good   Clinical Impairments Affecting Rehab Potential History of R acoustic neuroma; surgically  removed with "nerve severed" 2010 (no vestibular PT following)   PT Frequency 1x / week   PT Duration 4 weeks   PT Treatment/Interventions ADLs/Self Care Home Management;Functional mobility training;Gait training;Therapeutic activities;Therapeutic exercise;Balance training;Neuromuscular re-education;Patient/family education;DME Instruction;Vestibular;Visual/perceptual remediation/compensation   PT Next Visit Plan 10th visit G-code & PN (see recent updated LTG's for values); Wall bumps for technique (on foam and EC), bending/reaching overhead activities (compliant surface); walking with sudden stops, changes in direction (side steps, backwards); stepping strategy   PT Home Exercise Plan bridging (legs on physioball), seated UE ex's on physioball, seated clam exercise with red band (all from ortho OPPT); standing marching, heel raises, hip abdct ,forward stepups for RLE strengthening; VORx1, smooth pursuits, 180-360 turns habituation   Consulted and Agree with Plan of Care Patient      Patient will benefit from skilled therapeutic intervention in order to improve the following deficits and impairments:  Abnormal gait, Decreased balance, Decreased strength, Difficulty walking, Postural dysfunction, Decreased mobility, Decreased knowledge of use of DME, Dizziness, Other (comment) (impaired vestibular system)  Visit Diagnosis: Unsteadiness on feet  Other abnormalities of gait and mobility  Muscle weakness (generalized)  Dizziness and giddiness     Problem List Patient Active Problem List   Diagnosis Date Noted  . Prediabetes 07/20/2016  . Iron deficiency anemia 07/01/2016  . Chronic lumbar radiculopathy 06/17/2016  . Poor balance 05/24/2016  . At  high risk for falls 05/24/2016  . Lumbosacral spondylosis without myelopathy 01/19/2016  . S/P excision of acoustic neuroma 01/19/2016  . Pruritus 01/19/2016  . Abdominal gas pain 01/19/2016  . B12 deficiency 10/17/2015  . Pernicious anemia  06/09/2015  . PVC (premature ventricular contraction) 02/25/2015  . Osteopenia, senile 01/12/2015  . Vitamin D deficiency 01/12/2015  . Nonspecific abnormal electrocardiogram (ECG) (EKG) 10/16/2014  . DIVERTICULITIS OF COLON 10/11/2009  . HYPERCHOLESTEROLEMIA, PURE 10/21/2008  . Essential hypertension 10/21/2008    Rexanne Mano, PT 10/05/2016, 7:51 PM  [facility] [address] [place], [place], [address] Phone: [phone]   Fax:  [phone]  Name: Emily Sweeney MRN: [id number] Date of Birth: 16-Sep-1940

## 2016-10-11 ENCOUNTER — Ambulatory Visit: Payer: Medicare Other | Admitting: Physical Therapy

## 2016-10-16 DIAGNOSIS — M5416 Radiculopathy, lumbar region: Secondary | ICD-10-CM | POA: Diagnosis not present

## 2016-10-16 DIAGNOSIS — M9983 Other biomechanical lesions of lumbar region: Secondary | ICD-10-CM | POA: Diagnosis not present

## 2016-10-19 ENCOUNTER — Ambulatory Visit: Payer: Medicare Other | Attending: Internal Medicine | Admitting: Physical Therapy

## 2016-10-19 ENCOUNTER — Encounter: Payer: Self-pay | Admitting: Physical Therapy

## 2016-10-19 DIAGNOSIS — M6281 Muscle weakness (generalized): Secondary | ICD-10-CM | POA: Insufficient documentation

## 2016-10-19 DIAGNOSIS — R2681 Unsteadiness on feet: Secondary | ICD-10-CM | POA: Diagnosis not present

## 2016-10-19 DIAGNOSIS — Z9181 History of falling: Secondary | ICD-10-CM | POA: Diagnosis not present

## 2016-10-19 DIAGNOSIS — R2689 Other abnormalities of gait and mobility: Secondary | ICD-10-CM | POA: Insufficient documentation

## 2016-10-19 NOTE — Therapy (Signed)
[facility] [address] [place], [place], [address] Phone: [phone]   Fax:  [phone]  Physical Therapy Treatment  Patient Details  Name: Emily Sweeney MRN: [id number] Date of Birth: [birth year] Referring Provider: Billey Gosling, MD  Encounter Date: 10/19/2016      PT End of Session - 10/19/16 1745    Visit Number 10  New episode of care   Number of Visits 17  see 2/16 update POC (1x/wk x 4 wks)   Date for PT Re-Evaluation 10/27/16   Authorization Type Medicare Traditional/AARP-GCODE due every 10th visit   PT Start Time 0848   PT Stop Time 0931   PT Time Calculation (min) 43 min   Equipment Utilized During Treatment Gait belt   Activity Tolerance Patient tolerated treatment well;Patient limited by pain  Overall, tolerated well; some activities modified due to LBP   Behavior During Therapy [facility] for tasks assessed/performed      Past Medical History:  Diagnosis Date  . Anemia   . Blood transfusion without reported diagnosis   . Diverticulosis of colon   . GERD (gastroesophageal reflux disease)   . Hearing loss    right ear, and tinnitus  . Hyperlipidemia   . Hypertension   . Tubular adenoma 03/05/2015   Polyp, and Benign lymphoid polyp.    Past Surgical History:  Procedure Laterality Date  . APPENDECTOMY    . BREAST BIOPSY     benign  . BREAST LUMPECTOMY    . COCHLEAR IMPLANT    . CRANIECTOMY FOR EXCISION OF ACOUSTIC NEUROMA  2009  . NASAL SEPTUM SURGERY    . TONSILLECTOMY      There were no vitals filed for this visit.      Subjective Assessment - 10/19/16 1719    Subjective Reports has been doing ex's less due to respiratory illness. Continues to feel moderately off-balance when she does VOR x 1 ex's. Asking questions re: other eye exercise given last visit.   Pertinent History lumbar spondylosis, osteopenia, acoustic neuroma removed on R side 2010 (pt corrected was not 2009 as previously  reported)   Limitations Standing;Walking;House hold activities   Patient Stated Goals Pt would like to be able to improve balance.   Currently in Pain? No/denies                         [facility] Adult PT Treatment/Exercise - 10/19/16 1726      Transfers   Sit to Stand 6: Modified independent (Device/Increase time)   Stand to Sit 6: Modified independent (Device/Increase time)     Ambulation/Gait   Ambulation/Gait Assistance 4: Min guard   Ambulation/Gait Assistance Details with horizontal, vertical, diagonal head turns, tossing/catching large ball x 50 ft x 10   Assistive device None   Gait Pattern Step-through pattern;Decreased arm swing - right;Decreased arm swing - left;Decreased step length - right;Decreased step length - left;Wide base of support;Decreased trunk rotation   Ambulation Surface Level;Indoor     Exercises   Exercises Neck     Neck Exercises: Seated   Other Seated Exercise reviewed cervical rotation, lateral flexion pt has been working on for HEP with noted improvement and better head turning with VOR ex's         Vestibular Treatment/Exercise - 10/19/16 1722      Vestibular Treatment/Exercise   Gaze Exercises X1 Viewing Horizontal;Eye/Head Exercise Horizontal     X1 Viewing Horizontal   Foot Position seated (pt  had reported difficulty in standing, however did well)   Reps 1   Comments 30 sec     Eye/Head Exercise Horizontal   Foot Position seated   Reps 1   Comments saccades; pt reported attempted at home and gets moderately dizzy; educated on targets not so far apart            Balance Exercises - 10/19/16 1730      Balance Exercises: Standing   Wall Bumps Hip   Wall Bumps-Hips Eyes opened;Eyes closed;Anterior/posterior;Foam/compliant surface;20 reps   Rockerboard Anterior/posterior;Lateral;EO;EC;30 seconds;Intermittent UE support  fwd difficult keep level without UEs; hip flex to touch back   Other Standing Exercises using small  ball with bil UEs, touching targets on the wall moving vertically (including bending down) and horizontally; pt denied imbalance/vertigo           PT Education - 10/19/16 1736    Education provided Yes   Education Details Reviewed/answered questions re: HEP   Person(s) Educated Patient   Methods Explanation;Demonstration;Handout;Verbal cues   Comprehension Verbalized understanding;Returned demonstration;Need further instruction          PT Short Term Goals - 09/07/16 2458      PT SHORT TERM GOAL #1   Title Pt will be independent with HEP to address balance and dynamic gait.  TARGET 08/25/16   Time 2   Period Weeks   Status Achieved     PT SHORT TERM GOAL #2   Title Pt will improve TUG score to less than or equal to 15 seconds for decreased fall risk.   Time 2   Period Weeks   Status Achieved     PT SHORT TERM GOAL #3   Title Pt will verbalize understanding of fall prevention in the home environment.   Time 2   Period Weeks   Status Achieved           PT Long Term Goals - 09/29/16 1746      PT LONG TERM GOAL #1   Title Pt will improve Berg score to at least 42/56 for decreased fall risk.  TARGET 09/22/16  MET 09/29/16 with score 52/56   Time 6   Period Weeks   Status Achieved     PT LONG TERM GOAL #2   Title Pt will improve Dynamic Gait Index score to at least 15/24 for decreased fall risk.  MET 09/29/16   21/24   Time 6   Period Weeks   Status Achieved     PT LONG TERM GOAL #3   Title Pt will imrpove gait velocity score to at least 2.62 ft/sec for improved gait efficiency and safety.     Baseline MET 09/29/16  2.96 ft/sec   Time 6   Period Weeks   Status Achieved     PT LONG TERM GOAL #4   Title Sensory Organization test to be performed, with goal to be written as appropriate.   Time 6   Period Weeks   Status Achieved     PT LONG TERM GOAL #5   Title Pt will improve ABC scale score on FOTO by at least 20% for improved balance confidence.  new target  10/27/16   Baseline ABC to be performed at d/c   Time 6   Period Weeks   Status On-going     Additional Long Term Goals   Additional Long Term Goals Yes     PT LONG TERM GOAL #6   Title Patient will improve composite SOT score  by 10% and vision score to >/= 25/100 to demonstrate improved balance and safety. MET 09/29/16    Baseline SOT composite 22; vision 20;  09/29/16  composite 38, vision ~76   Time 5   Period Weeks   Status Achieved     PT LONG TERM GOAL #7   Title Improve SOT composite score to >= 42, with vision score >= 80 TARGET 10/27/16   Time 4   Period Weeks   Status New     PT LONG TERM GOAL #8   Title Able to walk >500 ft in 3 minutes on level surface that includes avoiding obstacles and head turns (simulating community ambulation) without LOB. target 10/27/16   Time 4   Period Weeks   Status New               Plan - 10/24/2016 1746    Clinical Impression Statement Patient missed a week of therapy due to respiratory illness. Session focused on reviewing technique for HEP and vestibular rehab activities (gait with head turns, habituation). Patient continues to experience imbalance with head turns or physical turns. Anticipate improvement as she is feeling better and able to resume HEP.   Rehab Potential Good   Clinical Impairments Affecting Rehab Potential History of R acoustic neuroma; surgically removed with "nerve severed" 24-Oct-2008 (no vestibular PT following)   PT Frequency 1x / week   PT Duration 4 weeks   PT Treatment/Interventions ADLs/Self Care Home Management;Functional mobility training;Gait training;Therapeutic activities;Therapeutic exercise;Balance training;Neuromuscular re-education;Patient/family education;DME Instruction;Vestibular;Visual/perceptual remediation/compensation   PT Next Visit Plan 3/14 last scheduled visit; bending/reaching and then turn 90-180 degrees activities (solid vs compliant surface); walking with sudden stops, changes in direction  (side steps, backwards); stepping strategy; head turn activities   PT Home Exercise Plan bridging (legs on physioball), seated UE ex's on physioball, seated clam exercise with red band (all from ortho OPPT); standing marching, heel raises, hip abdct ,forward stepups for RLE strengthening; VORx1, smooth pursuits, 180-360 turns habituation   Consulted and Agree with Plan of Care Patient      Patient will benefit from skilled therapeutic intervention in order to improve the following deficits and impairments:  Abnormal gait, Decreased balance, Decreased strength, Difficulty walking, Postural dysfunction, Decreased mobility, Decreased knowledge of use of DME, Dizziness, Other (comment) (impaired vestibular system)  Visit Diagnosis: Poor balance  At high risk for falls       G-Codes - 10/24/2016 1953    Functional Assessment Tool Used (Outpatient Only) Gait velocity (08-11-16 2.24 ft/sec; 09-29-16 2.96 ft/sec) Merrilee Jansky (08-11-16  37/56, 09-29-16  52/56); DGI (08-11-16  9/24, 09-29-16 21/24)   Functional Limitation Mobility: Walking and moving around   Mobility: Walking and Moving Around Current Status [phone]) At least 20 percent but less than 40 percent impaired, limited or restricted   Mobility: Walking and Moving Around Goal Status [phone]) At least 20 percent but less than 40 percent impaired, limited or restricted     Physical Therapy Progress Note  Dates of Reporting Period: 08/11/16 to 2016/10/24  Objective Reports of Subjective Statement:  Objective Measurements: see G code above  Goal Update: done 09/29/16  Plan: Continue balance and vestibular rehab to decr fall risk  Reason Skilled Services are Required: Patient has had 2 recent falls    Problem List Patient Active Problem List   Diagnosis Date Noted  . Prediabetes 07/20/2016  . Iron deficiency anemia 07/01/2016  . Chronic lumbar radiculopathy 06/17/2016  . Poor balance 05/24/2016  . At high risk  for falls 05/24/2016  .  Lumbosacral spondylosis without myelopathy 01/19/2016  . S/P excision of acoustic neuroma 01/19/2016  . Pruritus 01/19/2016  . Abdominal gas pain 01/19/2016  . B12 deficiency 10/17/2015  . Pernicious anemia 06/09/2015  . PVC (premature ventricular contraction) 02/25/2015  . Osteopenia, senile 01/12/2015  . Vitamin D deficiency 01/12/2015  . Nonspecific abnormal electrocardiogram (ECG) (EKG) 10/16/2014  . DIVERTICULITIS OF COLON 10/11/2009  . HYPERCHOLESTEROLEMIA, PURE 10/21/2008  . Essential hypertension 10/21/2008    Rexanne Mano, PT 10/19/2016, 8:00 PM  [facility] [address] [place], [place], [address] Phone: [phone]   Fax:  [phone]  Name: Emily Sweeney MRN: [id number] Date of Birth: [birth year]

## 2016-10-19 NOTE — Patient Instructions (Signed)
Oculomotor: Saccades    Holding two targets positioned side by side _12-18___ inches apart, move eyes quickly from target to target as head stays still. Move __30__ seconds each direction. Perform sitting. Progress to standing if symptoms are consistently <4/10 Repeat ___3_ times per session. Do __1__ sessions per day.    Copyright  VHI. All rights reserved.    Remember to work on standing on pillow, feet apart and closing your eyes. Goal 30 seconds. Can set "short-term goals" ie 10 sec

## 2016-10-25 ENCOUNTER — Encounter: Payer: Self-pay | Admitting: Physical Therapy

## 2016-10-25 ENCOUNTER — Ambulatory Visit: Payer: Medicare Other | Admitting: Physical Therapy

## 2016-10-25 DIAGNOSIS — R2689 Other abnormalities of gait and mobility: Secondary | ICD-10-CM | POA: Diagnosis not present

## 2016-10-25 DIAGNOSIS — M6281 Muscle weakness (generalized): Secondary | ICD-10-CM | POA: Diagnosis not present

## 2016-10-25 DIAGNOSIS — R2681 Unsteadiness on feet: Secondary | ICD-10-CM

## 2016-10-25 DIAGNOSIS — Z9181 History of falling: Secondary | ICD-10-CM | POA: Diagnosis not present

## 2016-10-25 NOTE — Therapy (Signed)
[facility] [address] [place], [place], [address] Phone: [phone]   Fax:  [phone]  Physical Therapy Treatment  Patient Details  Name: Emily Sweeney MRN: [id number] Date of Birth: 1940-12-11 Referring Provider: Billey Gosling, MD  Encounter Date: 10/25/2016      PT End of Session - 10/25/16 1331    Visit Number 11  New episode of care   Number of Visits 17  see 2/16 update POC (1x/wk x 4 wks)   Date for PT Re-Evaluation 11/03/16  updated due to pt missed one week of PT due to illness (2/26-3/2)   Authorization Type Medicare Traditional/AARP-GCODE due every 10th visit   PT Start Time 0845   PT Stop Time 0928   PT Time Calculation (min) 43 min   Equipment Utilized During Treatment Gait belt   Activity Tolerance Patient tolerated treatment well  Overall, tolerated well; some activities modified due to LBP   Behavior During Therapy [facility] for tasks assessed/performed      Past Medical History:  Diagnosis Date  . Anemia   . Blood transfusion without reported diagnosis   . Diverticulosis of colon   . GERD (gastroesophageal reflux disease)   . Hearing loss    right ear, and tinnitus  . Hyperlipidemia   . Hypertension   . Tubular adenoma 03/05/2015   Polyp, and Benign lymphoid polyp.    Past Surgical History:  Procedure Laterality Date  . APPENDECTOMY    . BREAST BIOPSY     benign  . BREAST LUMPECTOMY    . COCHLEAR IMPLANT    . CRANIECTOMY FOR EXCISION OF ACOUSTIC NEUROMA  2009  . NASAL SEPTUM SURGERY    . TONSILLECTOMY      There were no vitals filed for this visit.      Subjective Assessment - 10/25/16 1317    Subjective Feeling better. Finally stopped coughing. Has gotten back in the routine of doing her exercises.    Pertinent History lumbar spondylosis, osteopenia, acoustic neuroma removed on R side 2010 (pt corrected was not 2009 as previously reported)   Limitations  Standing;Walking;House hold activities   Patient Stated Goals Pt would like to be able to improve balance.   Currently in Pain? No/denies                         [facility] Adult PT Treatment/Exercise - 10/25/16 1318      Transfers   Sit to Stand 6: Modified independent (Device/Increase time)   Stand to Sit 6: Modified independent (Device/Increase time)     Ambulation/Gait   Ambulation/Gait Assistance 4: Min guard   Ambulation/Gait Assistance Details closeguarding while ambulating obstacle course which included uneven surface, compliant surfaces, tight turns, up/down curb, and over obstacle. Pt with 2 minor LOB which she was able to recover independently   Ambulation Distance (Feet) 500 Feet   Gait Pattern Step-through pattern;Decreased arm swing - right;Decreased arm swing - left;Decreased step length - right;Decreased step length - left;Wide base of support;Decreased trunk rotation   Ambulation Surface Level;Indoor   Curb --  minguard; up/down x 5   Gait Comments worked on more narrow base of support as walking over unlevel compliant surface              Balance Exercises - 10/25/16 1322      Balance Exercises: Standing   Standing Eyes Opened Narrow base of support (BOS);Head turns;Foam/compliant surface  10 reps horizontal and vertical  Standing Eyes Closed Narrow base of support (BOS);Foam/compliant surface;3 reps;20 secs;30 secs   Tandem Stance Eyes open;Eyes closed;Foam/compliant surface;Intermittent upper extremity support;5 reps;20 secs  up to 20 sec   SLS Eyes open;Foam/compliant surface  <5 seconds; changed to slow march x10 ea leg   Stepping Strategy Anterior;Posterior;Foam/compliant surface;10 reps  alternating legs   Rockerboard Anterior/posterior;Lateral;EO;EC;30 seconds;Intermittent UE support  fwd difficult keep level without UEs; hip flex to touch back   Balance Beam crosswise on black beam x 30 seconds   Gait with Head Turns  Forward;Foam/compliant surface;4 reps  length red mat   Step Over Hurdles / Cones part of obstacle course   Other Standing Exercises see ambulation re: obstacle course           PT Education - 10/25/16 1330    Education provided Yes   Education Details Reviewed updted HEP for corner balance exercises and how to progress from wide base to narrow, EO to head turns to [facility]   Person(s) Educated Patient   Methods Explanation;Demonstration;Handout   Comprehension Verbalized understanding;Returned demonstration          PT Short Term Goals - 09/07/16 0808      PT SHORT TERM GOAL #1   Title Pt will be independent with HEP to address balance and dynamic gait.  TARGET 08/25/16   Time 2   Period Weeks   Status Achieved     PT SHORT TERM GOAL #2   Title Pt will improve TUG score to less than or equal to 15 seconds for decreased fall risk.   Time 2   Period Weeks   Status Achieved     PT SHORT TERM GOAL #3   Title Pt will verbalize understanding of fall prevention in the home environment.   Time 2   Period Weeks   Status Achieved           PT Long Term Goals - 09/29/16 1746      PT LONG TERM GOAL #1   Title Pt will improve Berg score to at least 42/56 for decreased fall risk.  TARGET 09/22/16  MET 09/29/16 with score 52/56   Time 6   Period Weeks   Status Achieved     PT LONG TERM GOAL #2   Title Pt will improve Dynamic Gait Index score to at least 15/24 for decreased fall risk.  MET 09/29/16   21/24   Time 6   Period Weeks   Status Achieved     PT LONG TERM GOAL #3   Title Pt will imrpove gait velocity score to at least 2.62 ft/sec for improved gait efficiency and safety.     Baseline MET 09/29/16  2.96 ft/sec   Time 6   Period Weeks   Status Achieved     PT LONG TERM GOAL #4   Title Sensory Organization test to be performed, with goal to be written as appropriate.   Time 6   Period Weeks   Status Achieved     PT LONG TERM GOAL #5   Title Pt will improve ABC  scale score on FOTO by at least 20% for improved balance confidence.  new target 10/27/16   Baseline ABC to be performed at d/c   Time 6   Period Weeks   Status On-going     Additional Long Term Goals   Additional Long Term Goals Yes     PT LONG TERM GOAL #6   Title Patient will improve composite SOT score by  10% and vision score to >/= 25/100 to demonstrate improved balance and safety. MET 09/29/16    Baseline SOT composite 22; vision 20;  09/29/16  composite 38, vision ~76   Time 5   Period Weeks   Status Achieved     PT LONG TERM GOAL #7   Title Improve SOT composite score to >= 42, with vision score >= 80 TARGET 10/27/16   Time 4   Period Weeks   Status New     PT LONG TERM GOAL #8   Title Able to walk >500 ft in 3 minutes on level surface that includes avoiding obstacles and head turns (simulating community ambulation) without LOB. target 10/27/16   Time 4   Period Weeks   Status New               Plan - 10/25/16 1334    Clinical Impression Statement Originally this was scheduled to be patient's last visit, however due to missing a week of therapy due to illness (2/26-3/2) she wanted to makeup that visit next week. LTGs will be assessed at that time. Session focused on updating her HEP for corner balance exercises via more narrow BOS, head turns, and EC. Patient continues to lose her balance consistently when she closes her eyes. She does note that overall she can tell she has improved and verbalized understanding of need to continue VOR exercises daily even after discharge.    Rehab Potential Good   Clinical Impairments Affecting Rehab Potential History of R acoustic neuroma; surgically removed with "nerve severed" 2010 (no vestibular PT following)   PT Frequency 1x / week   PT Duration 4 weeks   PT Treatment/Interventions ADLs/Self Care Home Management;Functional mobility training;Gait training;Therapeutic activities;Therapeutic exercise;Balance training;Neuromuscular  re-education;Patient/family education;DME Instruction;Vestibular;Visual/perceptual remediation/compensation   PT Next Visit Plan 3/22 d/c visit; check LTGs;  bending/reaching and then turn 90-180 degrees activities (solid vs compliant surface); walking with sudden stops, changes in direction (side steps, backwards); stepping strategy; head turn activities   PT Home Exercise Plan bridging (legs on physioball), seated UE ex's on physioball, seated clam exercise with red band (all from ortho OPPT); standing marching, heel raises, hip abdct ,forward stepups for RLE strengthening; VORx1, smooth pursuits, 180-360 turns habituation   Consulted and Agree with Plan of Care Patient      Patient will benefit from skilled therapeutic intervention in order to improve the following deficits and impairments:  Abnormal gait, Decreased balance, Decreased strength, Difficulty walking, Postural dysfunction, Decreased mobility, Decreased knowledge of use of DME, Dizziness, Other (comment) (impaired vestibular system)  Visit Diagnosis: Poor balance  At high risk for falls  Unsteadiness on feet  Other abnormalities of gait and mobility     Problem List Patient Active Problem List   Diagnosis Date Noted  . Prediabetes 07/20/2016  . Iron deficiency anemia 07/01/2016  . Chronic lumbar radiculopathy 06/17/2016  . Poor balance 05/24/2016  . At high risk for falls 05/24/2016  . Lumbosacral spondylosis without myelopathy 01/19/2016  . S/P excision of acoustic neuroma 01/19/2016  . Pruritus 01/19/2016  . Abdominal gas pain 01/19/2016  . B12 deficiency 10/17/2015  . Pernicious anemia 06/09/2015  . PVC (premature ventricular contraction) 02/25/2015  . Osteopenia, senile 01/12/2015  . Vitamin D deficiency 01/12/2015  . Nonspecific abnormal electrocardiogram (ECG) (EKG) 10/16/2014  . DIVERTICULITIS OF COLON 10/11/2009  . HYPERCHOLESTEROLEMIA, PURE 10/21/2008  . Essential hypertension 10/21/2008    Zena Amos, PT 10/25/2016, 1:40 PM  [facility] [address] [place], [place], [address] Phone: [phone]   Fax:  [phone]  Name: Emily Sweeney MRN: [id number] Date of Birth: 04/08/1941

## 2016-10-25 NOTE — Patient Instructions (Signed)
Balance: Eyes Closed - Bilateral (Varied Surfaces)    Stand, feet shoulder width, close eyes. Maintain balance 30 seconds. Repeat 3 times per set. Do 3 sessions per day. Do 5 sessions per week. Repeat on compliant surface: foam.  Copyright  VHI. All rights reserved.  Feet Apart, Head Motion - Eyes Closed   With eyes closed and feet shoulder width apart, move head slowly, up and down. Repeat 10 times per session. Do 3 sessions per day.  Copyright  VHI. All rights reserved.  Feet Together, Head Motion - Eyes Closed      With eyes closed and feet together, move head slowly, up and down. Repeat 10 times per session. Do 3 sessions per day.  Copyright  VHI. All rights reserved.  Feet Partial Heel-Toe, Head Motion - Eyes Closed   With eyes closed and right foot partially in front of the other, move head slowly, up and down. Repeat 10 times per session. Do 3 sessions per day.  Copyright  VHI. All rights reserved.  Feet Apart (Compliant Surface) Head Motion - Eyes Closed    Stand on compliant surface:  with feet shoulder width apart. Close eyes and move head slowly, up and down. Repeat 10 times per session. Do 3 sessions per day.  Copyright  VHI. All rights reserved.  Feet Together (Compliant Surface) Head Motion - Eyes Closed    Stand on compliant surface:  with feet together. Close eyes and move head slowly, up and down. Repeat 10 times per session. Do 3 sessions per day.  Copyright  VHI. All rights reserved.  Feet Partial Heel-Toe (Compliant Surface) Head Motion - Eyes Closed    Stand on compliant surface:  with right foot partially in front of the other. Close eyes and move head slowly, up and down. Repeat 30 seconds times per session. Do 3 sessions per day.  Copyright  VHI. All rights reserved.

## 2016-11-02 ENCOUNTER — Ambulatory Visit: Payer: Medicare Other | Admitting: Physical Therapy

## 2016-11-02 ENCOUNTER — Encounter: Payer: Self-pay | Admitting: Physical Therapy

## 2016-11-02 DIAGNOSIS — Z9181 History of falling: Secondary | ICD-10-CM | POA: Diagnosis not present

## 2016-11-02 DIAGNOSIS — R2689 Other abnormalities of gait and mobility: Secondary | ICD-10-CM | POA: Diagnosis not present

## 2016-11-02 DIAGNOSIS — M6281 Muscle weakness (generalized): Secondary | ICD-10-CM

## 2016-11-02 DIAGNOSIS — R2681 Unsteadiness on feet: Secondary | ICD-10-CM | POA: Diagnosis not present

## 2016-11-02 NOTE — Patient Instructions (Signed)
(  Home) Knee: Extension - Sitting    Tie band around your ankles, sit with knee over edge of chair. Pull left foot back underneath chair with foot flat. Straighten right knee. Repeat ___10_ times per set. Do __1__ sets per session. Do ___5_ sessions per week.   Copyright  VHI. All rights reserved.    Mini Squat: Double Leg    SEATED: hold black band in both hands, step arch of right foot onto band. Bring knee to chest and extend Rt leg to "lengthen the band". (Using hip and knee).    With feet shoulder width apart, standing on band, holding in both hands until band is tight.  Do a squat. Keep knees in line with second toe. Knees do not go past toes. Repeat _10__ times per set. Do _1__ sets per session.  [email or web address]   Copyright  VHI. All rights reserved.

## 2016-11-02 NOTE — Therapy (Signed)
[facility] [address] [place], [place], [address] Phone: [phone]   Fax:  [phone]  Physical Therapy Treatment  Patient Details  Name: Emily Sweeney MRN: [id number] Date of Birth: [birth year] Referring Provider: Billey Gosling, MD  Encounter Date: 11/02/2016      PT End of Session - 11/02/16 2145    Visit Number 12  New episode of care   Number of Visits 17  see 2/16 update POC (1x/wk x 4 wks)   Date for PT Re-Evaluation 11/03/16  updated due to pt missed one week of PT due to illness (2/26-3/2)   Authorization Type Medicare Traditional/[employer]-GCODE due every 10th visit   PT Start Time 0805   PT Stop Time 0855   PT Time Calculation (min) 50 min   Equipment Utilized During Treatment Gait belt   Activity Tolerance Patient tolerated treatment well  Overall, tolerated well; some activities modified due to LBP   Behavior During Therapy [facility] for tasks assessed/performed      Past Medical History:  Diagnosis Date  . Anemia   . Blood transfusion without reported diagnosis   . Diverticulosis of colon   . GERD (gastroesophageal reflux disease)   . Hearing loss    right ear, and tinnitus  . Hyperlipidemia   . Hypertension   . Tubular adenoma 03/05/2015   Polyp, and Benign lymphoid polyp.    Past Surgical History:  Procedure Laterality Date  . APPENDECTOMY    . BREAST BIOPSY     benign  . BREAST LUMPECTOMY    . COCHLEAR IMPLANT    . CRANIECTOMY FOR EXCISION OF ACOUSTIC NEUROMA  2009  . NASAL SEPTUM SURGERY    . TONSILLECTOMY      There were no vitals filed for this visit.      Subjective Assessment - 11/02/16 0805    Subjective No falls and no near falls. Feels she is back in her routine for doing HEP after prolonged respiratory illness.   Pertinent History lumbar spondylosis, osteopenia, acoustic neuroma removed on R side 2010 (pt corrected was not 2009 as previously reported)   Limitations  Standing;Walking;House hold activities   Patient Stated Goals Pt would like to be able to improve balance.                Vestibular Assessment - 11/02/16 0001      Balancemaster   Therapist, occupational Comment Pt's composite score: 45/100 (norm 46-79 yo ~62)  Somatosensory ~90/100 (norm 80/100), Vision ~76/100 (norm 74/100), Vestibular ~1/100 (norm 50/100). Appropriate preferential use of sensory system: ~95/100 (norm ~70); Patient with h/o unilateral acoustic neuroma removal with vestibular nerve severed.                 [facility] Adult PT Treatment/Exercise - 11/02/16 0810      Ambulation/Gait   Ambulation/Gait Assistance 7: Independent   Ambulation Distance (Feet) 566 Feet   Gait velocity 3.14     Knee/Hip Exercises: Seated   Long Arc Quad Strengthening;Right;1 set;10 reps  blue band   Other Seated Knee/Hip Exercises Leg press with black theraband x 10 reps RLE                PT Education - 11/02/16 2144    Education provided Yes   Education Details Added RLE strengthening with theraband as pt has poorly tolerated step ups due to back pain (chronic, intermittent). Results of SOT   Person(s) Educated Patient   Methods  Explanation;Demonstration;Verbal cues;Handout   Comprehension Verbalized understanding;Returned demonstration          PT Short Term Goals - 09/07/16 6811      PT SHORT TERM GOAL #1   Title Pt will be independent with HEP to address balance and dynamic gait.  TARGET 08/25/16   Time 2   Period Weeks   Status Achieved     PT SHORT TERM GOAL #2   Title Pt will improve TUG score to less than or equal to 15 seconds for decreased fall risk.   Time 2   Period Weeks   Status Achieved     PT SHORT TERM GOAL #3   Title Pt will verbalize understanding of fall prevention in the home environment.   Time 2   Period Weeks   Status Achieved           PT Long Term Goals - 11/02/16 2149      PT LONG  TERM GOAL #1   Title Pt will improve Berg score to at least 42/56 for decreased fall risk.  TARGET 09/22/16  MET 09/29/16 with score 52/56   Time 6   Period Weeks   Status Achieved     PT LONG TERM GOAL #2   Title Pt will improve Dynamic Gait Index score to at least 15/24 for decreased fall risk.  MET 09/29/16   21/24   Time 6   Period Weeks   Status Achieved     PT LONG TERM GOAL #3   Title Pt will imrpove gait velocity score to at least 2.62 ft/sec for improved gait efficiency and safety.     Baseline MET 09/29/16  2.96 ft/sec   Time 6   Period Weeks   Status Achieved     PT LONG TERM GOAL #4   Title Sensory Organization test to be performed, with goal to be written as appropriate.   Time 6   Period Weeks   Status Achieved     PT LONG TERM GOAL #5   Title Pt will improve ABC scale score on FOTO by at least 20% for improved balance confidence.  new target 10/27/16   Baseline ABC to be performed at d/c   Time 6   Period Weeks   Status Achieved     PT LONG TERM GOAL #6   Title Patient will improve composite SOT score by 10% and vision score to >/= 25/100 to demonstrate improved balance and safety. MET 09/29/16    Baseline SOT composite 22; vision 20;  09/29/16  composite 38, vision ~76   Time 5   Period Weeks   Status Achieved     PT LONG TERM GOAL #7   Title Improve SOT composite score to >= 42, with vision score >= 80 TARGET 10/27/16  3/22 composite score 45, vision ~76   Time 4   Period Weeks   Status Partially Met     PT LONG TERM GOAL #8   Title Able to walk >500 ft in 3 minutes on level surface that includes avoiding obstacles and head turns (simulating community ambulation) without LOB. target 10/27/16   11/02/16 566 ft in 3 minutes, no LOB   Time 4   Period Weeks   Status Achieved               Plan - 11/02/16 2147    Clinical Impression Statement Patient discharge visit with LE strengthening and repeated measures for assessing progress. Patient has done very  well  improving her balance and reducing her fall risk (see SOT scores eval to discharge) despite her h/o acoustic neuroma. Overall she met 7 of 8 LTGs and partially met 1 of 8.    Rehab Potential Good   Clinical Impairments Affecting Rehab Potential History of R acoustic neuroma; surgically removed with "nerve severed" 10-09-08 (no vestibular PT following)   PT Frequency --   PT Duration --   PT Treatment/Interventions --   PT Next Visit Plan --   PT Home Exercise Plan see notes   Consulted and Agree with Plan of Care Patient      Patient will benefit from skilled therapeutic intervention in order to improve the following deficits and impairments:  Abnormal gait, Decreased balance, Decreased strength, Difficulty walking, Postural dysfunction, Decreased mobility, Decreased knowledge of use of DME, Dizziness, Other (comment) (impaired vestibular system)  Visit Diagnosis: Poor balance  Unsteadiness on feet  Other abnormalities of gait and mobility  Muscle weakness (generalized)       G-Codes - 11-17-2016 10-Oct-2151    Functional Assessment Tool Used (Outpatient Only) Gait velocity 08-11-16 2.24 ft/sec; November 17, 2016 3.14 ft/sec; SOT 08-24-16 composite score 22 (norm 82-79 yo ~62) 17-Nov-2016 composite 45 (norm ~62)   Mobility: Walking and Moving Around Goal Status [id number]) At least 20 percent but less than 40 percent impaired, limited or restricted   Mobility: Walking and Moving Around Discharge Status [id number]) At least 20 percent but less than 40 percent impaired, limited or restricted      Problem List Patient Active Problem List   Diagnosis Date Noted  . Prediabetes 07/20/2016  . Iron deficiency anemia 07/01/2016  . Chronic lumbar radiculopathy 06/17/2016  . Poor balance 05/24/2016  . At high risk for falls 05/24/2016  . Lumbosacral spondylosis without myelopathy 01/19/2016  . S/P excision of acoustic neuroma 01/19/2016  . Pruritus 01/19/2016  . Abdominal gas pain 01/19/2016  . B12 deficiency  10/17/2015  . Pernicious anemia 06/09/2015  . PVC (premature ventricular contraction) 02/25/2015  . Osteopenia, senile 01/12/2015  . Vitamin D deficiency 01/12/2015  . Nonspecific abnormal electrocardiogram (ECG) (EKG) 10/16/2014  . DIVERTICULITIS OF COLON 10/11/2009  . HYPERCHOLESTEROLEMIA, PURE 10/21/2008  . Essential hypertension 10/21/2008   PHYSICAL THERAPY DISCHARGE SUMMARY  Visits from Start of Care: 12  Current functional level related to goals / functional outcomes: 7 of 8 goals met; improved balance   Remaining deficits: Vestibular deficit due to h/o acoustic neuroma   Education / Equipment: HEP, theraband  Plan: Patient agrees to discharge.  Patient goals were partially met. Patient is being discharged due to being pleased with the current functional level.  ?????        Rexanne Mano, PT 11/17/16, 9:59 PM  [facility] [address] [place], [place], [address] Phone: [phone]   Fax:  [phone]  Name: Emily Sweeney MRN: [id number] Date of Birth: [birth year]

## 2016-11-13 DIAGNOSIS — M9983 Other biomechanical lesions of lumbar region: Secondary | ICD-10-CM | POA: Diagnosis not present

## 2016-11-13 DIAGNOSIS — M5416 Radiculopathy, lumbar region: Secondary | ICD-10-CM | POA: Diagnosis not present

## 2016-11-13 DIAGNOSIS — M4316 Spondylolisthesis, lumbar region: Secondary | ICD-10-CM | POA: Diagnosis not present

## 2016-11-13 DIAGNOSIS — M545 Low back pain: Secondary | ICD-10-CM | POA: Diagnosis not present

## 2016-12-06 NOTE — Progress Notes (Addendum)
Subjective:   Emily Sweeney is a 76 y.o. female who presents for Medicare Annual (Subsequent) preventive examination.  Review of Systems:  No ROS.  Medicare Wellness Visit.  Cardiac Risk Factors include: advanced age (>52men, >67 women);hypertension;family history of premature cardiovascular disease;dyslipidemia Sleep patterns: no sleep issues, feels rested on waking, gets up 1 times nightly to void and sleeps 6 hours nightly.   Home Safety/Smoke Alarms: Feels safe in home. Smoke alarms in place.     Living environment; residence and Firearm Safety: grab bars for shower, no firearms. Lives alone, has supportive neighbors, No needs for DME at this time  Seat Belt Safety/Bike Helmet: Wears seat belt.   Counseling:   Eye Exam- appointment yearly,  Dr. [facility]- appointment every year  Female:   Pap- N/A      Mammo-  None, patient declines referral at this time     Dexa scan- Last 03/17/15 T-score lumbar spine +1.7, RFN -1.4, LFN  -0.9       CCS- Last 03/08/15, precancerous  polyps, recall 5 years     Objective:     Vitals: BP (!) 142/80   Pulse 64   Resp 20   Ht 5\' 6"  (1.676 m)   Wt 152 lb (68.9 kg)   SpO2 98%   BMI 24.53 kg/m   Body mass index is 24.53 kg/m.   Tobacco History  Smoking Status  . Former Smoker  . Quit date: 07/04/1991  Smokeless Tobacco  . Never Used     Counseling given: Not Answered   Past Medical History:  Diagnosis Date  . Anemia   . Blood transfusion without reported diagnosis   . Diverticulosis of colon   . GERD (gastroesophageal reflux disease)   . Hearing loss    right ear, and tinnitus  . Hyperlipidemia   . Hypertension   . Tubular adenoma 03/05/2015   Polyp, and Benign lymphoid polyp.   Past Surgical History:  Procedure Laterality Date  . APPENDECTOMY    . BREAST BIOPSY     benign  . BREAST LUMPECTOMY    . COCHLEAR IMPLANT    . CRANIECTOMY FOR EXCISION OF ACOUSTIC NEUROMA  2009  . NASAL SEPTUM SURGERY    .  TONSILLECTOMY     Family History  Problem Relation Age of Onset  . Sudden death Father 7    MI  . Coronary artery disease Father   . Heart disease Father   . Dementia Mother 15  . Alcohol abuse Brother   . Colon cancer Neg Hx    History  Sexual Activity  . Sexual activity: Not Currently    Outpatient Encounter Prescriptions as of 12/07/2016  Medication Sig  . aspirin EC 81 MG tablet Take 1 tablet (81 mg total) by mouth daily.  Marland Kitchen atorvastatin (LIPITOR) 20 MG tablet TAKE 1 TABLET ONCE DAILY.  . BYSTOLIC 5 MG tablet TAKE 1 TABLET ONCE DAILY.  . calcium carbonate (TUMS - DOSED IN MG ELEMENTAL CALCIUM) 500 MG chewable tablet Chew 1 tablet by mouth daily.  . Ferrous Sulfate (IRON SLOW RELEASE PO) Take 1 tablet by mouth every other day.   . fluticasone (FLONASE) 50 MCG/ACT nasal spray Place 1 spray into both nostrils as needed for allergies or rhinitis.  . naproxen sodium (ANAPROX) 220 MG tablet Take 220 mg by mouth as needed.   . polyvinyl alcohol (LIQUIFILM TEARS) 1.4 % ophthalmic solution Place 1 drop into both eyes daily as needed for dry eyes.  Marland Kitchen  ramipril (ALTACE) 10 MG capsule TAKE (1) CAPSULE DAILY.  Marland Kitchen VITAMIN D, CHOLECALCIFEROL, PO Take 5,000 Units by mouth daily.   . [DISCONTINUED] cyanocobalamin (,VITAMIN B-12,) 1000 MCG/ML injection Inject 1 mL (1,000 mcg total) into the muscle once.  . [DISCONTINUED] benzonatate (TESSALON) 100 MG capsule Take 1 capsule (100 mg total) by mouth 2 (two) times daily as needed for cough. (Patient not taking: Reported on 12/07/2016)   No facility-administered encounter medications on file as of 12/07/2016.     Activities of Daily Living In your present state of health, do you have any difficulty performing the following activities: 12/07/2016  Hearing? Y  Vision? N  Difficulty concentrating or making decisions? N  Walking or climbing stairs? Y  Dressing or bathing? N  Doing errands, shopping? N  Preparing Food and eating ? N  Using the Toilet?  N  In the past six months, have you accidently leaked urine? N  Do you have problems with loss of bowel control? N  Managing your Medications? N  Managing your Finances? N  Housekeeping or managing your Housekeeping? N  Some recent data might be hidden    Patient Care Team: Binnie Rail, MD as PCP - General (Internal Medicine) Katy Apo, MD (Ophthalmology)    Assessment:    Physical assessment deferred to PCP.  Exercise Activities and Dietary recommendations Current Exercise Habits: Structured exercise class, Type of exercise: walking, Time (Minutes): 40, Frequency (Times/Week): 5, Weekly Exercise (Minutes/Week): 200, Intensity: Mild, Exercise limited by: Other - see comments (PT for balance and mobility)  Diet (meal preparation, eat out, water intake, caffeinated beverages, dairy products, fruits and vegetables): in general, a "healthy" diet  , well balanced, low fat/ cholesterol, low salt eats a variety of fruits and vegetables daily, limits sugar/ carbohydrates, drinks 6-8 glasses of water daily     Goals    . lose 5 pounds          Continue to walk, exercise, eat healthy.      Fall Risk Fall Risk  12/07/2016 06/27/2016 06/09/2016 01/19/2016 10/17/2015  Falls in the past year? Yes No No No No  Number falls in past yr: 1 - - - -  Injury with Fall? No - - - -  Risk for fall due to : Impaired balance/gait;Impaired mobility - - - -  Follow up Falls prevention discussed;Education provided - - - -   Depression Screen PHQ 2/9 Scores 12/07/2016 01/19/2016 10/17/2015 10/14/2015  PHQ - 2 Score 0 0 0 0  PHQ- 9 Score 0 - - -     Cognitive Function       Ad8 score reviewed for issues:  Issues making decisions: no  Less interest in hobbies / activities: no  Repeats questions, stories (family complaining): no  Trouble using ordinary gadgets (microwave, computer, phone): no  Forgets the month or year: no  Mismanaging finances: no  Remembering appts: no  Daily problems with  thinking and/or memory: no Ad8 score is= o  Immunization History  Administered Date(s) Administered  . Influenza Split 07/04/2011, 09/15/2011  . Influenza Whole 05/14/2008, 06/10/2010  . Influenza, High Dose Seasonal PF 06/18/2013, 05/02/2016  . Influenza,inj,Quad PF,36+ Mos 09/23/2014, 06/09/2015  . Pneumococcal Conjugate-13 10/16/2014  . Pneumococcal Polysaccharide-23 10/21/2008, 10/14/2015  . Td 11/13/2006  . Zoster 12/28/2006   Screening Tests Health Maintenance  Topic Date Due  . TETANUS/TDAP  11/12/2016  . INFLUENZA VACCINE  03/14/2017  . COLONOSCOPY  03/07/2020  . DEXA SCAN  Completed  .  PNA vac Low Risk Adult  Completed      Plan:     Continue to eat heart healthy diet (full of fruits, vegetables, whole grains, lean protein, water--limit salt, fat, and sugar intake) and increase physical activity as tolerated.  Continue doing brain stimulating activities (puzzles, reading, adult coloring books, staying active) to keep memory sharp.   During the course of the visit the patient was educated and counseled about the following appropriate screening and preventive services:   Vaccines to include Pneumoccal, Influenza, Hepatitis B, Td, Zostavax, HCV  Cardiovascular Disease  Colorectal cancer screening  Bone density screening  Diabetes screening  Glaucoma screening  Mammography/PAP  Nutrition counseling   Patient Instructions (the written plan) was given to the patient.   Michiel Cowboy, RN  12/07/2016   Medical screening examination/treatment/procedure(s) were performed by non-physician practitioner and as supervising physician I was immediately available for consultation/collaboration. I agree with above. Binnie Rail, MD

## 2016-12-06 NOTE — Progress Notes (Signed)
Pre visit review using our clinic review tool, if applicable. No additional management support is needed unless otherwise documented below in the visit note. 

## 2016-12-07 ENCOUNTER — Telehealth: Payer: Self-pay | Admitting: *Deleted

## 2016-12-07 ENCOUNTER — Ambulatory Visit (INDEPENDENT_AMBULATORY_CARE_PROVIDER_SITE_OTHER): Payer: Medicare Other | Admitting: *Deleted

## 2016-12-07 VITALS — BP 142/80 | HR 64 | Resp 20 | Ht 66.0 in | Wt 152.0 lb

## 2016-12-07 DIAGNOSIS — Z0000 Encounter for general adult medical examination without abnormal findings: Secondary | ICD-10-CM | POA: Diagnosis not present

## 2016-12-07 DIAGNOSIS — M21619 Bunion of unspecified foot: Secondary | ICD-10-CM

## 2016-12-07 NOTE — Patient Instructions (Addendum)
Continue to eat heart healthy diet (full of fruits, vegetables, whole grains, lean protein, water--limit salt, fat, and sugar intake) and increase physical activity as tolerated.  Continue doing brain stimulating activities (puzzles, reading, adult coloring books, staying active) to keep memory sharp.   Emily Sweeney , Thank you for taking time to come for your Medicare Wellness Visit. I appreciate your ongoing commitment to your health goals. Please review the following plan we discussed and let me know if I can assist you in the future.   These are the goals we discussed: Goals    . lose 5 pounds          Continue to walk, exercise, eat healthy.       This is a list of the screening recommended for you and due dates:  Health Maintenance  Topic Date Due  . Tetanus Vaccine  11/12/2016  . Flu Shot  03/14/2017  . Colon Cancer Screening  03/07/2020  . DEXA scan (bone density measurement)  Completed  . Pneumonia vaccines  Completed

## 2016-12-07 NOTE — Telephone Encounter (Signed)
ordered

## 2016-12-07 NOTE — Telephone Encounter (Signed)
During AWV patient requested to have a referral to podiatry due to bilateral bunions and new thickening on the bottom of her feet.

## 2016-12-11 ENCOUNTER — Telehealth: Payer: Self-pay | Admitting: *Deleted

## 2016-12-11 NOTE — Telephone Encounter (Signed)
Called and left a VM to inform patient that a podiatry referral has been ordered for the patient. Left nurse's call-back number and requested that she call back if there was any further questions or concerns.

## 2016-12-22 DIAGNOSIS — J300 Vasomotor rhinitis: Secondary | ICD-10-CM | POA: Diagnosis not present

## 2016-12-22 DIAGNOSIS — H903 Sensorineural hearing loss, bilateral: Secondary | ICD-10-CM | POA: Diagnosis not present

## 2016-12-27 ENCOUNTER — Ambulatory Visit (INDEPENDENT_AMBULATORY_CARE_PROVIDER_SITE_OTHER): Payer: Medicare Other

## 2016-12-27 ENCOUNTER — Encounter: Payer: Self-pay | Admitting: Podiatry

## 2016-12-27 ENCOUNTER — Ambulatory Visit (INDEPENDENT_AMBULATORY_CARE_PROVIDER_SITE_OTHER): Payer: Medicare Other | Admitting: Podiatry

## 2016-12-27 VITALS — BP 163/91 | HR 65 | Resp 16 | Ht 66.0 in | Wt 145.0 lb

## 2016-12-27 DIAGNOSIS — M2010 Hallux valgus (acquired), unspecified foot: Secondary | ICD-10-CM

## 2016-12-27 DIAGNOSIS — G629 Polyneuropathy, unspecified: Secondary | ICD-10-CM | POA: Diagnosis not present

## 2016-12-27 DIAGNOSIS — L84 Corns and callosities: Secondary | ICD-10-CM

## 2016-12-27 NOTE — Progress Notes (Signed)
   Subjective:    Patient ID: Emily Sweeney, female    DOB: 08-17-1940, 76 y.o.   MRN: [id number]  HPI Chief Complaint  Patient presents with  . Painful lesion    Left foot; plantar forefoot; x5-6 months  . Foot Pain    Right foot; dorsal; pt stated, "not here for their bunions"     Review of Systems  All other systems reviewed and are negative.      Objective:   Physical Exam        Assessment & Plan:

## 2016-12-27 NOTE — Progress Notes (Signed)
Subjective:    Patient ID: Emily Sweeney, female   DOB: 76 y.o.   MRN: [id number]   HPI patient presents stating that she's had a lot of pain underneath her left foot for the last for 5 months she has severe bunion deformity and she has nerve damage from having previous back issues. She states that she cannot feel her feet well and that she has had several significant falls and has had physical therapy which so far has not been beneficial    Review of Systems  All other systems reviewed and are negative.       Objective:  Physical Exam  Constitutional: She is oriented to person, place, and time.  Cardiovascular: Intact distal pulses.   Musculoskeletal: Normal range of motion.  Neurological: She is alert and oriented to person, place, and time.  Skin: Skin is warm and dry.  Nursing note and vitals reviewed.  vascular status was found to be intact with diminished sharp Dole vibratory noted. Patient was noted to have significant structural malalignment of both feet with significant collapse medial longitudinal arch large structural bunion deformity and plantar keratotic lesion left she is unsteady in gait and does have issues related to her foot structure and also her neuropathic condition     Assessment:    At risk patient of falls with significant lesion formation left and severe structural malalignment bilateral     Plan:     H&P and all conditions reviewed. At this point I have recommended debridement of lesion which was accomplished and long-term balance bracing to try to help provide better stability of her structure and hopefully prevent her from falling. She will be evaluated by PET or this for this but I do think given her history of falls neuropathy and severe structural deficit that she makes an excellent balance brace candidate  X-rays indicate severe structural malalignment of the arch bilateral with large bunion deformity bilateral

## 2017-01-03 ENCOUNTER — Ambulatory Visit: Payer: Medicare Other | Admitting: Orthotics

## 2017-01-03 DIAGNOSIS — M2010 Hallux valgus (acquired), unspecified foot: Secondary | ICD-10-CM

## 2017-01-03 NOTE — Progress Notes (Signed)
Patient presents today for casting for bilat [facility] Brace...series of balance/endurance tests were done: Timed Up and Go, 4-stage balance test, and 30 second chair stand test..Marland KitchenPatient did best with TUG test as she was able to walk 10 feet with minimal shuffling of feet; however she struggled with getting up from chair with arms crossed w/o loosing balance.  Furthermore, she was not able to complete 3 out of 4 stages of the Balance test.  Evaluation is that she def candidate for balance brace... She was casted and charges will be dropped upon dispensing.Marland KitchenMarland Kitchen

## 2017-01-10 DIAGNOSIS — J310 Chronic rhinitis: Secondary | ICD-10-CM | POA: Diagnosis not present

## 2017-01-10 DIAGNOSIS — H903 Sensorineural hearing loss, bilateral: Secondary | ICD-10-CM | POA: Diagnosis not present

## 2017-01-10 DIAGNOSIS — H6993 Unspecified Eustachian tube disorder, bilateral: Secondary | ICD-10-CM | POA: Diagnosis not present

## 2017-01-13 NOTE — Patient Instructions (Addendum)
  Test(s) ordered today. Your results will be released to [facility] (or called to you) after review, usually within 72hours after test completion. If any changes need to be made, you will be notified at that same time.  All other Health Maintenance issues reviewed.   All recommended immunizations and age-appropriate screenings are up-to-date or discussed.  No immunizations administered today.  Consider tetanus and shingles vaccine.   Medications reviewed and updated.  No changes recommended at this time.   Please followup in 6 months

## 2017-01-13 NOTE — Progress Notes (Signed)
Subjective:    Patient ID: Emily Sweeney, female    DOB: 04-09-1941, 76 y.o.   MRN: [id number]  HPI The patient is here for follow up.  Hypertension: She is taking her medication daily. She is compliant with a low sodium diet.  She denies chest pain, palpitations, edema, shortness of breath and regular headaches. She is exercising regularly.  She does monitor her blood pressure at home - 130/70 consistently.    Prediabetes:  She is compliant with a low sugar/carbohydrate diet.  She is exercising regularly.  Hyperlipidemia: She is taking her medication daily. She is compliant with a low fat/cholesterol diet. She is exercising regularly. She denies myalgias.   Iron def anemia:  She is taking the iron pills every other day and sometimes a little less often than that.  She denies any blood in her stool.     Medications and allergies reviewed with patient and updated if appropriate.  Patient Active Problem List   Diagnosis Date Noted  . Prediabetes 07/20/2016  . Iron deficiency anemia 07/01/2016  . Chronic lumbar radiculopathy 06/17/2016  . Poor balance 05/24/2016  . At high risk for falls 05/24/2016  . Lumbosacral spondylosis without myelopathy 01/19/2016  . S/P excision of acoustic neuroma 01/19/2016  . Pruritus 01/19/2016  . Abdominal gas pain 01/19/2016  . B12 deficiency 10/17/2015  . Pernicious anemia 06/09/2015  . PVC (premature ventricular contraction) 02/25/2015  . Osteopenia, senile 01/12/2015  . Vitamin D deficiency 01/12/2015  . Nonspecific abnormal electrocardiogram (ECG) (EKG) 10/16/2014  . DIVERTICULITIS OF COLON 10/11/2009  . HYPERCHOLESTEROLEMIA, PURE 10/21/2008  . Essential hypertension 10/21/2008    Current Outpatient Prescriptions on File Prior to Visit  Medication Sig Dispense Refill  . aspirin EC 81 MG tablet Take 1 tablet (81 mg total) by mouth daily. 90 tablet 1  . atorvastatin (LIPITOR) 20 MG tablet TAKE 1 TABLET ONCE DAILY. 90 tablet 3  . BYSTOLIC  5 MG tablet TAKE 1 TABLET ONCE DAILY. 30 tablet 11  . calcium carbonate (TUMS - DOSED IN MG ELEMENTAL CALCIUM) 500 MG chewable tablet Chew 1 tablet by mouth daily.    . Ferrous Sulfate (IRON SLOW RELEASE PO) Take 1 tablet by mouth every other day.     . fluticasone (FLONASE) 50 MCG/ACT nasal spray Place 1 spray into both nostrils as needed for allergies or rhinitis.    . naproxen sodium (ANAPROX) 220 MG tablet Take 220 mg by mouth as needed.     . polyvinyl alcohol (LIQUIFILM TEARS) 1.4 % ophthalmic solution Place 1 drop into both eyes daily as needed for dry eyes.    . ramipril (ALTACE) 10 MG capsule TAKE (1) CAPSULE DAILY. 90 capsule 3  . VITAMIN D, CHOLECALCIFEROL, PO Take 5,000 Units by mouth daily.      No current facility-administered medications on file prior to visit.     Past Medical History:  Diagnosis Date  . Anemia   . Blood transfusion without reported diagnosis   . Diverticulosis of colon   . GERD (gastroesophageal reflux disease)   . Hearing loss    right ear, and tinnitus  . Hyperlipidemia   . Hypertension   . Tubular adenoma 03/05/2015   Polyp, and Benign lymphoid polyp.    Past Surgical History:  Procedure Laterality Date  . APPENDECTOMY    . BREAST BIOPSY     benign  . BREAST LUMPECTOMY    . COCHLEAR IMPLANT    . CRANIECTOMY FOR EXCISION OF ACOUSTIC NEUROMA  2009  . NASAL SEPTUM SURGERY    . TONSILLECTOMY      Social History   Social History  . Marital status: Divorced    Spouse name: N/A  . Number of children: N/A  . Years of education: N/A   Occupational History  . psychologist    Social History Main Topics  . Smoking status: Former Smoker    Quit date: 07/04/1991  . Smokeless tobacco: Never Used  . Alcohol use 0.0 oz/week     Comment: glass wine 4-5 days a week  . Drug use: No  . Sexual activity: Not Currently   Other Topics Concern  . Not on file   Social History Narrative  . No narrative on file    Family History  Problem  Relation Age of Onset  . Sudden death Father 55       MI  . Coronary artery disease Father   . Heart disease Father   . Dementia Mother 9  . Alcohol abuse Brother   . Colon cancer Neg Hx     Review of Systems  Constitutional: Negative for chills, fatigue and fever.  Respiratory: Negative for cough, shortness of breath and wheezing.   Cardiovascular: Negative for chest pain, palpitations and leg swelling.  Gastrointestinal: Negative for abdominal pain and blood in stool.  Neurological: Negative for dizziness, light-headedness and headaches.       Objective:   Vitals:   01/15/17 0856  BP: (!) 152/84  Pulse: 73  Resp: 16  Temp: 97.7 F (36.5 C)   Wt Readings from Last 3 Encounters:  01/15/17 151 lb (68.5 kg)  12/27/16 145 lb (65.8 kg)  12/07/16 152 lb (68.9 kg)   Body mass index is 24.37 kg/m.   Physical Exam    Constitutional: Appears well-developed and well-nourished. No distress.  HENT:  Head: Normocephalic and atraumatic.  Neck: Neck supple. No tracheal deviation present. No thyromegaly present.  No cervical lymphadenopathy Cardiovascular: Normal rate, regular rhythm and normal heart sounds.   No murmur heard. No carotid bruit .  No edema Pulmonary/Chest: Effort normal and breath sounds normal. No respiratory distress. No has no wheezes. No rales.  Skin: Skin is warm and dry. Not diaphoretic.  Psychiatric: Normal mood and affect. Behavior is normal.      Assessment & Plan:    See Problem List for Assessment and Plan of chronic medical problems.

## 2017-01-15 ENCOUNTER — Encounter: Payer: Self-pay | Admitting: Internal Medicine

## 2017-01-15 ENCOUNTER — Other Ambulatory Visit (INDEPENDENT_AMBULATORY_CARE_PROVIDER_SITE_OTHER): Payer: Medicare Other

## 2017-01-15 ENCOUNTER — Ambulatory Visit (INDEPENDENT_AMBULATORY_CARE_PROVIDER_SITE_OTHER): Payer: Medicare Other | Admitting: Internal Medicine

## 2017-01-15 VITALS — BP 152/84 | HR 73 | Temp 97.7°F | Resp 16 | Ht 66.0 in | Wt 151.0 lb

## 2017-01-15 DIAGNOSIS — E7800 Pure hypercholesterolemia, unspecified: Secondary | ICD-10-CM

## 2017-01-15 DIAGNOSIS — I10 Essential (primary) hypertension: Secondary | ICD-10-CM

## 2017-01-15 DIAGNOSIS — R7303 Prediabetes: Secondary | ICD-10-CM | POA: Diagnosis not present

## 2017-01-15 DIAGNOSIS — D509 Iron deficiency anemia, unspecified: Secondary | ICD-10-CM

## 2017-01-15 LAB — LIPID PANEL
CHOL/HDL RATIO: 2
Cholesterol: 175 mg/dL (ref 0–200)
HDL: 78.1 mg/dL (ref >39.00)
LDL Cholesterol: 79 mg/dL (ref 0–99)
NonHDL: 96.95
TRIGLYCERIDES: 91 mg/dL (ref 0.0–149.0)
VLDL: 18.2 mg/dL (ref 0.0–40.0)

## 2017-01-15 LAB — COMPREHENSIVE METABOLIC PANEL
ALK PHOS: 76 U/L (ref 39–117)
ALT: 11 U/L (ref 0–35)
AST: 19 U/L (ref 0–37)
Albumin: 4.4 g/dL (ref 3.5–5.2)
BILIRUBIN TOTAL: 0.6 mg/dL (ref 0.2–1.2)
BUN: 15 mg/dL (ref 6–23)
CALCIUM: 10 mg/dL (ref 8.4–10.5)
CO2: 27 meq/L (ref 19–32)
Chloride: 105 mEq/L (ref 96–112)
Creatinine, Ser: 0.89 mg/dL (ref 0.40–1.20)
GFR: 65.23 mL/min (ref >60.00)
Glucose, Bld: 137 mg/dL — ABNORMAL HIGH (ref 70–99)
Potassium: 5.7 mEq/L — ABNORMAL HIGH (ref 3.5–5.1)
Sodium: 138 mEq/L (ref 135–145)
Total Protein: 7.1 g/dL (ref 6.0–8.3)

## 2017-01-15 LAB — CBC WITH DIFFERENTIAL/PLATELET
BASOS ABS: 0.1 10*3/uL (ref 0.0–0.1)
Basophils Relative: 0.8 % (ref 0.0–3.0)
Eosinophils Absolute: 0.1 10*3/uL (ref 0.0–0.7)
Eosinophils Relative: 1.1 % (ref 0.0–5.0)
HCT: 36.7 % (ref 36.0–46.0)
Hemoglobin: 12 g/dL (ref 12.0–15.0)
LYMPHS ABS: 1.7 10*3/uL (ref 0.7–4.0)
Lymphocytes Relative: 24.7 % (ref 12.0–46.0)
MCHC: 32.7 g/dL (ref 30.0–36.0)
MCV: 88.3 fl (ref 78.0–100.0)
MONO ABS: 0.7 10*3/uL (ref 0.1–1.0)
MONOS PCT: 9.9 % (ref 3.0–12.0)
NEUTROS ABS: 4.3 10*3/uL (ref 1.4–7.7)
NEUTROS PCT: 63.5 % (ref 43.0–77.0)
PLATELETS: 228 10*3/uL (ref 150.0–400.0)
RBC: 4.16 Mil/uL (ref 3.87–5.11)
RDW: 14.4 % (ref 11.5–15.5)
WBC: 6.7 10*3/uL (ref 4.0–10.5)

## 2017-01-15 LAB — FERRITIN: Ferritin: 8.5 ng/mL — ABNORMAL LOW (ref 10.0–291.0)

## 2017-01-15 LAB — IRON: Iron: 37 ug/dL — ABNORMAL LOW (ref 42–145)

## 2017-01-15 LAB — HEMOGLOBIN A1C: Hgb A1c MFr Bld: 6.2 % (ref 4.6–6.5)

## 2017-01-15 NOTE — Assessment & Plan Note (Addendum)
Elevated here but always well controlled at home Current regimen effective and well tolerated Continue current medications at current doses cmp

## 2017-01-15 NOTE — Assessment & Plan Note (Signed)
Check a1c Low sugar / carb diet Stressed regular exercise, keeping weight down  

## 2017-01-15 NOTE — Assessment & Plan Note (Signed)
Check cbc, iron/ferritin 

## 2017-01-15 NOTE — Assessment & Plan Note (Signed)
Check lipid panel  Continue daily statin Regular exercise and healthy diet encouraged  

## 2017-01-24 ENCOUNTER — Other Ambulatory Visit: Payer: Self-pay | Admitting: Emergency Medicine

## 2017-01-24 ENCOUNTER — Encounter: Payer: Self-pay | Admitting: Emergency Medicine

## 2017-01-24 ENCOUNTER — Other Ambulatory Visit (INDEPENDENT_AMBULATORY_CARE_PROVIDER_SITE_OTHER): Payer: Medicare Other

## 2017-01-24 DIAGNOSIS — E875 Hyperkalemia: Secondary | ICD-10-CM | POA: Diagnosis not present

## 2017-01-24 LAB — BASIC METABOLIC PANEL
BUN: 12 mg/dL (ref 6–23)
CO2: 27 mEq/L (ref 19–32)
CREATININE: 0.77 mg/dL (ref 0.40–1.20)
Calcium: 9.8 mg/dL (ref 8.4–10.5)
Chloride: 104 mEq/L (ref 96–112)
GFR: 77.1 mL/min (ref >60.00)
GLUCOSE: 103 mg/dL — AB (ref 70–99)
Potassium: 4.7 mEq/L (ref 3.5–5.1)
SODIUM: 138 meq/L (ref 135–145)

## 2017-01-29 ENCOUNTER — Other Ambulatory Visit: Payer: Self-pay | Admitting: Internal Medicine

## 2017-01-31 ENCOUNTER — Other Ambulatory Visit: Payer: Self-pay | Admitting: Emergency Medicine

## 2017-01-31 MED ORDER — ATORVASTATIN CALCIUM 20 MG PO TABS: 20.0000 mg | ORAL_TABLET | Freq: Every day | ORAL | 3 refills | Status: DC

## 2017-02-01 ENCOUNTER — Ambulatory Visit (INDEPENDENT_AMBULATORY_CARE_PROVIDER_SITE_OTHER): Payer: Medicare Other | Admitting: Orthotics

## 2017-02-01 DIAGNOSIS — M2010 Hallux valgus (acquired), unspecified foot: Secondary | ICD-10-CM

## 2017-02-01 DIAGNOSIS — R269 Unspecified abnormalities of gait and mobility: Secondary | ICD-10-CM | POA: Diagnosis not present

## 2017-02-01 DIAGNOSIS — Z9181 History of falling: Secondary | ICD-10-CM

## 2017-02-01 DIAGNOSIS — R2681 Unsteadiness on feet: Secondary | ICD-10-CM

## 2017-02-01 NOTE — Addendum Note (Signed)
Addended by: Velora Heckler on: 02/01/2017 11:55 AM   Modules accepted: Level of Service

## 2017-02-01 NOTE — Progress Notes (Signed)
Patient came in today to pick up [place] (Bilateral).   Patient was able to don brace independently and brace was check for fit to custom.   Patient was observed walking with brace and gait was improved as well as stability.   Patient was advised of care and wearing instructions.  Advised to notify practice if there were any issues; especially skin irritation.   Codes [id number], [id number], L2820 x 2 (LT and RT)

## 2017-02-03 ENCOUNTER — Emergency Department (HOSPITAL_COMMUNITY): Payer: Medicare Other

## 2017-02-03 ENCOUNTER — Encounter (HOSPITAL_COMMUNITY): Payer: Self-pay

## 2017-02-03 ENCOUNTER — Emergency Department (HOSPITAL_COMMUNITY)
Admission: EM | Admit: 2017-02-03 | Discharge: 2017-02-03 | Disposition: A | Discharge status: Home / Self Care | Payer: Medicare Other | Attending: Emergency Medicine | Admitting: Emergency Medicine

## 2017-02-03 DIAGNOSIS — I10 Essential (primary) hypertension: Secondary | ICD-10-CM | POA: Diagnosis not present

## 2017-02-03 DIAGNOSIS — I621 Nontraumatic extradural hemorrhage: Secondary | ICD-10-CM | POA: Diagnosis not present

## 2017-02-03 DIAGNOSIS — Y999 Unspecified external cause status: Secondary | ICD-10-CM | POA: Diagnosis not present

## 2017-02-03 DIAGNOSIS — Z87891 Personal history of nicotine dependence: Secondary | ICD-10-CM | POA: Diagnosis not present

## 2017-02-03 DIAGNOSIS — M542 Cervicalgia: Secondary | ICD-10-CM | POA: Diagnosis not present

## 2017-02-03 DIAGNOSIS — W010XXA Fall on same level from slipping, tripping and stumbling without subsequent striking against object, initial encounter: Secondary | ICD-10-CM | POA: Diagnosis not present

## 2017-02-03 DIAGNOSIS — Z79899 Other long term (current) drug therapy: Secondary | ICD-10-CM | POA: Diagnosis not present

## 2017-02-03 DIAGNOSIS — S0083XA Contusion of other part of head, initial encounter: Secondary | ICD-10-CM | POA: Diagnosis not present

## 2017-02-03 DIAGNOSIS — Y9389 Activity, other specified: Secondary | ICD-10-CM | POA: Insufficient documentation

## 2017-02-03 DIAGNOSIS — R51 Headache: Secondary | ICD-10-CM | POA: Diagnosis not present

## 2017-02-03 DIAGNOSIS — S0990XA Unspecified injury of head, initial encounter: Secondary | ICD-10-CM | POA: Diagnosis not present

## 2017-02-03 DIAGNOSIS — W19XXXA Unspecified fall, initial encounter: Secondary | ICD-10-CM

## 2017-02-03 DIAGNOSIS — Z7982 Long term (current) use of aspirin: Secondary | ICD-10-CM | POA: Diagnosis not present

## 2017-02-03 DIAGNOSIS — S199XXA Unspecified injury of neck, initial encounter: Secondary | ICD-10-CM | POA: Diagnosis not present

## 2017-02-03 DIAGNOSIS — Y92096 Garden or yard of other non-institutional residence as the place of occurrence of the external cause: Secondary | ICD-10-CM | POA: Insufficient documentation

## 2017-02-03 NOTE — ED Triage Notes (Signed)
Pt coming from with fall outside her yard and trip over the neighbors dog and her dog. Pt state that she  hit her head in back  were she have  baha implant. Small abration to both knees. Patient denies being on  blood thinners. Pt is rating her pain 7/10 pain to the back of her head at this time.

## 2017-02-03 NOTE — ED Provider Notes (Signed)
[facility] DEPT Provider Note   CSN: [id number] Arrival date & time: 02/03/17  1044     History   Chief Complaint Chief Complaint  Patient presents with  . Fall    HPI Emily Sweeney is a 76 y.o. female.  HPI Patient presents to the emergency room for evaluation of a head injury. Patient was outside in her yard when she tripped over her dog and the neighbor's dog. The patient fell hitting the back of her head. She sustained a large hematoma and is complaining of a 7 out of 10 headache. She denies any neck pain. She has a slight abrasion to her knee but does not have any pain. She denies any other chest pain abdominal pain or extremity pain elsewhere. Past Medical History:  Diagnosis Date  . Anemia   . Blood transfusion without reported diagnosis   . Diverticulosis of colon   . GERD (gastroesophageal reflux disease)   . Hearing loss    right ear, and tinnitus  . Hyperlipidemia   . Hypertension   . Tubular adenoma 03/05/2015   Polyp, and Benign lymphoid polyp.    Patient Active Problem List   Diagnosis Date Noted  . Prediabetes 07/20/2016  . Iron deficiency anemia 07/01/2016  . Chronic lumbar radiculopathy 06/17/2016  . Poor balance 05/24/2016  . At high risk for falls 05/24/2016  . Gait abnormality 04/27/2016  . Lumbosacral spondylosis without myelopathy 01/19/2016  . S/P excision of acoustic neuroma 01/19/2016  . Pruritus 01/19/2016  . Abdominal gas pain 01/19/2016  . B12 deficiency 10/17/2015  . Pernicious anemia 06/09/2015  . PVC (premature ventricular contraction) 02/25/2015  . Osteopenia, senile 01/12/2015  . Vitamin D deficiency 01/12/2015  . Nonspecific abnormal electrocardiogram (ECG) (EKG) 10/16/2014  . DIVERTICULITIS OF COLON 10/11/2009  . HYPERCHOLESTEROLEMIA, PURE 10/21/2008  . Essential hypertension 10/21/2008    Past Surgical History:  Procedure Laterality Date  . APPENDECTOMY    . BREAST BIOPSY     benign  . BREAST LUMPECTOMY    .  COCHLEAR IMPLANT    . CRANIECTOMY FOR EXCISION OF ACOUSTIC NEUROMA  2009  . NASAL SEPTUM SURGERY    . TONSILLECTOMY      OB History    No data available       Home Medications    Prior to Admission medications   Medication Sig Start Date End Date Taking? Authorizing Provider  aspirin EC 81 MG tablet Take 1 tablet (81 mg total) by mouth daily. 10/16/14  Yes Janith Lima, MD  atorvastatin (LIPITOR) 20 MG tablet Take 1 tablet (20 mg total) by mouth daily. 01/31/17  Yes Burns, Claudina Lick, MD  BYSTOLIC 5 MG tablet TAKE 1 TABLET ONCE DAILY. 05/18/16  Yes Janith Lima, MD  Ferrous Sulfate (IRON SLOW RELEASE PO) Take 1 tablet by mouth every other day.    Yes [provider]  fluticasone (FLONASE) 50 MCG/ACT nasal spray Place 1 spray into both nostrils as needed for allergies or rhinitis.   Yes [provider]  polyvinyl alcohol (LIQUIFILM TEARS) 1.4 % ophthalmic solution Place 1 drop into both eyes daily as needed for dry eyes.   Yes [provider]  ramipril (ALTACE) 10 MG capsule TAKE (1) CAPSULE DAILY. 07/26/16  Yes Burns, Claudina Lick, MD  VITAMIN D, CHOLECALCIFEROL, PO Take 5,000 Units by mouth daily.    Yes [provider]    Family History Family History  Problem Relation Age of Onset  . Sudden death Father 16  MI  . Coronary artery disease Father   . Heart disease Father   . Dementia Mother 66  . Alcohol abuse Brother   . Colon cancer Neg Hx     Social History Social History  Substance Use Topics  . Smoking status: Former Smoker    Quit date: 07/04/1991  . Smokeless tobacco: Never Used  . Alcohol use 0.0 oz/week     Comment: glass wine 4-5 days a week     Allergies   Ceftin [cefuroxime axetil]; Ciprofloxacin; Penicillins; and Sulfonamide derivatives   Review of Systems Review of Systems  All other systems reviewed and are negative.    Physical Exam Updated Vital Signs BP (!) 152/76   Pulse 69   Temp 97.5 F (36.4 C)  (Oral)   Resp 20   Ht 1.676 m (5\' 6" )   Wt 65.8 kg (145 lb)   SpO2 99%   BMI 23.40 kg/m   Physical Exam  Constitutional: She appears well-developed and well-nourished. No distress.  HENT:  Head: Normocephalic.  Right Ear: External ear normal.  Left Ear: External ear normal.  Hematoma with tenderness to palpation posterior occiput  Eyes: Conjunctivae are normal. Right eye exhibits no discharge. Left eye exhibits no discharge. No scleral icterus.  Neck: Neck supple. No tracheal deviation present.  Cardiovascular: Normal rate, regular rhythm and intact distal pulses.   Pulmonary/Chest: Effort normal and breath sounds normal. No stridor. No respiratory distress. She has no wheezes. She has no rales.  Abdominal: Soft. Bowel sounds are normal. She exhibits no distension. There is no tenderness. There is no rebound and no guarding.  Musculoskeletal: She exhibits no edema or tenderness.       Right shoulder: She exhibits no tenderness, no bony tenderness and no swelling.       Left shoulder: She exhibits no tenderness, no bony tenderness and no swelling.       Right wrist: She exhibits no tenderness, no bony tenderness and no swelling.       Left wrist: She exhibits no tenderness, no bony tenderness and no swelling.       Right hip: She exhibits normal range of motion, no tenderness, no bony tenderness and no swelling.       Left hip: She exhibits normal range of motion, no tenderness and no bony tenderness.       Right ankle: She exhibits no swelling. No tenderness.       Left ankle: She exhibits no swelling. No tenderness.       Cervical back: She exhibits no tenderness, no bony tenderness and no swelling.       Thoracic back: She exhibits no tenderness, no bony tenderness and no swelling.       Lumbar back: She exhibits no tenderness, no bony tenderness and no swelling.  Neurological: She is alert. She has normal strength. No cranial nerve deficit (no facial droop, extraocular movements  intact, no slurred speech) or sensory deficit. She exhibits normal muscle tone. She displays no seizure activity. Coordination normal.  Skin: Skin is warm and dry. No rash noted.  Psychiatric: She has a normal mood and affect.  Nursing note and vitals reviewed.    ED Treatments / Results  Labs (all labs ordered are listed, but only abnormal results are displayed) Labs Reviewed - No data to display  Radiology Ct Head Wo Contrast  Result Date: 02/03/2017 CLINICAL DATA:  Pt tripped over dogs, pain to head and neck. History cochlear implant rt side. EXAM:  CT HEAD WITHOUT CONTRAST CT CERVICAL SPINE WITHOUT CONTRAST TECHNIQUE: Multidetector CT imaging of the head and cervical spine was performed following the standard protocol without intravenous contrast. Multiplanar CT image reconstructions of the cervical spine were also generated. COMPARISON:  None. FINDINGS: CT HEAD FINDINGS Brain: No acute intracranial hemorrhage. No focal mass lesion. No CT evidence of acute infarction. No midline shift or mass effect. No hydrocephalus. Basilar cisterns are patent. Mild atrophy and periventricular white matter hypodensity Vascular: No hyperdense vessel or unexpected calcification. Skull: Postsurgical change in the RIGHT mastoid related to cochlear implant. No skull fracture Sinuses/Orbits: Paranasal sinuses and mastoid air cells are clear. Orbits are clear. Other: None. CT CERVICAL SPINE FINDINGS Alignment: Normal alignment of the cervical vertebral bodies. Skull base and vertebrae: Normal craniocervical junction. No loss of bowel vertebral body height or disc height. Normal facet articulation. No evidence of fracture. Soft tissues and spinal canal: No prevertebral soft tissue swelling. No perispinal or epidural hematoma. Disc levels: Disc space narrowing from C5-C7 with end plate spurring. Upper chest: Clear Other: None IMPRESSION: 1. No acute intracranial trauma. 2. White matter microvascular disease and mild  atrophy 3. Postsurgical change RIGHT mastoid. 4. No cervical spine fracture. 5. 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   On: 02/03/2017 12:44   Ct Cervical Spine Wo Contrast  Result Date: 02/03/2017 CLINICAL DATA:  Pt tripped over dogs, pain to head and neck. History cochlear implant rt side. EXAM: CT HEAD WITHOUT CONTRAST CT CERVICAL SPINE WITHOUT CONTRAST TECHNIQUE: Multidetector CT imaging of the head and cervical spine was performed following the standard protocol without intravenous contrast. Multiplanar CT image reconstructions of the cervical spine were also generated. COMPARISON:  None. FINDINGS: CT HEAD FINDINGS Brain: No acute intracranial hemorrhage. No focal mass lesion. No CT evidence of acute infarction. No midline shift or mass effect. No hydrocephalus. Basilar cisterns are patent. Mild atrophy and periventricular white matter hypodensity Vascular: No hyperdense vessel or unexpected calcification. Skull: Postsurgical change in the RIGHT mastoid related to cochlear implant. No skull fracture Sinuses/Orbits: Paranasal sinuses and mastoid air cells are clear. Orbits are clear. Other: None. CT CERVICAL SPINE FINDINGS Alignment: Normal alignment of the cervical vertebral bodies. Skull base and vertebrae: Normal craniocervical junction. No loss of bowel vertebral body height or disc height. Normal facet articulation. No evidence of fracture. Soft tissues and spinal canal: No prevertebral soft tissue swelling. No perispinal or epidural hematoma. Disc levels: Disc space narrowing from C5-C7 with end plate spurring. Upper chest: Clear Other: None IMPRESSION: 1. No acute intracranial trauma. 2. White matter microvascular disease and mild atrophy 3. Postsurgical change RIGHT mastoid. 4. No cervical spine fracture. 5. 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   On:  02/03/2017 12:44    Procedures Procedures (including critical care time)  Medications Ordered in ED Medications - No data to display   Initial Impression / Assessment and Plan / ED Course  I have reviewed the triage vital signs and the nursing notes.  Pertinent labs & imaging results that were available during my care of the patient were reviewed by me and considered in my medical decision making (see chart for details).    No evidence of serious injury associated with the fall.  Consistent with soft tissue injury/strain/contusion.  Explained findings to patient and warning signs that should prompt return to the ED.   Final  Clinical Impressions(s) / ED Diagnoses   Final diagnoses:  Fall, initial encounter  Injury of head, initial encounter    New Prescriptions New Prescriptions   No medications on file     Dorie Rank, MD 02/03/17 1309

## 2017-02-03 NOTE — Discharge Instructions (Signed)
Take over-the-counter medications as needed for pain, apply ice to help with any areas of swelling or contusions, return to the emergency room, for vomiting, severe headche

## 2017-02-03 NOTE — ED Notes (Signed)
Bed: WA09 Expected date:  Expected time:  Means of arrival:  Comments: 76 yo fall, hematoma to back of head

## 2017-02-10 ENCOUNTER — Encounter: Payer: Self-pay | Admitting: Internal Medicine

## 2017-03-08 DIAGNOSIS — H2513 Age-related nuclear cataract, bilateral: Secondary | ICD-10-CM | POA: Diagnosis not present

## 2017-03-08 DIAGNOSIS — H524 Presbyopia: Secondary | ICD-10-CM | POA: Diagnosis not present

## 2017-03-08 DIAGNOSIS — H04121 Dry eye syndrome of right lacrimal gland: Secondary | ICD-10-CM | POA: Diagnosis not present

## 2017-05-22 ENCOUNTER — Other Ambulatory Visit: Payer: Self-pay | Admitting: Internal Medicine

## 2017-05-22 DIAGNOSIS — I10 Essential (primary) hypertension: Secondary | ICD-10-CM

## 2017-05-22 DIAGNOSIS — R002 Palpitations: Secondary | ICD-10-CM

## 2017-05-24 ENCOUNTER — Ambulatory Visit: Payer: Medicare Other

## 2017-05-25 ENCOUNTER — Ambulatory Visit (INDEPENDENT_AMBULATORY_CARE_PROVIDER_SITE_OTHER): Payer: Medicare Other

## 2017-05-25 DIAGNOSIS — Z23 Encounter for immunization: Secondary | ICD-10-CM | POA: Diagnosis not present

## 2017-05-25 NOTE — Addendum Note (Signed)
Addended by: Ander Slade on: 05/25/2017 11:07 AM   Modules accepted: Orders

## 2017-06-21 ENCOUNTER — Other Ambulatory Visit: Payer: Self-pay | Admitting: Internal Medicine

## 2017-06-21 DIAGNOSIS — I10 Essential (primary) hypertension: Secondary | ICD-10-CM

## 2017-06-21 DIAGNOSIS — R002 Palpitations: Secondary | ICD-10-CM

## 2017-06-28 ENCOUNTER — Encounter: Payer: Self-pay | Admitting: Internal Medicine

## 2017-06-28 ENCOUNTER — Other Ambulatory Visit: Payer: Medicare Other

## 2017-06-28 ENCOUNTER — Ambulatory Visit (INDEPENDENT_AMBULATORY_CARE_PROVIDER_SITE_OTHER): Payer: Medicare Other | Admitting: Internal Medicine

## 2017-06-28 VITALS — BP 134/80 | HR 75 | Temp 98.3°F | Resp 16 | Wt 153.0 lb

## 2017-06-28 DIAGNOSIS — R300 Dysuria: Secondary | ICD-10-CM

## 2017-06-28 DIAGNOSIS — N390 Urinary tract infection, site not specified: Secondary | ICD-10-CM | POA: Diagnosis not present

## 2017-06-28 DIAGNOSIS — B373 Candidiasis of vulva and vagina: Secondary | ICD-10-CM

## 2017-06-28 DIAGNOSIS — B3731 Acute candidiasis of vulva and vagina: Secondary | ICD-10-CM

## 2017-06-28 LAB — POCT URINALYSIS DIPSTICK
BILIRUBIN UA: NEGATIVE
Glucose, UA: NEGATIVE
Ketones, UA: NEGATIVE
NITRITE UA: NEGATIVE
PH UA: 6 (ref 5.0–8.0)
Protein, UA: NEGATIVE
SPEC GRAV UA: 1.025 (ref 1.010–1.025)
Urobilinogen, UA: 0.2 E.U./dL

## 2017-06-28 MED ORDER — NITROFURANTOIN MONOHYD MACRO 100 MG PO CAPS: 100.0000 mg | ORAL_CAPSULE | Freq: Two times a day (BID) | ORAL | 0 refills | Status: DC

## 2017-06-28 MED ORDER — FLUCONAZOLE 150 MG PO TABS: 150.0000 mg | ORAL_TABLET | Freq: Once | ORAL | 0 refills | Status: AC

## 2017-06-28 NOTE — Assessment & Plan Note (Signed)
Urine dip appears consistent with a UTI Will send for culture Given allergies will start Macrobid twice daily times 7 days Increase water intake Tylenol as needed Call if no improvement

## 2017-06-28 NOTE — Assessment & Plan Note (Signed)
Some of her symptoms are consistent with a vaginal yeast infection She did take over-the-counter Monistat and it did help, but still having symptoms Diflucan now, repeat after antibiotics and I will give her 1/3 pill to repeat after that if needed

## 2017-06-28 NOTE — Patient Instructions (Signed)
Take the antibiotic and antifungal medications as prescribed.  Take tylenol if needed.  Increase your water intake.     Urinary Tract Infection, Adult A urinary tract infection (UTI) is an infection of any part of the urinary tract, which includes the kidneys, ureters, bladder, and urethra. These organs make, store, and get rid of urine in the body. UTI can be a bladder infection (cystitis) or kidney infection (pyelonephritis). What are the causes? This infection may be caused by fungi, viruses, or bacteria. Bacteria are the most common cause of UTIs. This condition can also be caused by repeated incomplete emptying of the bladder during urination. What increases the risk? This condition is more likely to develop if:  You ignore your need to urinate or hold urine for long periods of time.  You do not empty your bladder completely during urination.  You wipe back to front after urinating or having a bowel movement, if you are female.  You are uncircumcised, if you are female.  You are constipated.  You have a urinary catheter that stays in place (indwelling).  You have a weak defense (immune) system.  You have a medical condition that affects your bowels, kidneys, or bladder.  You have diabetes.  You take antibiotic medicines frequently or for long periods of time, and the antibiotics no longer work well against certain types of infections (antibiotic resistance).  You take medicines that irritate your urinary tract.  You are exposed to chemicals that irritate your urinary tract.  You are female.  What are the signs or symptoms? Symptoms of this condition include:  Fever.  Frequent urination or passing small amounts of urine frequently.  Needing to urinate urgently.  Pain or burning with urination.  Urine that smells bad or unusual.  Cloudy urine.  Pain in the lower abdomen or back.  Trouble urinating.  Blood in the urine.  Vomiting or being less hungry than  normal.  Diarrhea or abdominal pain.  Vaginal discharge, if you are female.  How is this diagnosed? This condition is diagnosed with a medical history and physical exam. You will also need to provide a urine sample to test your urine. Other tests may be done, including:  Blood tests.  Sexually transmitted disease (STD) testing.  If you have had more than one UTI, a cystoscopy or imaging studies may be done to determine the cause of the infections. How is this treated? Treatment for this condition often includes a combination of two or more of the following:  Antibiotic medicine.  Other medicines to treat less common causes of UTI.  Over-the-counter medicines to treat pain.  Drinking enough water to stay hydrated.  Follow these instructions at home:  Take over-the-counter and prescription medicines only as told by your health care provider.  If you were prescribed an antibiotic, take it as told by your health care provider. Do not stop taking the antibiotic even if you start to feel better.  Avoid alcohol, caffeine, tea, and carbonated beverages. They can irritate your bladder.  Drink enough fluid to keep your urine clear or pale yellow.  Keep all follow-up visits as told by your health care provider. This is important.  Make sure to: ? Empty your bladder often and completely. Do not hold urine for long periods of time. ? Empty your bladder before and after sex. ? Wipe from front to back after a bowel movement if you are female. Use each tissue one time when you wipe. Contact a health care  provider if:  You have back pain.  You have a fever.  You feel nauseous or vomit.  Your symptoms do not get better after 3 days.  Your symptoms go away and then return. Get help right away if:  You have severe back pain or lower abdominal pain.  You are vomiting and cannot keep down any medicines or water. This information is not intended to replace advice given to you by  your health care provider. Make sure you discuss any questions you have with your health care provider. Document Released: 05/10/2005 Document Revised: 01/12/2016 Document Reviewed: 06/21/2015 Elsevier Interactive Patient Education  2017 [employer].

## 2017-06-28 NOTE — Progress Notes (Signed)
Subjective:    Patient ID: Emily Sweeney, female    DOB: March 29, 1941, 76 y.o.   MRN: [id number]  HPI She is here for an acute visit.   Her symptoms started 1 week ago.  She felt she had a possible yeast infection and took Monistat over-the-counter.  It did help a little, but the symptoms did not go away completely.  She states vaginal itching and mild discharge.  She also thinks she has a urinary tract infection.  She has increased urinary frequency, cloudy urine and dysuria.  She denies any blood in the urine.  She has lower abdominal discomfort.  She did have a transient mild fever.  She denies any nausea or back pain out of the ordinary.  Recently she had an episode of swelling and dry mouth.  She was drinking a lot of orange juice, but denies anything new in her diet.  Symptoms improved with an antihistamine.  She still has minimal residual lip swelling.  She has been tested for allergies in the past and everything was negative.  She has never had this occur.   Medications and allergies reviewed with patient and updated if appropriate.  Patient Active Problem List   Diagnosis Date Noted  . Prediabetes 07/20/2016  . Iron deficiency anemia 07/01/2016  . Chronic lumbar radiculopathy 06/17/2016  . Poor balance 05/24/2016  . At high risk for falls 05/24/2016  . Gait abnormality 04/27/2016  . Lumbosacral spondylosis without myelopathy 01/19/2016  . S/P excision of acoustic neuroma 01/19/2016  . Pruritus 01/19/2016  . Abdominal gas pain 01/19/2016  . B12 deficiency 10/17/2015  . Pernicious anemia 06/09/2015  . PVC (premature ventricular contraction) 02/25/2015  . Osteopenia, senile 01/12/2015  . Vitamin D deficiency 01/12/2015  . Nonspecific abnormal electrocardiogram (ECG) (EKG) 10/16/2014  . DIVERTICULITIS OF COLON 10/11/2009  . HYPERCHOLESTEROLEMIA, PURE 10/21/2008  . Essential hypertension 10/21/2008    Current Outpatient Medications on File Prior to Visit  Medication Sig  Dispense Refill  . aspirin EC 81 MG tablet Take 1 tablet (81 mg total) by mouth daily. 90 tablet 1  . atorvastatin (LIPITOR) 20 MG tablet Take 1 tablet (20 mg total) by mouth daily. 90 tablet 3  . BYSTOLIC 5 MG tablet TAKE 1 TABLET ONCE DAILY. 30 tablet 0  . Ferrous Sulfate (IRON SLOW RELEASE PO) Take 1 tablet by mouth every other day.     . fluticasone (FLONASE) 50 MCG/ACT nasal spray Place 1 spray into both nostrils as needed for allergies or rhinitis.    . polyvinyl alcohol (LIQUIFILM TEARS) 1.4 % ophthalmic solution Place 1 drop into both eyes daily as needed for dry eyes.    . ramipril (ALTACE) 10 MG capsule TAKE (1) CAPSULE DAILY. 90 capsule 3  . VITAMIN D, CHOLECALCIFEROL, PO Take 5,000 Units by mouth daily.      No current facility-administered medications on file prior to visit.     Past Medical History:  Diagnosis Date  . Anemia   . Blood transfusion without reported diagnosis   . Diverticulosis of colon   . GERD (gastroesophageal reflux disease)   . Hearing loss    right ear, and tinnitus  . Hyperlipidemia   . Hypertension   . Tubular adenoma 03/05/2015   Polyp, and Benign lymphoid polyp.    Past Surgical History:  Procedure Laterality Date  . APPENDECTOMY    . BREAST BIOPSY     benign  . BREAST LUMPECTOMY    . COCHLEAR IMPLANT    .  CRANIECTOMY FOR EXCISION OF ACOUSTIC NEUROMA  2009  . NASAL SEPTUM SURGERY    . TONSILLECTOMY      Social History   Socioeconomic History  . Marital status: Divorced    Spouse name: None  . Number of children: None  . Years of education: None  . Highest education level: None  Social Needs  . Financial resource strain: None  . Food insecurity - worry: None  . Food insecurity - inability: None  . Transportation needs - medical: None  . Transportation needs - non-medical: None  Occupational History  . Occupation: psychologist  Tobacco Use  . Smoking status: Former Smoker    Last attempt to quit: 07/04/1991    Years since  quitting: 26.0  . Smokeless tobacco: Never Used  Substance and Sexual Activity  . Alcohol use: Yes    Alcohol/week: 0.0 oz    Comment: glass wine 4-5 days a week  . Drug use: No  . Sexual activity: Not Currently  Other Topics Concern  . None  Social History Narrative  . None    Family History  Problem Relation Age of Onset  . Sudden death Father 81       MI  . Coronary artery disease Father   . Heart disease Father   . Dementia Mother 58  . Alcohol abuse Brother   . Colon cancer Neg Hx     Review of Systems  Constitutional: Positive for fever (mild, transient). Negative for chills.  Gastrointestinal: Positive for abdominal pain (suprapubic ). Negative for nausea.  Genitourinary: Positive for dysuria, frequency and vaginal discharge. Negative for difficulty urinating and hematuria.       Cloudy urine       Objective:   Vitals:   06/28/17 1014  BP: 134/80  Pulse: 75  Resp: 16  Temp: 98.3 F (36.8 C)  SpO2: 96%   Filed Weights   06/28/17 1014  Weight: 153 lb (69.4 kg)   Body mass index is 24.69 kg/m.  Wt Readings from Last 3 Encounters:  06/28/17 153 lb (69.4 kg)  02/03/17 145 lb (65.8 kg)  01/15/17 151 lb (68.5 kg)     Physical Exam  Constitutional: She appears well-developed and well-nourished. No distress.  HENT:  Head: Normocephalic and atraumatic.  Abdominal: Soft. She exhibits no distension. There is tenderness (minimal suprapubic region). There is no rebound and no guarding.  Genitourinary:  Genitourinary Comments: No flank or cva tenderness  Skin: Skin is warm and dry. She is not diaphoretic.          Assessment & Plan:   See Problem List for Assessment and Plan of chronic medical problems.

## 2017-06-30 LAB — URINE CULTURE
MICRO NUMBER:: 81289827
SPECIMEN QUALITY:: ADEQUATE

## 2017-07-08 ENCOUNTER — Encounter: Payer: Self-pay | Admitting: Internal Medicine

## 2017-07-08 DIAGNOSIS — R350 Frequency of micturition: Secondary | ICD-10-CM

## 2017-07-09 ENCOUNTER — Other Ambulatory Visit (INDEPENDENT_AMBULATORY_CARE_PROVIDER_SITE_OTHER): Payer: Medicare Other

## 2017-07-09 DIAGNOSIS — R350 Frequency of micturition: Secondary | ICD-10-CM | POA: Diagnosis not present

## 2017-07-09 LAB — URINALYSIS, ROUTINE W REFLEX MICROSCOPIC
Bilirubin Urine: NEGATIVE
Hgb urine dipstick: NEGATIVE
Ketones, ur: NEGATIVE
Nitrite: POSITIVE — AB
PH: 5.5 (ref 5.0–8.0)
RBC / HPF: NONE SEEN (ref 0–?)
SPECIFIC GRAVITY, URINE: 1.025 (ref 1.000–1.030)
TOTAL PROTEIN, URINE-UPE24: NEGATIVE
Urine Glucose: 100 — AB
Urobilinogen, UA: 0.2 (ref 0.0–1.0)

## 2017-07-10 ENCOUNTER — Encounter: Payer: Self-pay | Admitting: Internal Medicine

## 2017-07-12 ENCOUNTER — Encounter: Payer: Self-pay | Admitting: Internal Medicine

## 2017-07-12 ENCOUNTER — Other Ambulatory Visit: Payer: Self-pay | Admitting: Internal Medicine

## 2017-07-12 LAB — URINE CULTURE
MICRO NUMBER:: 81323752
SPECIMEN QUALITY: ADEQUATE

## 2017-07-12 MED ORDER — FOSFOMYCIN TROMETHAMINE 3 G PO PACK: 3.0000 g | PACK | Freq: Once | ORAL | 0 refills | Status: AC

## 2017-07-17 NOTE — Progress Notes (Signed)
Subjective:    Patient ID: Emily Sweeney, female    DOB: 07-08-1941, 76 y.o.   MRN: [id number]  HPI The patient is here for follow up.  Recent urinary tract infection: The first antibiotic was not effective.  She continued to have symptoms and when her urine was rechecked there was an infection.  She took fosfomycin and she denies any dysuria or increased urinary frequency.  Her urine is still slightly cloudy, but she thinks the infection has cleared.   Hypertension: She is taking her medication daily. The bystolic is very expensive and she wonders about changing it.  She is compliant with a low sodium diet.  She denies palpitations, edema, shortness of breath and regular headaches. She is exercising regularly.  She does monitor her blood pressure at home.    Prediabetes:  She is compliant with a low sugar/carbohydrate diet.  She is exercising regularly.  Osteopenia: She is taking calcium and vitamin D daily.  She is exercising regularly.  Her last bone density was 2016.  Hyperlipidemia: She is taking her medication daily. She is compliant with a low fat/cholesterol diet. She is exercising regularly. She denies myalgias.   Iron def anemia: She is taking iron pills daily as prescribed.  She is not experiencing any lightheadedness fatigue out of the ordinary.  Medications and allergies reviewed with patient and updated if appropriate.  Patient Active Problem List   Diagnosis Date Noted  . Vaginal candidiasis 06/28/2017  . Prediabetes 07/20/2016  . Iron deficiency anemia 07/01/2016  . Chronic lumbar radiculopathy 06/17/2016  . Poor balance 05/24/2016  . At high risk for falls 05/24/2016  . Gait abnormality 04/27/2016  . Lumbosacral spondylosis without myelopathy 01/19/2016  . S/P excision of acoustic neuroma 01/19/2016  . Pruritus 01/19/2016  . Abdominal gas pain 01/19/2016  . B12 deficiency 10/17/2015  . Pernicious anemia 06/09/2015  . PVC (premature ventricular contraction)  02/25/2015  . Osteopenia, senile 01/12/2015  . Vitamin D deficiency 01/12/2015  . Nonspecific abnormal electrocardiogram (ECG) (EKG) 10/16/2014  . Urinary tract infection without hematuria 07/04/2011  . DIVERTICULITIS OF COLON 10/11/2009  . HYPERCHOLESTEROLEMIA, PURE 10/21/2008  . Essential hypertension 10/21/2008    Current Outpatient Medications on File Prior to Visit  Medication Sig Dispense Refill  . aspirin EC 81 MG tablet Take 1 tablet (81 mg total) by mouth daily. 90 tablet 1  . atorvastatin (LIPITOR) 20 MG tablet Take 1 tablet (20 mg total) by mouth daily. 90 tablet 3  . BYSTOLIC 5 MG tablet TAKE 1 TABLET ONCE DAILY. 30 tablet 0  . Ferrous Sulfate (IRON SLOW RELEASE PO) Take 1 tablet by mouth every other day.     . fluticasone (FLONASE) 50 MCG/ACT nasal spray Place 1 spray into both nostrils as needed for allergies or rhinitis.    . polyvinyl alcohol (LIQUIFILM TEARS) 1.4 % ophthalmic solution Place 1 drop into both eyes daily as needed for dry eyes.    . ramipril (ALTACE) 10 MG capsule TAKE (1) CAPSULE DAILY. 90 capsule 3  . VITAMIN D, CHOLECALCIFEROL, PO Take 5,000 Units by mouth daily.      No current facility-administered medications on file prior to visit.     Past Medical History:  Diagnosis Date  . Anemia   . Blood transfusion without reported diagnosis   . Diverticulosis of colon   . GERD (gastroesophageal reflux disease)   . Hearing loss    right ear, and tinnitus  . Hyperlipidemia   . Hypertension   .  Tubular adenoma 03/05/2015   Polyp, and Benign lymphoid polyp.    Past Surgical History:  Procedure Laterality Date  . APPENDECTOMY    . BREAST BIOPSY     benign  . BREAST LUMPECTOMY    . COCHLEAR IMPLANT    . CRANIECTOMY FOR EXCISION OF ACOUSTIC NEUROMA  2009  . NASAL SEPTUM SURGERY    . TONSILLECTOMY      Social History   Socioeconomic History  . Marital status: Divorced    Spouse name: None  . Number of children: None  . Years of education:  None  . Highest education level: None  Social Needs  . Financial resource strain: None  . Food insecurity - worry: None  . Food insecurity - inability: None  . Transportation needs - medical: None  . Transportation needs - non-medical: None  Occupational History  . Occupation: psychologist  Tobacco Use  . Smoking status: Former Smoker    Last attempt to quit: 07/04/1991    Years since quitting: 26.0  . Smokeless tobacco: Never Used  Substance and Sexual Activity  . Alcohol use: Yes    Alcohol/week: 0.0 oz    Comment: glass wine 4-5 days a week  . Drug use: No  . Sexual activity: Not Currently  Other Topics Concern  . None  Social History Narrative  . None    Family History  Problem Relation Age of Onset  . Sudden death Father 49       MI  . Coronary artery disease Father   . Heart disease Father   . Dementia Mother 64  . Alcohol abuse Brother   . Colon cancer Neg Hx     Review of Systems  Constitutional: Negative for chills and fever.  Respiratory: Positive for cough. Negative for shortness of breath and wheezing.   Cardiovascular: Positive for chest pain (very mild pains - resolved). Negative for palpitations and leg swelling.  Genitourinary: Negative for dysuria and frequency.       Cloudy urine  Neurological: Positive for headaches (occ). Negative for dizziness and light-headedness.       Objective:   Vitals:   07/18/17 1109  BP: 136/78  Pulse: 70  Resp: 16  Temp: 98.3 F (36.8 C)  SpO2: 98%   Wt Readings from Last 3 Encounters:  07/18/17 150 lb (68 kg)  06/28/17 153 lb (69.4 kg)  02/03/17 145 lb (65.8 kg)   Body mass index is 24.21 kg/m.   Physical Exam    Constitutional: Appears well-developed and well-nourished. No distress.  HENT:  Head: Normocephalic and atraumatic.  Neck: Neck supple. No tracheal deviation present. No thyromegaly present.  No cervical lymphadenopathy Cardiovascular: Normal rate, regular rhythm and normal heart sounds.    No murmur heard. No carotid bruit .  No edema Pulmonary/Chest: Effort normal and breath sounds normal. No respiratory distress. No has no wheezes. No rales.  Skin: Skin is warm and dry. Not diaphoretic.  Psychiatric: Normal mood and affect. Behavior is normal.      Assessment & Plan:    See Problem List for Assessment and Plan of chronic medical problems.

## 2017-07-18 ENCOUNTER — Encounter: Payer: Self-pay | Admitting: Internal Medicine

## 2017-07-18 ENCOUNTER — Ambulatory Visit (INDEPENDENT_AMBULATORY_CARE_PROVIDER_SITE_OTHER): Payer: Medicare Other | Admitting: Internal Medicine

## 2017-07-18 VITALS — BP 136/78 | HR 70 | Temp 98.3°F | Resp 16 | Wt 150.0 lb

## 2017-07-18 DIAGNOSIS — R7303 Prediabetes: Secondary | ICD-10-CM

## 2017-07-18 DIAGNOSIS — M85851 Other specified disorders of bone density and structure, right thigh: Secondary | ICD-10-CM

## 2017-07-18 DIAGNOSIS — I10 Essential (primary) hypertension: Secondary | ICD-10-CM | POA: Diagnosis not present

## 2017-07-18 DIAGNOSIS — I493 Ventricular premature depolarization: Secondary | ICD-10-CM | POA: Diagnosis not present

## 2017-07-18 DIAGNOSIS — E7800 Pure hypercholesterolemia, unspecified: Secondary | ICD-10-CM

## 2017-07-18 DIAGNOSIS — D509 Iron deficiency anemia, unspecified: Secondary | ICD-10-CM | POA: Diagnosis not present

## 2017-07-18 DIAGNOSIS — Z13820 Encounter for screening for osteoporosis: Secondary | ICD-10-CM | POA: Diagnosis not present

## 2017-07-18 MED ORDER — METOPROLOL SUCCINATE ER 25 MG PO TB24: 12.5000 mg | ORAL_TABLET | Freq: Every day | ORAL | 3 refills | Status: DC

## 2017-07-18 NOTE — Assessment & Plan Note (Signed)
Check lipid panel  Continue daily statin Regular exercise and healthy diet encouraged  

## 2017-07-18 NOTE — Assessment & Plan Note (Signed)
Continue calcium and vitamin D daily Continue regular exercise Recheck DEXA

## 2017-07-18 NOTE — Assessment & Plan Note (Signed)
Was experiencing occasional palpitations Has not experienced any on Bystolic Bystolic is too expensive-we will discontinue Start metoprolol 12.5 mg daily-we will titrate as needed

## 2017-07-18 NOTE — Assessment & Plan Note (Signed)
Taking slow release iron We will check iron, ferritin, CBC

## 2017-07-18 NOTE — Assessment & Plan Note (Signed)
Check a1c Low sugar / carb diet Stressed regular exercise, weight loss  

## 2017-07-18 NOTE — Assessment & Plan Note (Signed)
BP well controlled Current regimen effective and well tolerated Continue current medications at current doses cmp  

## 2017-07-18 NOTE — Patient Instructions (Signed)
  Test(s) ordered today. Your results will be released to [facility] (or called to you) after review, usually within 72hours after test completion. If any changes need to be made, you will be notified at that same time.  All other Health Maintenance issues reviewed.   All recommended immunizations and age-appropriate screenings are up-to-date or discussed.  No immunizations administered today.   Medications reviewed and updated.  Changes include stopping bystolic and starting metoprolol.  Your prescription(s) have been submitted to your pharmacy. Please take as directed and contact our office if you believe you are having problem(s) with the medication(s).   Please followup in 6 months

## 2017-07-27 ENCOUNTER — Other Ambulatory Visit: Payer: Self-pay | Admitting: Internal Medicine

## 2017-08-23 ENCOUNTER — Ambulatory Visit (INDEPENDENT_AMBULATORY_CARE_PROVIDER_SITE_OTHER): Payer: Medicare Other | Admitting: Family

## 2017-08-23 ENCOUNTER — Encounter: Payer: Self-pay | Admitting: Family

## 2017-08-23 VITALS — BP 132/70 | HR 91 | Temp 98.7°F | Wt 151.0 lb

## 2017-08-23 DIAGNOSIS — B9789 Other viral agents as the cause of diseases classified elsewhere: Secondary | ICD-10-CM

## 2017-08-23 DIAGNOSIS — J028 Acute pharyngitis due to other specified organisms: Principal | ICD-10-CM

## 2017-08-23 DIAGNOSIS — J029 Acute pharyngitis, unspecified: Secondary | ICD-10-CM

## 2017-08-23 MED ORDER — MAGIC MOUTHWASH: 5.0000 mL | Freq: Four times a day (QID) | ORAL | 0 refills | Status: AC | PRN

## 2017-08-23 NOTE — Progress Notes (Signed)
Emily Sweeney is a 77 y.o. female with the following history as recorded in [employer]:  Patient Active Problem List   Diagnosis Date Noted  . Vaginal candidiasis 06/28/2017  . Prediabetes 07/20/2016  . Iron deficiency anemia 07/01/2016  . Chronic lumbar radiculopathy 06/17/2016  . Poor balance 05/24/2016  . At high risk for falls 05/24/2016  . Gait abnormality 04/27/2016  . Lumbosacral spondylosis without myelopathy 01/19/2016  . S/P excision of acoustic neuroma 01/19/2016  . Pruritus 01/19/2016  . Abdominal gas pain 01/19/2016  . B12 deficiency 10/17/2015  . Pernicious anemia 06/09/2015  . PVC (premature ventricular contraction) 02/25/2015  . Osteopenia 01/12/2015  . Vitamin D deficiency 01/12/2015  . Nonspecific abnormal electrocardiogram (ECG) (EKG) 10/16/2014  . Urinary tract infection without hematuria 07/04/2011  . DIVERTICULITIS OF COLON 10/11/2009  . HYPERCHOLESTEROLEMIA, PURE 10/21/2008  . Essential hypertension 10/21/2008    Current Outpatient Medications  Medication Sig Dispense Refill  . aspirin EC 81 MG tablet Take 1 tablet (81 mg total) by mouth daily. 90 tablet 1  . atorvastatin (LIPITOR) 20 MG tablet Take 1 tablet (20 mg total) by mouth daily. 90 tablet 3  . Ferrous Sulfate (IRON SLOW RELEASE PO) Take 1 tablet by mouth every other day.     . fluconazole (DIFLUCAN) 150 MG tablet     . fluticasone (FLONASE) 50 MCG/ACT nasal spray Place 1 spray into both nostrils as needed for allergies or rhinitis.    . metoprolol succinate (TOPROL-XL) 25 MG 24 hr tablet Take 0.5 tablets (12.5 mg total) by mouth daily. 45 tablet 3  . polyvinyl alcohol (LIQUIFILM TEARS) 1.4 % ophthalmic solution Place 1 drop into both eyes daily as needed for dry eyes.    . ramipril (ALTACE) 10 MG capsule TAKE (1) CAPSULE DAILY. 90 capsule 3  . VITAMIN D, CHOLECALCIFEROL, PO Take 5,000 Units by mouth daily.     . magic mouthwash SOLN Take 5 mLs by mouth 4 (four) times daily as needed for up to 5  days for mouth pain. 120 mL 0   No current facility-administered medications for this visit.     Allergies: Norvasc [amlodipine besylate]; Ceftin [cefuroxime axetil]; Ciprofloxacin; Penicillins; and Sulfonamide derivatives  Past Medical History:  Diagnosis Date  . Anemia   . Blood transfusion without reported diagnosis   . Diverticulosis of colon   . GERD (gastroesophageal reflux disease)   . Hearing loss    right ear, and tinnitus  . Hyperlipidemia   . Hypertension   . Tubular adenoma 03/05/2015   Polyp, and Benign lymphoid polyp.    Past Surgical History:  Procedure Laterality Date  . APPENDECTOMY    . BREAST BIOPSY     benign  . BREAST LUMPECTOMY    . COCHLEAR IMPLANT    . CRANIECTOMY FOR EXCISION OF ACOUSTIC NEUROMA  2009  . NASAL SEPTUM SURGERY    . TONSILLECTOMY      Family History  Problem Relation Age of Onset  . Sudden death Father 10       MI  . Coronary artery disease Father   . Heart disease Father   . Dementia Mother 80  . Alcohol abuse Brother   . Colon cancer Neg Hx     Social History   Tobacco Use  . Smoking status: Former Smoker    Last attempt to quit: 07/04/1991    Years since quitting: 26.1  . Smokeless tobacco: Never Used  Substance Use Topics  . Alcohol use: Yes  Alcohol/week: 0.0 oz    Comment: glass wine 4-5 days a week    Subjective:  1 week history of "extremely sore throat." + Red bumps in roof of mouth and back of throat; no fever; no known exposure to strep; has not recently taken antibiotics; no benefit with Benadryl or dental mouthwash;  No difficulty swallowing;   Objective:  Vitals:   08/23/17 1605  BP: 132/70  Pulse: 91  Temp: 98.7 F (37.1 C)  TempSrc: Oral  SpO2: 95%  Weight: 151 lb (68.5 kg)    General: Well developed, well nourished, in no acute distress  Skin : Warm and dry.  Head: Normocephalic and atraumatic  Eyes: Sclera and conjunctiva clear; pupils round and reactive to light; extraocular movements  intact  Ears: External normal; canals clear; tympanic membranes normal  Oropharynx: Pink, supple. C/w aphthous ulcer in posterior pharynx Neck: Supple without thyromegaly, adenopathy  Lungs: Respirations unlabored; clear to auscultation bilaterally without wheeze, rales, rhonchi  CVS exam: normal rate and regular rhythm.  Neurologic: Alert and oriented; speech intact; face symmetrical; moves all extremities well; CNII-XII intact without focal deficit  Assessment:  1. Sore throat (viral)     Plan:  Reassurance; symptomatic treatment discussed; trial of Magic Mouthwash- use as directed; follow-up worse, no better.   No Follow-up on file.  No orders of the defined types were placed in this encounter.   Requested Prescriptions   Signed Prescriptions Disp Refills  . magic mouthwash SOLN 120 mL 0    Sig: Take 5 mLs by mouth 4 (four) times daily as needed for up to 5 days for mouth pain.

## 2017-08-29 ENCOUNTER — Encounter: Payer: Self-pay | Admitting: Family

## 2017-08-29 ENCOUNTER — Other Ambulatory Visit: Payer: Self-pay | Admitting: Family

## 2017-08-29 MED ORDER — MAGIC MOUTHWASH: 5.0000 mL | Freq: Four times a day (QID) | ORAL | 0 refills | Status: DC | PRN

## 2017-09-12 ENCOUNTER — Inpatient Hospital Stay: Admission: RE | Admit: 2017-09-12 | Payer: Medicare Other | Source: Ambulatory Visit

## 2017-09-17 ENCOUNTER — Other Ambulatory Visit (INDEPENDENT_AMBULATORY_CARE_PROVIDER_SITE_OTHER): Payer: Medicare Other

## 2017-09-17 DIAGNOSIS — I10 Essential (primary) hypertension: Secondary | ICD-10-CM | POA: Diagnosis not present

## 2017-09-17 DIAGNOSIS — E7800 Pure hypercholesterolemia, unspecified: Secondary | ICD-10-CM

## 2017-09-17 DIAGNOSIS — R7303 Prediabetes: Secondary | ICD-10-CM

## 2017-09-17 DIAGNOSIS — D509 Iron deficiency anemia, unspecified: Secondary | ICD-10-CM | POA: Diagnosis not present

## 2017-09-17 LAB — LIPID PANEL
CHOLESTEROL: 149 mg/dL (ref 0–200)
HDL: 92.1 mg/dL (ref >39.00)
LDL CALC: 41 mg/dL (ref 0–99)
NonHDL: 57.39
TRIGLYCERIDES: 80 mg/dL (ref 0.0–149.0)
Total CHOL/HDL Ratio: 2
VLDL: 16 mg/dL (ref 0.0–40.0)

## 2017-09-17 LAB — COMPREHENSIVE METABOLIC PANEL
ALK PHOS: 73 U/L (ref 39–117)
ALT: 12 U/L (ref 0–35)
AST: 17 U/L (ref 0–37)
Albumin: 4.3 g/dL (ref 3.5–5.2)
BILIRUBIN TOTAL: 0.5 mg/dL (ref 0.2–1.2)
BUN: 15 mg/dL (ref 6–23)
CO2: 28 mEq/L (ref 19–32)
Calcium: 9.3 mg/dL (ref 8.4–10.5)
Chloride: 101 mEq/L (ref 96–112)
Creatinine, Ser: 0.82 mg/dL (ref 0.40–1.20)
GFR: 71.58 mL/min (ref >60.00)
GLUCOSE: 105 mg/dL — AB (ref 70–99)
POTASSIUM: 4.6 meq/L (ref 3.5–5.1)
SODIUM: 137 meq/L (ref 135–145)
TOTAL PROTEIN: 7.2 g/dL (ref 6.0–8.3)

## 2017-09-17 LAB — CBC WITH DIFFERENTIAL/PLATELET
Basophils Absolute: 0.1 10*3/uL (ref 0.0–0.1)
Basophils Relative: 0.7 % (ref 0.0–3.0)
EOS ABS: 0.1 10*3/uL (ref 0.0–0.7)
Eosinophils Relative: 0.6 % (ref 0.0–5.0)
HCT: 38.5 % (ref 36.0–46.0)
HEMOGLOBIN: 12.5 g/dL (ref 12.0–15.0)
Lymphocytes Relative: 22.3 % (ref 12.0–46.0)
Lymphs Abs: 2.1 10*3/uL (ref 0.7–4.0)
MCHC: 32.5 g/dL (ref 30.0–36.0)
MCV: 91.3 fl (ref 78.0–100.0)
MONO ABS: 1 10*3/uL (ref 0.1–1.0)
Monocytes Relative: 10.3 % (ref 3.0–12.0)
Neutro Abs: 6.2 10*3/uL (ref 1.4–7.7)
Neutrophils Relative %: 66.1 % (ref 43.0–77.0)
Platelets: 259 10*3/uL (ref 150.0–400.0)
RBC: 4.21 Mil/uL (ref 3.87–5.11)
RDW: 14.4 % (ref 11.5–15.5)
WBC: 9.4 10*3/uL (ref 4.0–10.5)

## 2017-09-17 LAB — FERRITIN: Ferritin: 12.6 ng/mL (ref 10.0–291.0)

## 2017-09-17 LAB — HEMOGLOBIN A1C: HEMOGLOBIN A1C: 6.5 % (ref 4.6–6.5)

## 2017-09-17 LAB — IRON: Iron: 158 ug/dL — ABNORMAL HIGH (ref 42–145)

## 2017-09-20 ENCOUNTER — Encounter: Payer: Self-pay | Admitting: Internal Medicine

## 2017-09-20 DIAGNOSIS — E119 Type 2 diabetes mellitus without complications: Secondary | ICD-10-CM | POA: Insufficient documentation

## 2017-12-13 NOTE — Progress Notes (Addendum)
Subjective:   Emily Sweeney is a 77 y.o. female who presents for Medicare Annual (Subsequent) preventive examination.  Review of Systems:  No ROS.  Medicare Wellness Visit. Additional risk factors are reflected in the social history.  Cardiac Risk Factors include: advanced age (>51men, >43 women);hypertension;diabetes mellitus;dyslipidemia Sleep patterns: feels rested on waking, gets up 1 times nightly to void and sleeps 6-7 hours nightly.    Home Safety/Smoke Alarms: Feels safe in home. Smoke alarms in place.  Living environment; residence and Firearm Safety: 2-story house, no firearms., Lives alone, no needs for DME, good support system Seat Belt Safety/Bike Helmet: Wears seat belt.      Objective:     Vitals: BP 138/62   Pulse 88   Resp 18   Ht 5\' 6"  (1.676 m)   Wt 149 lb (67.6 kg)   SpO2 98%   BMI 24.05 kg/m   Body mass index is 24.05 kg/m.  Advanced Directives 12/14/2017 12/07/2016 08/11/2016 06/13/2016 10/17/2015 06/01/2015 02/19/2015  Does Patient Have a Medical Advance Directive? No Yes Yes Yes Yes No;Yes Yes  Type of Advance Directive - [place];Living will [place];Living will [place];Living will [place];Living will Healthcare Power of [place]  Does patient want to make changes to medical advance directive? - - - No - Patient declined No - Patient declined No - Patient declined -  Copy of [place] in Chart? - No - copy requested - No - copy requested Yes - No - copy requested  Would patient like information on creating a medical advance directive? Yes (ED - Information included in AVS) - - - - No - patient declined information -    Tobacco Social History   Tobacco Use  Smoking Status Former Smoker  . Last attempt to quit: 07/04/1991  . Years since quitting: 26.4  Smokeless Tobacco Never Used     Counseling given: Not Answered  Past  Medical History:  Diagnosis Date  . Anemia   . Blood transfusion without reported diagnosis   . Diverticulosis of colon   . GERD (gastroesophageal reflux disease)   . Hearing loss    right ear, and tinnitus  . Hyperlipidemia   . Hypertension   . Tubular adenoma 03/05/2015   Polyp, and Benign lymphoid polyp.   Past Surgical History:  Procedure Laterality Date  . APPENDECTOMY    . BREAST BIOPSY     benign  . COCHLEAR IMPLANT    . CRANIECTOMY FOR EXCISION OF ACOUSTIC NEUROMA  2009  . NASAL SEPTUM SURGERY    . TONSILLECTOMY     Family History  Problem Relation Age of Onset  . Sudden death Father 12       MI  . Coronary artery disease Father   . Heart disease Father   . Dementia Mother 69  . Alcohol abuse Brother   . Colon cancer Neg Hx    Social History   Socioeconomic History  . Marital status: Divorced    Spouse name: Not on file  . Number of children: Not on file  . Years of education: Not on file  . Highest education level: Not on file  Occupational History  . Occupation: psychologist  Social Needs  . Financial resource strain: Not hard at all  . Food insecurity:    Worry: Never true    Inability: Never true  . Transportation needs:    Medical: No  Non-medical: No  Tobacco Use  . Smoking status: Former Smoker    Last attempt to quit: 07/04/1991    Years since quitting: 26.4  . Smokeless tobacco: Never Used  Substance and Sexual Activity  . Alcohol use: Yes    Alcohol/week: 0.0 oz    Comment: glass Emily Sweeney 4-5 days a week  . Drug use: No  . Sexual activity: Not Currently  Lifestyle  . Physical activity:    Days per week: 4 days    Minutes per session: 50 min  . Stress: Not at all  Relationships  . Social connections:    Talks on phone: More than three times a week    Gets together: More than three times a week    Attends religious service: More than 4 times per year    Active member of club or organization: Yes    Attends meetings of clubs or  organizations: More than 4 times per year    Relationship status: Divorced  Other Topics Concern  . Not on file  Social History Narrative  . Not on file    Outpatient Encounter Medications as of 12/14/2017  Medication Sig  . aspirin EC 81 MG tablet Take 1 tablet (81 mg total) by mouth daily.  Marland Kitchen atorvastatin (LIPITOR) 20 MG tablet Take 1 tablet (20 mg total) by mouth daily.  . Ferrous Sulfate (IRON SLOW RELEASE PO) Take 1 tablet by mouth every other day.   . fluticasone (FLONASE) 50 MCG/ACT nasal spray Place 1 spray into both nostrils as needed for allergies or rhinitis.  . magic mouthwash SOLN Take 5 mLs by mouth 4 (four) times daily as needed for mouth pain.  . metoprolol succinate (TOPROL-XL) 25 MG 24 hr tablet Take 0.5 tablets (12.5 mg total) by mouth daily.  . polyvinyl alcohol (LIQUIFILM TEARS) 1.4 % ophthalmic solution Place 1 drop into both eyes daily as needed for dry eyes.  . ramipril (ALTACE) 10 MG capsule TAKE (1) CAPSULE DAILY.  Marland Kitchen VITAMIN D, CHOLECALCIFEROL, PO Take 5,000 Units by mouth daily.   . [DISCONTINUED] fluconazole (DIFLUCAN) 150 MG tablet    No facility-administered encounter medications on file as of 12/14/2017.     Activities of Daily Living In your present state of health, do you have any difficulty performing the following activities: 12/14/2017  Hearing? N  Vision? N  Difficulty concentrating or making decisions? N  Walking or climbing stairs? N  Dressing or bathing? N  Doing errands, shopping? N  Preparing Food and eating ? N  Using the Toilet? N  In the past six months, have you accidently leaked urine? N  Do you have problems with loss of bowel control? N  Managing your Medications? N  Managing your Finances? N  Housekeeping or managing your Housekeeping? N  Some recent data might be hidden    Patient Care Team: Binnie Rail, MD as PCP - General (Internal Medicine) Katy Apo, MD (Ophthalmology)    Assessment:   This is a routine wellness  examination for Emily Sweeney. Physical assessment deferred to PCP.   Exercise Activities and Dietary recommendations Current Exercise Habits: Home exercise routine, Type of exercise: walking, Exercise limited by: orthopedic condition(s)  Diet (meal preparation, eat out, water intake, caffeinated beverages, dairy products, fruits and vegetables): in general, a "healthy" diet  , well balanced, eats a variety of fruits and vegetables daily, limits salt, fat/cholesterol, sugar,carbohydrates,caffeine, drinks 6-8 glasses of water daily.     Goals    . Patient Stated  Continue to be as healthy and as independent as possible. I will continue to walk and be physically active, enjoy life and family and love my dog.        Fall Risk Fall Risk  12/14/2017 12/07/2016 06/27/2016 06/09/2016 01/19/2016  Falls in the past year? Yes Yes No No No  Number falls in past yr: 1 1 - - -  Injury with Fall? Yes No - - -  Risk Factor Category  High Fall Risk - - - -  Risk for fall due to : Impaired mobility;Impaired balance/gait Impaired balance/gait;Impaired mobility - - -  Follow up Falls prevention discussed Falls prevention discussed;Education provided - - -    Depression Screen PHQ 2/9 Scores 12/14/2017 12/07/2016 01/19/2016 10/17/2015  PHQ - 2 Score 0 0 0 0  PHQ- 9 Score 0 0 - -     Cognitive Function MMSE - Mini Mental State Exam 12/14/2017  Not completed: Refused       Ad8 score reviewed for issues:  Issues making decisions: no  Less interest in hobbies / activities: no  Repeats questions, stories (family complaining): no  Trouble using ordinary gadgets (microwave, computer, phone):no  Forgets the month or year: no  Mismanaging finances: no  Remembering appts: no  Daily problems with thinking and/or memory: no Ad8 score is= 0   Immunization History  Administered Date(s) Administered  . Influenza Split 07/04/2011, 09/15/2011  . Influenza Whole 05/14/2008, 06/10/2010  . Influenza, High  Dose Seasonal PF 06/18/2013, 05/02/2016, 05/25/2017  . Influenza,inj,Quad PF,6+ Mos 09/23/2014, 06/09/2015  . Pneumococcal Conjugate-13 10/16/2014  . Pneumococcal Polysaccharide-23 10/21/2008, 10/14/2015  . Td 11/13/2006  . Tdap 05/25/2017  . Zoster 12/28/2006   Screening Tests Health Maintenance  Topic Date Due  . OPHTHALMOLOGY EXAM  09/12/2013  . FOOT EXAM  06/18/2014  . INFLUENZA VACCINE  03/14/2018  . HEMOGLOBIN A1C  03/17/2018  . COLONOSCOPY  03/07/2020  . TETANUS/TDAP  05/26/2027  . DEXA SCAN  Completed  . PNA vac Low Risk Adult  Completed      Plan:     I have personally reviewed and noted the following in the patient's chart:   . Medical and social history . Use of alcohol, tobacco or illicit drugs  . Current medications and supplements . Functional ability and status . Nutritional status . Physical activity . Advanced directives . List of other physicians . Vitals . Screenings to include cognitive, depression, and falls . Referrals and appointments  In addition, I have reviewed and discussed with patient certain preventive protocols, quality metrics, and best practice recommendations. A written personalized care plan for preventive services as well as general preventive health recommendations were provided to patient.     Michiel Cowboy, RN  12/14/2017   Medical screening examination/treatment/procedure(s) were performed by non-physician practitioner and as supervising physician I was immediately available for consultation/collaboration. I agree with above. Binnie Rail, MD

## 2017-12-14 ENCOUNTER — Ambulatory Visit (INDEPENDENT_AMBULATORY_CARE_PROVIDER_SITE_OTHER): Payer: Medicare Other | Admitting: *Deleted

## 2017-12-14 VITALS — BP 138/62 | HR 88 | Resp 18 | Ht 66.0 in | Wt 149.0 lb

## 2017-12-14 DIAGNOSIS — E2839 Other primary ovarian failure: Secondary | ICD-10-CM | POA: Diagnosis not present

## 2017-12-14 DIAGNOSIS — Z0000 Encounter for general adult medical examination without abnormal findings: Secondary | ICD-10-CM

## 2017-12-14 DIAGNOSIS — E119 Type 2 diabetes mellitus without complications: Secondary | ICD-10-CM | POA: Diagnosis not present

## 2017-12-14 NOTE — Patient Instructions (Signed)
Continue doing brain stimulating activities (puzzles, reading, adult coloring books, staying active) to keep memory sharp.   Continue to eat heart healthy diet (full of fruits, vegetables, whole grains, lean protein, water--limit salt, fat, and sugar intake) and increase physical activity as tolerated.   Emily Sweeney , Thank you for taking time to come for your Medicare Wellness Visit. I appreciate your ongoing commitment to your health goals. Please review the following plan we discussed and let me know if I can assist you in the future.   These are the goals we discussed: Goals    . Patient Stated     Continue to be as healthy and as independent as possible. I will continue to walk and be physically active, enjoy life and family and love my dog.        This is a list of the screening recommended for you and due dates:  Health Maintenance  Topic Date Due  . Eye exam for diabetics  09/12/2013  . Complete foot exam   06/18/2014  . Flu Shot  03/14/2018  . Hemoglobin A1C  03/17/2018  . Colon Cancer Screening  03/07/2020  . Tetanus Vaccine  05/26/2027  . DEXA scan (bone density measurement)  Completed  . Pneumonia vaccines  Completed

## 2017-12-17 ENCOUNTER — Ambulatory Visit (INDEPENDENT_AMBULATORY_CARE_PROVIDER_SITE_OTHER)
Admission: RE | Admit: 2017-12-17 | Discharge: 2017-12-17 | Disposition: A | Discharge status: Home / Self Care | Payer: Medicare Other | Source: Ambulatory Visit | Attending: Internal Medicine | Admitting: Internal Medicine

## 2017-12-17 DIAGNOSIS — E2839 Other primary ovarian failure: Secondary | ICD-10-CM | POA: Diagnosis not present

## 2017-12-20 ENCOUNTER — Encounter: Payer: Self-pay | Admitting: Internal Medicine

## 2017-12-25 ENCOUNTER — Encounter: Payer: Self-pay | Admitting: Internal Medicine

## 2017-12-25 ENCOUNTER — Ambulatory Visit (INDEPENDENT_AMBULATORY_CARE_PROVIDER_SITE_OTHER): Payer: Medicare Other | Admitting: Internal Medicine

## 2017-12-25 ENCOUNTER — Other Ambulatory Visit: Payer: Medicare Other

## 2017-12-25 VITALS — BP 120/80 | HR 91 | Temp 98.1°F | Ht 66.0 in | Wt 143.0 lb

## 2017-12-25 DIAGNOSIS — N3000 Acute cystitis without hematuria: Secondary | ICD-10-CM

## 2017-12-25 LAB — POCT URINALYSIS DIPSTICK
BILIRUBIN UA: NEGATIVE
Glucose, UA: NEGATIVE
KETONES UA: NEGATIVE
Nitrite, UA: POSITIVE
PH UA: 6 (ref 5.0–8.0)
PROTEIN UA: NEGATIVE
RBC UA: NEGATIVE
SPEC GRAV UA: 1.015 (ref 1.010–1.025)
Urobilinogen, UA: 0.2 E.U./dL

## 2017-12-25 MED ORDER — NITROFURANTOIN MONOHYD MACRO 100 MG PO CAPS: 100.0000 mg | ORAL_CAPSULE | Freq: Two times a day (BID) | ORAL | 0 refills | Status: DC

## 2017-12-25 NOTE — Progress Notes (Signed)
   Subjective:    Patient ID: Emily Sweeney, female    DOB: 03/04/1941, 77 y.o.   MRN: [id number]  HPI The patient is a 77 YO female coming in for lower abdomen pain starting a couple of days ago. She then started having burning with urination in the last day. She took home test which told her she has uti. She came in for treatment due to prior culture with resistance and multiple allergies to medicines. She has been drinking more fluids.   Review of Systems  Constitutional: Negative.   Respiratory: Negative.   Cardiovascular: Negative.   Gastrointestinal: Positive for abdominal pain. Negative for abdominal distention, constipation, diarrhea, nausea and vomiting.  Genitourinary: Positive for dysuria, frequency and urgency.  Musculoskeletal: Negative.   Skin: Negative.       Objective:   Physical Exam  Constitutional: She is oriented to person, place, and time. She appears well-developed and well-nourished.  HENT:  Head: Normocephalic and atraumatic.  Eyes: EOM are normal.  Neck: Normal range of motion.  Cardiovascular: Normal rate and regular rhythm.  Pulmonary/Chest: Effort normal and breath sounds normal. No respiratory distress. She has no wheezes. She has no rales.  Abdominal: Soft. Bowel sounds are normal. She exhibits no distension. There is tenderness. There is no rebound.  Suprapubic tenderness  Neurological: She is alert and oriented to person, place, and time. Coordination normal.  Skin: Skin is warm and dry.   Vitals:   12/25/17 1457  BP: 120/80  Pulse: 91  Temp: 98.1 F (36.7 C)  TempSrc: Oral  SpO2: 94%  Weight: 143 lb (64.9 kg)  Height: 5\' 6"  (1.676 m)      Assessment & Plan:

## 2017-12-25 NOTE — Assessment & Plan Note (Signed)
Rx for macrobid. POC U/A done in the office consistent with infection. Needs urine culture due to multiple allergies and resistant organism 06/2017.

## 2017-12-25 NOTE — Patient Instructions (Signed)
We have sent in macrobid to the pharmacy. Take 1 pill twice a day for 1 week.

## 2017-12-26 LAB — URINE CULTURE
MICRO NUMBER: 90586119
SPECIMEN QUALITY: ADEQUATE

## 2017-12-27 ENCOUNTER — Encounter: Payer: Self-pay | Admitting: Internal Medicine

## 2017-12-29 ENCOUNTER — Ambulatory Visit (INDEPENDENT_AMBULATORY_CARE_PROVIDER_SITE_OTHER): Payer: Medicare Other | Admitting: Family Medicine

## 2017-12-29 ENCOUNTER — Encounter: Payer: Self-pay | Admitting: Family Medicine

## 2017-12-29 VITALS — BP 120/84 | HR 106 | Temp 98.6°F | Resp 14 | Ht 66.0 in | Wt 146.0 lb

## 2017-12-29 DIAGNOSIS — R300 Dysuria: Secondary | ICD-10-CM

## 2017-12-29 DIAGNOSIS — R5383 Other fatigue: Secondary | ICD-10-CM | POA: Diagnosis not present

## 2017-12-29 LAB — POCT URINALYSIS DIPSTICK
BILIRUBIN UA: NEGATIVE
GLUCOSE UA: NEGATIVE
Leukocytes, UA: NEGATIVE
Nitrite, UA: NEGATIVE
RBC UA: NEGATIVE
SPEC GRAV UA: 1.015 (ref 1.010–1.025)
Urobilinogen, UA: 0.2 E.U./dL
pH, UA: 6 (ref 5.0–8.0)

## 2017-12-29 NOTE — Progress Notes (Signed)
Subjective:     Patient ID: Emily Sweeney, female   DOB: 08-24-1940, 77 y.o.   MRN: [id number]  HPI Patient seen Saturday clinic with some vague symptoms of achiness and fatigue. Her recent history is that she started on 5/14 with possible UTI symptoms with burning and low-grade fever. She was seen in this clinic and urinalysis obtained. She was started Macrobid and culture came back subsequently negative.  She still has mild burning with urination.   She has some mild body aches and fatigue. No nausea or vomiting. Mild low back pain. No vaginal discharge.  No skin rash.  No headache.  Past Medical History:  Diagnosis Date  . Anemia   . Blood transfusion without reported diagnosis   . Diverticulosis of colon   . GERD (gastroesophageal reflux disease)   . Hearing loss    right ear, and tinnitus  . Hyperlipidemia   . Hypertension   . Tubular adenoma 03/05/2015   Polyp, and Benign lymphoid polyp.   Past Surgical History:  Procedure Laterality Date  . APPENDECTOMY    . BREAST BIOPSY     benign  . COCHLEAR IMPLANT    . CRANIECTOMY FOR EXCISION OF ACOUSTIC NEUROMA  2009  . NASAL SEPTUM SURGERY    . TONSILLECTOMY      reports that she quit smoking about 26 years ago. She has never used smokeless tobacco. She reports that she drinks alcohol. She reports that she does not use drugs. family history includes Alcohol abuse in her brother; Coronary artery disease in her father; Dementia (age of onset: 58) in her mother; Heart disease in her father; Sudden death (age of onset: 23) in her father. Allergies  Allergen Reactions  . Norvasc [Amlodipine Besylate]     Palpitations, low bp  . Ceftin [Cefuroxime Axetil] Hives and Rash  . Ciprofloxacin Rash  . Penicillins Hives and Rash    Has patient had a PCN reaction causing immediate rash, facial/tongue/throat swelling, SOB or lightheadedness with hypotension: No Has patient had a PCN reaction causing severe rash involving mucus membranes or  skin necrosis: Fayrene Fearing Has patient had a PCN reaction that required hospitalization No Has patient had a PCN reaction occurring within the last 10 years: No If all of the above answers are "NO", then may proceed with Cephalosporin use.   . Sulfonamide Derivatives Hives and Rash       Review of Systems  Constitutional: Positive for fatigue. Negative for chills and fever.  Respiratory: Negative for cough.   Cardiovascular: Negative for chest pain.  Gastrointestinal: Negative for diarrhea, nausea and vomiting.  Genitourinary: Positive for dysuria. Negative for hematuria.  Musculoskeletal: Positive for myalgias.  Skin: Negative for rash.       Objective:   Physical Exam  Constitutional: She appears well-developed and well-nourished.  Abdominal: Soft. Normal appearance and bowel sounds are normal. She exhibits no mass. There is no rigidity, no rebound and no guarding.       Assessment:     Patient presents with vague symptoms and some myalgias and arthralgias with negative recent urinary culture. ?side effect from [place].  Follow-up urine dipstick today shows only trace ketosis but no leukocyts or nitrite    Plan:     -discontinue Macrobid at this time (with recent negative cx). -Monitor temperatures closely and follow-up for any persistent fever or worsening symptoms -follow up with primary early week if symptoms persist.  Eulas Post MD [facility]

## 2017-12-29 NOTE — Patient Instructions (Signed)
Stop the Macrobid  Stay well hydrated  Follow up promptly or go to ER for any persistent fever, recurrent vomiting, or any progressive pain  Repeat urine today does not show any sign of infection and recent urine culture was negative.

## 2018-01-15 NOTE — Patient Instructions (Addendum)
  Medications reviewed and updated.  No changes recommended at this time.     Please followup in 6 months   

## 2018-01-15 NOTE — Progress Notes (Signed)
Subjective:    Patient ID: Emily Sweeney, female    DOB: 1940-10-30, 77 y.o.   MRN: [id number]  HPI The patient is here for follow up.  Hypertension: She is taking her medication daily. She is compliant with a low sodium diet.  She denies chest pain, palpitations, edema, shortness of breath and regular headaches. She is exercising regularly.      Hyperlipidemia: She is taking her medication daily. She is compliant with a low fat/cholesterol diet. She is exercising regularly. She denies myalgias.   Diabetes: She is controlling her sugars with lifestyle. She is compliant with a diabetic diet. She is exercising regularly - walking. She checks her feet daily and denies foot lesions. She is up-to-date with an ophthalmology examination.     Osteopenia with high frax: She would like to review the bone density scan.  Her most recent bone scan showed osteopenia with high FRAX.  She is exercising regularly.  She is taking vitamin D daily.  She has never been on medication.  Lumbar radiculopathy:  She has been having lower back pain and left leg pain that feels it is similar to prior episodes of lumbar radiculopathy.  It started over one month.  She is doing her PT exercises and walking regularly.  She has had injections in the past and does not feel she needs them now.     Medications and allergies reviewed with patient and updated if appropriate.  Patient Active Problem List   Diagnosis Date Noted  . Diabetes mellitus without complication ([place]) 44/10/4740  . Iron deficiency anemia 07/01/2016  . Chronic lumbar radiculopathy 06/17/2016  . Poor balance 05/24/2016  . At high risk for falls 05/24/2016  . Gait abnormality 04/27/2016  . Lumbosacral spondylosis without myelopathy 01/19/2016  . S/P excision of acoustic neuroma 01/19/2016  . Abdominal gas pain 01/19/2016  . B12 deficiency 10/17/2015  . Pernicious anemia 06/09/2015  . PVC (premature ventricular contraction) 02/25/2015  .  Osteopenia 01/12/2015  . Vitamin D deficiency 01/12/2015  . Nonspecific abnormal electrocardiogram (ECG) (EKG) 10/16/2014  . DIVERTICULITIS OF COLON 10/11/2009  . HYPERCHOLESTEROLEMIA, PURE 10/21/2008  . Essential hypertension 10/21/2008    Current Outpatient Medications on File Prior to Visit  Medication Sig Dispense Refill  . aspirin EC 81 MG tablet Take 1 tablet (81 mg total) by mouth daily. 90 tablet 1  . atorvastatin (LIPITOR) 20 MG tablet Take 1 tablet (20 mg total) by mouth daily. 90 tablet 3  . fluticasone (FLONASE) 50 MCG/ACT nasal spray Place 1 spray into both nostrils as needed for allergies or rhinitis.    . metoprolol succinate (TOPROL-XL) 25 MG 24 hr tablet Take 0.5 tablets (12.5 mg total) by mouth daily. 45 tablet 3  . ramipril (ALTACE) 10 MG capsule TAKE (1) CAPSULE DAILY. 90 capsule 3  . VITAMIN D, CHOLECALCIFEROL, PO Take 5,000 Units by mouth daily.      No current facility-administered medications on file prior to visit.     Past Medical History:  Diagnosis Date  . Anemia   . Blood transfusion without reported diagnosis   . Diverticulosis of colon   . GERD (gastroesophageal reflux disease)   . Hearing loss    right ear, and tinnitus  . Hyperlipidemia   . Hypertension   . Tubular adenoma 03/05/2015   Polyp, and Benign lymphoid polyp.    Past Surgical History:  Procedure Laterality Date  . APPENDECTOMY    . BREAST BIOPSY     benign  .  COCHLEAR IMPLANT    . CRANIECTOMY FOR EXCISION OF ACOUSTIC NEUROMA  2009  . NASAL SEPTUM SURGERY    . TONSILLECTOMY      Social History   Socioeconomic History  . Marital status: Divorced    Spouse name: Not on file  . Number of children: Not on file  . Years of education: Not on file  . Highest education level: Not on file  Occupational History  . Occupation: psychologist  Social Needs  . Financial resource strain: Not hard at all  . Food insecurity:    Worry: Never true    Inability: Never true  .  Transportation needs:    Medical: No    Non-medical: No  Tobacco Use  . Smoking status: Former Smoker    Last attempt to quit: 07/04/1991    Years since quitting: 26.5  . Smokeless tobacco: Never Used  Substance and Sexual Activity  . Alcohol use: Yes    Alcohol/week: 0.0 oz    Comment: glass wine 4-5 days a week  . Drug use: No  . Sexual activity: Not Currently  Lifestyle  . Physical activity:    Days per week: 4 days    Minutes per session: 50 min  . Stress: Not at all  Relationships  . Social connections:    Talks on phone: More than three times a week    Gets together: More than three times a week    Attends religious service: More than 4 times per year    Active member of club or organization: Yes    Attends meetings of clubs or organizations: More than 4 times per year    Relationship status: Divorced  Other Topics Concern  . Not on file  Social History Narrative  . Not on file    Family History  Problem Relation Age of Onset  . Sudden death Father 48       MI  . Coronary artery disease Father   . Heart disease Father   . Dementia Mother 16  . Alcohol abuse Brother   . Colon cancer Neg Hx     Review of Systems  Constitutional: Negative for chills and fever.  Respiratory: Negative for cough, shortness of breath and wheezing.   Cardiovascular: Negative for chest pain, palpitations and leg swelling.  Gastrointestinal:       No change in bowel or bladder  Genitourinary:       No incontinence  Musculoskeletal: Positive for back pain (left leg pain).  Neurological: Negative for weakness, light-headedness, numbness and headaches.       Objective:   Vitals:   01/16/18 0856  BP: (!) 142/84  Pulse: 90  Resp: 16  Temp: 98.3 F (36.8 C)  SpO2: 96%   BP Readings from Last 3 Encounters:  01/16/18 (!) 142/84  12/29/17 120/84  12/25/17 120/80   Wt Readings from Last 3 Encounters:  01/16/18 146 lb (66.2 kg)  12/29/17 146 lb (66.2 kg)  12/25/17 143 lb  (64.9 kg)   Body mass index is 23.57 kg/m.   Physical Exam    Constitutional: Appears well-developed and well-nourished. No distress.  HENT:  Head: Normocephalic and atraumatic.  Neck: Neck supple. No tracheal deviation present. No thyromegaly present.  No cervical lymphadenopathy Cardiovascular: Normal rate, regular rhythm and normal heart sounds.   No murmur heard. No carotid bruit .  No edema Pulmonary/Chest: Effort normal and breath sounds normal. No respiratory distress. No has no wheezes. No rales.  Skin: Skin  is warm and dry. Not diaphoretic.  Psychiatric: Normal mood and affect. Behavior is normal.      Assessment & Plan:    See Problem List for Assessment and Plan of chronic medical problems.

## 2018-01-16 ENCOUNTER — Encounter: Payer: Self-pay | Admitting: Internal Medicine

## 2018-01-16 ENCOUNTER — Ambulatory Visit (INDEPENDENT_AMBULATORY_CARE_PROVIDER_SITE_OTHER): Payer: Medicare Other | Admitting: Internal Medicine

## 2018-01-16 VITALS — BP 142/84 | HR 90 | Temp 98.3°F | Resp 16 | Wt 146.0 lb

## 2018-01-16 DIAGNOSIS — I10 Essential (primary) hypertension: Secondary | ICD-10-CM | POA: Diagnosis not present

## 2018-01-16 DIAGNOSIS — M85851 Other specified disorders of bone density and structure, right thigh: Secondary | ICD-10-CM

## 2018-01-16 DIAGNOSIS — M47817 Spondylosis without myelopathy or radiculopathy, lumbosacral region: Secondary | ICD-10-CM | POA: Diagnosis not present

## 2018-01-16 DIAGNOSIS — E119 Type 2 diabetes mellitus without complications: Secondary | ICD-10-CM

## 2018-01-16 NOTE — Assessment & Plan Note (Signed)
Blood pressure elevated here in the office, but typically better controlled Will monitor for now-no changes Continue regular exercise and healthy diet

## 2018-01-16 NOTE — Assessment & Plan Note (Signed)
Had injections by Dr Lovenia Shuck in past Having lower back pain and left leg pain similar to prior episodes She is doing PT If it gets worse - will see Dr Lovenia Shuck

## 2018-01-16 NOTE — Assessment & Plan Note (Addendum)
Has improved diet and likely sugars are much better controlled Deferred A1c until next visit Diet controlled Low sugar / carb diet Continue regular exercise

## 2018-01-16 NOTE — Assessment & Plan Note (Signed)
Reviewed dexa - osteopenia with high frax Discussed options Continue vitamin d, regular exercise Deferred treatment dexa in 2 years

## 2018-02-07 ENCOUNTER — Other Ambulatory Visit: Payer: Self-pay | Admitting: Internal Medicine

## 2018-02-26 ENCOUNTER — Encounter: Payer: Self-pay | Admitting: Internal Medicine

## 2018-02-27 ENCOUNTER — Ambulatory Visit: Payer: Medicare Other | Admitting: Family

## 2018-04-02 DIAGNOSIS — T25229A Burn of second degree of unspecified foot, initial encounter: Secondary | ICD-10-CM | POA: Diagnosis not present

## 2018-04-05 DIAGNOSIS — Z880 Allergy status to penicillin: Secondary | ICD-10-CM | POA: Diagnosis not present

## 2018-04-05 DIAGNOSIS — Z882 Allergy status to sulfonamides status: Secondary | ICD-10-CM | POA: Diagnosis not present

## 2018-04-05 DIAGNOSIS — Z888 Allergy status to other drugs, medicaments and biological substances status: Secondary | ICD-10-CM | POA: Diagnosis not present

## 2018-04-05 DIAGNOSIS — T25291A Burn of second degree of multiple sites of right ankle and foot, initial encounter: Secondary | ICD-10-CM | POA: Diagnosis not present

## 2018-04-05 DIAGNOSIS — T25191A Burn of first degree of multiple sites of right ankle and foot, initial encounter: Secondary | ICD-10-CM | POA: Diagnosis not present

## 2018-04-05 DIAGNOSIS — T25292A Burn of second degree of multiple sites of left ankle and foot, initial encounter: Secondary | ICD-10-CM | POA: Diagnosis not present

## 2018-04-05 DIAGNOSIS — I10 Essential (primary) hypertension: Secondary | ICD-10-CM | POA: Diagnosis not present

## 2018-04-05 DIAGNOSIS — Z87891 Personal history of nicotine dependence: Secondary | ICD-10-CM | POA: Diagnosis not present

## 2018-04-05 DIAGNOSIS — Z7982 Long term (current) use of aspirin: Secondary | ICD-10-CM | POA: Diagnosis not present

## 2018-04-05 DIAGNOSIS — Z79899 Other long term (current) drug therapy: Secondary | ICD-10-CM | POA: Diagnosis not present

## 2018-04-05 DIAGNOSIS — T25192A Burn of first degree of multiple sites of left ankle and foot, initial encounter: Secondary | ICD-10-CM | POA: Diagnosis not present

## 2018-04-05 DIAGNOSIS — E785 Hyperlipidemia, unspecified: Secondary | ICD-10-CM | POA: Diagnosis not present
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
## 2018-04-08 ENCOUNTER — Encounter: Payer: Self-pay | Admitting: Internal Medicine

## 2018-04-08 ENCOUNTER — Ambulatory Visit (INDEPENDENT_AMBULATORY_CARE_PROVIDER_SITE_OTHER): Payer: Medicare Other | Admitting: Internal Medicine

## 2018-04-08 VITALS — BP 150/82 | HR 71 | Temp 98.0°F | Resp 16 | Ht 66.0 in | Wt 147.0 lb

## 2018-04-08 DIAGNOSIS — T25221A Burn of second degree of right foot, initial encounter: Secondary | ICD-10-CM | POA: Insufficient documentation

## 2018-04-08 DIAGNOSIS — T25222A Burn of second degree of left foot, initial encounter: Secondary | ICD-10-CM | POA: Insufficient documentation

## 2018-04-08 NOTE — Assessment & Plan Note (Signed)
Continue diaper rash cream and meta honey Continue to keep bandaged Complete doxycycline course Vicodin as needed for pain Referred to the wound center Monitor closely for infection Tetanus up-to-date  Call with questions or concerns

## 2018-04-08 NOTE — Patient Instructions (Signed)
Continue the local wound care that you are currently doing.    A referral was ordered for the wound center.

## 2018-04-09 DIAGNOSIS — L97521 Non-pressure chronic ulcer of other part of left foot limited to breakdown of skin: Secondary | ICD-10-CM | POA: Diagnosis not present

## 2018-04-09 DIAGNOSIS — T25222A Burn of second degree of left foot, initial encounter: Secondary | ICD-10-CM | POA: Diagnosis not present

## 2018-04-09 DIAGNOSIS — T25221A Burn of second degree of right foot, initial encounter: Secondary | ICD-10-CM | POA: Diagnosis not present

## 2018-04-09 DIAGNOSIS — L97512 Non-pressure chronic ulcer of other part of right foot with fat layer exposed: Secondary | ICD-10-CM | POA: Diagnosis not present

## 2018-04-23 DIAGNOSIS — T25222D Burn of second degree of left foot, subsequent encounter: Secondary | ICD-10-CM | POA: Diagnosis not present

## 2018-04-23 DIAGNOSIS — T25222A Burn of second degree of left foot, initial encounter: Secondary | ICD-10-CM | POA: Diagnosis not present

## 2018-04-23 DIAGNOSIS — T25221A Burn of second degree of right foot, initial encounter: Secondary | ICD-10-CM | POA: Diagnosis not present

## 2018-04-23 DIAGNOSIS — L97512 Non-pressure chronic ulcer of other part of right foot with fat layer exposed: Secondary | ICD-10-CM | POA: Diagnosis not present

## 2018-04-23 DIAGNOSIS — T25221D Burn of second degree of right foot, subsequent encounter: Secondary | ICD-10-CM | POA: Diagnosis not present

## 2018-04-23 DIAGNOSIS — L97521 Non-pressure chronic ulcer of other part of left foot limited to breakdown of skin: Secondary | ICD-10-CM | POA: Diagnosis not present

## 2018-04-26 ENCOUNTER — Other Ambulatory Visit: Payer: Self-pay | Admitting: Family

## 2018-04-26 ENCOUNTER — Encounter: Payer: Self-pay | Admitting: Internal Medicine

## 2018-05-07 ENCOUNTER — Other Ambulatory Visit: Payer: Self-pay | Admitting: Internal Medicine

## 2018-05-31 ENCOUNTER — Encounter: Payer: Self-pay | Admitting: Internal Medicine

## 2018-05-31 DIAGNOSIS — H524 Presbyopia: Secondary | ICD-10-CM | POA: Diagnosis not present

## 2018-05-31 DIAGNOSIS — H2513 Age-related nuclear cataract, bilateral: Secondary | ICD-10-CM | POA: Diagnosis not present

## 2018-05-31 LAB — HM DIABETES EYE EXAM

## 2018-06-13 ENCOUNTER — Emergency Department (HOSPITAL_COMMUNITY): Payer: Medicare Other

## 2018-06-13 ENCOUNTER — Ambulatory Visit (INDEPENDENT_AMBULATORY_CARE_PROVIDER_SITE_OTHER): Payer: Medicare Other | Admitting: Family Medicine

## 2018-06-13 ENCOUNTER — Encounter (HOSPITAL_COMMUNITY): Payer: Self-pay | Admitting: Emergency Medicine

## 2018-06-13 ENCOUNTER — Other Ambulatory Visit: Payer: Self-pay

## 2018-06-13 ENCOUNTER — Emergency Department (HOSPITAL_COMMUNITY)
Admission: EM | Admit: 2018-06-13 | Discharge: 2018-06-13 | Disposition: A | Discharge status: Home / Self Care | Payer: Medicare Other | Attending: Emergency Medicine | Admitting: Emergency Medicine

## 2018-06-13 VITALS — BP 184/106 | HR 116 | Temp 97.8°F

## 2018-06-13 DIAGNOSIS — Y9389 Activity, other specified: Secondary | ICD-10-CM | POA: Diagnosis not present

## 2018-06-13 DIAGNOSIS — W010XXA Fall on same level from slipping, tripping and stumbling without subsequent striking against object, initial encounter: Secondary | ICD-10-CM | POA: Insufficient documentation

## 2018-06-13 DIAGNOSIS — S0990XA Unspecified injury of head, initial encounter: Secondary | ICD-10-CM | POA: Diagnosis not present

## 2018-06-13 DIAGNOSIS — R55 Syncope and collapse: Secondary | ICD-10-CM | POA: Diagnosis not present

## 2018-06-13 DIAGNOSIS — D649 Anemia, unspecified: Secondary | ICD-10-CM | POA: Insufficient documentation

## 2018-06-13 DIAGNOSIS — R220 Localized swelling, mass and lump, head: Secondary | ICD-10-CM | POA: Diagnosis not present

## 2018-06-13 DIAGNOSIS — I10 Essential (primary) hypertension: Secondary | ICD-10-CM | POA: Diagnosis not present

## 2018-06-13 DIAGNOSIS — W19XXXA Unspecified fall, initial encounter: Secondary | ICD-10-CM

## 2018-06-13 DIAGNOSIS — Z87891 Personal history of nicotine dependence: Secondary | ICD-10-CM | POA: Insufficient documentation

## 2018-06-13 DIAGNOSIS — Y9289 Other specified places as the place of occurrence of the external cause: Secondary | ICD-10-CM | POA: Diagnosis not present

## 2018-06-13 DIAGNOSIS — Y998 Other external cause status: Secondary | ICD-10-CM | POA: Insufficient documentation

## 2018-06-13 DIAGNOSIS — R404 Transient alteration of awareness: Secondary | ICD-10-CM

## 2018-06-13 DIAGNOSIS — Y93K1 Activity, walking an animal: Secondary | ICD-10-CM | POA: Diagnosis not present

## 2018-06-13 DIAGNOSIS — R9431 Abnormal electrocardiogram [ECG] [EKG]: Secondary | ICD-10-CM | POA: Diagnosis not present

## 2018-06-13 DIAGNOSIS — S0993XA Unspecified injury of face, initial encounter: Secondary | ICD-10-CM | POA: Insufficient documentation

## 2018-06-13 DIAGNOSIS — Z79899 Other long term (current) drug therapy: Secondary | ICD-10-CM | POA: Insufficient documentation

## 2018-06-13 DIAGNOSIS — R51 Headache: Secondary | ICD-10-CM | POA: Diagnosis not present

## 2018-06-13 DIAGNOSIS — S0083XA Contusion of other part of head, initial encounter: Secondary | ICD-10-CM | POA: Diagnosis not present

## 2018-06-13 LAB — TROPONIN I: Troponin I: <0.03 ng/mL (ref <0.03)

## 2018-06-13 LAB — COMPREHENSIVE METABOLIC PANEL
ALT: 12 U/L (ref 0–44)
ANION GAP: 10 (ref 5–15)
AST: 21 U/L (ref 15–41)
Albumin: 4.2 g/dL (ref 3.5–5.0)
Alkaline Phosphatase: 62 U/L (ref 38–126)
BUN: 11 mg/dL (ref 8–23)
CHLORIDE: 106 mmol/L (ref 98–111)
CO2: 22 mmol/L (ref 22–32)
Calcium: 9.1 mg/dL (ref 8.9–10.3)
Creatinine, Ser: 0.79 mg/dL (ref 0.44–1.00)
GFR calc non Af Amer: >60 mL/min (ref >60)
GLUCOSE: 118 mg/dL — AB (ref 70–99)
POTASSIUM: 4.1 mmol/L (ref 3.5–5.1)
SODIUM: 138 mmol/L (ref 135–145)
Total Bilirubin: 0.8 mg/dL (ref 0.3–1.2)
Total Protein: 6.9 g/dL (ref 6.5–8.1)

## 2018-06-13 LAB — CBC WITH DIFFERENTIAL/PLATELET
Abs Immature Granulocytes: 0.03 10*3/uL (ref 0.00–0.07)
BASOS ABS: 0 10*3/uL (ref 0.0–0.1)
Basophils Relative: 0 %
Eosinophils Absolute: 0.1 10*3/uL (ref 0.0–0.5)
Eosinophils Relative: 1 %
HEMATOCRIT: 36.8 % (ref 36.0–46.0)
HEMOGLOBIN: 11.3 g/dL — AB (ref 12.0–15.0)
IMMATURE GRANULOCYTES: 0 %
LYMPHS ABS: 1.2 10*3/uL (ref 0.7–4.0)
LYMPHS PCT: 12 %
MCH: 26.4 pg (ref 26.0–34.0)
MCHC: 30.7 g/dL (ref 30.0–36.0)
MCV: 86 fL (ref 80.0–100.0)
Monocytes Absolute: 0.8 10*3/uL (ref 0.1–1.0)
Monocytes Relative: 9 %
NEUTROS PCT: 78 %
NRBC: 0 % (ref 0.0–0.2)
Neutro Abs: 7.2 10*3/uL (ref 1.7–7.7)
Platelets: 227 10*3/uL (ref 150–400)
RBC: 4.28 MIL/uL (ref 3.87–5.11)
RDW: 15.9 % — AB (ref 11.5–15.5)
WBC: 9.3 10*3/uL (ref 4.0–10.5)

## 2018-06-13 LAB — CBG MONITORING, ED: Glucose-Capillary: 105 mg/dL — ABNORMAL HIGH (ref 70–99)

## 2018-06-13 MED ORDER — SODIUM CHLORIDE 0.9 % IV SOLN
INTRAVENOUS | Status: DC
  Administered 2018-06-13: 15:00:00 via INTRAVENOUS

## 2018-06-13 NOTE — Progress Notes (Signed)
Chief Complaint  Patient presents with  . Loss of Consciousness    fall at home    HPI  Onset: 12:30pm History gathered by Coralie Common  Patient was seen down on the ground after she was unresponsive after a few minutes. She was found down on the concrete  Her neighbor was gardening and reports that when she did not hear Inez Catalina who was taking her [facility] for a walk  She called and the patient did not respond and she heard a thud and when she looked up from her garden the patient was face down on the concrete side walk. 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 Anemia   . Blood transfusion without reported diagnosis   . Diverticulosis of colon   . GERD (gastroesophageal reflux disease)   . Hearing loss    right ear, and tinnitus  . Hyperlipidemia   . Hypertension   . Tubular adenoma 03/05/2015   Polyp, and Benign lymphoid polyp.    Current Outpatient Medications  Medication Sig Dispense Refill  . aspirin EC 81 MG tablet Take 1 tablet (81 mg total) by mouth daily. 90 tablet 1  . atorvastatin (LIPITOR) 20 MG tablet TAKE 1 TABLET ONCE DAILY. 90 tablet 0  . doxycycline (VIBRAMYCIN) 100 MG capsule   0  . fluticasone (FLONASE) 50 MCG/ACT nasal spray Place 1 spray into both nostrils as needed for allergies or rhinitis.    Marland Kitchen HYDROcodone-acetaminophen (NORCO/VICODIN) 5-325 MG tablet Take by mouth.    . Hydrocortisone Micronized POWD USE 1 TEASPOONFUL (5ML) FOUR TIMES DAILY AS NEEDED FOR MOUTH PAIN AS DIRECTED. 150 g 0  . metoprolol succinate (TOPROL-XL) 25 MG 24 hr tablet Take 0.5 tablets  (12.5 mg total) by mouth daily. 45 tablet 3  . ramipril (ALTACE) 10 MG capsule TAKE (1) CAPSULE DAILY. 90 capsule 3  . VITAMIN D, CHOLECALCIFEROL, PO Take 5,000 Units by mouth daily.     . Wound Dressings (MEDIHONEY WOUND/BURN DRESSING) GEL      No current facility-administered medications for this visit.     Allergies:  Allergies  Allergen Reactions  . Macrobid [Nitrofurantoin Macrocrystal] Other (See Comments)    Fever, pain, fatigue, no appetite  . Norvasc [Amlodipine Besylate]     Palpitations, low bp  . Ceftin [Cefuroxime Axetil] Hives and Rash  . Ciprofloxacin Rash  . Penicillins Hives and Rash    Has patient had a PCN reaction causing immediate rash, facial/tongue/throat swelling, SOB or lightheadedness with hypotension: No Has patient had a PCN reaction causing severe rash involving mucus membranes or skin necrosis: Fayrene Fearing Has patient had a PCN reaction that required hospitalization No Has patient had a PCN reaction occurring within the last 10 years: No If all of the above answers are "NO", then may proceed with Cephalosporin use.   . Sulfonamide Derivatives Hives and Rash    Past Surgical History:  Procedure Laterality Date  . APPENDECTOMY    . BREAST BIOPSY     benign  . COCHLEAR IMPLANT    . CRANIECTOMY FOR EXCISION OF ACOUSTIC NEUROMA  2009  . NASAL SEPTUM SURGERY    . TONSILLECTOMY      Social History   Socioeconomic History  . Marital status: Divorced    Spouse name: Not on file  . Number of children: Not on file  . Years of education: Not on file  . Highest education level: Not on file  Occupational History  . Occupation: psychologist  Social Needs  . Financial resource strain: Not hard at all  . Food insecurity:    Worry: Never true    Inability: Never true  . Transportation needs:    Medical: No    Non-medical: No  Tobacco Use  . Smoking status: Former Smoker    Last attempt to quit: 07/04/1991    Years since quitting: 26.9  . Smokeless  tobacco: Never Used  Substance and Sexual Activity  . Alcohol use: Yes    Alcohol/week: 0.0 standard drinks    Comment: glass wine 4-5 days a week  . Drug use: No  . Sexual activity: Not Currently  Lifestyle  . Physical activity:    Days per week: 4 days    Minutes per session: 50 min  . Stress: Not at all  Relationships  . Social connections:    Talks on phone: More than three times a week    Gets together: More than three times a week    Attends religious service: More than 4 times per year    Active member of club or organization: Yes    Attends meetings of clubs or organizations: More than 4 times per year    Relationship status: Divorced  Other Topics Concern  . Not on file  Social History Narrative  . Not on file    Family History  Problem Relation Age of Onset  . Sudden death Father 45       MI  . Coronary artery disease Father   . Heart disease Father   . Dementia Mother 100  . Alcohol abuse Brother   . Colon cancer Neg Hx      ROS Review of Systems See HPI Constitution: No fevers or chills No malaise No diaphoresis Skin: No rash or itching Eyes: no blurry vision, no double vision GU: no dysuria or hematuria Neuro: no dizziness or headaches all others reviewed and negative   Objective: Vitals:   06/13/18 1327  BP: (!) 184/106  Pulse: (!) 116  Temp: 97.8 F (36.6 C)  TempSrc: Oral  SpO2: 99%    Physical Exam  Constitutional: She appears well-developed and well-nourished.  HENT:  Contusion of the right eye and face  Eyes: EOM are normal.  Hemorrhage of the right eye, unable to see the fundus, pupils equal but slow to react  Cardiovascular:  Sinus tachycardia  Pulmonary/Chest: Effort normal and breath sounds normal. No stridor. No respiratory distress. She has no wheezes.  Neurological: She is alert.  Oriented x 3 currently    Assessment and Plan [place] was seen today for loss of consciousness.  Diagnoses and all orders for this  visit:  Syncope and collapse -     EKG 12-Lead  Transient alteration of awareness -     EKG 12-Lead  ECG results ecg with sinus tachycardia with RSR' No ST elevation noted Compared ECG 2016 similar changes  Will transfer to the ER for syncope work up  Continued monitoring and further evaluation Pt transferred to the ER for further evaluation by [place]

## 2018-06-13 NOTE — ED Triage Notes (Addendum)
Per EMS, patient from [facility], reports fall today while walking dog at approximately 1200. Unknown LOC. Swelling and bruising to right side of face. Denies taking blood thinner.

## 2018-06-13 NOTE — Discharge Instructions (Addendum)
As discussed, it is normal to feel worse in the days immediately following a fall regardless of medication use. ° °However, please take all medication as directed, use ice packs liberally.  If you develop any new, or concerning changes in your condition, please return here for further evaluation and management.   ° °Otherwise, please return followup with your physician ° ° ° °

## 2018-06-13 NOTE — ED Notes (Signed)
Patient provided with ice pack

## 2018-06-13 NOTE — ED Notes (Signed)
Bed: WA07 Expected date:  Expected time:  Means of arrival:  Comments: EMS 77yo f syncope
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
## 2018-06-13 NOTE — ED Notes (Signed)
Patient transported to CT 

## 2018-06-18 ENCOUNTER — Other Ambulatory Visit: Payer: Self-pay | Admitting: Internal Medicine

## 2018-06-24 ENCOUNTER — Ambulatory Visit: Payer: Self-pay

## 2018-06-24 NOTE — Telephone Encounter (Signed)
ret'd call to pt.  Asking when she should receive her flu vaccine since she is scheduled for Cataract surgery on 11/19.  Questioned if she has had any reactions to flu vaccine in previous years?  Advised for effectiveness during the flu season, she should get the vaccine sooner vs. later.  Reported she has not had any problems with previous flu vaccines.  Stated she would like to schedule this within a couple days after the Cataract removal.  Scheduled appt. For flu vaccine 07/04/18 per pt. Request.  Agrees with plan.       Reason for Disposition . General information question, no triage required and triager able to answer question  Answer Assessment - Initial Assessment Questions 1. REASON FOR CALL or QUESTION: "What is your reason for calling today?" or "How can I best help you?" or "What question do you have that I can help answer?"     Request guidance on when to receive flu vaccine around Cataract surgery.  Protocols used: INFORMATION ONLY CALL-A-AH

## 2018-07-02 DIAGNOSIS — H25812 Combined forms of age-related cataract, left eye: Secondary | ICD-10-CM | POA: Diagnosis not present

## 2018-07-02 DIAGNOSIS — H2512 Age-related nuclear cataract, left eye: Secondary | ICD-10-CM | POA: Diagnosis not present

## 2018-07-04 ENCOUNTER — Ambulatory Visit (INDEPENDENT_AMBULATORY_CARE_PROVIDER_SITE_OTHER): Payer: Medicare Other | Admitting: *Deleted

## 2018-07-04 DIAGNOSIS — Z23 Encounter for immunization: Secondary | ICD-10-CM | POA: Diagnosis not present

## 2018-07-16 ENCOUNTER — Ambulatory Visit: Payer: Medicare Other | Admitting: Family Medicine

## 2018-07-25 ENCOUNTER — Other Ambulatory Visit: Payer: Self-pay | Admitting: Internal Medicine

## 2018-08-09 ENCOUNTER — Other Ambulatory Visit: Payer: Self-pay | Admitting: Internal Medicine

## 2018-08-30 ENCOUNTER — Other Ambulatory Visit: Payer: Self-pay

## 2018-08-30 ENCOUNTER — Other Ambulatory Visit (INDEPENDENT_AMBULATORY_CARE_PROVIDER_SITE_OTHER): Payer: Medicare Other

## 2018-08-30 DIAGNOSIS — E119 Type 2 diabetes mellitus without complications: Secondary | ICD-10-CM | POA: Diagnosis not present

## 2018-08-30 LAB — HEMOGLOBIN A1C: HEMOGLOBIN A1C: 6.1 % (ref 4.6–6.5)

## 2018-08-31 NOTE — Patient Instructions (Addendum)
Your a1c is 6.1%, which means your sugars are in the prediabetic range.     Medications reviewed and updated.  Changes include :   none     Please followup in 6 months
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
## 2018-09-02 ENCOUNTER — Ambulatory Visit (INDEPENDENT_AMBULATORY_CARE_PROVIDER_SITE_OTHER): Payer: Medicare Other | Admitting: Internal Medicine

## 2018-09-02 ENCOUNTER — Encounter: Payer: Self-pay | Admitting: Internal Medicine

## 2018-09-02 VITALS — BP 150/82 | HR 85 | Temp 98.4°F | Resp 14 | Ht 66.0 in | Wt 147.0 lb

## 2018-09-02 DIAGNOSIS — E785 Hyperlipidemia, unspecified: Secondary | ICD-10-CM | POA: Diagnosis not present

## 2018-09-02 DIAGNOSIS — E119 Type 2 diabetes mellitus without complications: Secondary | ICD-10-CM

## 2018-09-02 DIAGNOSIS — I10 Essential (primary) hypertension: Secondary | ICD-10-CM

## 2018-09-02 NOTE — Assessment & Plan Note (Signed)
Recent A1c improved and in prediabetic range Continue diabetic diet Continue regular exercise We will monitor A1c every 6 months

## 2018-09-02 NOTE — Assessment & Plan Note (Signed)
BP at home variable - usually 140/70 BP slightly elevated here - will just monitor continue current medications at current dose

## 2018-09-02 NOTE — Assessment & Plan Note (Signed)
Lipids have been well controlled with current medication-sure to give blood so we will not recheck today Continue atorvastatin 20 mg daily Continue regular exercise Follow-up in 6 months

## 2018-09-10 ENCOUNTER — Other Ambulatory Visit: Payer: Self-pay | Admitting: Internal Medicine

## 2018-09-17 DIAGNOSIS — H2511 Age-related nuclear cataract, right eye: Secondary | ICD-10-CM | POA: Diagnosis not present

## 2018-09-17 DIAGNOSIS — H25811 Combined forms of age-related cataract, right eye: Secondary | ICD-10-CM | POA: Diagnosis not present

## 2018-10-25 ENCOUNTER — Other Ambulatory Visit: Payer: Self-pay | Admitting: Internal Medicine

## 2018-11-02 ENCOUNTER — Other Ambulatory Visit: Payer: Self-pay | Admitting: Internal Medicine

## 2018-12-13 NOTE — Progress Notes (Addendum)
Subjective:   Emily Sweeney is a 78 y.o. female who presents for Medicare Annual (Subsequent) preventive examination.  I connected with patient 12/16/18 at  8:00 AM EDT by a video enabled telemedicine application and verified that I am speaking with the correct person using two identifiers. Patient stated full name and DOB. Patient gave permission to continue with virtual visit. Patient's location was at home and Nurse's location was at [place] office.   Review of Systems:  No ROS.  Medicare Wellness Virtual Visit. UTA vital signs.  Additional risk factors are reflected in the social history.  Cardiac Risk Factors include: advanced age (>76men, >48 women);diabetes mellitus;dyslipidemia;hypertension Sleep patterns: no sleep issues, feels rested on waking, gets up 0-1 times nightly to void and sleeps 7hours nightly.    Home Safety/Smoke Alarms: Feels safe in home. Smoke alarms in place.  Living environment; residence and Firearm Safety: 2-story house. Lives alone, no needs for DME, good support system Seat Belt Safety/Bike Helmet: Wears seat belt.     Objective:     Vitals: There were no vitals taken for this visit.  There is no height or weight on file to calculate BMI.  Advanced Directives 12/16/2018 06/13/2018 12/14/2017 12/07/2016 08/11/2016 06/13/2016 10/17/2015  Does Patient Have a Medical Advance Directive? Yes Yes No Yes Yes Yes Yes  Type of Advance Directive Living will Living will - [place];Living will [place];Living will [place];Living will [place];Living will  Does patient want to make changes to medical advance directive? - - - - - No - Patient declined No - Patient declined  Copy of [place] in Chart? - - - No - copy requested - No - copy requested Yes  Would patient like information on creating a medical advance directive? - - Yes (ED - Information included in AVS) - - - -    Tobacco Social History   Tobacco Use  Smoking Status Former Smoker  . Last attempt to quit: 07/04/1991  . Years since quitting: 27.4  Smokeless Tobacco Never Used     Counseling given: Not Answered  Past Medical History:  Diagnosis Date  . Anemia   . Blood transfusion without reported diagnosis   . Diverticulosis of colon   . GERD (gastroesophageal reflux disease)   . Hearing loss    right ear, and tinnitus  . Hyperlipidemia   . Hypertension   . Tubular adenoma 03/05/2015   Polyp, and Benign lymphoid polyp.   Past Surgical History:  Procedure Laterality Date  . APPENDECTOMY    . BREAST BIOPSY     benign  . CATARACT EXTRACTION, BILATERAL  2019, 2020  . COCHLEAR IMPLANT    . CRANIECTOMY FOR EXCISION OF ACOUSTIC NEUROMA  2009  . NASAL SEPTUM SURGERY    . TONSILLECTOMY     Family History  Problem Relation Age of Onset  . Sudden death Father 66       MI  . Coronary artery disease Father   . Heart disease Father   . Dementia Mother 91  . Alcohol abuse Brother   . Colon cancer Neg Hx    Social History   Socioeconomic History  . Marital status: Divorced    Spouse name: Not on file  . Number of children: Not on file  . Years of education: Not on file  . Highest education level: Not on file  Occupational History  . Occupation: psychologist  Social Needs  .  Financial resource strain: Not hard at all  . Food insecurity:    Worry: Never true    Inability: Never true  . Transportation needs:    Medical: No    Non-medical: No  Tobacco Use  . Smoking status: Former Smoker    Last attempt to quit: 07/04/1991    Years since quitting: 27.4  . Smokeless tobacco: Never Used  Substance and Sexual Activity  . Alcohol use: Yes    Alcohol/week: 0.0 standard drinks    Comment: glass wine 4-5 days a week  . Drug use: No  . Sexual activity: Not Currently  Lifestyle  . Physical activity:    Days per week: 6 days    Minutes per session: 60 min  . Stress: Not at all   Relationships  . Social connections:    Talks on phone: More than three times a week    Gets together: More than three times a week    Attends religious service: More than 4 times per year    Active member of club or organization: Yes    Attends meetings of clubs or organizations: More than 4 times per year    Relationship status: Divorced  Other Topics Concern  . Not on file  Social History Narrative  . Not on file    Outpatient Encounter Medications as of 12/16/2018  Medication Sig  . aspirin EC 81 MG tablet Take 1 tablet (81 mg total) by mouth daily.  Marland Kitchen atorvastatin (LIPITOR) 20 MG tablet TAKE 1 TABLET ONCE DAILY.  . fluticasone (FLONASE) 50 MCG/ACT nasal spray Place 1 spray into both nostrils as needed for allergies or rhinitis.  . metoprolol succinate (TOPROL-XL) 25 MG 24 hr tablet TAKE (1/2) TABLET DAILY.  . ramipril (ALTACE) 10 MG capsule TAKE (1) CAPSULE DAILY.  Marland Kitchen VITAMIN D, CHOLECALCIFEROL, PO Take 5,000 Units by mouth daily.    No facility-administered encounter medications on file as of 12/16/2018.     Activities of Daily Living In your present state of health, do you have any difficulty performing the following activities: 12/16/2018  Hearing? N  Vision? N  Difficulty concentrating or making decisions? N  Walking or climbing stairs? N  Dressing or bathing? N  Doing errands, shopping? N  Preparing Food and eating ? N  Using the Toilet? N  In the past six months, have you accidently leaked urine? N  Do you have problems with loss of bowel control? N  Managing your Medications? N  Managing your Finances? N  Housekeeping or managing your Housekeeping? N  Some recent data might be hidden    Patient Care Team: Binnie Rail, MD as PCP - General (Internal Medicine) Katy Apo, MD (Ophthalmology) Sanjuana Kava, MD as Referring Physician (Otolaryngology) Tat, Eustace Quail, DO as Consulting Physician (Neurology)    Assessment:   This is a routine wellness  examination for [place]. Physical assessment deferred to PCP.  Exercise Activities and Dietary recommendations Current Exercise Habits: Home exercise routine, Type of exercise: walking, Time (Minutes): 60, Frequency (Times/Week): 6, Weekly Exercise (Minutes/Week): 360, Intensity: Mild, Exercise limited by: None identified  Diet (meal preparation, eat out, water intake, caffeinated beverages, dairy products, fruits and vegetables): in general, a "healthy" diet  , well balanced eats a variety of fruits and vegetables daily, limits salt, fat/cholesterol, sugar,carbohydrates,caffeine, drinks 6-8 glasses of water daily.  Goals    . lose 5 pounds     Continue to walk, exercise, eat healthy.    Marland Kitchen  Patient Stated     Continue to be as healthy and as independent as possible. I will continue to walk and be physically active, enjoy life and family and love my dog.        Fall Risk Fall Risk  12/16/2018 09/02/2018 12/14/2017 12/07/2016 06/27/2016  Falls in the past year? 1 1 Yes Yes No  Number falls in past yr: 0 1 1 1  -  Injury with Fall? 1 1 Yes No -  Risk Factor Category  - - High Fall Risk - -  Risk for fall due to : Impaired balance/gait - Impaired mobility;Impaired balance/gait Impaired balance/gait;Impaired mobility -  Follow up - - Falls prevention discussed Falls prevention discussed;Education provided -    Depression Screen PHQ 2/9 Scores 12/16/2018 12/14/2017 12/07/2016 01/19/2016  PHQ - 2 Score 0 0 0 0  PHQ- 9 Score - 0 0 -     Cognitive Function MMSE - Mini Mental State Exam 12/14/2017  Not completed: Refused       Ad8 score reviewed for issues:  Issues making decisions: no  Less interest in hobbies / activities: no  Repeats questions, stories (family complaining): no  Trouble using ordinary gadgets (microwave, computer, phone):no  Forgets the month or year: no  Mismanaging finances: no  Remembering appts: no  Daily problems with thinking and/or memory: no Ad8 score is= 0   Immunization History  Administered Date(s) Administered  . Influenza Split 07/04/2011, 09/15/2011  . Influenza Whole 05/14/2008, 06/10/2010  . Influenza, High Dose Seasonal PF 06/18/2013, 05/02/2016, 05/25/2017, 07/04/2018  . Influenza,inj,Quad PF,6+ Mos 09/23/2014, 06/09/2015  . Pneumococcal Conjugate-13 10/16/2014  . Pneumococcal Polysaccharide-23 10/21/2008, 10/14/2015  . Td 11/13/2006  . Tdap 05/25/2017  . Zoster 12/28/2006   Screening Tests Health Maintenance  Topic Date Due  . FOOT EXAM  12/15/2018  . HEMOGLOBIN A1C  02/28/2019  . INFLUENZA VACCINE  03/15/2019  . OPHTHALMOLOGY EXAM  06/01/2019  . DEXA SCAN  12/18/2019  . COLONOSCOPY  03/07/2020  . TETANUS/TDAP  05/26/2027  . PNA vac Low Risk Adult  Completed      Plan:     Reviewed health maintenance screenings with patient today and relevant education, vaccines, and/or referrals were provided.   Continue doing brain stimulating activities (puzzles, reading, adult coloring books, staying active) to keep memory sharp.   Continue to eat heart healthy diet (full of fruits, vegetables, whole grains, lean protein, water--limit salt, fat, and sugar intake) and increase physical activity as tolerated.  I have personally reviewed and noted the following in the patient's chart:   . Medical and social history . Use of alcohol, tobacco or illicit drugs  . Current medications and supplements . Functional ability and status . Nutritional status . Physical activity . Advanced directives . List of other physicians . Screenings to include cognitive, depression, and falls . Referrals and appointments  In addition, I have reviewed and discussed with patient certain preventive protocols, quality metrics, and best practice recommendations. A written personalized care plan for preventive services as well as general preventive health recommendations were provided to patient.     Michiel Cowboy, RN  12/16/2018    Medical screening  examination/treatment/procedure(s) were performed by non-physician practitioner and as supervising physician I was immediately available for consultation/collaboration. I agree with above. Binnie Rail, MD

## 2018-12-16 ENCOUNTER — Ambulatory Visit (INDEPENDENT_AMBULATORY_CARE_PROVIDER_SITE_OTHER): Payer: Medicare Other | Admitting: *Deleted

## 2018-12-16 DIAGNOSIS — Z0000 Encounter for general adult medical examination without abnormal findings: Secondary | ICD-10-CM

## 2018-12-16 NOTE — Patient Instructions (Signed)
Continue doing brain stimulating activities (puzzles, reading, adult coloring books, staying active) to keep memory sharp.   Continue to eat heart healthy diet (full of fruits, vegetables, whole grains, lean protein, water--limit salt, fat, and sugar intake) and increase physical activity as tolerated.   Emily Sweeney , Thank you for taking time to come for your Medicare Wellness Visit. I appreciate your ongoing commitment to your health goals. Please review the following plan we discussed and let me know if I can assist you in the future.   These are the goals we discussed: Goals    . Patient Stated     Maintain my current health status by continuing to eat healthy and stay physically and socially active.       This is a list of the screening recommended for you and due dates:  Health Maintenance  Topic Date Due  . Complete foot exam   12/15/2018  . Hemoglobin A1C  02/28/2019  . Flu Shot  03/15/2019  . Eye exam for diabetics  06/01/2019  . DEXA scan (bone density measurement)  12/18/2019  . Colon Cancer Screening  03/07/2020  . Tetanus Vaccine  05/26/2027  . Pneumonia vaccines  Completed    Preventive Care 28 Years and Older, Female Preventive care refers to lifestyle choices and visits with your health care provider that can promote health and wellness. What does preventive care include?  A yearly physical exam. This is also called an annual well check.  Dental exams once or twice a year.  Routine eye exams. Ask your health care provider how often you should have your eyes checked.  Personal lifestyle choices, including: ? Daily care of your teeth and gums. ? Regular physical activity. ? Eating a healthy diet. ? Avoiding tobacco and drug use. ? Limiting alcohol use. ? Practicing safe sex. ? Taking low-dose aspirin every day. ? Taking vitamin and mineral supplements as recommended by your health care provider. What happens during an annual well check? The services and  screenings done by your health care provider during your annual well check will depend on your age, overall health, lifestyle risk factors, and family history of disease. Counseling Your health care provider may ask you questions about your:  Alcohol use.  Tobacco use.  Drug use.  Emotional well-being.  Home and relationship well-being.  Sexual activity.  Eating habits.  History of falls.  Memory and ability to understand (cognition).  Work and work Statistician.  Reproductive health.  Screening You may have the following tests or measurements:  Height, weight, and BMI.  Blood pressure.  Lipid and cholesterol levels. These may be checked every 5 years, or more frequently if you are over 45 years old.  Skin check.  Lung cancer screening. You may have this screening every year starting at age 48 if you have a 30-pack-year history of smoking and currently smoke or have quit within the past 15 years.  Colorectal cancer screening. All adults should have this screening starting at age 7 and continuing until age 5. You will have tests every 1-10 years, depending on your results and the type of screening test. People at increased risk should start screening at an earlier age. Screening tests may include: ? Guaiac-based fecal occult blood testing. ? Fecal immunochemical test (FIT). ? Stool DNA test. ? Virtual colonoscopy. ? Sigmoidoscopy. During this test, a flexible tube with a tiny camera (sigmoidoscope) is used to examine your rectum and lower colon. The sigmoidoscope is inserted through your anus into  your rectum and lower colon. ? Colonoscopy. During this test, a long, thin, flexible tube with a tiny camera (colonoscope) is used to examine your entire colon and rectum.  Hepatitis C blood test.  Hepatitis B blood test.  Sexually transmitted disease (STD) testing.  Diabetes screening. This is done by checking your blood sugar (glucose) after you have not eaten for a  while (fasting). You may have this done every 1-3 years.  Bone density scan. This is done to screen for osteoporosis. You may have this done starting at age 27.  Mammogram. This may be done every 1-2 years. Talk to your health care provider about how often you should have regular mammograms. Talk with your health care provider about your test results, treatment options, and if necessary, the need for more tests. Vaccines Your health care provider may recommend certain vaccines, such as:  Influenza vaccine. This is recommended every year.  Tetanus, diphtheria, and acellular pertussis (Tdap, Td) vaccine. You may need a Td booster every 10 years.  Varicella vaccine. You may need this if you have not been vaccinated.  Zoster vaccine. You may need this after age 10.  Measles, mumps, and rubella (MMR) vaccine. You may need at least one dose of MMR if you were born in 1957 or later. You may also need a second dose.  Pneumococcal 13-valent conjugate (PCV13) vaccine. One dose is recommended after age 44.  Pneumococcal polysaccharide (PPSV23) vaccine. One dose is recommended after age 74.  Meningococcal vaccine. You may need this if you have certain conditions.  Hepatitis A vaccine. You may need this if you have certain conditions or if you travel or work in places where you may be exposed to hepatitis A.  Hepatitis B vaccine. You may need this if you have certain conditions or if you travel or work in places where you may be exposed to hepatitis B.  Haemophilus influenzae type b (Hib) vaccine. You may need this if you have certain conditions. Talk to your health care provider about which screenings and vaccines you need and how often you need them. This information is not intended to replace advice given to you by your health care provider. Make sure you discuss any questions you have with your health care provider. Document Released: 08/27/2015 Document Revised: 09/20/2017 Document Reviewed:  06/01/2015 Elsevier Interactive Patient Education  2019 [employer].

## 2019-03-02 NOTE — Progress Notes (Signed)
Virtual Visit via Video Note  I connected with Emily Sweeney on 03/03/19 at  8:45 AM EDT by a video enabled telemedicine application and verified that I am speaking with the correct person using two identifiers.   I discussed the limitations of evaluation and management by telemedicine and the availability of in person appointments. The patient expressed understanding and agreed to proceed.  The patient is currently at home and I am in the office.    No referring provider.    History of Present Illness: She is here for follow up of her chronic medical conditions.   She is exercising regularly.    Diabetes: She is taking her medication daily as prescribed. She is compliant with a diabetic diet.   She denies numbness/tingling in her feet and foot lesions. She is up-to-date with an ophthalmology examination.    Hypertension: She is taking her medication daily. She is compliant with a low sodium diet.  She denies chest pain, palpitations, edema, shortness of breath and regular headaches. She does not monitor her blood pressure at home.    Hyperlipidemia: She is taking her medication daily. She is compliant with a low fat/cholesterol diet. She denies myalgias.     Review of Systems  Constitutional: Negative for chills and fever.  Respiratory: Negative for cough, shortness of breath and wheezing.   Cardiovascular: Negative for chest pain, palpitations and leg swelling.  Neurological: Negative for dizziness and headaches.     Social History   Socioeconomic History  . Marital status: Divorced    Spouse name: Not on file  . Number of children: Not on file  . Years of education: Not on file  . Highest education level: Not on file  Occupational History  . Occupation: psychologist  Social Needs  . Financial resource strain: Not hard at all  . Food insecurity    Worry: Never true    Inability: Never true  . Transportation needs    Medical: No    Non-medical: No  Tobacco Use   . Smoking status: Former Smoker    Quit date: 07/04/1991    Years since quitting: 27.6  . Smokeless tobacco: Never Used  Substance and Sexual Activity  . Alcohol use: Yes    Alcohol/week: 0.0 standard drinks    Comment: glass wine 4-5 days a week  . Drug use: No  . Sexual activity: Not Currently  Lifestyle  . Physical activity    Days per week: 6 days    Minutes per session: 60 min  . Stress: Not at all  Relationships  . Social connections    Talks on phone: More than three times a week    Gets together: More than three times a week    Attends religious service: More than 4 times per year    Active member of club or organization: Yes    Attends meetings of clubs or organizations: More than 4 times per year    Relationship status: Divorced  Other Topics Concern  . Not on file  Social History Narrative  . Not on file     Observations/Objective: Appears well in NAD  BP 126/84  Pulse 86  Assessment and Plan:  See Problem List for Assessment and Plan of chronic medical problems.   Follow Up Instructions:    I discussed the assessment and treatment plan with the patient. The patient was provided an opportunity to ask questions and all were answered. The patient agreed with the plan and demonstrated an understanding  of the instructions.   The patient was advised to call back or seek an in-person evaluation if the symptoms worsen or if the condition fails to improve as anticipated.  FU in 6 months.  Will defer blood work  Binnie Rail, MD

## 2019-03-03 ENCOUNTER — Encounter: Payer: Self-pay | Admitting: Internal Medicine

## 2019-03-03 ENCOUNTER — Ambulatory Visit (INDEPENDENT_AMBULATORY_CARE_PROVIDER_SITE_OTHER): Payer: Medicare Other | Admitting: Internal Medicine

## 2019-03-03 DIAGNOSIS — E119 Type 2 diabetes mellitus without complications: Secondary | ICD-10-CM | POA: Diagnosis not present

## 2019-03-03 DIAGNOSIS — I10 Essential (primary) hypertension: Secondary | ICD-10-CM | POA: Diagnosis not present

## 2019-03-03 DIAGNOSIS — E785 Hyperlipidemia, unspecified: Secondary | ICD-10-CM

## 2019-03-03 NOTE — Assessment & Plan Note (Signed)
Lab Results  Component Value Date   HGBA1C 6.1 08/30/2018   Has been well controlled with lifestyle Eating well and exercising Will defer labs due to COVID-19 Follow up in 6 months

## 2019-03-03 NOTE — Assessment & Plan Note (Signed)
BP at home good BP well controlled Current regimen effective and well tolerated Continue current medications at current doses

## 2019-03-03 NOTE — Assessment & Plan Note (Signed)
Last lipid panel well controlled Continue statin

## 2019-03-14 ENCOUNTER — Other Ambulatory Visit: Payer: Self-pay | Admitting: Internal Medicine

## 2019-05-04 ENCOUNTER — Other Ambulatory Visit: Payer: Self-pay | Admitting: Internal Medicine

## 2019-06-06 ENCOUNTER — Ambulatory Visit (INDEPENDENT_AMBULATORY_CARE_PROVIDER_SITE_OTHER): Payer: Medicare Other

## 2019-06-06 DIAGNOSIS — Z23 Encounter for immunization: Secondary | ICD-10-CM

## 2019-07-24 ENCOUNTER — Other Ambulatory Visit: Payer: Self-pay | Admitting: Internal Medicine

## 2019-08-04 ENCOUNTER — Other Ambulatory Visit: Payer: Self-pay | Admitting: Internal Medicine

## 2019-08-23 NOTE — Progress Notes (Signed)
Virtual Visit via Video Note  I connected with Emily Sweeney on 08/25/19 at  8:15 AM EST by a video enabled telemedicine application and verified that I am speaking with the correct person using two identifiers.   I discussed the limitations of evaluation and management by telemedicine and the availability of in person appointments. The patient expressed understanding and agreed to proceed.  Present for the visit:  Myself, Dr Billey Gosling, Emily Sweeney.  The patient is currently at home and I am in the office.    No referring provider.    History of Present Illness: She is here for follow up of her chronic medical conditions.    She is exercising regularly - walking her dog.    Diabetes: She is taking her medication daily as prescribed. She is compliant with a diabetic diet.   She denies numbness/tingling in her feet. She is not up-to-date with an ophthalmology examination.   Hypertension: She is taking her medication daily. She is compliant with a low sodium diet.  She denies chest pain, palpitations, edema, shortness of breath and regular headaches. She does monitor her blood pressure at home - 121/76  Hr of 80.    Hyperlipidemia: She is taking her medication daily. She is compliant with a low fat/cholesterol diet.      Review of Systems  Constitutional: Negative for fever.  Eyes: Negative for blurred vision.  Respiratory: Positive for cough (PND related). Negative for shortness of breath and wheezing.   Cardiovascular: Negative for chest pain, palpitations and leg swelling.  Neurological: Negative for dizziness, tingling and headaches.     Social History   Socioeconomic History  . Marital status: Divorced    Spouse name: Not on file  . Number of children: Not on file  . Years of education: Not on file  . Highest education level: Not on file  Occupational History  . Occupation: psychologist  Tobacco Use  . Smoking status: Former Smoker    Quit date: 07/04/1991   Years since quitting: 28.1  . Smokeless tobacco: Never Used  Substance and Sexual Activity  . Alcohol use: Yes    Alcohol/week: 0.0 standard drinks    Comment: glass wine 4-5 days a week  . Drug use: No  . Sexual activity: Not Currently  Other Topics Concern  . Not on file  Social History Narrative  . Not on file   Social Determinants of Health   Financial Resource Strain:   . Difficulty of Paying Living Expenses: Not on file  Food Insecurity:   . Worried About Charity fundraiser in the Last Year: Not on file  . Ran Out of Food in the Last Year: Not on file  Transportation Needs:   . Lack of Transportation (Medical): Not on file  . Lack of Transportation (Non-Medical): Not on file  Physical Activity: Sufficiently Active  . Days of Exercise per Week: 6 days  . Minutes of Exercise per Session: 60 min  Stress:   . Feeling of Stress : Not on file  Social Connections:   . Frequency of Communication with Friends and Family: Not on file  . Frequency of Social Gatherings with Friends and Family: Not on file  . Attends Religious Services: Not on file  . Active Member of Clubs or Organizations: Not on file  . Attends Archivist Meetings: Not on file  . Marital Status: Not on file     Observations/Objective: Appears well in NAD Breathing normally Skin appear warm  and dry Normal mood and affect  Assessment and Plan:  See Problem List for Assessment and Plan of chronic medical problems.   Follow Up Instructions:    I discussed the assessment and treatment plan with the patient. The patient was provided an opportunity to ask questions and all were answered. The patient agreed with the plan and demonstrated an understanding of the instructions.   The patient was advised to call back or seek an in-person evaluation if the symptoms worsen or if the condition fails to improve as anticipated.  FU in 6 months   Binnie Rail, MD

## 2019-08-25 ENCOUNTER — Encounter: Payer: Self-pay | Admitting: Internal Medicine

## 2019-08-25 ENCOUNTER — Ambulatory Visit (INDEPENDENT_AMBULATORY_CARE_PROVIDER_SITE_OTHER): Payer: Medicare Other | Admitting: Internal Medicine

## 2019-08-25 DIAGNOSIS — E785 Hyperlipidemia, unspecified: Secondary | ICD-10-CM

## 2019-08-25 DIAGNOSIS — I10 Essential (primary) hypertension: Secondary | ICD-10-CM | POA: Diagnosis not present

## 2019-08-25 DIAGNOSIS — E119 Type 2 diabetes mellitus without complications: Secondary | ICD-10-CM

## 2019-08-25 NOTE — Assessment & Plan Note (Signed)
Chronic Diet controlled Last a1c one year ago 6.1% Continue diabetic diet and encouraged regular exercise Check a1c at next visit

## 2019-08-25 NOTE — Assessment & Plan Note (Signed)
Chronic Continue daily statin Regular exercise and healthy diet encouraged  

## 2019-08-25 NOTE — Assessment & Plan Note (Signed)
Chronic BP well controlled at home Current regimen effective and well tolerated Continue current medications at current doses   

## 2019-08-27 ENCOUNTER — Ambulatory Visit: Payer: Medicare Other | Attending: Internal Medicine

## 2019-08-27 DIAGNOSIS — Z23 Encounter for immunization: Secondary | ICD-10-CM | POA: Diagnosis not present

## 2019-08-27 NOTE — Progress Notes (Signed)
   Covid-19 Vaccination Clinic  Name:  Emily Sweeney    MRN: [id number] DOB: 08-07-1941  08/27/2019  [name] was observed post Covid-19 immunization for 30 minutes based on pre-vaccination screening without incidence. She was provided with Vaccine Information Sheet and instruction to access the V-Safe system.   [name] was instructed to call 911 with any severe reactions post vaccine: Marland Kitchen Difficulty breathing  . Swelling of your face and throat  . A fast heartbeat  . A bad rash all over your body  . Dizziness and weakness    Immunizations Administered    Name Date Dose VIS Date Route   Pfizer COVID-19 Vaccine 08/27/2019  9:05 AM 0.3 mL 07/25/2019 Intramuscular   Manufacturer: [employer], [employer]   Lot: [id number]   [place]: [id number]

## 2019-09-10 ENCOUNTER — Other Ambulatory Visit: Payer: Self-pay | Admitting: Internal Medicine

## 2019-09-16 ENCOUNTER — Ambulatory Visit: Payer: Medicare Other | Attending: Internal Medicine

## 2019-09-16 DIAGNOSIS — Z23 Encounter for immunization: Secondary | ICD-10-CM

## 2019-09-16 NOTE — Progress Notes (Signed)
   Covid-19 Vaccination Clinic  Name:  Emily Sweeney    MRN: [id number] DOB: Apr 19, 1941  09/16/2019  [name] was observed post Covid-19 immunization for 30 minutes based on pre-vaccination screening without incidence. She was provided with Vaccine Information Sheet and instruction to access the V-Safe system.   [name] was instructed to call 911 with any severe reactions post vaccine: Marland Kitchen Difficulty breathing  . Swelling of your face and throat  . A fast heartbeat  . A bad rash all over your body  . Dizziness and weakness    Immunizations Administered    Name Date Dose VIS Date Route   Pfizer COVID-19 Vaccine 09/16/2019  8:15 AM 0.3 mL 07/25/2019 Intramuscular   Manufacturer: [place]   Lot: [id number]   [place]: [id number]

## 2019-09-20 IMAGING — CT CT MAXILLOFACIAL W/O CM
4 of 6 series · 16 of 47 positions shown, 18 images · non-contrast
Comparison: 02/03/2017

CLINICAL DATA: Fall while walking dog with headaches and facial
pain, initial encounter

EXAM:
CT HEAD WITHOUT CONTRAST
CT MAXILLOFACIAL WITHOUT CONTRAST
TECHNIQUE: Multidetector CT imaging of the head and maxillofacial structures
were performed using the standard protocol without intravenous
contrast. Multiplanar CT image reconstructions of the maxillofacial
structures were also generated.

[Series 3: head wo · axial · 0.47mm/px · z∈[-29,+86]mm · 7 of 31 slices shown, 9 images]
[im 4/31  brain]
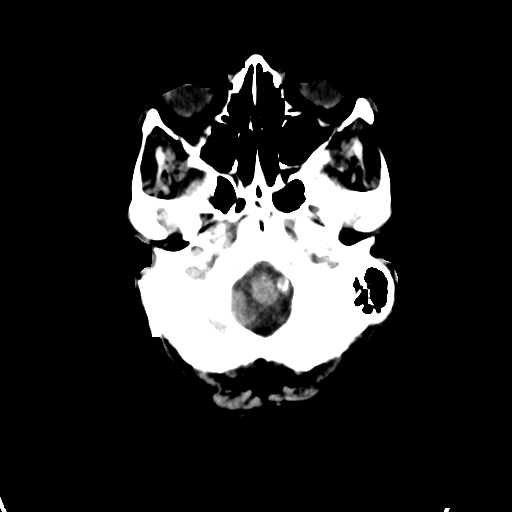
[im 4/31  bone]
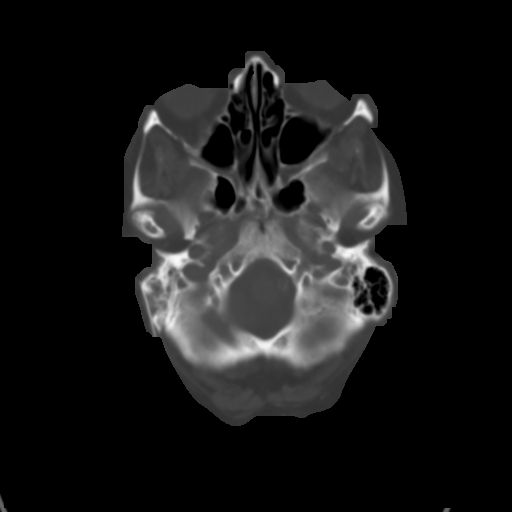
[im 8/31  bone]
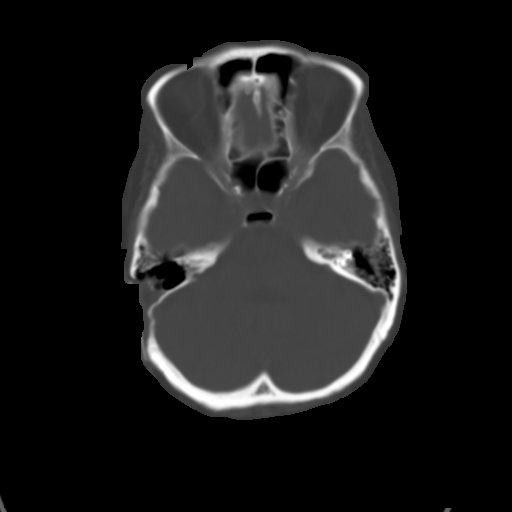
[im 12/31  bone]
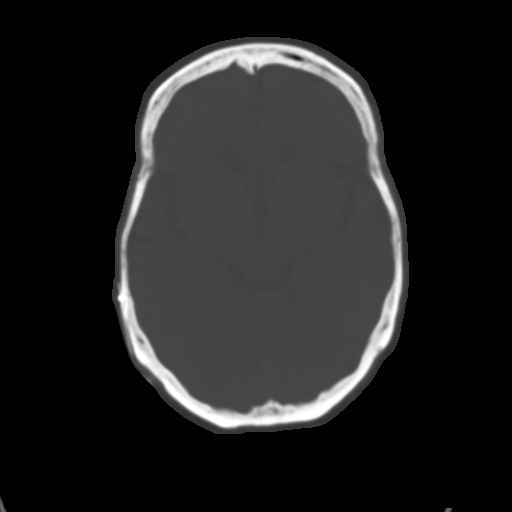
[im 16/31  bone]
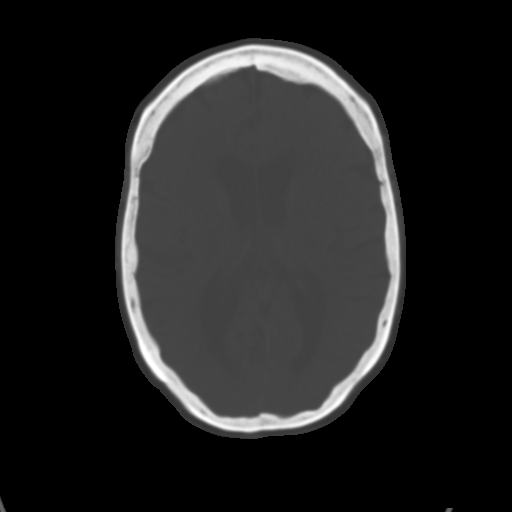
[im 19/31  brain]
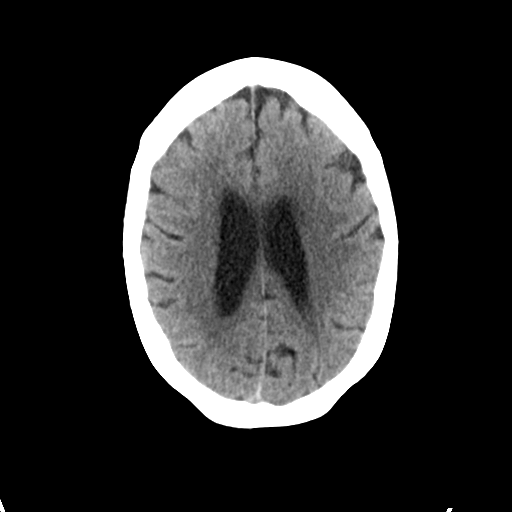
[im 19/31  bone]
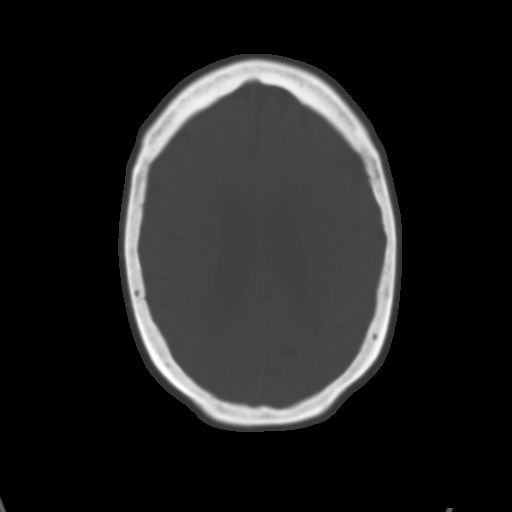
[im 23/31  bone]
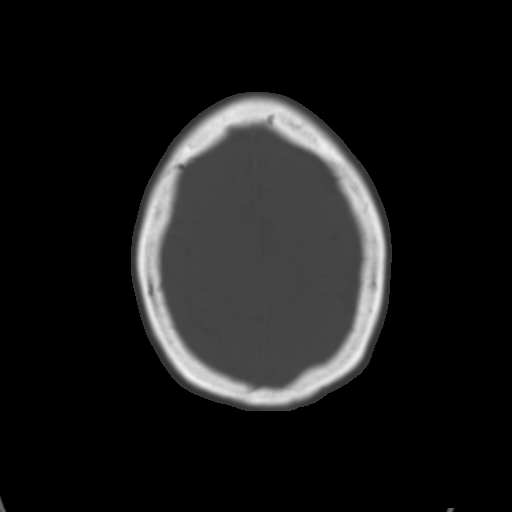
[im 27/31  bone]
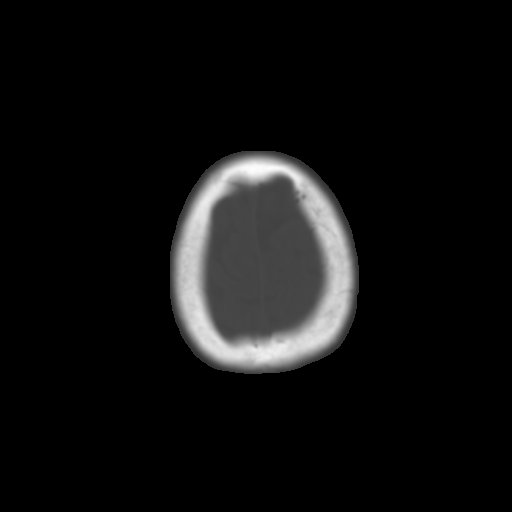

[Series 6: coronal soft tissue · coronal · 0.29mm/px · 3 of 67 slices shown]
[im 16/67  bone]
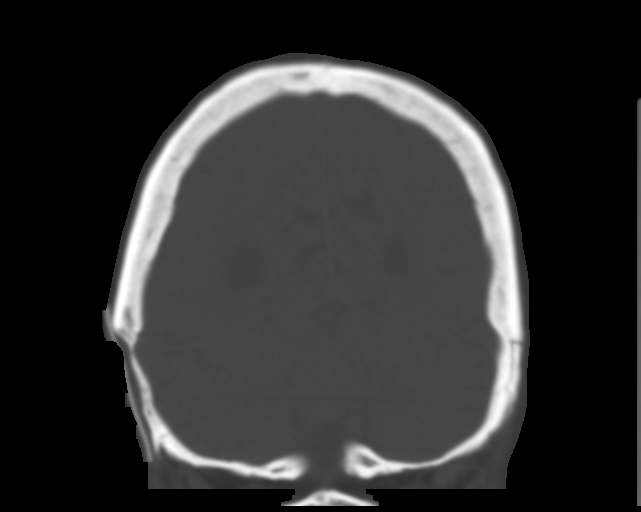
[im 31/67  bone]
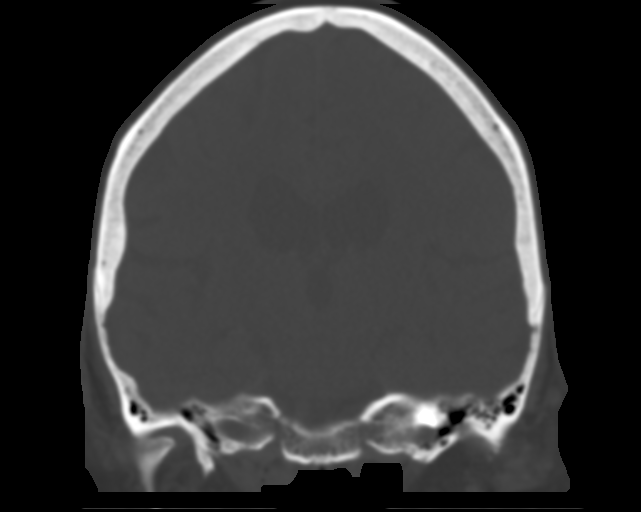
[im 46/67  bone]
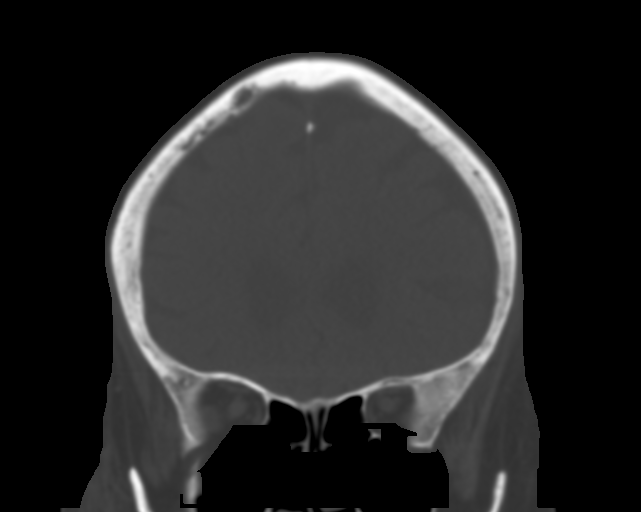

[Series 8: max soft · axial · 0.33mm/px · z∈[-123,-71]mm · 4 of 75 slices shown]
[im 8/75  brain]
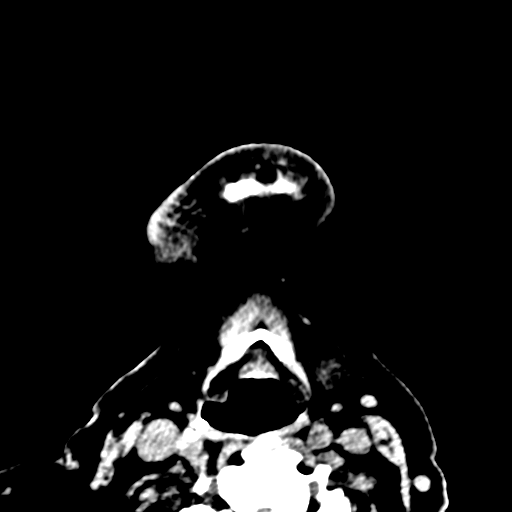
[im 15/75  brain]
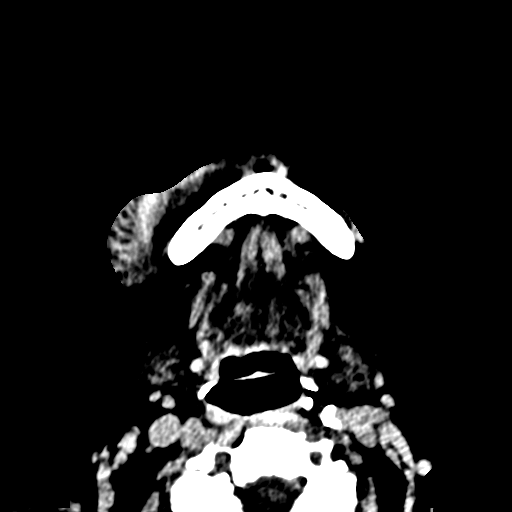
[im 23/75  brain]
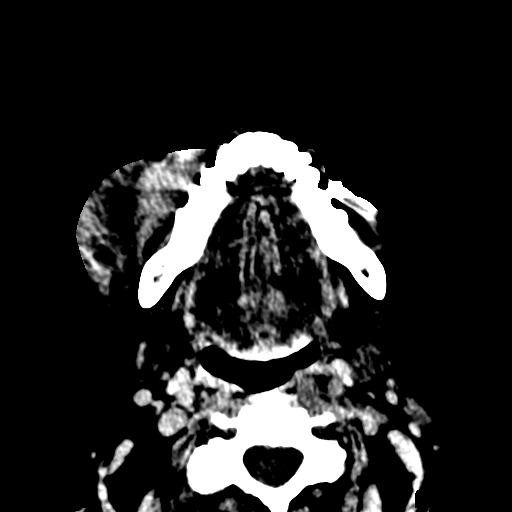
[im 34/75  brain]
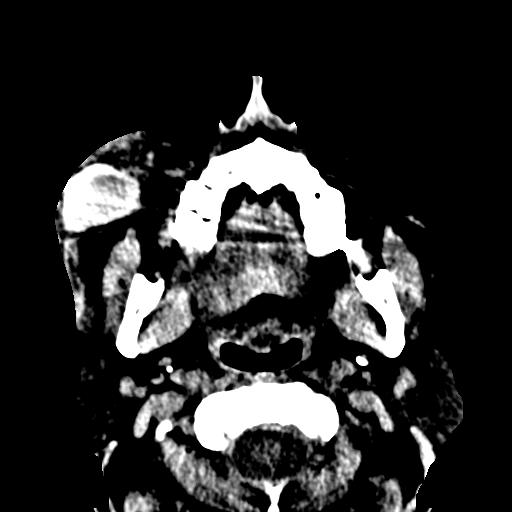

[Series 14: sagittal soft · sagittal · 0.35mm/px · 2 of 86 slices shown]
[im 29/86  bone]
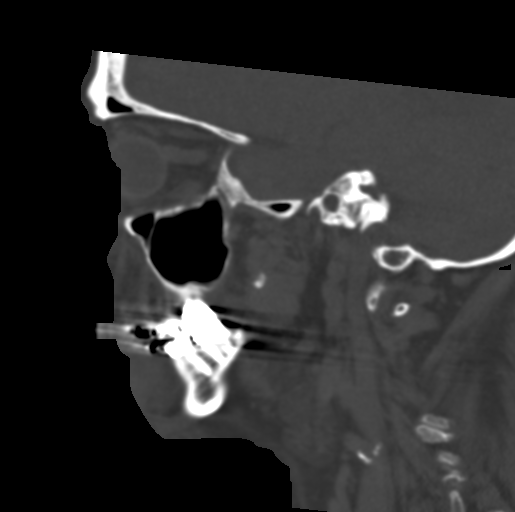
[im 57/86  bone]
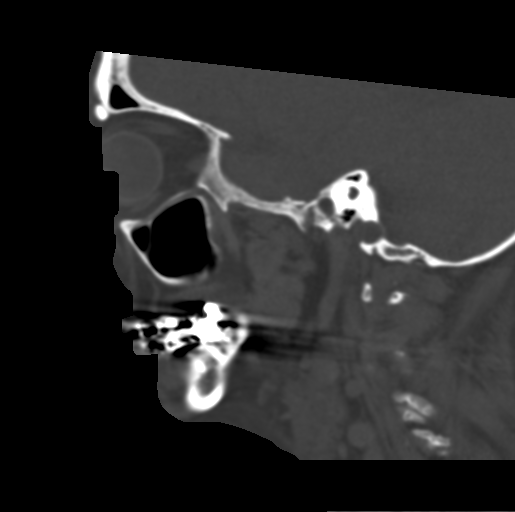

[16 of 47 positions shown; findings below may reference images not displayed]

FINDINGS: CT HEAD FINDINGS

Brain: Mild atrophic changes are noted. No findings to suggest acute
hemorrhage, acute infarction or space-occupying mass lesion noted.
Mild white matter ischemic changes are seen.

Vascular: No hyperdense vessel or unexpected calcification.

Skull: Skull is intact. Postsurgical changes are noted in the right
mastoid aerated air cell stable from the prior exam.

Other: None

CT MAXILLOFACIAL FINDINGS

Osse[place]: Postsurgical changes are again noted in the right mastoid
air cells. Degenerative changes of the visualized cervical spine are
seen. No acute fracture is identified. Degenerative changes of the
temporomandibular joints are seen.

Orbits: Orbits and their contents are within normal limits.

Sinuses: Paranasal sinuses are well aerated without air-fluid level
or mucosal abnormality.

Soft tissues: Surrounding soft tissues demonstrate considerable
edema on the right side of the face consistent with the recent
injury. This extends from just below the jaw line superiorly to
level of the lateral aspect of the right orbit. A focal hematoma is
noted in the right cheek measuring 2.5 x 2.2 cm in greatest
dimension. No other soft tissue abnormality is noted.
IMPRESSION: CT of the head: Postsurgical changes in the right mastoid air cells.

Chronic changes as described.

CT of the maxillofacial bones: No acute fracture is noted.

Soft tissue swelling in the right face with focal hematoma as
described.

## 2019-09-20 IMAGING — CR DG CHEST 2V
2 series · 2 of 2 positions shown · non-contrast
Comparison: None.

CLINICAL DATA: Syncope

EXAM:
CHEST - 2 VIEW

[x chest ap]
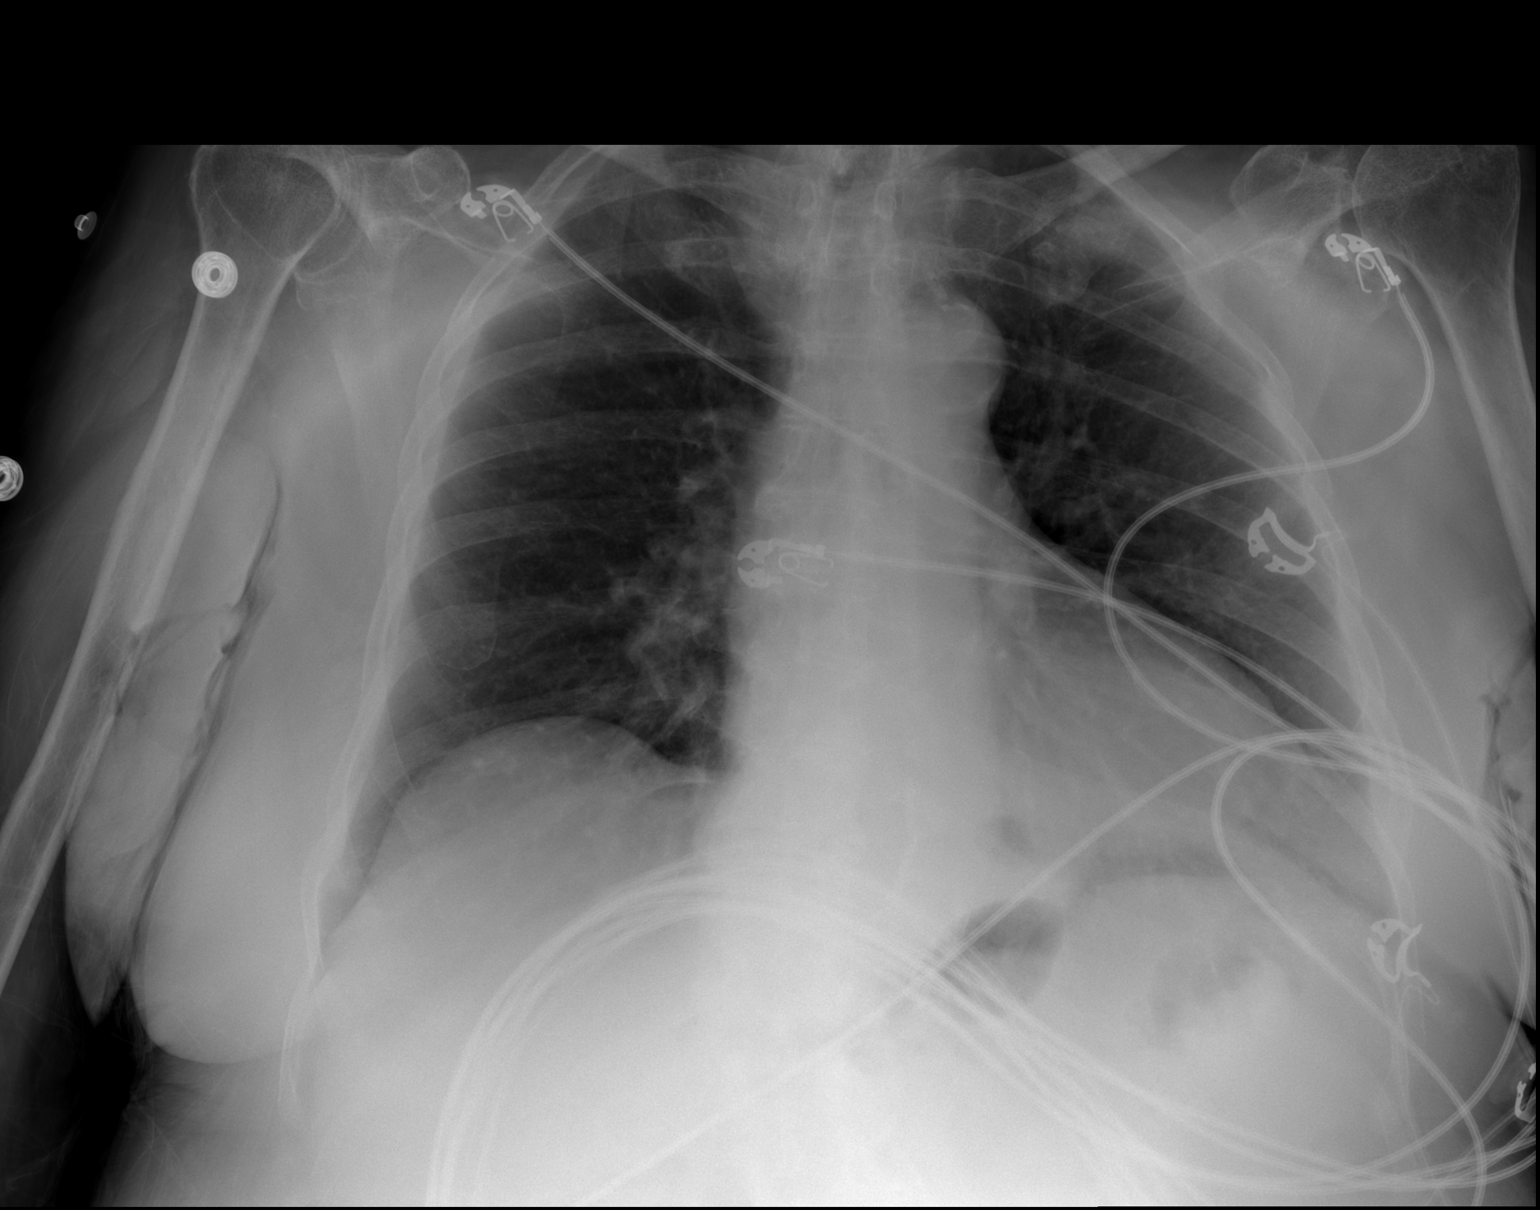

[w chest lat]
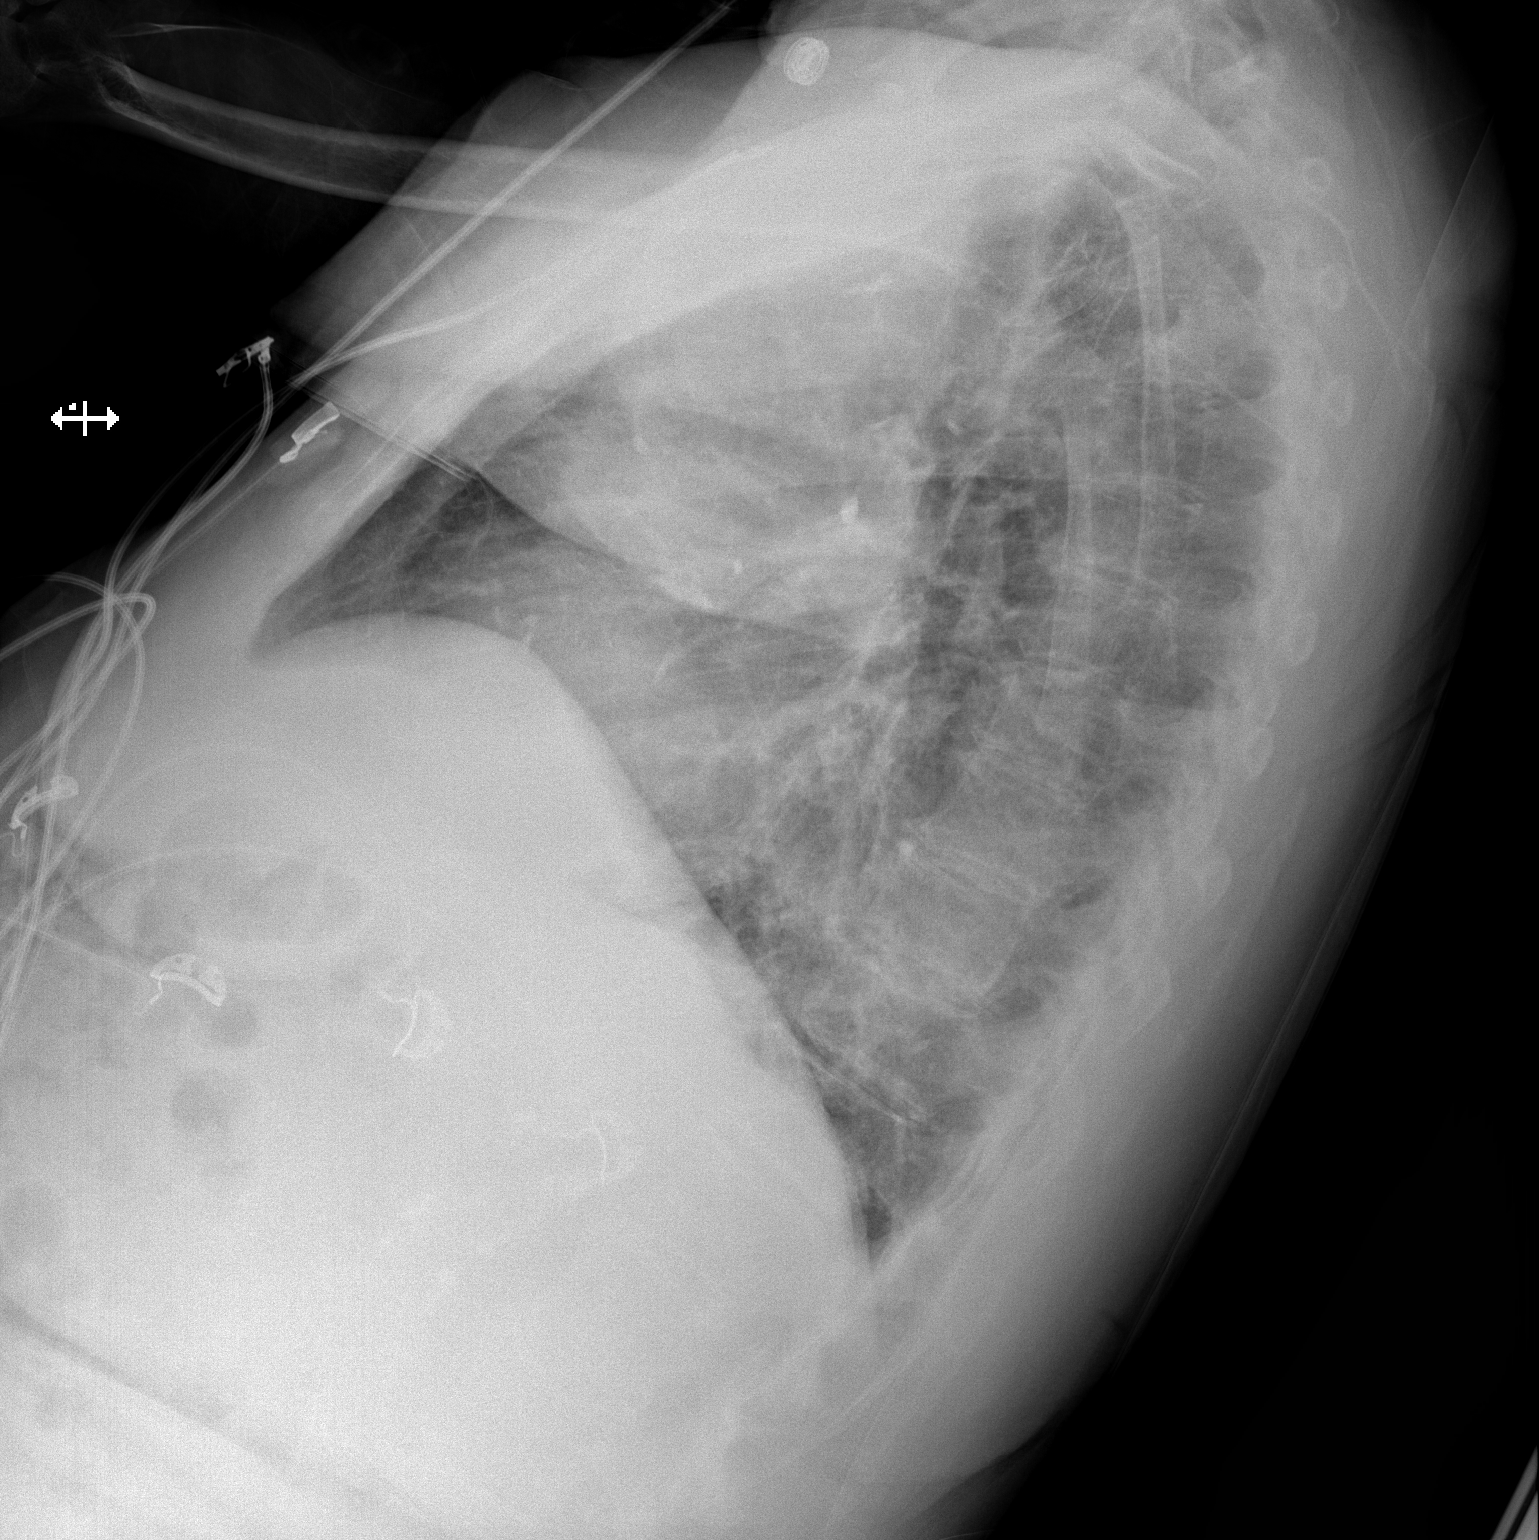

[2 of 2 positions shown; findings below may reference images not displayed]

FINDINGS: The heart size and mediastinal contours are within normal limits.
Both lungs are clear. The visualized skeletal structures are
unremarkable.
IMPRESSION: No active cardiopulmonary disease.

## 2019-10-23 ENCOUNTER — Other Ambulatory Visit: Payer: Self-pay | Admitting: Internal Medicine

## 2019-11-05 ENCOUNTER — Other Ambulatory Visit: Payer: Self-pay | Admitting: Internal Medicine

## 2019-11-06 ENCOUNTER — Other Ambulatory Visit: Payer: Self-pay | Admitting: Otolaryngology

## 2019-11-06 DIAGNOSIS — D333 Benign neoplasm of cranial nerves: Secondary | ICD-10-CM

## 2019-12-10 ENCOUNTER — Ambulatory Visit
Admission: RE | Admit: 2019-12-10 | Discharge: 2019-12-10 | Disposition: A | Discharge status: Home / Self Care | Payer: Medicare Other | Source: Ambulatory Visit | Attending: Otolaryngology | Admitting: Otolaryngology

## 2019-12-10 DIAGNOSIS — D333 Benign neoplasm of cranial nerves: Secondary | ICD-10-CM

## 2019-12-10 MED ORDER — GADOBENATE DIMEGLUMINE 529 MG/ML IV SOLN
14.0000 mL | Freq: Once | INTRAVENOUS | Status: AC | PRN
  Administered 2019-12-10: 14 mL via INTRAVENOUS

## 2019-12-17 ENCOUNTER — Ambulatory Visit (INDEPENDENT_AMBULATORY_CARE_PROVIDER_SITE_OTHER): Payer: Medicare Other

## 2019-12-17 DIAGNOSIS — Z0000 Encounter for general adult medical examination without abnormal findings: Secondary | ICD-10-CM

## 2019-12-17 NOTE — Progress Notes (Signed)
Subjective:   Emily Sweeney is a 79 y.o. female who presents for Medicare Annual (Subsequent) preventive examination.  Review of Systems:  No ROS. Medicare Wellness Virtual Visit. Additional risk factors are reflected in social history. Cardiac Risk Factors include: advanced age (>18men, >20 women);diabetes mellitus;dyslipidemia;family history of premature cardiovascular disease;hypertension  Sleep Patterns: No sleep issues, feels rested on waking and sleeps 8 hours nightly. Home Safety/Smoke Alarms: Feels safe in home; uses home alarm. Smoke alarms in place. Living environment: 2-story home.  Lives alone with her dog, no needs for DME, good support system. Seat Belt Safety/Bike Helmet: Wears seat belt.     Objective:    This visit is being conducted via phone call due to the COVID-19 pandemic. This patient has given me verbal consent via phone to conduct this visit, patient states they are participating from their home address. Some vital signs may be absent or patient reported.   Patient identification: identified by name, DOB, and current address.  Location provider: [facility], Office Persons participating in the virtual visit: Billey Gosling, MD, Nurse Health Advisor and patient.    Vitals: There were no vitals taken for this visit.  There is no height or weight on file to calculate BMI.  Advanced Directives 12/17/2019 12/16/2018 06/13/2018 12/14/2017 12/07/2016 08/11/2016 06/13/2016  Does Patient Have a Medical Advance Directive? No Yes Yes No Yes Yes Yes  Type of Advance Directive - Living will Living will - [place];Living will [place];Living will [place];Living will  Does patient want to make changes to medical advance directive? - - - - - - No - Patient declined  Copy of [place] in Chart? - - - - No - copy requested - No - copy requested  Would patient like information on creating a medical  advance directive? No - Patient declined - - Yes (ED - Information included in AVS) - - -    Tobacco Social History   Tobacco Use  Smoking Status Former Smoker  . Quit date: 07/04/1991  . Years since quitting: 28.4  Smokeless Tobacco Never Used     Counseling given: Not Answered   Clinical Intake:  Pre-visit preparation completed: Yes  Pain : No/denies pain     Diabetes: Yes CBG done?: No Did pt. bring in CBG monitor from home?: No  How often do you need to have someone help you when you read instructions, pamphlets, or other written materials from your doctor or pharmacy?: 1 - Never What is the last grade level you completed in school?: College; Psychologist  Interpreter Needed?: No  Information entered by :: Shaquina Gillham N. Lowell Guitar, LPN  Past Medical History:  Diagnosis Date  . Anemia   . Blood transfusion without reported diagnosis   . Diverticulosis of colon   . GERD (gastroesophageal reflux disease)   . Hearing loss    right ear, and tinnitus  . Hyperlipidemia   . Hypertension   . Tubular adenoma 03/05/2015   Polyp, and Benign lymphoid polyp.   Past Surgical History:  Procedure Laterality Date  . APPENDECTOMY    . BREAST BIOPSY     benign  . CATARACT EXTRACTION, BILATERAL  2019, 2020  . COCHLEAR IMPLANT    . CRANIECTOMY FOR EXCISION OF ACOUSTIC NEUROMA  2009  . NASAL SEPTUM SURGERY    . TONSILLECTOMY     Family History  Problem Relation Age of Onset  . Sudden death Father 52  MI  . Coronary artery disease Father   . Heart disease Father   . Dementia Mother 67  . Alcohol abuse Brother   . Colon cancer Neg Hx    Social History   Socioeconomic History  . Marital status: Divorced    Spouse name: Not on file  . Number of children: Not on file  . Years of education: Not on file  . Highest education level: Not on file  Occupational History  . Occupation: psychologist  Tobacco Use  . Smoking status: Former Smoker    Quit date: 07/04/1991      Years since quitting: 28.4  . Smokeless tobacco: Never Used  Substance and Sexual Activity  . Alcohol use: Yes    Alcohol/week: 0.0 standard drinks    Comment: glass wine 4-5 days a week  . Drug use: No  . Sexual activity: Not Currently  Other Topics Concern  . Not on file  Social History Narrative  . Not on file   Social Determinants of Health   Financial Resource Strain:   . Difficulty of Paying Living Expenses:   Food Insecurity:   . Worried About Charity fundraiser in the Last Year:   . Arboriculturist in the Last Year:   Transportation Needs:   . Film/video editor (Medical):   Marland Kitchen Lack of Transportation (Non-Medical):   Physical Activity: Sufficiently Active  . Days of Exercise per Week: 6 days  . Minutes of Exercise per Session: 60 min  Stress:   . Feeling of Stress :   Social Connections:   . Frequency of Communication with Friends and Family:   . Frequency of Social Gatherings with Friends and Family:   . Attends Religious Services:   . Active Member of Clubs or Organizations:   . Attends Archivist Meetings:   Marland Kitchen Marital Status:     Outpatient Encounter Medications as of 12/17/2019  Medication Sig  . aspirin EC 81 MG tablet Take 1 tablet (81 mg total) by mouth daily.  Marland Kitchen atorvastatin (LIPITOR) 20 MG tablet TAKE 1 TABLET ONCE DAILY.  . fluticasone (FLONASE) 50 MCG/ACT nasal spray Place 1 spray into both nostrils as needed for allergies or rhinitis.  . metoprolol succinate (TOPROL-XL) 25 MG 24 hr tablet TAKE (1/2) TABLET DAILY.  . ramipril (ALTACE) 10 MG capsule TAKE (1) CAPSULE DAILY.  Marland Kitchen VITAMIN D, CHOLECALCIFEROL, PO Take 5,000 Units by mouth daily.    No facility-administered encounter medications on file as of 12/17/2019.    Activities of Daily Living In your present state of health, do you have any difficulty performing the following activities: 12/17/2019  Hearing? N  Vision? N  Difficulty concentrating or making decisions? N  Walking or  climbing stairs? N  Dressing or bathing? N  Doing errands, shopping? N  Preparing Food and eating ? N  Using the Toilet? N  In the past six months, have you accidently leaked urine? N  Do you have problems with loss of bowel control? N  Managing your Medications? N  Managing your Finances? N  Housekeeping or managing your Housekeeping? N  Some recent data might be hidden    Patient Care Team: Binnie Rail, MD as PCP - General (Internal Medicine) Katy Apo, MD (Ophthalmology) Sanjuana Kava, MD as Referring Physician (Otolaryngology) Tat, Eustace Quail, DO as Consulting Physician (Neurology)    Assessment:   This is a routine wellness examination for [place].  Exercise Activities and Dietary recommendations  Current Exercise Habits: Home exercise routine, Type of exercise: walking(home exercises), Time (Minutes): 25, Frequency (Times/Week): 7, Weekly Exercise (Minutes/Week): 175, Intensity: Moderate, Exercise limited by: None identified  Goals    . Client understands the importance of follow-up with providers by attending scheduled visits    . DIET - REDUCE SODIUM INTAKE    . DIET - REDUCE SUGAR INTAKE    . Patient Stated     Maintain my current health status by continuing to eat healthy and stay physically and socially active.       Fall Risk Fall Risk  12/17/2019 12/16/2018 09/02/2018 12/14/2017 12/07/2016  Falls in the past year? 0 1 1 Yes Yes  Number falls in past yr: 0 0 1 1 1   Injury with Fall? 0 1 1 Yes No  Risk Factor Category  - - - High Fall Risk -  Risk for fall due to : No Fall Risks Impaired balance/gait - Impaired mobility;Impaired balance/gait Impaired balance/gait;Impaired mobility  Follow up Falls evaluation completed;Education provided - - Falls prevention discussed Falls prevention discussed;Education provided   Is the patient's home free of loose throw rugs in walkways, pet beds, electrical cords, etc?   yes      Grab bars in the bathroom? yes       Handrails on the stairs?   yes      Adequate lighting?   yes    Depression Screen PHQ 2/9 Scores 12/17/2019 12/16/2018 12/14/2017 12/07/2016  PHQ - 2 Score 0 0 0 0  PHQ- 9 Score - - 0 0     Cognitive Function MMSE - Mini Mental State Exam 12/14/2017  Not completed: Refused        Immunization History  Administered Date(s) Administered  . Fluad Quad(high Dose 65+) 06/06/2019  . Influenza Split 07/04/2011, 09/15/2011  . Influenza Whole 05/14/2008, 06/10/2010  . Influenza, High Dose Seasonal PF 06/18/2013, 05/02/2016, 05/25/2017, 07/04/2018  . Influenza,inj,Quad PF,6+ Mos 09/23/2014, 06/09/2015  . PFIZER SARS-COV-2 Vaccination 08/27/2019, 09/16/2019  . Pneumococcal Conjugate-13 10/16/2014  . Pneumococcal Polysaccharide-23 10/21/2008, 10/14/2015  . Td 11/13/2006  . Tdap 05/25/2017  . Zoster 12/28/2006    Qualifies for Shingles Vaccine? Yes  Screening Tests Health Maintenance  Topic Date Due  . FOOT EXAM  12/15/2018  . HEMOGLOBIN A1C  02/28/2019  . OPHTHALMOLOGY EXAM  06/01/2019  . DEXA SCAN  12/18/2019  . COLONOSCOPY  03/07/2020  . INFLUENZA VACCINE  03/14/2020  . TETANUS/TDAP  05/26/2027  . COVID-19 Vaccine  Completed  . PNA vac Low Risk Adult  Completed    Cancer Screenings: Lung: Low Dose CT Chest recommended if Age 12-80 years, 30 pack-year currently smoking OR have quit w/in 15years. Patient does not qualify. Breast:  Up to date on Mammogram? Yes; not recommended due to age  Up to date of Bone Density/Dexa? Yes; not recommended due to age  Colorectal: Yes; due 02/2020     Plan:     Reviewed health maintenance screenings with patient today and relevant education, vaccines, and/or referrals were provided.    Continue doing brain stimulating activities (puzzles, reading, adult coloring books, staying active) to keep memory sharp.    Continue to eat heart healthy diet (full of fruits, vegetables, whole grains, lean protein, water--limit salt, fat, and sugar intake)  and increase physical activity as tolerated.   I have personally reviewed and noted the following in the patient's chart:   . Medical and social history . Use of alcohol, tobacco or illicit drugs  .  Current medications and supplements . Functional ability and status . Nutritional status . Physical activity . Advanced directives . List of other physicians . Hospitalizations, surgeries, and ER visits in previous 12 months . Vitals . Screenings to include cognitive, depression, and falls . Referrals and appointments  In addition, I have reviewed and discussed with patient certain preventive protocols, quality metrics, and best practice recommendations. A written personalized care plan for preventive services as well as general preventive health recommendations were provided to patient.     Sheral Flow, LPN  [id number]  Nurse Health Advisor  Nurse Notes: There were no vitals filed for this visit. There is no height or weight on file to calculate BMI.

## 2019-12-17 NOTE — Patient Instructions (Signed)
Emily Sweeney , Thank you for taking time to come for your Medicare Wellness Visit. I appreciate your ongoing commitment to your health goals. Please review the following plan we discussed and let me know if I can assist you in the future.   Screening recommendations/referrals: Colorectal Screening: 03/08/2015 Mammogram: 01/05/2012 Bone Density: 12/17/2017  Vision and Dental Exams: Recommended annual ophthalmology exams for early detection of glaucoma and other disorders of the eye Recommended annual dental exams for proper oral hygiene  Diabetic Exams: Diabetic Eye Exam: 05/31/2018 Diabetic Foot Exam: 12/14/2017  Vaccinations: Influenza vaccine: 06/06/2019 Pneumococcal vaccine: 10/16/2014, 10/14/2015 Tdap vaccine: 05/25/2017; Due every 10 years Shingles vaccine: Please call your insurance company to determine your out of pocket expense for the Shingrix vaccine. You may receive this vaccine at your local pharmacy. Covid vaccine: [place] 08/27/2019, 09/16/2019  Advanced directives: Advance directives discussed with you today. I have provided a copy for you to complete at home and have notarized. Once this is complete please bring a copy in to our office so we can scan it into your chart.  OR  Advance directives discussed with you today. You have declined to receive documents for completion.  OR  We have received a copy of your POA (Power of Attorney) and/or Living Will. These documents can be located in your chart.  OR  Please bring a copy of your POA (Power of Attorney) and/or Living Will to your next appointment.  Goals:  Recommend to drink at least 6-8 8oz glasses of water per day.  Recommend to exercise for at least 150 minutes per week.  Recommend to remove any items from the home that may cause slips or trips.  Recommend to decrease portion sizes by eating 3 small healthy meals and at least 2 healthy snacks per day.  Recommend to begin DASH diet as directed below  Recommend to  continue efforts to reduce smoking habits until no longer smoking. Smoking Cessation literature is attached below.  Next appointment: Please schedule your Annual Wellness Visit with your Nurse Health Advisor in one year.  Preventive Care 81 Years and Older, Female Preventive care refers to lifestyle choices and visits with your health care provider that can promote health and wellness. What does preventive care include?  A yearly physical exam. This is also called an annual well check.  Dental exams once or twice a year.  Routine eye exams. Ask your health care provider how often you should have your eyes checked.  Personal lifestyle choices, including:  Daily care of your teeth and gums.  Regular physical activity.  Eating a healthy diet.  Avoiding tobacco and drug use.  Limiting alcohol use.  Practicing safe sex.  Taking low-dose aspirin every day if recommended by your health care provider.  Taking vitamin and mineral supplements as recommended by your health care provider. What happens during an annual well check? The services and screenings done by your health care provider during your annual well check will depend on your age, overall health, lifestyle risk factors, and family history of disease. Counseling  Your health care provider may ask you questions about your:  Alcohol use.  Tobacco use.  Drug use.  Emotional well-being.  Home and relationship well-being.  Sexual activity.  Eating habits.  History of falls.  Memory and ability to understand (cognition).  Work and work Statistician.  Reproductive health. Screening  You may have the following tests or measurements:  Height, weight, and BMI.  Blood pressure.  Lipid and cholesterol levels.  These may be checked every 5 years, or more frequently if you are over 20 years old.  Skin check.  Lung cancer screening. You may have this screening every year starting at age 60 if you have a  30-pack-year history of smoking and currently smoke or have quit within the past 15 years.  Fecal occult blood test (FOBT) of the stool. You may have this test every year starting at age 63.  Flexible sigmoidoscopy or colonoscopy. You may have a sigmoidoscopy every 5 years or a colonoscopy every 10 years starting at age 75.  Hepatitis C blood test.  Hepatitis B blood test.  Sexually transmitted disease (STD) testing.  Diabetes screening. This is done by checking your blood sugar (glucose) after you have not eaten for a while (fasting). You may have this done every 1-3 years.  Bone density scan. This is done to screen for osteoporosis. You may have this done starting at age 30.  Mammogram. This may be done every 1-2 years. Talk to your health care provider about how often you should have regular mammograms. Talk with your health care provider about your test results, treatment options, and if necessary, the need for more tests. Vaccines  Your health care provider may recommend certain vaccines, such as:  Influenza vaccine. This is recommended every year.  Tetanus, diphtheria, and acellular pertussis (Tdap, Td) vaccine. You may need a Td booster every 10 years.  Zoster vaccine. You may need this after age 61.  Pneumococcal 13-valent conjugate (PCV13) vaccine. One dose is recommended after age 81.  Pneumococcal polysaccharide (PPSV23) vaccine. One dose is recommended after age 81. Talk to your health care provider about which screenings and vaccines you need and how often you need them. This information is not intended to replace advice given to you by your health care provider. Make sure you discuss any questions you have with your health care provider. Document Released: 08/27/2015 Document Revised: 04/19/2016 Document Reviewed: 06/01/2015 Elsevier Interactive Patient Education  2017 [place] Prevention in the Home Falls can cause injuries. They can happen to people of  all ages. There are many things you can do to make your home safe and to help prevent falls. What can I do on the outside of my home?  Regularly fix the edges of walkways and driveways and fix any cracks.  Remove anything that might make you trip as you walk through a door, such as a raised step or threshold.  Trim any bushes or trees on the path to your home.  Use bright outdoor lighting.  Clear any walking paths of anything that might make someone trip, such as rocks or tools.  Regularly check to see if handrails are loose or broken. Make sure that both sides of any steps have handrails.  Any raised decks and porches should have guardrails on the edges.  Have any leaves, snow, or ice cleared regularly.  Use sand or salt on walking paths during winter.  Clean up any spills in your garage right away. This includes oil or grease spills. What can I do in the bathroom?  Use night lights.  Install grab bars by the toilet and in the tub and shower. Do not use towel bars as grab bars.  Use non-skid mats or decals in the tub or shower.  If you need to sit down in the shower, use a plastic, non-slip stool.  Keep the floor dry. Clean up any water that spills on the floor as soon  as it happens.  Remove soap buildup in the tub or shower regularly.  Attach bath mats securely with double-sided non-slip rug tape.  Do not have throw rugs and other things on the floor that can make you trip. What can I do in the bedroom?  Use night lights.  Make sure that you have a light by your bed that is easy to reach.  Do not use any sheets or blankets that are too big for your bed. They should not hang down onto the floor.  Have a firm chair that has side arms. You can use this for support while you get dressed.  Do not have throw rugs and other things on the floor that can make you trip. What can I do in the kitchen?  Clean up any spills right away.  Avoid walking on wet floors.  Keep  items that you use a lot in easy-to-reach places.  If you need to reach something above you, use a strong step stool that has a grab bar.  Keep electrical cords out of the way.  Do not use floor polish or wax that makes floors slippery. If you must use wax, use non-skid floor wax.  Do not have throw rugs and other things on the floor that can make you trip. What can I do with my stairs?  Do not leave any items on the stairs.  Make sure that there are handrails on both sides of the stairs and use them. Fix handrails that are broken or loose. Make sure that handrails are as long as the stairways.  Check any carpeting to make sure that it is firmly attached to the stairs. Fix any carpet that is loose or worn.  Avoid having throw rugs at the top or bottom of the stairs. If you do have throw rugs, attach them to the floor with carpet tape.  Make sure that you have a light switch at the top of the stairs and the bottom of the stairs. If you do not have them, ask someone to add them for you. What else can I do to help prevent falls?  Wear shoes that:  Do not have high heels.  Have rubber bottoms.  Are comfortable and fit you well.  Are closed at the toe. Do not wear sandals.  If you use a stepladder:  Make sure that it is fully opened. Do not climb a closed stepladder.  Make sure that both sides of the stepladder are locked into place.  Ask someone to hold it for you, if possible.  Clearly mark and make sure that you can see:  Any grab bars or handrails.  First and last steps.  Where the edge of each step is.  Use tools that help you move around (mobility aids) if they are needed. These include:  Canes.  Walkers.  Scooters.  Crutches.  Turn on the lights when you go into a dark area. Replace any light bulbs as soon as they burn out.  Set up your furniture so you have a clear path. Avoid moving your furniture around.  If any of your floors are uneven, fix  them.  If there are any pets around you, be aware of where they are.  Review your medicines with your doctor. Some medicines can make you feel dizzy. This can increase your chance of falling. Ask your doctor what other things that you can do to help prevent falls. This information is not intended to replace advice given to  you by your health care provider. Make sure you discuss any questions you have with your health care provider. Document Released: 05/27/2009 Document Revised: 01/06/2016 Document Reviewed: 09/04/2014 Elsevier Interactive Patient Education  2017 [employer].

## 2019-12-18 ENCOUNTER — Other Ambulatory Visit: Payer: Self-pay | Admitting: Internal Medicine

## 2019-12-18 DIAGNOSIS — E119 Type 2 diabetes mellitus without complications: Secondary | ICD-10-CM

## 2019-12-24 DIAGNOSIS — Z961 Presence of intraocular lens: Secondary | ICD-10-CM | POA: Diagnosis not present

## 2019-12-24 DIAGNOSIS — E119 Type 2 diabetes mellitus without complications: Secondary | ICD-10-CM | POA: Diagnosis not present

## 2020-01-19 ENCOUNTER — Other Ambulatory Visit: Payer: Self-pay | Admitting: Internal Medicine

## 2020-02-04 ENCOUNTER — Other Ambulatory Visit: Payer: Self-pay | Admitting: Internal Medicine

## 2020-02-19 NOTE — Progress Notes (Signed)
Virtual Visit via Video Note  I connected with Emily Sweeney on 02/20/20 at 10:45 AM EDT by a video enabled telemedicine application and verified that I am speaking with the correct person using two identifiers.   I discussed the limitations of evaluation and management by telemedicine and the availability of in person appointments. The patient expressed understanding and agreed to proceed.  Present for the visit:  Myself, Dr Billey Gosling, Emily Sweeney.  The patient is currently at home and I am in the office.    No referring provider.    History of Present Illness: She is here for an acute visit for cold symptoms and for follow-up of her routine chronic medical problems.   Cold symptoms: Her symptoms started several days ago.  She is experiencing a sore throat, her ears feel clogged, cough with occasional sputum production, occasional wheeze.  She denies any fevers or shortness of breath.  She has tried taking mucinex, allergy meds.  She thinks her symptoms are mild and she does not think she needs anything at this time.  She is experiencing GERD a few times a week with occ dysphagia.  She takes medication as needed.  Her knee has been hurting her and has been swollen.  Right now she feels it is better.  Usually she experiences it after sitting for a while-she may have a popping sensation when she gets up and after that she will experience increased pain especially going up and down stairs.  It is not bad enough that she feels she needs to do anything about it.  She is exercising regularly and doing her balance exercises.  She walks her dog daily.     Her BP is 105/62, 93/59.  She denies any lightheadedness or dizziness.  She has not had any chest pain.  Review of Systems  Constitutional: Negative for chills and fever.  HENT: Positive for sore throat. Negative for congestion, ear pain (ears clogged) and sinus pain.        Dysphagia at times  Respiratory: Positive for cough, sputum  production and wheezing. Negative for shortness of breath.   Cardiovascular: Negative for chest pain, palpitations and leg swelling.  Gastrointestinal: Positive for heartburn.  Neurological: Negative for dizziness and headaches.      Social History   Socioeconomic History  . Marital status: Divorced    Spouse name: Not on file  . Number of children: Not on file  . Years of education: Not on file  . Highest education level: Not on file  Occupational History  . Occupation: psychologist  Tobacco Use  . Smoking status: Former Smoker    Quit date: 07/04/1991    Years since quitting: 28.6  . Smokeless tobacco: Never Used  Vaping Use  . Vaping Use: Never used  Substance and Sexual Activity  . Alcohol use: Yes    Alcohol/week: 0.0 standard drinks    Comment: glass wine 4-5 days a week  . Drug use: No  . Sexual activity: Not Currently  Other Topics Concern  . Not on file  Social History Narrative  . Not on file   Social Determinants of Health   Financial Resource Strain:   . Difficulty of Paying Living Expenses:   Food Insecurity:   . Worried About Charity fundraiser in the Last Year:   . Arboriculturist in the Last Year:   Transportation Needs:   . Film/video editor (Medical):   Marland Kitchen Lack of Transportation (Non-Medical):  Physical Activity:   . Days of Exercise per Week:   . Minutes of Exercise per Session:   Stress:   . Feeling of Stress :   Social Connections:   . Frequency of Communication with Friends and Family:   . Frequency of Social Gatherings with Friends and Family:   . Attends Religious Services:   . Active Member of Clubs or Organizations:   . Attends Archivist Meetings:   Marland Kitchen Marital Status:      Observations/Objective: Appears well in NAD   Assessment and Plan:  See Problem List for Assessment and Plan of chronic medical problems.   Follow Up Instructions:    I discussed the assessment and treatment plan with the patient. The  patient was provided an opportunity to ask questions and all were answered. The patient agreed with the plan and demonstrated an understanding of the instructions.   The patient was advised to call back or seek an in-person evaluation if the symptoms worsen or if the condition fails to improve as anticipated.    Binnie Rail, MD

## 2020-02-20 ENCOUNTER — Telehealth (INDEPENDENT_AMBULATORY_CARE_PROVIDER_SITE_OTHER): Payer: Medicare Other | Admitting: Internal Medicine

## 2020-02-20 ENCOUNTER — Encounter: Payer: Self-pay | Admitting: Internal Medicine

## 2020-02-20 DIAGNOSIS — E119 Type 2 diabetes mellitus without complications: Secondary | ICD-10-CM

## 2020-02-20 DIAGNOSIS — E785 Hyperlipidemia, unspecified: Secondary | ICD-10-CM | POA: Diagnosis not present

## 2020-02-20 DIAGNOSIS — M85851 Other specified disorders of bone density and structure, right thigh: Secondary | ICD-10-CM | POA: Diagnosis not present

## 2020-02-20 DIAGNOSIS — D509 Iron deficiency anemia, unspecified: Secondary | ICD-10-CM

## 2020-02-20 DIAGNOSIS — I10 Essential (primary) hypertension: Secondary | ICD-10-CM

## 2020-02-20 DIAGNOSIS — E538 Deficiency of other specified B group vitamins: Secondary | ICD-10-CM

## 2020-02-20 MED ORDER — RAMIPRIL 5 MG PO CAPS: 5.0000 mg | ORAL_CAPSULE | Freq: Every day | ORAL | 3 refills | Status: DC

## 2020-02-20 MED ORDER — ATORVASTATIN CALCIUM 20 MG PO TABS: 20.0000 mg | ORAL_TABLET | Freq: Every day | ORAL | 3 refills | Status: DC

## 2020-02-20 NOTE — Assessment & Plan Note (Signed)
Chronic Last DEXA 2019 Defer DEXA at this time-we will discuss at her next in person visit Continue regular exercise Continue vitamin D We will check vitamin D level

## 2020-02-20 NOTE — Assessment & Plan Note (Addendum)
Chronic Blood pressure on the low side at home Will decrease ramipril to 5 mg daily Continue metoprolol at current dose She will continue to monitor at home CMP, TSH

## 2020-02-20 NOTE — Assessment & Plan Note (Signed)
Chronic Diet controlled She is eating healthy and exercising Check A1c

## 2020-02-20 NOTE — Assessment & Plan Note (Signed)
Chronic Check lipid panel  Continue daily statin Regular exercise and healthy diet encouraged  

## 2020-02-20 NOTE — Assessment & Plan Note (Signed)
Chronic History of B12 deficiency Check B12 level.

## 2020-02-20 NOTE — Assessment & Plan Note (Signed)
History of iron deficiency anemia Check CBC, iron panel 

## 2020-03-07 ENCOUNTER — Other Ambulatory Visit: Payer: Self-pay | Admitting: Internal Medicine

## 2020-03-08 ENCOUNTER — Encounter: Payer: Self-pay | Admitting: Gastroenterology

## 2020-05-06 ENCOUNTER — Ambulatory Visit (INDEPENDENT_AMBULATORY_CARE_PROVIDER_SITE_OTHER): Payer: Medicare Other | Admitting: Gastroenterology

## 2020-05-06 ENCOUNTER — Ambulatory Visit: Payer: Medicare Other | Admitting: Gastroenterology

## 2020-05-06 ENCOUNTER — Encounter: Payer: Self-pay | Admitting: Gastroenterology

## 2020-05-06 ENCOUNTER — Other Ambulatory Visit (INDEPENDENT_AMBULATORY_CARE_PROVIDER_SITE_OTHER): Payer: Medicare Other

## 2020-05-06 VITALS — BP 134/86 | HR 83 | Ht 67.0 in | Wt 148.0 lb

## 2020-05-06 DIAGNOSIS — I10 Essential (primary) hypertension: Secondary | ICD-10-CM | POA: Diagnosis not present

## 2020-05-06 DIAGNOSIS — K219 Gastro-esophageal reflux disease without esophagitis: Secondary | ICD-10-CM

## 2020-05-06 DIAGNOSIS — M85851 Other specified disorders of bone density and structure, right thigh: Secondary | ICD-10-CM | POA: Diagnosis not present

## 2020-05-06 DIAGNOSIS — E119 Type 2 diabetes mellitus without complications: Secondary | ICD-10-CM | POA: Diagnosis not present

## 2020-05-06 DIAGNOSIS — Z86010 Personal history of colonic polyps: Secondary | ICD-10-CM | POA: Diagnosis not present

## 2020-05-06 DIAGNOSIS — R1011 Right upper quadrant pain: Secondary | ICD-10-CM | POA: Diagnosis not present

## 2020-05-06 DIAGNOSIS — R1310 Dysphagia, unspecified: Secondary | ICD-10-CM

## 2020-05-06 DIAGNOSIS — E785 Hyperlipidemia, unspecified: Secondary | ICD-10-CM

## 2020-05-06 LAB — VITAMIN D 25 HYDROXY (VIT D DEFICIENCY, FRACTURES): VITD: 90.32 ng/mL (ref 30.00–100.00)

## 2020-05-06 LAB — CBC WITH DIFFERENTIAL/PLATELET
Basophils Absolute: 0.1 10*3/uL (ref 0.0–0.1)
Basophils Relative: 0.9 % (ref 0.0–3.0)
Eosinophils Absolute: 0.1 10*3/uL (ref 0.0–0.7)
Eosinophils Relative: 1.3 % (ref 0.0–5.0)
HCT: 32.9 % — ABNORMAL LOW (ref 36.0–46.0)
Hemoglobin: 10.4 g/dL — ABNORMAL LOW (ref 12.0–15.0)
Lymphocytes Relative: 22.6 % (ref 12.0–46.0)
Lymphs Abs: 1.6 10*3/uL (ref 0.7–4.0)
MCHC: 31.6 g/dL (ref 30.0–36.0)
MCV: 77.6 fl — ABNORMAL LOW (ref 78.0–100.0)
Monocytes Absolute: 0.7 10*3/uL (ref 0.1–1.0)
Monocytes Relative: 9.9 % (ref 3.0–12.0)
Neutro Abs: 4.5 10*3/uL (ref 1.4–7.7)
Neutrophils Relative %: 65.3 % (ref 43.0–77.0)
Platelets: 242 10*3/uL (ref 150.0–400.0)
RBC: 4.24 Mil/uL (ref 3.87–5.11)
RDW: 19.1 % — ABNORMAL HIGH (ref 11.5–15.5)
WBC: 6.9 10*3/uL (ref 4.0–10.5)

## 2020-05-06 LAB — COMPREHENSIVE METABOLIC PANEL
ALT: 10 U/L (ref 0–35)
AST: 16 U/L (ref 0–37)
Albumin: 4.4 g/dL (ref 3.5–5.2)
Alkaline Phosphatase: 68 U/L (ref 39–117)
BUN: 12 mg/dL (ref 6–23)
CO2: 28 mEq/L (ref 19–32)
Calcium: 9.7 mg/dL (ref 8.4–10.5)
Chloride: 103 mEq/L (ref 96–112)
Creatinine, Ser: 0.94 mg/dL (ref 0.40–1.20)
GFR: 57.14 mL/min — ABNORMAL LOW (ref >60.00)
Glucose, Bld: 108 mg/dL — ABNORMAL HIGH (ref 70–99)
Potassium: 5 mEq/L (ref 3.5–5.1)
Sodium: 137 mEq/L (ref 135–145)
Total Bilirubin: 0.6 mg/dL (ref 0.2–1.2)
Total Protein: 7.2 g/dL (ref 6.0–8.3)

## 2020-05-06 LAB — LIPID PANEL
Cholesterol: 169 mg/dL (ref 0–200)
HDL: 88 mg/dL (ref >39.00)
LDL Cholesterol: 69 mg/dL (ref 0–99)
NonHDL: 80.54
Total CHOL/HDL Ratio: 2
Triglycerides: 60 mg/dL (ref 0.0–149.0)
VLDL: 12 mg/dL (ref 0.0–40.0)

## 2020-05-06 LAB — HEMOGLOBIN A1C: Hgb A1c MFr Bld: 6.3 % (ref 4.6–6.5)

## 2020-05-06 NOTE — Patient Instructions (Signed)
If you are age 81 or older, your body mass index should be between 23-30. Your Body mass index is 23.18 kg/m. If this is out of the aforementioned range listed, please consider follow up with your Primary Care Provider.  If you are age 21 or younger, your body mass index should be between 19-25. Your Body mass index is 23.18 kg/m. If this is out of the aformentioned range listed, please consider follow up with your Primary Care Provider.   You have been scheduled for an endoscopy. Please follow written instructions given to you at your visit today. If you use inhalers (even only as needed), please bring them with you on the day of your procedure.  Thank you for choosing me and [facility].  Pricilla Riffle. Dagoberto Ligas., MD., Marval Regal

## 2020-05-06 NOTE — Progress Notes (Signed)
History of Present Illness: This is an 79 year old female referred by Emily Rail, MD for the evaluation of RUQ pain, GERD, dysphagia and a personal history of adenomatous colon polyps. She has had intermittent problems with reflux symptoms in the past. For the past several months she has had more frequent reflux symptoms and has been taking famotidine on a daily basis which has substantially reduced her symptoms. Occasionally she takes a second dose of famotidine for breakthrough symptoms however her symptoms are generally under much better control. She has had infrequent episodes of solid food dysphagia, mainly with chicken and steak, that have increased in severity over the past 6 months. She has had 2 episodes over the past 6 months. She notes intermittent mild right upper quadrant discomfort associated with gas and bloating following meals. Occasionally she notes mild lower abdominal discomfort. Denies weight loss, constipation, diarrhea, change in stool caliber, melena, hematochezia, nausea, vomiting, chest pain.    Allergies  Allergen Reactions  . Macrobid [Nitrofurantoin Macrocrystal] Other (See Comments)    Fever, pain, fatigue, no appetite  . Norvasc [Amlodipine Besylate]     Palpitations, low bp  . Ceftin [Cefuroxime Axetil] Hives and Rash  . Ciprofloxacin Rash  . Penicillins Hives and Rash    Has patient had a PCN reaction causing immediate rash, facial/tongue/throat swelling, SOB or lightheadedness with hypotension: No Has patient had a PCN reaction causing severe rash involving mucus membranes or skin necrosis: Fayrene Fearing Has patient had a PCN reaction that required hospitalization No Has patient had a PCN reaction occurring within the last 10 years: No If all of the above answers are "NO", then may proceed with Cephalosporin use.   . Sulfonamide Derivatives Hives and Rash   Outpatient Medications Prior to Visit  Medication Sig Dispense Refill  . aspirin EC 81 MG tablet  Take 1 tablet (81 mg total) by mouth daily. 90 tablet 1  . atorvastatin (LIPITOR) 20 MG tablet Take 1 tablet (20 mg total) by mouth daily. 90 tablet 3  . famotidine (PEPCID) 20 MG tablet Take 20 mg by mouth daily.    . fluticasone (FLONASE) 50 MCG/ACT nasal spray Place 1 spray into both nostrils as needed for allergies or rhinitis.    . metoprolol succinate (TOPROL-XL) 25 MG 24 hr tablet TAKE (1/2) TABLET DAILY. 45 tablet 0  . ramipril (ALTACE) 5 MG capsule Take 1 capsule (5 mg total) by mouth daily. 90 capsule 3  . VITAMIN D, CHOLECALCIFEROL, PO Take 5,000 Units by mouth daily.      No facility-administered medications prior to visit.   Past Medical History:  Diagnosis Date  . Anemia   . Blood transfusion without reported diagnosis   . Diverticulosis of colon   . GERD (gastroesophageal reflux disease)   . Hearing loss    right ear, and tinnitus  . Hyperlipidemia   . Hypertension   . Tubular adenoma 03/05/2015   Polyp, and Benign lymphoid polyp.  Marland Kitchen UTI (urinary tract infection)    Past Surgical History:  Procedure Laterality Date  . APPENDECTOMY    . BREAST BIOPSY     benign  . CATARACT EXTRACTION, BILATERAL  2019, 2020  . COCHLEAR IMPLANT    . CRANIECTOMY FOR EXCISION OF ACOUSTIC NEUROMA  2009  . NASAL SEPTUM SURGERY    . TONSILLECTOMY     Social History   Socioeconomic History  . Marital status: Divorced    Spouse name: Not on file  . Number of  children: Not on file  . Years of education: Not on file  . Highest education level: Not on file  Occupational History  . Occupation: psychologist  Tobacco Use  . Smoking status: Former Smoker    Quit date: 07/04/1991    Years since quitting: 28.8  . Smokeless tobacco: Never Used  Vaping Use  . Vaping Use: Never used  Substance and Sexual Activity  . Alcohol use: Yes    Alcohol/week: 0.0 standard drinks    Comment: glass wine 4-5 days a week  . Drug use: No  . Sexual activity: Not Currently  Other Topics Concern  .  Not on file  Social History Narrative  . Not on file   Social Determinants of Health   Financial Resource Strain:   . Difficulty of Paying Living Expenses: Not on file  Food Insecurity:   . Worried About Charity fundraiser in the Last Year: Not on file  . Ran Out of Food in the Last Year: Not on file  Transportation Needs:   . Lack of Transportation (Medical): Not on file  . Lack of Transportation (Non-Medical): Not on file  Physical Activity:   . Days of Exercise per Week: Not on file  . Minutes of Exercise per Session: Not on file  Stress:   . Feeling of Stress : Not on file  Social Connections:   . Frequency of Communication with Friends and Family: Not on file  . Frequency of Social Gatherings with Friends and Family: Not on file  . Attends Religious Services: Not on file  . Active Member of Clubs or Organizations: Not on file  . Attends Archivist Meetings: Not on file  . Marital Status: Not on file   Family History  Problem Relation Age of Onset  . Sudden death Father 82       MI  . Coronary artery disease Father   . Heart disease Father   . Dementia Mother 95  . Alcohol abuse Brother   . Colon cancer Neg Hx   . Esophageal cancer Neg Hx      Review of Systems: Pertinent positive and negative review of systems were noted in the above HPI section. All other review of systems were otherwise negative.   Physical Exam: General: Well developed, well nourished, no acute distress Head: Normocephalic and atraumatic Eyes:  sclerae anicteric, EOMI Ears: Normal auditory acuity Mouth: Not examined, mask on during Covid-19 pandemic Neck: Supple, no masses or thyromegaly Lungs: Clear throughout to auscultation Heart: Regular rate and rhythm; no murmurs, rubs or bruits Abdomen: Soft, non tender and non distended. No masses, hepatosplenomegaly or hernias noted. Normal Bowel sounds Rectal: Deferred to colonoscopy Musculoskeletal: Symmetrical with no gross  deformities  Skin: No lesions on visible extremities Pulses:  Normal pulses noted Extremities: No clubbing, cyanosis, edema or deformities noted Neurological: Alert oriented x 4, grossly nonfocal Cervical Nodes:  No significant cervical adenopathy Inguinal Nodes: No significant inguinal adenopathy Psychological:  Alert and cooperative. Normal mood and affect   Assessment and Recommendations:  1. GERD and dysphagia. R/O esophagitis, stricture. Continue famotidine 20 mg po qd. Increase famotidine to 20 mg po bid if breakthrough symptoms become more frequent. Follow antireflux measures long term. Schedule EGD, possible dilation. The risks (including bleeding, perforation, infection, missed lesions, medication reactions and possible hospitalization or surgery if complications occur), benefits, and alternatives to endoscopy with possible biopsy and possible dilation were discussed with the patient and they consent to proceed.  2. RUQ pain, postprandial with gas, bloating. Blood work today. Gas-X qid prn. Further evaluation with EGD. Abd [place] if symptoms persist and EGD is unrevealing. Consider a trial of hyoscyamine or dicyclomine.    3. Personal history of adenomatous colon polyps. Offered option of surveillance colonoscopy or no further surveillance due to age. After discussing the pros/cons she declines colonoscopy.    cc: Emily Rail, MD [address] [place],  [place] [address]

## 2020-05-06 NOTE — Addendum Note (Signed)
Addended by: Boris Lown B on: 05/06/2020 03:49 PM   Modules accepted: Orders

## 2020-05-10 ENCOUNTER — Encounter: Payer: Self-pay | Admitting: Internal Medicine

## 2020-05-18 ENCOUNTER — Ambulatory Visit: Payer: Medicare Other | Attending: Internal Medicine

## 2020-05-18 DIAGNOSIS — Z23 Encounter for immunization: Secondary | ICD-10-CM

## 2020-05-18 NOTE — Progress Notes (Signed)
   Covid-[facility] Vaccination [facility]  Name:  Emily Sweeney    MRN: [id number] DOB: November 06, 1940  05/18/2020  [name] was observed post Covid-19 immunization for 15 minutes without incident. She was provided with Vaccine Information Sheet and instruction to access the V-Safe system.   [name] was instructed to call 911 with any severe reactions post vaccine: Marland Kitchen Difficulty breathing  . Swelling of face and throat  . A fast heartbeat  . A bad rash all over body  . Dizziness and weakness

## 2020-05-28 ENCOUNTER — Other Ambulatory Visit: Payer: Self-pay

## 2020-05-28 ENCOUNTER — Ambulatory Visit (INDEPENDENT_AMBULATORY_CARE_PROVIDER_SITE_OTHER): Payer: Medicare Other | Admitting: *Deleted

## 2020-05-28 DIAGNOSIS — Z23 Encounter for immunization: Secondary | ICD-10-CM | POA: Diagnosis not present

## 2020-05-31 ENCOUNTER — Encounter: Payer: Medicare Other | Admitting: Gastroenterology

## 2020-06-02 ENCOUNTER — Encounter: Payer: Self-pay | Admitting: Gastroenterology

## 2020-06-02 ENCOUNTER — Ambulatory Visit (AMBULATORY_SURGERY_CENTER): Payer: Medicare Other | Admitting: Gastroenterology

## 2020-06-02 ENCOUNTER — Other Ambulatory Visit: Payer: Self-pay

## 2020-06-02 VITALS — BP 126/56 | HR 85 | Temp 96.8°F | Resp 17 | Ht 67.0 in | Wt 148.0 lb

## 2020-06-02 DIAGNOSIS — K219 Gastro-esophageal reflux disease without esophagitis: Secondary | ICD-10-CM

## 2020-06-02 DIAGNOSIS — K319 Disease of stomach and duodenum, unspecified: Secondary | ICD-10-CM

## 2020-06-02 DIAGNOSIS — R1011 Right upper quadrant pain: Secondary | ICD-10-CM

## 2020-06-02 DIAGNOSIS — R1310 Dysphagia, unspecified: Secondary | ICD-10-CM | POA: Diagnosis not present

## 2020-06-02 DIAGNOSIS — K2960 Other gastritis without bleeding: Secondary | ICD-10-CM | POA: Diagnosis not present

## 2020-06-02 DIAGNOSIS — K222 Esophageal obstruction: Secondary | ICD-10-CM | POA: Diagnosis not present

## 2020-06-02 DIAGNOSIS — K449 Diaphragmatic hernia without obstruction or gangrene: Secondary | ICD-10-CM

## 2020-06-02 DIAGNOSIS — K3189 Other diseases of stomach and duodenum: Secondary | ICD-10-CM | POA: Diagnosis not present

## 2020-06-02 HISTORY — PX: UPPER GASTROINTESTINAL ENDOSCOPY: SHX188

## 2020-06-02 MED ORDER — PANTOPRAZOLE SODIUM 40 MG PO TBEC: 40.0000 mg | DELAYED_RELEASE_TABLET | Freq: Every day | ORAL | 3 refills | Status: DC

## 2020-06-02 MED ORDER — SODIUM CHLORIDE 0.9 % IV SOLN: 500.0000 mL | Freq: Once | INTRAVENOUS | Status: DC

## 2020-06-02 NOTE — Progress Notes (Signed)
Called to room to assist during endoscopic procedure.  Patient ID and intended procedure confirmed with present staff. Received instructions for my participation in the procedure from the performing physician.  

## 2020-06-02 NOTE — Progress Notes (Signed)
Pt's states no medical or surgical changes since previsit or office visit.   Vitals EW 

## 2020-06-02 NOTE — Patient Instructions (Signed)
Information on hiatal hernias given to you today.  Await pathology results.  Clear liquid diet for two hours - until 12:30pm today.  Soft diet the rest of today.  Resume regular diet tomorrow.  Continue present medications.  Protonix (Pantoprazole) 40 mg by mouth every day.  Discontinue Famatodine.  YOU HAD AN ENDOSCOPIC PROCEDURE TODAY AT [facility]:   Refer to the procedure report that was given to you for any specific questions about what was found during the examination.  If the procedure report does not answer your questions, please call your gastroenterologist to clarify.  If you requested that your care partner not be given the details of your procedure findings, then the procedure report has been included in a sealed envelope for you to review at your convenience later.  YOU SHOULD EXPECT: Some feelings of bloating in the abdomen. Passage of more gas than usual.  Walking can help get rid of the air that was put into your GI tract during the procedure and reduce the bloating. If you had a lower endoscopy (such as a colonoscopy or flexible sigmoidoscopy) you may notice spotting of blood in your stool or on the toilet paper. If you underwent a bowel prep for your procedure, you may not have a normal bowel movement for a few days.  Please Note:  You might notice some irritation and congestion in your nose or some drainage.  This is from the oxygen used during your procedure.  There is no need for concern and it should clear up in a day or so.  SYMPTOMS TO REPORT IMMEDIATELY:    Following upper endoscopy (EGD)  Vomiting of blood or coffee ground material  New chest pain or pain under the shoulder blades  Painful or persistently difficult swallowing  New shortness of breath  Fever of 100F or higher  Black, tarry-looking stools  For urgent or emergent issues, a gastroenterologist can be reached at any hour by calling [phone]. Do not use MyChart messaging for  urgent concerns.    DIET:  We do recommend a small meal at first, but then you may proceed to your regular diet.  Drink plenty of fluids but you should avoid alcoholic beverages for 24 hours.  ACTIVITY:  You should plan to take it easy for the rest of today and you should NOT DRIVE or use heavy machinery until tomorrow (because of the sedation medicines used during the test).    FOLLOW UP: Our staff will call the number listed on your records 48-72 hours following your procedure to check on you and address any questions or concerns that you may have regarding the information given to you following your procedure. If we do not reach you, we will leave a message.  We will attempt to reach you two times.  During this call, we will ask if you have developed any symptoms of COVID 19. If you develop any symptoms (ie: fever, flu-like symptoms, shortness of breath, cough etc.) before then, please call [phone].  If you test positive for Covid 19 in the 2 weeks post procedure, please call and report this information to [place].    If any biopsies were taken you will be contacted by phone or by letter within the next 1-3 weeks.  Please call us at [phone] if you have not heard about the biopsies in 3 weeks.    SIGNATURES/CONFIDENTIALITY: You and/or your care partner have signed paperwork which will be entered into your electronic medical record.  These signatures attest to the fact that that the information above on your After Visit Summary has been reviewed and is understood.  Full responsibility of the confidentiality of this discharge information lies with you and/or your care-partner.

## 2020-06-02 NOTE — Progress Notes (Signed)
A and O x3. Report to RN. Tolerated MAC anesthesia well.Teeth unchanged after procedure.

## 2020-06-02 NOTE — Op Note (Signed)
[facility] Patient Name: Emily Sweeney Procedure Date: 06/02/2020 10:06 AM MRN: [id number] Endoscopist: Ladene Artist , MD Age: 79 Referring MD:  Date of Birth: 1940-11-22 Gender: Female Account #: [id number] Procedure:                Upper GI endoscopy Indications:              Abdominal pain in the right upper quadrant,                            Dysphagia, Gastroesophageal reflux disease Medicines:                Monitored Anesthesia Care Procedure:                Pre-Anesthesia Assessment:                           - Prior to the procedure, a History and Physical                            was performed, and patient medications and                            allergies were reviewed. The patient's tolerance of                            previous anesthesia was also reviewed. The risks                            and benefits of the procedure and the sedation                            options and risks were discussed with the patient.                            All questions were answered, and informed consent                            was obtained. Prior Anticoagulants: The patient has                            taken no previous anticoagulant or antiplatelet                            agents. ASA Grade Assessment: II - A patient with                            mild systemic disease. After reviewing the risks                            and benefits, the patient was deemed in                            satisfactory condition to undergo the procedure.  After obtaining informed consent, the endoscope was                            passed under direct vision. Throughout the                            procedure, the patient's blood pressure, pulse, and                            oxygen saturations were monitored continuously. The                            Endoscope was introduced through the mouth, and                            advanced to the  second part of duodenum. The upper                            GI endoscopy was accomplished without difficulty.                            The patient tolerated the procedure well. Scope In: Scope Out: Findings:                 One benign-appearing, intrinsic moderate stenosis                            was found 33 cm from the incisors. This stenosis                            measured 1.1 cm (inner diameter) x less than one cm                            (in length). The stenosis was traversed. A                            guidewire was placed and the scope was withdrawn.                            Dilations were performed with Savary dilators with                            mild resistance at 13 mm, 14 mm amd 15 mm. No heme                            noted.                           The exam of the esophagus was otherwise normal.                           A large hiatal hernia was present.  A few localized medium erosions with no bleeding                            and no stigmata of recent bleeding were found on                            the greater curvature of the stomach. Biopsies were                            taken with a cold forceps for histology.                           Patchy mildly erythematous mucosa without bleeding                            was found in the gastric body and in the gastric                            antrum. Biopsies were taken with a cold forceps for                            histology.                           The exam of the stomach was otherwise normal.                           The duodenal bulb and second portion of the                            duodenum were normal. Complications:            No immediate complications. Estimated Blood Loss:     Estimated blood loss was minimal. Impression:               - Benign-appearing esophageal stenosis. Dilated.                           - Large hiatal hernia.                            - Erosive gastropathy with no bleeding and no                            stigmata of recent bleeding. Biopsied.                           - Erythematous mucosa in the gastric body and                            antrum. Biopsied.                           - Normal duodenal bulb and second portion of the  duodenum. Recommendation:           - Patient has a contact number available for                            emergencies. The signs and symptoms of potential                            delayed complications were discussed with the                            patient. Return to normal activities tomorrow.                            Written discharge instructions were provided to the                            patient.                           - Clear liquid diet for 2 hours, then advance as                            tolerated to soft diet.                           - Resume previous diet tomorrow.                           - Follow antireflux measures long term.                           - Continue present medications.                           - Await pathology results.                           - Return to GI office in 6 weeks.                           - Protonix (pantoprazole) 40 mg PO daily, 1 year of                            refills.                           - Discontinue famotidine. Ladene Artist, MD 06/02/2020 10:29:47 AM This report has been signed electronically.

## 2020-06-04 ENCOUNTER — Telehealth: Payer: Self-pay

## 2020-06-04 ENCOUNTER — Other Ambulatory Visit: Payer: Self-pay | Admitting: Internal Medicine

## 2020-06-04 ENCOUNTER — Telehealth: Payer: Self-pay | Admitting: *Deleted

## 2020-06-04 NOTE — Telephone Encounter (Signed)
Second attempt, left VM.  

## 2020-06-04 NOTE — Telephone Encounter (Signed)
Left message on answering machine. 

## 2020-06-10 ENCOUNTER — Encounter: Payer: Self-pay | Admitting: Gastroenterology

## 2020-08-11 ENCOUNTER — Telehealth: Payer: Medicare Other | Admitting: Emergency Medicine

## 2020-08-11 DIAGNOSIS — M549 Dorsalgia, unspecified: Secondary | ICD-10-CM

## 2020-08-11 MED ORDER — LIDOCAINE 5 % EX PTCH: 1.0000 | MEDICATED_PATCH | CUTANEOUS | 0 refills | Status: DC

## 2020-08-11 NOTE — Progress Notes (Signed)
We are sorry that you are not feeling well.  Here is how we plan to help!  Based on what you have shared with me it looks like you mostly have acute back pain.  Acute back pain is defined as musculoskeletal pain that can resolve in 1-3 weeks with conservative treatment.  I have prescribed Lidoderm patches. I also recommend taking Tylenol or Ibuprofen as needed for pain. Back pain is very common.  The pain often gets better over time.  The cause of back pain is usually not dangerous.  Most people can learn to manage their back pain on their own.  Home Care  Stay active.  Start with short walks on flat ground if you can.  Try to walk farther each day.  Do not sit, drive or stand in one place for more than 30 minutes.  Do not stay in bed.  Do not avoid exercise or work.  Activity can help your back heal faster.  Be careful when you bend or lift an object.  Bend at your knees, keep the object close to you, and do not twist.  Sleep on a firm mattress.  Lie on your side, and bend your knees.  If you lie on your back, put a pillow under your knees.  Only take medicines as told by your doctor.  Put ice on the injured area.  Put ice in a plastic bag  Place a towel between your skin and the bag  Leave the ice on for 15-20 minutes, 3-4 times a day for the first 2-3 days. 210 After that, you can switch between ice and heat packs.  Ask your doctor about back exercises or massage.  Avoid feeling anxious or stressed.  Find good ways to deal with stress, such as exercise.  Get Help Right Way If:  Your pain does not go away with rest or medicine.  Your pain does not go away in 1 week.  You have new problems.  You do not feel well.  The pain spreads into your legs.  You cannot control when you poop (bowel movement) or pee (urinate)  You feel sick to your stomach (nauseous) or throw up (vomit)  You have belly (abdominal) pain.  You feel like you may pass out (faint).  If you  develop a fever.  Make Sure you:  Understand these instructions.  Will watch your condition  Will get help right away if you are not doing well or get worse.  Your e-visit answers were reviewed by a board certified advanced clinical practitioner to complete your personal care plan.  Depending on the condition, your plan could have included both over the counter or prescription medications.  If there is a problem please reply  once you have received a response from your provider.  Your safety is important to [place].  If you have drug allergies check your prescription carefully.    You can use MyChart to ask questions about today's visit, request a non-urgent call back, or ask for a work or school excuse for 24 hours related to this e-Visit. If it has been greater than 24 hours you will need to follow up with your provider, or enter a new e-Visit to address those concerns.  You will get an e-mail in the next two days asking about your experience.  I hope that your e-visit has been valuable and will speed your recovery. Thank you for using e-visits.  Approximately 5 minutes was used in reviewing the patient's chart,  questionnaire, prescribing medications, and documentation.

## 2020-08-14 DIAGNOSIS — E871 Hypo-osmolality and hyponatremia: Secondary | ICD-10-CM

## 2020-08-14 DIAGNOSIS — M1712 Unilateral primary osteoarthritis, left knee: Secondary | ICD-10-CM

## 2020-08-14 HISTORY — DX: Unilateral primary osteoarthritis, left knee: M17.12

## 2020-08-14 HISTORY — DX: Hypo-osmolality and hyponatremia: E87.1
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
## 2020-08-16 ENCOUNTER — Telehealth (INDEPENDENT_AMBULATORY_CARE_PROVIDER_SITE_OTHER): Payer: Medicare Other | Admitting: Internal Medicine

## 2020-08-16 ENCOUNTER — Encounter: Payer: Self-pay | Admitting: Internal Medicine

## 2020-08-16 DIAGNOSIS — M5416 Radiculopathy, lumbar region: Secondary | ICD-10-CM | POA: Diagnosis not present

## 2020-08-16 MED ORDER — MELOXICAM 15 MG PO TABS: 15.0000 mg | ORAL_TABLET | Freq: Every day | ORAL | 0 refills | Status: DC

## 2020-08-16 MED ORDER — FLUOCINOLONE ACETONIDE 0.01 % EX SHAM: MEDICATED_SHAMPOO | CUTANEOUS | 0 refills | Status: DC

## 2020-08-16 MED ORDER — GABAPENTIN 100 MG PO CAPS: 100.0000 mg | ORAL_CAPSULE | Freq: Two times a day (BID) | ORAL | 3 refills | Status: DC

## 2020-08-16 NOTE — Assessment & Plan Note (Signed)
Acute H/o of lumbar radiculopathy in 2017 - did PT, had injections by Dr Venetia Maxon Similar symptoms now Discussed treatment options She will continue PT exercises at home Start gabapentin 100 mg BID Declined prednisone, start meloxicam 15 mg daily She will call if no improvement

## 2020-08-16 NOTE — Progress Notes (Signed)
Virtual Visit via Video Note  I connected with Emily Sweeney on 08/16/20 at  2:30 PM EST by a video enabled telemedicine application and verified that I am speaking with the correct person using two identifiers.   I discussed the limitations of evaluation and management by telemedicine and the availability of in person appointments. The patient expressed understanding and agreed to proceed.  Present for the visit:  Myself, Dr Cheryll Cockayne, Emily Sweeney.  The patient is currently at home and I am in the office.    No referring provider.    History of Present Illness: This is an acute visit.  Acute back/ leg pain - it started 12/26.  The pain is in her right leg.  It is similar to years ago.   Starts at hip and goes down her anterior thigh and down lower leg- front and back.    She has done PT exercises and they have not helped.  She has used arnicare,  otc for neuropathy.  She has taken ibuprofen.  The pain varies.  The pain is worse with walking.  Pain in right hip with sitting.  sleeping is not a problem.     Some leg weakness that seems to be from pain.  No N/T.  She has back pain from time to time.  No muscle spasms. Denies other symptoms.    Social History   Socioeconomic History  . Marital status: Divorced    Spouse name: Not on file  . Number of children: Not on file  . Years of education: Not on file  . Highest education level: Not on file  Occupational History  . Occupation: psychologist  Tobacco Use  . Smoking status: Former Smoker    Quit date: 07/04/1991    Years since quitting: 29.1  . Smokeless tobacco: Never Used  Vaping Use  . Vaping Use: Never used  Substance and Sexual Activity  . Alcohol use: Yes    Alcohol/week: 0.0 standard drinks    Comment: glass wine 4-5 days a week  . Drug use: No  . Sexual activity: Not Currently  Other Topics Concern  . Not on file  Social History Narrative  . Not on file   Social Determinants of Health   Financial  Resource Strain: Not on file  Food Insecurity: Not on file  Transportation Needs: Not on file  Physical Activity: Not on file  Stress: Not on file  Social Connections: Not on file     Observations/Objective: Appears well in NAD   Assessment and Plan:  See Problem List for Assessment and Plan of chronic medical problems.   Follow Up Instructions:    I discussed the assessment and treatment plan with the patient. The patient was provided an opportunity to ask questions and all were answered. The patient agreed with the plan and demonstrated an understanding of the instructions.   The patient was advised to call back or seek an in-person evaluation if the symptoms worsen or if the condition fails to improve as anticipated.    Pincus Sanes, MD

## 2020-08-18 ENCOUNTER — Other Ambulatory Visit: Payer: Medicare Other

## 2020-08-18 ENCOUNTER — Other Ambulatory Visit: Payer: Self-pay

## 2020-08-18 ENCOUNTER — Encounter: Payer: Self-pay | Admitting: Internal Medicine

## 2020-08-18 DIAGNOSIS — Z20822 Contact with and (suspected) exposure to covid-19: Secondary | ICD-10-CM

## 2020-08-20 LAB — SARS-COV-2, NAA 2 DAY TAT

## 2020-08-20 LAB — NOVEL CORONAVIRUS, NAA: SARS-CoV-2, NAA: NOT DETECTED

## 2020-08-20 LAB — SPECIMEN STATUS REPORT

## 2020-08-21 MED ORDER — TRAMADOL HCL 50 MG PO TABS: 50.0000 mg | ORAL_TABLET | Freq: Three times a day (TID) | ORAL | 0 refills | Status: AC | PRN

## 2020-08-21 NOTE — Addendum Note (Signed)
Addended by: Binnie Rail on: 08/21/2020 03:28 PM   Modules accepted: Orders

## 2020-08-25 ENCOUNTER — Ambulatory Visit: Payer: Medicare Other | Admitting: Gastroenterology

## 2020-09-02 ENCOUNTER — Other Ambulatory Visit: Payer: Self-pay | Admitting: Internal Medicine

## 2020-09-02 MED ORDER — METOPROLOL SUCCINATE ER 25 MG PO TB24: 25.0000 mg | ORAL_TABLET | Freq: Every day | ORAL | 1 refills | Status: DC

## 2020-10-01 ENCOUNTER — Telehealth: Payer: Self-pay | Admitting: Internal Medicine

## 2020-10-01 NOTE — Progress Notes (Signed)
  Chronic Care Management   Note  10/01/2020 Name: Emily Sweeney MRN: [id number] DOB: 1941/03/02  Emily Sweeney is a 80 y.o. year old female who is a primary care patient of Burns, Claudina Lick, MD. I reached out to Emily Sweeney by phone today in response to a referral sent by Emily Sweeney PCP, Emily Rail, MD.   Emily Sweeney was given information about Chronic Care Management services today including:  1. CCM service includes personalized support from designated clinical staff supervised by her physician, including individualized plan of care and coordination with other care providers 2. 24/7 contact phone numbers for assistance for urgent and routine care needs. 3. Service will only be billed when office clinical staff spend 20 minutes or more in a month to coordinate care. 4. Only one practitioner may furnish and bill the service in a calendar month. 5. The patient may stop CCM services at any time (effective at the end of the month) by phone call to the office staff.   Patient agreed to services and verbal consent obtained.   Follow up plan:   Carley Perdue [employer] Scheduler

## 2020-10-20 ENCOUNTER — Ambulatory Visit: Payer: Self-pay

## 2020-10-21 ENCOUNTER — Ambulatory Visit (HOSPITAL_COMMUNITY)
Admission: EM | Admit: 2020-10-21 | Discharge: 2020-10-21 | Disposition: A | Discharge status: Home / Self Care | Payer: Medicare Other | Attending: Student | Admitting: Student

## 2020-10-21 ENCOUNTER — Telehealth: Payer: Self-pay | Admitting: Internal Medicine

## 2020-10-21 ENCOUNTER — Other Ambulatory Visit: Payer: Self-pay

## 2020-10-21 ENCOUNTER — Encounter (HOSPITAL_COMMUNITY): Payer: Self-pay

## 2020-10-21 ENCOUNTER — Ambulatory Visit (INDEPENDENT_AMBULATORY_CARE_PROVIDER_SITE_OTHER): Payer: Medicare Other

## 2020-10-21 DIAGNOSIS — M25462 Effusion, left knee: Secondary | ICD-10-CM

## 2020-10-21 DIAGNOSIS — M7989 Other specified soft tissue disorders: Secondary | ICD-10-CM | POA: Diagnosis not present

## 2020-10-21 DIAGNOSIS — M25562 Pain in left knee: Secondary | ICD-10-CM | POA: Diagnosis not present

## 2020-10-21 DIAGNOSIS — L03119 Cellulitis of unspecified part of limb: Secondary | ICD-10-CM

## 2020-10-21 DIAGNOSIS — M1712 Unilateral primary osteoarthritis, left knee: Secondary | ICD-10-CM | POA: Diagnosis not present

## 2020-10-21 DIAGNOSIS — R2681 Unsteadiness on feet: Secondary | ICD-10-CM

## 2020-10-21 DIAGNOSIS — L02419 Cutaneous abscess of limb, unspecified: Secondary | ICD-10-CM | POA: Diagnosis not present

## 2020-10-21 MED ORDER — DOXYCYCLINE HYCLATE 100 MG PO CAPS: 100.0000 mg | ORAL_CAPSULE | Freq: Two times a day (BID) | ORAL | 0 refills | Status: AC

## 2020-10-21 MED ORDER — RAMIPRIL 5 MG PO CAPS: 5.0000 mg | ORAL_CAPSULE | Freq: Every day | ORAL | 3 refills | Status: DC

## 2020-10-21 NOTE — Discharge Instructions (Addendum)
-  For your skin infection, start the antibiotic- doxycycline twice daily for 7 days. Wear sunscreen while you'll be in the sun. Try to avoid taking this within 1 hour of eating.  -Try warm compresses on the skin infection to help any pus come out.  -Attend your ultrasound appointment tomorrow, to rule out a blood clot. This tomorrow morning at 8am, at [facility], entrance [address]. -If you experience ANY chest pain, shortness of breath, dizziness- head straight to the ED, or call 911.  -Keep your follow-up appointment with your PCP for next Monday 10/25/20.

## 2020-10-21 NOTE — Telephone Encounter (Signed)
Patient went to [facility] today and was treated.

## 2020-10-21 NOTE — ED Notes (Signed)
Schedule ultrasound appointment.  Appointment is for 10/22/2020 at 0800.  Reported to Marin Roberts, PA.

## 2020-10-21 NOTE — ED Triage Notes (Signed)
Pt presents with bilateral leg pain x 1 week. Pt states her left leg has been swelling. Pt states she fell 1 week ago.
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
## 2020-10-21 NOTE — Telephone Encounter (Signed)
Called pt, LVM to call office to be seen sooner than 10/25/20.

## 2020-10-22 ENCOUNTER — Ambulatory Visit (HOSPITAL_COMMUNITY)
Admission: RE | Admit: 2020-10-22 | Discharge: 2020-10-22 | Disposition: A | Discharge status: Home / Self Care | Payer: Medicare Other | Source: Ambulatory Visit | Attending: Surgery | Admitting: Surgery

## 2020-10-22 ENCOUNTER — Other Ambulatory Visit (HOSPITAL_COMMUNITY): Payer: Self-pay | Admitting: Student

## 2020-10-22 ENCOUNTER — Other Ambulatory Visit: Payer: Self-pay

## 2020-10-22 DIAGNOSIS — M79605 Pain in left leg: Secondary | ICD-10-CM

## 2020-10-22 DIAGNOSIS — M7989 Other specified soft tissue disorders: Secondary | ICD-10-CM

## 2020-10-22 NOTE — Progress Notes (Signed)
Lower extremity venous has been completed.   Preliminary results in CV Proc.   Abram Sander 10/22/2020 8:14 AM

## 2020-10-24 NOTE — Patient Instructions (Addendum)
    Blood work was ordered.     Medications changes include :  none   Your prescription(s) have been submitted to your pharmacy. Please take as directed and contact our office if you believe you are having problem(s) with the medication(s).   Please followup in 6 months  
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
## 2020-10-25 ENCOUNTER — Encounter: Payer: Self-pay | Admitting: Internal Medicine

## 2020-10-25 ENCOUNTER — Other Ambulatory Visit: Payer: Self-pay

## 2020-10-25 ENCOUNTER — Ambulatory Visit (INDEPENDENT_AMBULATORY_CARE_PROVIDER_SITE_OTHER): Payer: Medicare Other | Admitting: Internal Medicine

## 2020-10-25 VITALS — BP 140/78 | HR 83 | Temp 98.1°F | Ht 67.0 in | Wt 149.6 lb

## 2020-10-25 DIAGNOSIS — E538 Deficiency of other specified B group vitamins: Secondary | ICD-10-CM

## 2020-10-25 DIAGNOSIS — G8929 Other chronic pain: Secondary | ICD-10-CM | POA: Diagnosis not present

## 2020-10-25 DIAGNOSIS — E559 Vitamin D deficiency, unspecified: Secondary | ICD-10-CM

## 2020-10-25 DIAGNOSIS — L03115 Cellulitis of right lower limb: Secondary | ICD-10-CM | POA: Diagnosis not present

## 2020-10-25 DIAGNOSIS — T148XXA Other injury of unspecified body region, initial encounter: Secondary | ICD-10-CM | POA: Diagnosis not present

## 2020-10-25 DIAGNOSIS — E119 Type 2 diabetes mellitus without complications: Secondary | ICD-10-CM

## 2020-10-25 DIAGNOSIS — I10 Essential (primary) hypertension: Secondary | ICD-10-CM

## 2020-10-25 DIAGNOSIS — M25562 Pain in left knee: Secondary | ICD-10-CM | POA: Diagnosis not present

## 2020-10-25 DIAGNOSIS — E785 Hyperlipidemia, unspecified: Secondary | ICD-10-CM | POA: Diagnosis not present

## 2020-10-25 MED ORDER — RAMIPRIL 10 MG PO CAPS: 10.0000 mg | ORAL_CAPSULE | Freq: Every day | ORAL | 3 refills | Status: DC

## 2020-10-25 NOTE — Assessment & Plan Note (Signed)
Acute Right medial calf-related to fall over a week ago Has improved Continue warm compresses Advised this will take a few weeks to go away

## 2020-10-25 NOTE — Assessment & Plan Note (Signed)
Acute on chronic Exam suggestive of osteoarthritis Can see her orthopedic for further evaluation and treatment Advised that she can try Voltaren gel, Tylenol for Aleve as needed

## 2020-10-25 NOTE — Assessment & Plan Note (Signed)
Chronic Has not been recently taking vitamin B12 or getting monthly injections Check B12 level

## 2020-10-25 NOTE — Assessment & Plan Note (Signed)
Chronic Blood pressure has been variable at home recently-she thinks this variability in elevation is related to her recent pain in the death of her dog She would like to avoid making any changes at this time and just monitor Continue ramipril 10 mg daily and metoprolol 25 mg daily-can consider increasing metoprolol if needed She will update me of her BP is consistently elevated/variable CMP

## 2020-10-25 NOTE — Assessment & Plan Note (Signed)
Chronic Diet controlled Check A1c today

## 2020-10-25 NOTE — Assessment & Plan Note (Signed)
Chronic Check lipid panel, CMP Continue atorvastatin 20 mg daily 

## 2020-10-25 NOTE — Assessment & Plan Note (Signed)
Chronic Taking vitamin D daily Check vitamin D level  

## 2020-10-25 NOTE — Assessment & Plan Note (Signed)
Acute On doxycycline and improving Complete full doxycycline course

## 2020-11-02 DIAGNOSIS — M545 Low back pain, unspecified: Secondary | ICD-10-CM | POA: Diagnosis not present

## 2020-11-12 DEATH — deceased

## 2020-11-18 ENCOUNTER — Telehealth: Payer: Self-pay

## 2020-11-18 NOTE — Progress Notes (Signed)
Chronic Care Management Pharmacy Assistant   Name: Shavelle Runkel  MRN: [id number] DOB: 1941-03-19  Reason for Encounter: Initial Questions Patient aware of Telephone Appt. 11/19/20 @ 11 am    Recent office visits:  10/25/20 [facility]- Bil[facility] Gosling - PCP  Recent consult visits:  10/22/20 [place] visits:  Medication Reconciliation was completed by comparing discharge summary, patient's EMR and Pharmacy list, and upon discussion with patient.  Admitted to the hospital on 10/22/20 due to DVT. Discharge date was 10/22/20. Discharged from [place]?Medications Started at [facility] Discharge:?? None  Medication Changes at Hospital Discharge: -Changed None  Medications Discontinued at Hospital Discharge: -None  Medications that remain the same after Hospital Discharge:??  -All other medications will remain the same.    Medications: Outpatient Encounter Medications as of 11/18/2020  Medication Sig  . aspirin EC 81 MG tablet Take 1 tablet (81 mg total) by mouth daily.  Marland Kitchen atorvastatin (LIPITOR) 20 MG tablet Take 1 tablet (20 mg total) by mouth daily.  Marland Kitchen doxycycline (VIBRA-TABS) 100 MG tablet Take 100 mg by mouth 2 (two) times daily.  . Fluocinolone Acetonide 0.01 % SHAM Use daily prn.  Leave on scalp for 5 minutes then rinse off  . Fluocinolone Acetonide Scalp 0.01 % OIL Apply topically.  . fluticasone (FLONASE) 50 MCG/ACT nasal spray Place 1 spray into both nostrils as needed for allergies or rhinitis.  Marland Kitchen gabapentin (NEURONTIN) 100 MG capsule Take 1 capsule (100 mg total) by mouth 2 (two) times daily.  . meloxicam (MOBIC) 15 MG tablet Take 1 tablet (15 mg total) by mouth daily.  . metoprolol succinate (TOPROL-XL) 25 MG 24 hr tablet Take 1 tablet (25 mg total) by mouth daily.  . pantoprazole (PROTONIX) 40 MG tablet Take 1 tablet (40 mg total) by mouth daily.  . ramipril (ALTACE) 10 MG capsule Take 1 capsule (10 mg total) by mouth daily.   Marland Kitchen VITAMIN D, CHOLECALCIFEROL, PO Take 5,000 Units by mouth daily.    No facility-administered encounter medications on file as of 11/18/2020.    Have you seen any other providers since your last visit?  Patient states she recently seen her Orthro provider.  Any changes in your medications or health?  Patient states she was recently prescribed Prednisone.  Any side effects from any medications?  Patient states no side effects at this time.  Do you have an symptoms or problems not managed by your  Medications? Patient states not at this time.  Any concerns about your health right now?  Patient states she has constant leg pain all the time. She stated also having an ulcer injury that haven't heal.  Has your provider asked that you check blood pressure, blood sugar, or follow special diet at home?  Patient states she checks her BP regularly and sends her recordings to her PCP office. Her last two readings are as follow: 4/6 137/90 4/4 139/99  Do you get any type of exercise on a regular basis?  Patient states she used to walk her dog 3x a day until her dog passed away a month ago.   Can you think of a goal you would like to reach for your health?  Patient states she don't have any goals at this time, she states she still works and doing pretty good for her age.   Do you have any problems getting your medications?  Patient states no problem getting medications  Is there anything that  you would like to discuss during the appointment? Patient states not at this time.  Please bring medications and supplements to appointment   Star Rating Drugs: Atorvastatin 09/03/20 90D Ramipril 10/25/20 [place], RMA Clinical Pharmacists Assistant [phone]  Time Spent:49

## 2020-11-19 ENCOUNTER — Other Ambulatory Visit: Payer: Self-pay

## 2020-11-19 ENCOUNTER — Ambulatory Visit (INDEPENDENT_AMBULATORY_CARE_PROVIDER_SITE_OTHER): Payer: Medicare Other | Admitting: Pharmacist

## 2020-11-19 DIAGNOSIS — E785 Hyperlipidemia, unspecified: Secondary | ICD-10-CM | POA: Diagnosis not present

## 2020-11-19 DIAGNOSIS — M85851 Other specified disorders of bone density and structure, right thigh: Secondary | ICD-10-CM

## 2020-11-19 DIAGNOSIS — M5416 Radiculopathy, lumbar region: Secondary | ICD-10-CM

## 2020-11-19 DIAGNOSIS — I10 Essential (primary) hypertension: Secondary | ICD-10-CM | POA: Diagnosis not present

## 2020-11-19 DIAGNOSIS — E119 Type 2 diabetes mellitus without complications: Secondary | ICD-10-CM | POA: Diagnosis not present
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
## 2020-11-22 NOTE — Patient Instructions (Addendum)
Visit Information  Phone number for Pharmacist: [phone]  Thank you for meeting with me to discuss your medications! I look forward to working with you to achieve your health care goals. Below is a summary of what we talked about during the visit:  Goals Addressed            This Visit's Progress   . Manage My Medicine       Timeframe:  Long-Range Goal Priority:  Medium Start Date:    11/19/20                         Expected End Date:     11/19/21                  Follow Up Date 06/12/21   - call for medicine refill 2 or 3 days before it runs out - call if I am sick and can't take my medicine - keep a list of all the medicines I take; vitamins and herbals too    Why is this important?   . These steps will help you keep on track with your medicines.   Notes:       Patient Care Plan: CCM Pharmacy Care Plan    Problem Identified: Hypertension, Hyperlipidemia, Diabetes, GERD, Osteopenia and Osteoarthritis   Priority: High    Long-Range Goal: Disease management   Start Date: 11/22/2020  Expected End Date: 11/22/2021  This Visit's Progress: On track  Priority: High  Note:   Current Barriers:  . Unable to independently monitor therapeutic efficacy  Pharmacist Clinical Goal(s):  Marland Kitchen Patient will achieve adherence to monitoring guidelines and medication adherence to achieve therapeutic efficacy through collaboration with PharmD and provider.   Interventions: . 1:1 collaboration with Binnie Rail, MD regarding development and update of comprehensive plan of care as evidenced by provider attestation and co-signature . Inter-disciplinary care team collaboration (see longitudinal plan of care) . Comprehensive medication review performed; medication list updated in electronic medical record  Hypertension (BP goal <140/90) -Not ideally controlled - pt was having some SBP readings up to 160s since she was on prednisone and under stress since her dog died; more recently BP is  returning to normal -Current treatment: . Ramipril 10 mg daily AM . Metoprolol succinate 25 mg daily AM  -Medications previously tried: n/a -Current home readings: 130s-140s -Denies hypotensive/hypertensive symptoms -Educated on BP goals and benefits of medications for prevention of heart attack, stroke and kidney damage; Proper BP monitoring technique; -Counseled to monitor BP at home daily, document, and provide log at future appointments -Recommended to continue current medication  Hyperlipidemia: (LDL goal < 100) -Controlled - LDL is at goal, pt denies side effects -Current treatment: . Atorvastatin 20 mg daily . Aspirin 81 mg daily -Current exercise habits: going upstairs  -Educated on Cholesterol goals;  Benefits of statin for ASCVD risk reduction; -Recommended to continue current medication  Diabetes (A1c goal <7%) -Diet-controlled -Denies hypoglycemic/hyperglycemic symptoms -Educated on A1c and blood sugar goals; -Counseled to check feet daily and get yearly eye exams -Counseled on diet and exercise extensively  Osteopenia (Goal: prevent fractures) -Controlled -Last DEXA Scan: 12/2017  T-Score femoral neck: -1.2  T-Score lumbar spine: +0.6  10-year probability of major osteoporotic fracture: 17.3%  10-year probability of hip fracture: 3.2% -Patient is a candidate for pharmacologic treatment due to T-Score -1.0 to -2.5 and 10-year risk of hip fracture > 3% -Current treatment  . Vitamin D 5000 IU QOD -  Medications previously tried: none -Recommend [phone] units of vitamin D daily. Recommend 1200 mg of calcium daily from dietary and supplemental sources. Recommend weight-bearing and muscle strengthening exercises for building and maintaining bone density.  GERD (Goal: manage symptoms) -Controlled  -hx esophageal stretching, choking issues -Current treatment  . Pantoprazole 40 mg daily AM -Patient is satisfied with current regimen and denies issues -Recommended to  continue current medication  Pain (Goal: manage symptoms) -Controlled - pt managed with PRN Meloxicam, Aleve -osteoarthritis, lumbar radiculopathy -Current treatment  . Gabapentin 100 mg BID - stopped taking . Meloxicam 15 mg daily PRN . Aleve PRN -Counseled to avoid using meloxicam and Aleve on the same day due to risk for kidney damage, elevated BP, and GI ulcer  -Recommended to continue current medication  Health Maintenance -Vaccine gaps: none -Current therapy:  . Fluticasone nasal spray . Fluocinonide 0.01% scalp oil - not using . Gas-X . Hair, skin, nails vitamin  . Probiotic / Prebiotic  -Patient is satisfied with current therapy and denies issues -Recommended to continue current medication  Patient Goals/Self-Care Activities . Patient will:  - take medications as prescribed focus on medication adherence by routine check blood pressure daily, document, and provide at future appointments  Follow Up Plan: Telephone follow up appointment with care management team member scheduled for: 1 year      Emily Sweeney was given information about Chronic Care Management services today including:  1. CCM service includes personalized support from designated clinical staff supervised by her physician, including individualized plan of care and coordination with other care providers 2. 24/7 contact phone numbers for assistance for urgent and routine care needs. 3. Standard insurance, coinsurance, copays and deductibles apply for chronic care management only during months in which we provide at least 20 minutes of these services. Most insurances cover these services at 100%, however patients may be responsible for any copay, coinsurance and/or deductible if applicable. This service may help you avoid the need for more expensive face-to-face services. 4. Only one practitioner may furnish and bill the service in a calendar month. 5. The patient may stop CCM services at any time (effective at  the end of the month) by phone call to the office staff.  Patient agreed to services and verbal consent obtained.   Patient verbalizes understanding of instructions provided today and agrees to view in [place].  Telephone follow up appointment with pharmacy team member scheduled for: 1 year  Charlene Brooke, PharmD, [place], CPP Clinical Pharmacist [facility] [place] protect organs, store calcium, anchor muscles, and support the whole body. Keeping your bones strong is important, especially as you get older. You can take actions to help keep your bones strong and healthy. Why is keeping my bones healthy important? Keeping your bones healthy is important because your body constantly replaces bone cells. Cells get old, and new cells take their place. As we age, we lose bone cells because the body may not be able to make enough new cells to replace the old cells. The amount of bone cells and bone tissue you have is referred to as bone mass. The higher your bone mass, the stronger your bones. The aging process leads to an overall loss of bone mass in the body, which can increase the likelihood of:  Joint pain and stiffness.  Broken bones.  A condition in which the bones become weak and brittle (osteoporosis). A large decline in bone mass occurs in older adults.  In women, it occurs about the time of menopause.   What actions can I take to keep my bones healthy? Good health habits are important for maintaining healthy bones. This includes eating nutritious foods and exercising regularly. To have healthy bones, you need to get enough of the right minerals and vitamins. Most nutrition experts recommend getting these nutrients from the foods that you eat. In some cases, taking supplements may also be recommended. Doing certain types of exercise is also important for bone health. What are the nutritional recommendations for healthy bones? Eating a  well-balanced diet with plenty of calcium and vitamin D will help to protect your bones. Nutritional recommendations vary from person to person. Ask your health care provider what is healthy for you. Here are some general guidelines. Get enough calcium Calcium is the most important (essential) mineral for bone health. Most people can get enough calcium from their diet, but supplements may be recommended for people who are at risk for osteoporosis. Good sources of calcium include:  Dairy products, such as low-fat or nonfat milk, cheese, and yogurt.  Dark green leafy vegetables, such as bok choy and broccoli.  Calcium-fortified foods, such as orange juice, cereal, bread, soy beverages, and tofu products.  Nuts, such as almonds. Follow these recommended amounts for daily calcium intake:  Children, age 55-3: 700 mg.  Children, age 55-8: 1,000 mg.  Children, age 57-13: 1,300 mg.  Teens, age 26-18: 1,300 mg.  Adults, age 67-50: 1,000 mg.  Adults, age 31-70: ? Men: 1,000 mg. ? Women: 1,200 mg.  Adults, age 60 or older: 1,200 mg.  Pregnant and breastfeeding females: ? Teens: 1,300 mg. ? Adults: 1,000 mg. Get enough vitamin D Vitamin D is the most essential vitamin for bone health. It helps the body absorb calcium. Sunlight stimulates the skin to make vitamin D, so be sure to get enough sunlight. If you live in a cold climate or you do not get outside often, your health care provider may recommend that you take vitamin D supplements. Good sources of vitamin D in your diet include:  Egg yolks.  Saltwater fish.  Milk and cereal fortified with vitamin D. Follow these recommended amounts for daily vitamin D intake:  Children and teens, age 55-18: 600 international units.  Adults, age 58 or younger: 400-800 international units.  Adults, age 555 or older: 800-1,000 international units. Get other important nutrients Other nutrients that are important for bone health include:  Phosphorus.  This mineral is found in meat, poultry, dairy foods, nuts, and legumes. The recommended daily intake for adult men and adult women is 700 mg.  Magnesium. This mineral is found in seeds, nuts, dark green vegetables, and legumes. The recommended daily intake for adult men is 400-420 mg. For adult women, it is 310-320 mg.  Vitamin K. This vitamin is found in green leafy vegetables. The recommended daily intake is 120 mg for adult men and 90 mg for adult women.   What type of physical activity is best for building and maintaining healthy bones? Weight-bearing and strength-building activities are important for building and maintaining healthy bones. Weight-bearing activities cause muscles and bones to work against gravity. Strength-building activities increase the strength of the muscles that support bones. Weight-bearing and muscle-building activities include:  Walking and hiking.  Jogging and running.  Dancing.  Gym exercises.  Lifting weights.  Tennis and racquetball.  Climbing stairs.  Aerobics. Adults should get at least 30 minutes of moderate physical activity on most days. Children should  get at least 60 minutes of moderate physical activity on most days. Ask your health care provider what type of exercise is best for you.   How can I find out if my bone mass is low? Bone mass can be measured with an X-ray test called a bone mineral density (BMD) test. This test is recommended for all women who are age 65 or older. It may also be recommended for:  Men who are age 77 or older.  People who are at risk for osteoporosis because of: ? Having bones that break easily. ? Having a long-term disease that weakens bones, such as kidney disease or rheumatoid arthritis. ? Having menopause earlier than normal. ? Taking medicine that weakens bones, such as steroids, thyroid hormones, or hormone treatment for breast cancer or prostate cancer. ? Smoking. ? Drinking three or more alcoholic drinks a  day. If you find that you have a low bone mass, you may be able to prevent osteoporosis or further bone loss by changing your diet and lifestyle. Where can I find more information? For more information, check out the following websites:  [place]: AviationTales.fr  [employer] of Health: www.bones.SouthExposed.es  International Osteoporosis Foundation: Administrator.iofbonehealth.org Summary  The aging process leads to an overall loss of bone mass in the body, which can increase the likelihood of broken bones and osteoporosis.  Eating a well-balanced diet with plenty of calcium and vitamin D will help to protect your bones.  Weight-bearing and strength-building activities are also important for building and maintaining strong bones.  Bone mass can be measured with an X-ray test called a bone mineral density (BMD) test. This information is not intended to replace advice given to you by your health care provider. Make sure you discuss any questions you have with your health care provider. Document Revised: 08/27/2017 Document Reviewed: 08/27/2017 Elsevier Patient Education  2021 [employer].

## 2020-12-17 ENCOUNTER — Encounter: Payer: Self-pay | Admitting: Internal Medicine

## 2020-12-19 MED ORDER — HYDROCHLOROTHIAZIDE 25 MG PO TABS: 25.0000 mg | ORAL_TABLET | Freq: Every day | ORAL | 1 refills | Status: DC

## 2020-12-19 NOTE — Addendum Note (Signed)
Addended by: Binnie Rail on: 12/19/2020 01:26 PM   Modules accepted: Orders

## 2020-12-20 ENCOUNTER — Other Ambulatory Visit (INDEPENDENT_AMBULATORY_CARE_PROVIDER_SITE_OTHER): Payer: Medicare Other

## 2020-12-20 DIAGNOSIS — E785 Hyperlipidemia, unspecified: Secondary | ICD-10-CM | POA: Diagnosis not present

## 2020-12-20 DIAGNOSIS — E119 Type 2 diabetes mellitus without complications: Secondary | ICD-10-CM | POA: Diagnosis not present

## 2020-12-20 DIAGNOSIS — E538 Deficiency of other specified B group vitamins: Secondary | ICD-10-CM | POA: Diagnosis not present

## 2020-12-20 DIAGNOSIS — E559 Vitamin D deficiency, unspecified: Secondary | ICD-10-CM

## 2020-12-20 DIAGNOSIS — I10 Essential (primary) hypertension: Secondary | ICD-10-CM | POA: Diagnosis not present

## 2020-12-20 LAB — COMPREHENSIVE METABOLIC PANEL
ALT: 10 U/L (ref 0–35)
AST: 17 U/L (ref 0–37)
Albumin: 4.2 g/dL (ref 3.5–5.2)
Alkaline Phosphatase: 69 U/L (ref 39–117)
BUN: 13 mg/dL (ref 6–23)
CO2: 25 mEq/L (ref 19–32)
Calcium: 9.5 mg/dL (ref 8.4–10.5)
Chloride: 103 mEq/L (ref 96–112)
Creatinine, Ser: 0.92 mg/dL (ref 0.40–1.20)
GFR: 58.31 mL/min — ABNORMAL LOW (ref >60.00)
Glucose, Bld: 133 mg/dL — ABNORMAL HIGH (ref 70–99)
Potassium: 3.9 mEq/L (ref 3.5–5.1)
Sodium: 139 mEq/L (ref 135–145)
Total Bilirubin: 0.7 mg/dL (ref 0.2–1.2)
Total Protein: 6.9 g/dL (ref 6.0–8.3)

## 2020-12-20 LAB — CBC WITH DIFFERENTIAL/PLATELET
Basophils Absolute: 0 10*3/uL (ref 0.0–0.1)
Basophils Relative: 0.7 % (ref 0.0–3.0)
Eosinophils Absolute: 0.1 10*3/uL (ref 0.0–0.7)
Eosinophils Relative: 2 % (ref 0.0–5.0)
HCT: 35.9 % — ABNORMAL LOW (ref 36.0–46.0)
Hemoglobin: 11.4 g/dL — ABNORMAL LOW (ref 12.0–15.0)
Lymphocytes Relative: 22 % (ref 12.0–46.0)
Lymphs Abs: 1.5 10*3/uL (ref 0.7–4.0)
MCHC: 31.8 g/dL (ref 30.0–36.0)
MCV: 80.1 fl (ref 78.0–100.0)
Monocytes Absolute: 0.7 10*3/uL (ref 0.1–1.0)
Monocytes Relative: 10.5 % (ref 3.0–12.0)
Neutro Abs: 4.6 10*3/uL (ref 1.4–7.7)
Neutrophils Relative %: 64.8 % (ref 43.0–77.0)
Platelets: 192 10*3/uL (ref 150.0–400.0)
RBC: 4.48 Mil/uL (ref 3.87–5.11)
RDW: 19.6 % — ABNORMAL HIGH (ref 11.5–15.5)
WBC: 7 10*3/uL (ref 4.0–10.5)

## 2020-12-20 LAB — LIPID PANEL
Cholesterol: 178 mg/dL (ref 0–200)
HDL: 84.5 mg/dL (ref >39.00)
LDL Cholesterol: 80 mg/dL (ref 0–99)
NonHDL: 93.32
Total CHOL/HDL Ratio: 2
Triglycerides: 66 mg/dL (ref 0.0–149.0)
VLDL: 13.2 mg/dL (ref 0.0–40.0)

## 2020-12-20 LAB — VITAMIN B12: Vitamin B-12: 134 pg/mL — ABNORMAL LOW (ref 211–911)

## 2020-12-20 LAB — VITAMIN D 25 HYDROXY (VIT D DEFICIENCY, FRACTURES): VITD: 57.17 ng/mL (ref 30.00–100.00)

## 2020-12-20 LAB — HEMOGLOBIN A1C: Hgb A1c MFr Bld: 6.9 % — ABNORMAL HIGH (ref 4.6–6.5)

## 2020-12-24 ENCOUNTER — Other Ambulatory Visit: Payer: Self-pay | Admitting: Internal Medicine

## 2020-12-24 MED ORDER — HYDROCHLOROTHIAZIDE 25 MG PO TABS
12.5000 mg | ORAL_TABLET | Freq: Every day | ORAL | 1 refills | Status: DC
Start: 2020-12-24 — End: 2021-03-16

## 2021-01-26 ENCOUNTER — Telehealth: Payer: Self-pay | Admitting: Internal Medicine

## 2021-01-26 NOTE — Telephone Encounter (Signed)
LVM for pt to rtn my call to schedule AWV with [facility]. Please schedule this appt if pt calls the office.  °

## 2021-01-28 ENCOUNTER — Other Ambulatory Visit: Payer: Self-pay

## 2021-01-31 ENCOUNTER — Ambulatory Visit (INDEPENDENT_AMBULATORY_CARE_PROVIDER_SITE_OTHER): Payer: Medicare Other | Admitting: Podiatry

## 2021-01-31 ENCOUNTER — Other Ambulatory Visit: Payer: Self-pay

## 2021-01-31 ENCOUNTER — Encounter: Payer: Self-pay | Admitting: Podiatry

## 2021-01-31 DIAGNOSIS — M76829 Posterior tibial tendinitis, unspecified leg: Secondary | ICD-10-CM | POA: Diagnosis not present

## 2021-01-31 DIAGNOSIS — L84 Corns and callosities: Secondary | ICD-10-CM | POA: Diagnosis not present

## 2021-01-31 DIAGNOSIS — M216X2 Other acquired deformities of left foot: Secondary | ICD-10-CM

## 2021-01-31 DIAGNOSIS — M2010 Hallux valgus (acquired), unspecified foot: Secondary | ICD-10-CM

## 2021-01-31 DIAGNOSIS — M216X9 Other acquired deformities of unspecified foot: Secondary | ICD-10-CM | POA: Insufficient documentation

## 2021-01-31 NOTE — Progress Notes (Signed)
This patient presents the office with chief complaint of painful callus on the bottom of her left foot.  She says the calluses have been painful and have limited her walking.  She was last seen in the office in 2018.  She presents the office today for an evaluation and treatment of her painful callus left foot.  Vascular  Dorsalis pedis and posterior tibial pulses are palpable  B/L.  Capillary return  WNL.  Temperature gradient is  WNL.  Skin turgor  WNL  Sensorium  Senn Weinstein monofilament wire  WNL. Normal tactile sensation.  Nail Exam  Patient has normal nails with no evidence of bacterial or fungal infection.  Orthopedic  Exam  Muscle tone and muscle strength  WNL.  No limitations of motion feet  B/L.  No crepitus or joint effusion noted.  Foot type is unremarkable and digits show no abnormalities.  Bony prominences are unremarkable. Pes planus.  Severe  HAV  B/L.  PTTD left foot.  Plantar flexed fifth metatarsal left foot.  Skin  No open lesions.  Normal skin texture and turgor.  Callus sub 1,5 left foot.  Callus left foot.  IE   Debride callus with # 15 blade.  Discussed off weight bearing these two painful callus by the pedorthist.  She is to make an appointment with [facility] upon leaving today. RTC 3 months for preventative foot care services.   Gardiner Barefoot DPM

## 2021-02-02 ENCOUNTER — Other Ambulatory Visit: Payer: Self-pay

## 2021-02-02 ENCOUNTER — Ambulatory Visit (INDEPENDENT_AMBULATORY_CARE_PROVIDER_SITE_OTHER): Payer: Medicare Other | Admitting: Podiatry

## 2021-02-02 DIAGNOSIS — M2010 Hallux valgus (acquired), unspecified foot: Secondary | ICD-10-CM

## 2021-02-02 DIAGNOSIS — M76829 Posterior tibial tendinitis, unspecified leg: Secondary | ICD-10-CM

## 2021-02-02 DIAGNOSIS — L84 Corns and callosities: Secondary | ICD-10-CM

## 2021-02-02 DIAGNOSIS — M216X2 Other acquired deformities of left foot: Secondary | ICD-10-CM

## 2021-02-02 NOTE — Progress Notes (Signed)
Patient presents to be casted for orthotics.  A foam impression was casted for her right and left foot  Patient is a size 7 1/2  Patient will be contacted when the orthotics are ready for pick up

## 2021-02-21 ENCOUNTER — Ambulatory Visit (INDEPENDENT_AMBULATORY_CARE_PROVIDER_SITE_OTHER): Payer: Medicare Other

## 2021-02-21 DIAGNOSIS — Z0000 Encounter for general adult medical examination without abnormal findings: Secondary | ICD-10-CM

## 2021-02-21 NOTE — Patient Instructions (Addendum)
Emily Sweeney , Thank you for taking time to come for your Medicare Wellness Visit. I appreciate your ongoing commitment to your health goals. Please review the following plan we discussed and let me know if I can assist you in the future.   Screening recommendations/referrals: Colonoscopy: not a candidate for colon cancer screening due to age 80: not a candidate for breast cancer screening due to age Bone Density: last done 12/17/2017; due every 2 years Recommended yearly ophthalmology/optometry visit for glaucoma screening and checkup Recommended yearly dental visit for hygiene and checkup  Vaccinations: Influenza vaccine: 05/28/2020 Pneumococcal vaccine: 10/16/2014, 10/14/2015 Tdap vaccine: 05/25/2017; due every 10 years Shingles vaccine: Please call your insurance company to determine your out of pocket expense for the Shingrix vaccine. You may receive this vaccine at your local pharmacy.   Covid-19: 08/27/2019, 09/16/2019, 05/18/2020  Advanced directives: Advance directive discussed with you today. I have provided a copy for you to complete at home and have notarized. Once this is complete please bring a copy in to our office so we can scan it into your chart.  Conditions/risks identified: Yes.  Goals:  Recommend to drink at least 6-8 8oz glasses of water per day.   Recommend to exercise for at least 150 minutes per week.   Recommend to remove any items from the home that may cause slips or trips.   Recommend to decrease portion sizes by eating 3 small healthy meals and at least 2 healthy snacks per day.   Recommend to begin DASH diet as directed below  Next appointment: Please schedule your next Medicare Wellness Visit with your Nurse Health Advisor in 1 year by calling [phone].   Preventive Care 68 Years and Older, Female Preventive care refers to lifestyle choices and visits with your health care provider that can promote health and wellness. What does preventive care  include? A yearly physical exam. This is also called an annual well check. Dental exams once or twice a year. Routine eye exams. Ask your health care provider how often you should have your eyes checked. Personal lifestyle choices, including: Daily care of your teeth and gums. Regular physical activity. Eating a healthy diet. Avoiding tobacco and drug use. Limiting alcohol use. Practicing safe sex. Taking low-dose aspirin every day. Taking vitamin and mineral supplements as recommended by your health care provider. What happens during an annual well check? The services and screenings done by your health care provider during your annual well check will depend on your age, overall health, lifestyle risk factors, and family history of disease. Counseling  Your health care provider may ask you questions about your: Alcohol use. Tobacco use. Drug use. Emotional well-being. Home and relationship well-being. Sexual activity. Eating habits. History of falls. Memory and ability to understand (cognition). Work and work Statistician. Reproductive health. Screening  You may have the following tests or measurements: Height, weight, and BMI. Blood pressure. Lipid and cholesterol levels. These may be checked every 5 years, or more frequently if you are over 83 years old. Skin check. Lung cancer screening. You may have this screening every year starting at age 13 if you have a 30-pack-year history of smoking and currently smoke or have quit within the past 15 years. Fecal occult blood test (FOBT) of the stool. You may have this test every year starting at age 45. Flexible sigmoidoscopy or colonoscopy. You may have a sigmoidoscopy every 5 years or a colonoscopy every 10 years starting at age 36. Hepatitis C blood test. Hepatitis B  blood test. Sexually transmitted disease (STD) testing. Diabetes screening. This is done by checking your blood sugar (glucose) after you have not eaten for a while  (fasting). You may have this done every 1-3 years. Bone density scan. This is done to screen for osteoporosis. You may have this done starting at age 88. Mammogram. This may be done every 1-2 years. Talk to your health care provider about how often you should have regular mammograms. Talk with your health care provider about your test results, treatment options, and if necessary, the need for more tests. Vaccines  Your health care provider may recommend certain vaccines, such as: Influenza vaccine. This is recommended every year. Tetanus, diphtheria, and acellular pertussis (Tdap, Td) vaccine. You may need a Td booster every 10 years. Zoster vaccine. You may need this after age 27. Pneumococcal 13-valent conjugate (PCV13) vaccine. One dose is recommended after age 40. Pneumococcal polysaccharide (PPSV23) vaccine. One dose is recommended after age 68. Talk to your health care provider about which screenings and vaccines you need and how often you need them. This information is not intended to replace advice given to you by your health care provider. Make sure you discuss any questions you have with your health care provider. Document Released: 08/27/2015 Document Revised: 04/19/2016 Document Reviewed: 06/01/2015 Elsevier Interactive Patient Education  2017 [place] Prevention in the Home Falls can cause injuries. They can happen to people of all ages. There are many things you can do to make your home safe and to help prevent falls. What can I do on the outside of my home? Regularly fix the edges of walkways and driveways and fix any cracks. Remove anything that might make you trip as you walk through a door, such as a raised step or threshold. Trim any bushes or trees on the path to your home. Use bright outdoor lighting. Clear any walking paths of anything that might make someone trip, such as rocks or tools. Regularly check to see if handrails are loose or broken. Make sure that  both sides of any steps have handrails. Any raised decks and porches should have guardrails on the edges. Have any leaves, snow, or ice cleared regularly. Use sand or salt on walking paths during winter. Clean up any spills in your garage right away. This includes oil or grease spills. What can I do in the bathroom? Use night lights. Install grab bars by the toilet and in the tub and shower. Do not use towel bars as grab bars. Use non-skid mats or decals in the tub or shower. If you need to sit down in the shower, use a plastic, non-slip stool. Keep the floor dry. Clean up any water that spills on the floor as soon as it happens. Remove soap buildup in the tub or shower regularly. Attach bath mats securely with double-sided non-slip rug tape. Do not have throw rugs and other things on the floor that can make you trip. What can I do in the bedroom? Use night lights. Make sure that you have a light by your bed that is easy to reach. Do not use any sheets or blankets that are too big for your bed. They should not hang down onto the floor. Have a firm chair that has side arms. You can use this for support while you get dressed. Do not have throw rugs and other things on the floor that can make you trip. What can I do in the kitchen? Clean up any spills  right away. Avoid walking on wet floors. Keep items that you use a lot in easy-to-reach places. If you need to reach something above you, use a strong step stool that has a grab bar. Keep electrical cords out of the way. Do not use floor polish or wax that makes floors slippery. If you must use wax, use non-skid floor wax. Do not have throw rugs and other things on the floor that can make you trip. What can I do with my stairs? Do not leave any items on the stairs. Make sure that there are handrails on both sides of the stairs and use them. Fix handrails that are broken or loose. Make sure that handrails are as long as the stairways. Check  any carpeting to make sure that it is firmly attached to the stairs. Fix any carpet that is loose or worn. Avoid having throw rugs at the top or bottom of the stairs. If you do have throw rugs, attach them to the floor with carpet tape. Make sure that you have a light switch at the top of the stairs and the bottom of the stairs. If you do not have them, ask someone to add them for you. What else can I do to help prevent falls? Wear shoes that: Do not have high heels. Have rubber bottoms. Are comfortable and fit you well. Are closed at the toe. Do not wear sandals. If you use a stepladder: Make sure that it is fully opened. Do not climb a closed stepladder. Make sure that both sides of the stepladder are locked into place. Ask someone to hold it for you, if possible. Clearly mark and make sure that you can see: Any grab bars or handrails. First and last steps. Where the edge of each step is. Use tools that help you move around (mobility aids) if they are needed. These include: Canes. Walkers. Scooters. Crutches. Turn on the lights when you go into a dark area. Replace any light bulbs as soon as they burn out. Set up your furniture so you have a clear path. Avoid moving your furniture around. If any of your floors are uneven, fix them. If there are any pets around you, be aware of where they are. Review your medicines with your doctor. Some medicines can make you feel dizzy. This can increase your chance of falling. Ask your doctor what other things that you can do to help prevent falls. This information is not intended to replace advice given to you by your health care provider. Make sure you discuss any questions you have with your health care provider. Document Released: 05/27/2009 Document Revised: 01/06/2016 Document Reviewed: 09/04/2014 Elsevier Interactive Patient Education  2017 [employer].

## 2021-02-21 NOTE — Progress Notes (Signed)
I connected with [name] today by a video enabled telemedicine application and verified that I am speaking with the correct person using two identifiers. Location patient: home Location provider: work Persons participating in the virtual visit: Aryanne Gilleland and Lisette Abu, LPN.   I discussed the limitations, risks, security and privacy concerns of performing an evaluation and management service by telephone and the availability of in person appointments. I also discussed with the patient that there may be a patient responsible charge related to this service. The patient expressed understanding and agreed to proceed. Subjective:   Emily Sweeney is a 80 y.o. female who presents for Medicare Annual (Subsequent) preventive examination.  Review of Systems     Cardiac Risk Factors include: advanced age (>82men, >20 women);dyslipidemia;family history of premature cardiovascular disease;hypertension     Objective:    Today's Vitals   02/21/21 0836  PainSc: 3    There is no height or weight on file to calculate BMI.  Advanced Directives 02/21/2021 12/17/2019 12/16/2018 06/13/2018 12/14/2017 12/07/2016 08/11/2016  Does Patient Have a Medical Advance Directive? No No Yes Yes No Yes Yes  Type of Advance Directive - - Living will Living will - [place];Living will [place];Living will  Does patient want to make changes to medical advance directive? No - Patient declined - - - - - -  Copy of Press photographer in Chart? - - - - - No - copy requested -  Would patient like information on creating a medical advance directive? - No - Patient declined - - Yes (ED - Information included in AVS) - -    Current Medications (verified) Outpatient Encounter Medications as of 02/21/2021  Medication Sig   aspirin EC 81 MG tablet Take 1 tablet (81 mg total) by mouth daily.   atorvastatin (LIPITOR) 20 MG tablet Take 1 tablet (20 mg total) by mouth  daily.   fluticasone (FLONASE) 50 MCG/ACT nasal spray Place 1 spray into both nostrils as needed for allergies or rhinitis.   hydrochlorothiazide (HYDRODIURIL) 25 MG tablet Take 0.5 tablets (12.5 mg total) by mouth daily.   meloxicam (MOBIC) 15 MG tablet Take 1 tablet (15 mg total) by mouth daily.   metoprolol succinate (TOPROL-XL) 25 MG 24 hr tablet Take 1 tablet (25 mg total) by mouth daily.   Multiple Vitamins-Minerals (HAIR SKIN AND NAILS FORMULA) TABS Take 1 tablet by mouth daily.   pantoprazole (PROTONIX) 40 MG tablet Take 1 tablet (40 mg total) by mouth daily.   predniSONE (DELTASONE) 5 MG tablet    Probiotic Product (PROBIOTIC ADVANCED PO) Take 1 capsule by mouth daily.   ramipril (ALTACE) 10 MG capsule Take 1 capsule (10 mg total) by mouth daily.   simethicone (MYLICON) 80 MG chewable tablet Chew 80 mg by mouth every 6 (six) hours as needed for flatulence.   VITAMIN D, CHOLECALCIFEROL, PO Take 5,000 Units by mouth daily.    gabapentin (NEURONTIN) 100 MG capsule Take 1 capsule (100 mg total) by mouth 2 (two) times daily. (Patient not taking: Reported on 11/19/2020)   No facility-administered encounter medications on file as of 02/21/2021.    Allergies (verified) Macrobid [nitrofurantoin macrocrystal], Norvasc [amlodipine besylate], Ceftin [cefuroxime axetil], Ciprofloxacin, Penicillins, and Sulfonamide derivatives   History: Past Medical History:  Diagnosis Date   Anemia    Blood transfusion without reported diagnosis    Diverticulosis of colon    GERD (gastroesophageal reflux disease)    Hearing loss    right  ear, and tinnitus   Hyperlipidemia    Hypertension    Tubular adenoma 03/05/2015   Polyp, and Benign lymphoid polyp.   UTI (urinary tract infection)    Past Surgical History:  Procedure Laterality Date   APPENDECTOMY     BREAST BIOPSY     benign   CATARACT EXTRACTION, BILATERAL  2019, 2020   COCHLEAR IMPLANT     CRANIECTOMY FOR EXCISION OF ACOUSTIC NEUROMA  2009    NASAL SEPTUM SURGERY     TONSILLECTOMY     UPPER GASTROINTESTINAL ENDOSCOPY  06/02/2020   Family History  Problem Relation Age of Onset   Sudden death Father 87       MI   Coronary artery disease Father    Heart disease Father    Dementia Mother 85   Alcohol abuse Brother    Colon cancer Neg Hx    Esophageal cancer Neg Hx    Social History   Socioeconomic History   Marital status: Divorced    Spouse name: Not on file   Number of children: Not on file   Years of education: Not on file   Highest education level: Not on file  Occupational History   Occupation: psychologist  Tobacco Use   Smoking status: Former    Pack years: 0.00    Types: Cigarettes    Quit date: 07/04/1991    Years since quitting: 29.6   Smokeless tobacco: Never  Vaping Use   Vaping Use: Never used  Substance and Sexual Activity   Alcohol use: Yes    Alcohol/week: 0.0 standard drinks    Comment: glass wine 4-5 days a week   Drug use: No   Sexual activity: Not Currently  Other Topics Concern   Not on file  Social History Narrative   Not on file   Social Determinants of Health   Financial Resource Strain: Low Risk    Difficulty of Paying Living Expenses: Not hard at all  Food Insecurity: No Food Insecurity   Worried About Charity fundraiser in the Last Year: Never true   [place] in the Last Year: Never true  Transportation Needs: No Transportation Needs   Lack of Transportation (Medical): No   Lack of Transportation (Non-Medical): No  Physical Activity: Sufficiently Active   Days of Exercise per Week: 5 days   Minutes of Exercise per Session: 30 min  Stress: No Stress Concern Present   Feeling of Stress : Not at all  Social Connections: Socially Isolated   Frequency of Communication with Friends and Family: More than three times a week   Frequency of Social Gatherings with Friends and Family: Three times a week   Attends Religious Services: Never   Active Member of Clubs or  Organizations: No   Attends Music therapist: Never   Marital Status: Divorced    Tobacco Counseling Counseling given: Not Answered   Clinical Intake:  Pre-visit preparation completed: Yes  Pain : 0-10 Pain Score: 3  Pain Type: Acute pain Pain Location: Abdomen Pain Orientation: Right (RUQ pain) Pain Radiating Towards: n/a Pain Descriptors / Indicators: Discomfort, Pressure Pain Onset: 1 to 4 weeks ago Pain Frequency: Intermittent Pain Relieving Factors: Gas-X  Pain Relieving Factors: Gas-X  Nutritional Risks: None Diabetes: No  How often do you need to have someone help you when you read instructions, pamphlets, or other written materials from your doctor or pharmacy?: 1 - Never What is the last grade level you completed in  school?: Master's Degree  Diabetic? no  Interpreter Needed?: No  Information entered by :: Lisette Abu, LPN   Activities of Daily Living In your present state of health, do you have any difficulty performing the following activities: 02/21/2021  Hearing? Y  Vision? N  Difficulty concentrating or making decisions? N  Walking or climbing stairs? N  Dressing or bathing? N  Doing errands, shopping? N  Preparing Food and eating ? N  Using the Toilet? N  In the past six months, have you accidently leaked urine? N  Do you have problems with loss of bowel control? N  Managing your Medications? N  Managing your Finances? N  Housekeeping or managing your Housekeeping? N  Some recent data might be hidden    Patient Care Team: Binnie Rail, MD as PCP - General (Internal Medicine) Katy Apo, MD (Ophthalmology) Sanjuana Kava, MD as Referring Physician (Otolaryngology) Tat, Eustace Quail, DO as Consulting Physician (Neurology) Charlton Haws, [facility] as Pharmacist (Pharmacist)  Indicate any recent Medical Services you may have received from other than [facility] providers in the past year (date may be approximate).      Assessment:   This is a routine wellness examination for Emily Sweeney.  Hearing/Vision screen Hearing Screening - Comments:: Patient has a right ear cochlear implant. Vision Screening - Comments:: Patient wears glasses.  Eye exams done once a year by Dr. Katy Apo.  Dietary issues and exercise activities discussed: Current Exercise Habits: Home exercise routine (Patient still works part time), Type of exercise: walking, Time (Minutes): 30, Frequency (Times/Week): 5, Weekly Exercise (Minutes/Week): 150, Intensity: Moderate, Exercise limited by: None identified   Goals Addressed             This Visit's Progress    Patient Stated       Continue to be as healthy and as independent as possible. I will continue to walk and be physically active, enjoy life and family and love my dog.        Depression Screen PHQ 2/9 Scores 02/21/2021 12/17/2019 12/16/2018 12/14/2017 12/07/2016 01/19/2016 10/17/2015  PHQ - 2 Score 0 0 0 0 0 0 0  PHQ- 9 Score - - - 0 0 - -    Fall Risk Fall Risk  02/21/2021 12/17/2019 12/16/2018 09/02/2018 12/14/2017  Falls in the past year? 0 0 1 1 Yes  Number falls in past yr: 0 0 0 1 1  Injury with Fall? 0 0 1 1 Yes  Risk Factor Category  - - - - High Fall Risk  Risk for fall due to : No Fall Risks No Fall Risks Impaired balance/gait - Impaired mobility;Impaired balance/gait  Follow up Falls evaluation completed Falls evaluation completed;Education provided - - Falls prevention discussed    FALL RISK PREVENTION PERTAINING TO THE HOME:  Any stairs in or around the home? Yes  If so, are there any without handrails? No  Home free of loose throw rugs in walkways, pet beds, electrical cords, etc? Yes  Adequate lighting in your home to reduce risk of falls? Yes   ASSISTIVE DEVICES UTILIZED TO PREVENT FALLS:  Life alert? No  Use of a cane, walker or w/c? No  Grab bars in the bathroom? Yes  Shower chair or bench in shower? No  Elevated toilet seat or a handicapped toilet? No    TIMED UP AND GO:  Was the test performed? No .  Length of time to ambulate 10 feet: 0 sec.   Gait steady and  fast without use of assistive device (per patient)  Cognitive Function: MMSE - Mini Mental State Exam 12/14/2017  Not completed: Refused        Immunizations Immunization History  Administered Date(s) Administered   Fluad Quad(high Dose 65+) 06/06/2019, 05/28/2020   Influenza Split 07/04/2011, 09/15/2011   Influenza Whole 05/14/2008, 06/10/2010   Influenza, High Dose Seasonal PF 06/18/2013, 05/02/2016, 05/25/2017, 07/04/2018   Influenza,inj,Quad PF,6+ Mos 09/23/2014, 06/09/2015   PFIZER(Purple Top)SARS-COV-2 Vaccination 08/27/2019, 09/16/2019, 05/18/2020   Pneumococcal Conjugate-13 10/16/2014   Pneumococcal Polysaccharide-23 10/21/2008, 10/14/2015   Td 11/13/2006   Tdap 05/25/2017   Zoster, Live 12/28/2006    TDAP status: Up to date  Flu Vaccine status: Up to date  Pneumococcal vaccine status: Up to date  Covid-19 vaccine status: Completed vaccines  Qualifies for Shingles Vaccine? Yes   Zostavax completed Yes   Shingrix Completed?: No.    Education has been provided regarding the importance of this vaccine. Patient has been advised to call insurance company to determine out of pocket expense if they have not yet received this vaccine. Advised may also receive vaccine at local pharmacy or Health Dept. Verbalized acceptance and understanding.  Screening Tests Health Maintenance  Topic Date Due   Zoster Vaccines- Shingrix (1 of 2) Never done   OPHTHALMOLOGY EXAM  06/01/2019   DEXA SCAN  12/18/2019   COLONOSCOPY (Pts 45-29yrs Insurance coverage will need to be confirmed)  03/07/2020   COVID-19 Vaccine (4 - Booster for Pfizer series) 08/18/2020   INFLUENZA VACCINE  03/14/2021   HEMOGLOBIN A1C  06/22/2021   FOOT EXAM  01/31/2022   TETANUS/TDAP  05/26/2027   PNA vac Low Risk Adult  Completed   HPV VACCINES  Aged Out    Health Maintenance  Health  Maintenance Due  Topic Date Due   Zoster Vaccines- Shingrix (1 of 2) Never done   OPHTHALMOLOGY EXAM  06/01/2019   DEXA SCAN  12/18/2019   COLONOSCOPY (Pts 45-68yrs Insurance coverage will need to be confirmed)  03/07/2020   COVID-19 Vaccine (4 - Booster for [place] series) 08/18/2020    Colorectal cancer screening: No longer required.   Mammogram status: No longer required due to age.  Bone Density status: Completed 12/17/2017. Results reflect: Bone density results: OSTEOPENIA. Repeat every 2 years.  Lung Cancer Screening: (Low Dose CT Chest recommended if Age 87-80 years, 30 pack-year currently smoking OR have quit w/in 15years.) does not qualify.   Lung Cancer Screening Referral: no  Additional Screening:  Hepatitis C Screening: does not qualify; Completed no  Vision Screening: Recommended annual ophthalmology exams for early detection of glaucoma and other disorders of the eye. Is the patient up to date with their annual eye exam?  Yes  Who is the provider or what is the name of the office in which the patient attends annual eye exams? Katy Apo, MD. If pt is not established with a provider, would they like to be referred to a provider to establish care? No .   Dental Screening: Recommended annual dental exams for proper oral hygiene  Community Resource Referral / Chronic Care Management: CRR required this visit?  No   CCM required this visit?  No      Plan:     I have personally reviewed and noted the following in the patient's chart:   Medical and social history Use of alcohol, tobacco or illicit drugs  Current medications and supplements including opioid prescriptions.  Functional ability and status Nutritional status Physical activity Advanced directives  List of other physicians Hospitalizations, surgeries, and ER visits in previous 12 months Vitals Screenings to include cognitive, depression, and falls Referrals and appointments  In addition, I have  reviewed and discussed with patient certain preventive protocols, quality metrics, and best practice recommendations. A written personalized care plan for preventive services as well as general preventive health recommendations were provided to patient.     Sheral Flow, LPN   1/79/1505   Nurse Notes:  Patient is cogitatively intact. There were no vitals filed for this visit. There is no height or weight on file to calculate BMI. Patient stated that she has no issues with gait or balance; does not use any assistive devices. Medications reviewed with patient; no opioid use noted.

## 2021-02-22 ENCOUNTER — Telehealth: Payer: Self-pay | Admitting: Podiatry

## 2021-02-22 NOTE — Telephone Encounter (Signed)
Called pt to see what type of shoe she is going to put the diabetic inserts in that she has ordered. We have received the needed documents. She has an easy spirit tennis shoe she is planning on using.

## 2021-02-24 ENCOUNTER — Telehealth: Payer: Self-pay | Admitting: Pharmacist

## 2021-02-24 NOTE — Progress Notes (Signed)
    Chronic Care Management Pharmacy Assistant   Name: Jeweline Reif  MRN: [id number] DOB: 10-28-1940    Reason for Encounter: Disease State   Conditions to be addressed/monitored: General Call   Recent office visits:  None ID  Recent consult visits:  01/31/21, 02/02/21 Gardiner Barefoot DPM Podiatry (Callus on foot)  Hospital visits:  None in previous 6 months  Medications: Outpatient Encounter Medications as of 02/24/2021  Medication Sig   aspirin EC 81 MG tablet Take 1 tablet (81 mg total) by mouth daily.   atorvastatin (LIPITOR) 20 MG tablet Take 1 tablet (20 mg total) by mouth daily.   fluticasone (FLONASE) 50 MCG/ACT nasal spray Place 1 spray into both nostrils as needed for allergies or rhinitis.   gabapentin (NEURONTIN) 100 MG capsule Take 1 capsule (100 mg total) by mouth 2 (two) times daily. (Patient not taking: Reported on 11/19/2020)   hydrochlorothiazide (HYDRODIURIL) 25 MG tablet Take 0.5 tablets (12.5 mg total) by mouth daily.   meloxicam (MOBIC) 15 MG tablet Take 1 tablet (15 mg total) by mouth daily.   metoprolol succinate (TOPROL-XL) 25 MG 24 hr tablet Take 1 tablet (25 mg total) by mouth daily.   Multiple Vitamins-Minerals (HAIR SKIN AND NAILS FORMULA) TABS Take 1 tablet by mouth daily.   pantoprazole (PROTONIX) 40 MG tablet Take 1 tablet (40 mg total) by mouth daily.   predniSONE (DELTASONE) 5 MG tablet    Probiotic Product (PROBIOTIC ADVANCED PO) Take 1 capsule by mouth daily.   ramipril (ALTACE) 10 MG capsule Take 1 capsule (10 mg total) by mouth daily.   simethicone (MYLICON) 80 MG chewable tablet Chew 80 mg by mouth every 6 (six) hours as needed for flatulence.   VITAMIN D, CHOLECALCIFEROL, PO Take 5,000 Units by mouth daily.    No facility-administered encounter medications on file as of 02/24/2021.    Pharmacist Review  Have you had any problems recently with your health? Patient states that she has been felling well. She states that the only issue she  is having is callus on her foot. She states that her foot is still tender and thinks she needs to go back and see the podiatrist. Also she stated that from time to time she has a mild pain under her rib that usually happens around lunch time. She said that after it happens sometimes she gets weak for about 30 minutes  Have you had any problems with your pharmacy? Patient states that she does not have any problems with getting medications from the pharmacy or the cost of medications  What issues or side effects are you having with your medications? Patient states that she does not have any side effects from medications  What would you like me to pass along to [facility], C[facility] for them to help you with?  Patient states that her concern is her foot still being tender and the pain under her ribs, not sure why that is.  What can we do to take care of you better?  Patient states that she just appreciate the phone call  Star Rating Drugs: Atorvastatin 11/29/20 90 ds Ramipril 01/23/21 90 ds  Ethelene Hal Clinical Pharmacist Assistant [phone]   Time spent:30

## 2021-02-26 ENCOUNTER — Other Ambulatory Visit: Payer: Self-pay | Admitting: Internal Medicine

## 2021-03-13 ENCOUNTER — Other Ambulatory Visit: Payer: Self-pay

## 2021-03-13 ENCOUNTER — Inpatient Hospital Stay (HOSPITAL_COMMUNITY)
Admission: EM | Admit: 2021-03-13 | Discharge: 2021-03-16 | DRG: 641 | Disposition: A | Discharge status: Home / Self Care | Payer: Medicare Other | Attending: Family Medicine | Admitting: Family Medicine

## 2021-03-13 ENCOUNTER — Emergency Department (HOSPITAL_COMMUNITY): Payer: Medicare Other

## 2021-03-13 ENCOUNTER — Encounter (HOSPITAL_COMMUNITY): Payer: Self-pay | Admitting: Emergency Medicine

## 2021-03-13 DIAGNOSIS — Z9049 Acquired absence of other specified parts of digestive tract: Secondary | ICD-10-CM

## 2021-03-13 DIAGNOSIS — Z20822 Contact with and (suspected) exposure to covid-19: Secondary | ICD-10-CM | POA: Diagnosis present

## 2021-03-13 DIAGNOSIS — E538 Deficiency of other specified B group vitamins: Secondary | ICD-10-CM | POA: Diagnosis not present

## 2021-03-13 DIAGNOSIS — Z888 Allergy status to other drugs, medicaments and biological substances status: Secondary | ICD-10-CM

## 2021-03-13 DIAGNOSIS — R262 Difficulty in walking, not elsewhere classified: Secondary | ICD-10-CM | POA: Diagnosis present

## 2021-03-13 DIAGNOSIS — I10 Essential (primary) hypertension: Secondary | ICD-10-CM | POA: Diagnosis present

## 2021-03-13 DIAGNOSIS — R0602 Shortness of breath: Secondary | ICD-10-CM | POA: Diagnosis not present

## 2021-03-13 DIAGNOSIS — R079 Chest pain, unspecified: Secondary | ICD-10-CM

## 2021-03-13 DIAGNOSIS — I959 Hypotension, unspecified: Secondary | ICD-10-CM | POA: Diagnosis present

## 2021-03-13 DIAGNOSIS — Z881 Allergy status to other antibiotic agents status: Secondary | ICD-10-CM

## 2021-03-13 DIAGNOSIS — M5416 Radiculopathy, lumbar region: Secondary | ICD-10-CM | POA: Diagnosis present

## 2021-03-13 DIAGNOSIS — Z880 Allergy status to penicillin: Secondary | ICD-10-CM

## 2021-03-13 DIAGNOSIS — R0789 Other chest pain: Secondary | ICD-10-CM | POA: Diagnosis not present

## 2021-03-13 DIAGNOSIS — R42 Dizziness and giddiness: Secondary | ICD-10-CM | POA: Diagnosis not present

## 2021-03-13 DIAGNOSIS — E1140 Type 2 diabetes mellitus with diabetic neuropathy, unspecified: Secondary | ICD-10-CM | POA: Diagnosis present

## 2021-03-13 DIAGNOSIS — K449 Diaphragmatic hernia without obstruction or gangrene: Secondary | ICD-10-CM | POA: Diagnosis present

## 2021-03-13 DIAGNOSIS — E119 Type 2 diabetes mellitus without complications: Secondary | ICD-10-CM

## 2021-03-13 DIAGNOSIS — Z7952 Long term (current) use of systemic steroids: Secondary | ICD-10-CM

## 2021-03-13 DIAGNOSIS — Z9842 Cataract extraction status, left eye: Secondary | ICD-10-CM

## 2021-03-13 DIAGNOSIS — R531 Weakness: Secondary | ICD-10-CM | POA: Diagnosis not present

## 2021-03-13 DIAGNOSIS — J9811 Atelectasis: Secondary | ICD-10-CM | POA: Diagnosis not present

## 2021-03-13 DIAGNOSIS — D509 Iron deficiency anemia, unspecified: Secondary | ICD-10-CM | POA: Diagnosis present

## 2021-03-13 DIAGNOSIS — Z8249 Family history of ischemic heart disease and other diseases of the circulatory system: Secondary | ICD-10-CM

## 2021-03-13 DIAGNOSIS — R0902 Hypoxemia: Secondary | ICD-10-CM | POA: Diagnosis not present

## 2021-03-13 DIAGNOSIS — Z7982 Long term (current) use of aspirin: Secondary | ICD-10-CM

## 2021-03-13 DIAGNOSIS — Z9841 Cataract extraction status, right eye: Secondary | ICD-10-CM

## 2021-03-13 DIAGNOSIS — E876 Hypokalemia: Secondary | ICD-10-CM | POA: Diagnosis present

## 2021-03-13 DIAGNOSIS — Z882 Allergy status to sulfonamides status: Secondary | ICD-10-CM

## 2021-03-13 DIAGNOSIS — Z87891 Personal history of nicotine dependence: Secondary | ICD-10-CM

## 2021-03-13 DIAGNOSIS — K219 Gastro-esophageal reflux disease without esophagitis: Secondary | ICD-10-CM | POA: Diagnosis present

## 2021-03-13 DIAGNOSIS — R21 Rash and other nonspecific skin eruption: Secondary | ICD-10-CM | POA: Diagnosis present

## 2021-03-13 DIAGNOSIS — W19XXXA Unspecified fall, initial encounter: Secondary | ICD-10-CM | POA: Diagnosis present

## 2021-03-13 DIAGNOSIS — H9190 Unspecified hearing loss, unspecified ear: Secondary | ICD-10-CM | POA: Diagnosis present

## 2021-03-13 DIAGNOSIS — Z79899 Other long term (current) drug therapy: Secondary | ICD-10-CM

## 2021-03-13 DIAGNOSIS — R2681 Unsteadiness on feet: Secondary | ICD-10-CM | POA: Diagnosis not present

## 2021-03-13 DIAGNOSIS — E871 Hypo-osmolality and hyponatremia: Secondary | ICD-10-CM | POA: Diagnosis not present

## 2021-03-13 DIAGNOSIS — E785 Hyperlipidemia, unspecified: Secondary | ICD-10-CM | POA: Diagnosis present

## 2021-03-13 LAB — BASIC METABOLIC PANEL
Anion gap: 12 (ref 5–15)
Anion gap: 12 (ref 5–15)
BUN: 12 mg/dL (ref 8–23)
BUN: 13 mg/dL (ref 8–23)
CO2: 27 mmol/L (ref 22–32)
CO2: 26 mmol/L (ref 22–32)
Calcium: 9.3 mg/dL (ref 8.9–10.3)
Calcium: 9.3 mg/dL (ref 8.9–10.3)
Chloride: 85 mmol/L — ABNORMAL LOW (ref 98–111)
Chloride: 87 mmol/L — ABNORMAL LOW (ref 98–111)
Creatinine, Ser: 1.16 mg/dL — ABNORMAL HIGH (ref 0.44–1.00)
Creatinine, Ser: 0.96 mg/dL (ref 0.44–1.00)
GFR, Estimated: 47 mL/min — ABNORMAL LOW (ref >60)
GFR, Estimated: 59 mL/min — ABNORMAL LOW (ref >60)
Glucose, Bld: 147 mg/dL — ABNORMAL HIGH (ref 70–99)
Glucose, Bld: 123 mg/dL — ABNORMAL HIGH (ref 70–99)
Potassium: 4.2 mmol/L (ref 3.5–5.1)
Potassium: 3.6 mmol/L (ref 3.5–5.1)
Sodium: 124 mmol/L — ABNORMAL LOW (ref 135–145)
Sodium: 125 mmol/L — ABNORMAL LOW (ref 135–145)

## 2021-03-13 LAB — CBC WITH DIFFERENTIAL/PLATELET
Abs Immature Granulocytes: 0.02 10*3/uL (ref 0.00–0.07)
Basophils Absolute: 0 10*3/uL (ref 0.0–0.1)
Basophils Relative: 1 %
Eosinophils Absolute: 0.1 10*3/uL (ref 0.0–0.5)
Eosinophils Relative: 1 %
HCT: 35.6 % — ABNORMAL LOW (ref 36.0–46.0)
Hemoglobin: 11.8 g/dL — ABNORMAL LOW (ref 12.0–15.0)
Immature Granulocytes: 0 %
Lymphocytes Relative: 18 %
Lymphs Abs: 1.2 10*3/uL (ref 0.7–4.0)
MCH: 26.6 pg (ref 26.0–34.0)
MCHC: 33.1 g/dL (ref 30.0–36.0)
MCV: 80.2 fL (ref 80.0–100.0)
Monocytes Absolute: 0.8 10*3/uL (ref 0.1–1.0)
Monocytes Relative: 12 %
Neutro Abs: 4.6 10*3/uL (ref 1.7–7.7)
Neutrophils Relative %: 68 %
Platelets: 271 10*3/uL (ref 150–400)
RBC: 4.44 MIL/uL (ref 3.87–5.11)
RDW: 16 % — ABNORMAL HIGH (ref 11.5–15.5)
WBC: 6.8 10*3/uL (ref 4.0–10.5)
nRBC: 0 % (ref 0.0–0.2)

## 2021-03-13 LAB — TROPONIN I (HIGH SENSITIVITY)
Troponin I (High Sensitivity): 7 ng/L (ref <18)
Troponin I (High Sensitivity): 7 ng/L (ref <18)

## 2021-03-13 LAB — OSMOLALITY: Osmolality: 259 mOsm/kg — ABNORMAL LOW (ref 275–295)

## 2021-03-13 LAB — VITAMIN B12: Vitamin B-12: 413 pg/mL (ref 180–914)

## 2021-03-13 LAB — NA AND K (SODIUM & POTASSIUM), RAND UR
Potassium Urine: 26 mmol/L
Sodium, Ur: 34 mmol/L

## 2021-03-13 LAB — CORTISOL: Cortisol, Plasma: 11.7 ug/dL

## 2021-03-13 LAB — OSMOLALITY, URINE: Osmolality, Ur: 246 mOsm/kg — ABNORMAL LOW (ref 300–900)

## 2021-03-13 LAB — CREATININE, URINE, RANDOM: Creatinine, Urine: 55.31 mg/dL

## 2021-03-13 MED ORDER — ACETAMINOPHEN 650 MG RE SUPP: 650.0000 mg | Freq: Four times a day (QID) | RECTAL | Status: DC | PRN

## 2021-03-13 MED ORDER — RAMIPRIL 10 MG PO CAPS: 10.0000 mg | ORAL_CAPSULE | Freq: Every day | ORAL | Status: DC

## 2021-03-13 MED ORDER — FLUTICASONE PROPIONATE 50 MCG/ACT NA SUSP: 1.0000 | NASAL | Status: DC | PRN

## 2021-03-13 MED ORDER — METOPROLOL SUCCINATE ER 25 MG PO TB24
25.0000 mg | ORAL_TABLET | Freq: Every day | ORAL | Status: DC
  Administered 2021-03-13 – 2021-03-14 (×2): 25 mg via ORAL
  Filled 2021-03-13 (×2): qty 1

## 2021-03-13 MED ORDER — ATORVASTATIN CALCIUM 10 MG PO TABS
20.0000 mg | ORAL_TABLET | Freq: Every day | ORAL | Status: DC
  Administered 2021-03-13 – 2021-03-15 (×3): 20 mg via ORAL
  Filled 2021-03-13 (×3): qty 2

## 2021-03-13 MED ORDER — PANTOPRAZOLE SODIUM 40 MG PO TBEC
40.0000 mg | DELAYED_RELEASE_TABLET | Freq: Two times a day (BID) | ORAL | Status: DC
  Administered 2021-03-14 – 2021-03-16 (×5): 40 mg via ORAL
  Filled 2021-03-13 (×5): qty 1

## 2021-03-13 MED ORDER — ASPIRIN EC 81 MG PO TBEC
81.0000 mg | DELAYED_RELEASE_TABLET | Freq: Every day | ORAL | Status: DC
  Administered 2021-03-14 – 2021-03-16 (×3): 81 mg via ORAL
  Filled 2021-03-13 (×3): qty 1

## 2021-03-13 MED ORDER — SIMETHICONE 80 MG PO CHEW
80.0000 mg | CHEWABLE_TABLET | Freq: Four times a day (QID) | ORAL | Status: DC
  Administered 2021-03-13 – 2021-03-16 (×11): 80 mg via ORAL
  Filled 2021-03-13 (×13): qty 1

## 2021-03-13 MED ORDER — SODIUM CHLORIDE 0.9 % IV BOLUS: 1000.0000 mL | Freq: Once | INTRAVENOUS | Status: DC

## 2021-03-13 MED ORDER — HYDROCORTISONE 1 % EX CREA
TOPICAL_CREAM | Freq: Two times a day (BID) | CUTANEOUS | Status: DC
  Filled 2021-03-13 (×3): qty 28

## 2021-03-13 MED ORDER — ONDANSETRON HCL 4 MG PO TABS: 4.0000 mg | ORAL_TABLET | Freq: Four times a day (QID) | ORAL | Status: DC | PRN

## 2021-03-13 MED ORDER — ENOXAPARIN SODIUM 40 MG/0.4ML IJ SOSY
40.0000 mg | PREFILLED_SYRINGE | INTRAMUSCULAR | Status: DC
  Administered 2021-03-13 – 2021-03-15 (×3): 40 mg via SUBCUTANEOUS
  Filled 2021-03-13 (×3): qty 0.4

## 2021-03-13 MED ORDER — ATORVASTATIN CALCIUM 10 MG PO TABS: 20.0000 mg | ORAL_TABLET | Freq: Every day | ORAL | Status: DC

## 2021-03-13 MED ORDER — ACETAMINOPHEN 325 MG PO TABS
650.0000 mg | ORAL_TABLET | Freq: Four times a day (QID) | ORAL | Status: DC | PRN
  Administered 2021-03-13: 650 mg via ORAL
  Filled 2021-03-13: qty 2

## 2021-03-13 MED ORDER — RAMIPRIL 10 MG PO CAPS
10.0000 mg | ORAL_CAPSULE | Freq: Every day | ORAL | Status: DC
  Administered 2021-03-14: 10 mg via ORAL
  Filled 2021-03-13: qty 1

## 2021-03-13 MED ORDER — ONDANSETRON HCL 4 MG/2ML IJ SOLN: 4.0000 mg | Freq: Four times a day (QID) | INTRAMUSCULAR | Status: DC | PRN

## 2021-03-13 MED ORDER — POLYETHYLENE GLYCOL 3350 17 G PO PACK: 17.0000 g | PACK | Freq: Every day | ORAL | Status: DC | PRN

## 2021-03-13 MED ORDER — GABAPENTIN 100 MG PO CAPS
100.0000 mg | ORAL_CAPSULE | Freq: Two times a day (BID) | ORAL | Status: DC
  Administered 2021-03-13 – 2021-03-14 (×2): 100 mg via ORAL
  Filled 2021-03-13 (×2): qty 1

## 2021-03-13 MED ORDER — SODIUM CHLORIDE 0.9 % IV SOLN: INTRAVENOUS | Status: DC

## 2021-03-13 MED ORDER — METOPROLOL SUCCINATE ER 25 MG PO TB24: 25.0000 mg | ORAL_TABLET | Freq: Every day | ORAL | Status: DC

## 2021-03-13 NOTE — ED Triage Notes (Addendum)
Pt to triage via [facility].  Reports dizziness and weakness x 2 days.  Woke up with chest pain this morning at 6am.  Took ASA '324mg'$  PTA.  20g R AC.  Reports unsteady gait- fell on Wednesday and has skin tear to L forearm.  Hx of vertigo but states this feels different.

## 2021-03-13 NOTE — ED Notes (Signed)
Report given to Britany W, RN  
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
## 2021-03-13 NOTE — H&P (Signed)
[facility] History and Physical   Patient: Emily Sweeney [id number]   PCP: Binnie Rail, MD DOB: [birth year]   DOA: 03/13/2021   DOS: 03/13/2021   DOS: the patient was seen and examined on 03/13/2021  Patient coming from: The patient is coming from Home  Chief Complaint: Dizziness, fall, right-sided chest pain  HPI: Emily Sweeney is a 80 y.o. female with Past medical history of anemia, GERD, HLD, HTN, B12 deficiency, chronic radiculopathy. Patient presented with complaints of fall and chest pain and poor p.o. intake. Patient has chronic imbalance secondary to her radiculopathy.  On Wednesday when she was trying to go to the bed she lost her footing and had a fall.  Fell face forward and hit right forehead.  Denies any passing out event or dizziness or prodrome.  After the fall she tried to stand up again and try to walk again and had another fall this time she did not hit anything.  Then she slept did not have any other fall since then. She has not had anything to eat or drink since Wednesday as she does not have any appetite.  She denies any nausea or vomiting denies any diarrhea or abdominal pain.  Denies any constipation.  No fever no chills.  She remained compliant with all her medication throughout this time. She reports that she has noticed a rash on her left elbow area and she thinks that something might have bit her.  The rash is itching her. She reports that she has right-sided chest pain intermittently ongoing lasting 30 minutes nonradiating.  Feels like tightness.  Resolving with rest.  Not associated with exertion.  Not associated with nausea vomiting or shortness of breath. She denies having any similar chest tightness prior to this admission.  ED Course: Troponins were negative.  EKG unremarkable.  ACS ruled out.  Patient was found to have hyponatremia with sodium 124.  Was referred for admission.   Review of Systems: as mentioned in the history of present illness.   All other systems reviewed and are negative.  Past Medical History:  Diagnosis Date   Anemia    Blood transfusion without reported diagnosis    Diverticulosis of colon    GERD (gastroesophageal reflux disease)    Hearing loss    right ear, and tinnitus   Hyperlipidemia    Hypertension    Tubular adenoma 03/05/2015   Polyp, and Benign lymphoid polyp.   UTI (urinary tract infection)    Past Surgical History:  Procedure Laterality Date   APPENDECTOMY     BREAST BIOPSY     benign   CATARACT EXTRACTION, BILATERAL  2019, 2020   COCHLEAR IMPLANT     CRANIECTOMY FOR EXCISION OF ACOUSTIC NEUROMA  2009   NASAL SEPTUM SURGERY     TONSILLECTOMY     UPPER GASTROINTESTINAL ENDOSCOPY  06/02/2020   Social History:  reports that she quit smoking about 29 years ago. She has never used smokeless tobacco. She reports current alcohol use. She reports that she does not use drugs.  Allergies  Allergen Reactions   Macrobid [Nitrofurantoin Macrocrystal] Other (See Comments)    Fever, pain, fatigue, no appetite   Norvasc [Amlodipine Besylate]     Palpitations, low bp   Ceftin [Cefuroxime Axetil] Hives and Rash   Ciprofloxacin Rash   Penicillins Hives and Rash    Has patient had a PCN reaction causing immediate rash, facial/tongue/throat swelling, SOB or lightheadedness with hypotension: No Has patient had a PCN reaction causing  severe rash involving mucus membranes or skin necrosis: Fayrene Fearing Has patient had a PCN reaction that required hospitalization No Has patient had a PCN reaction occurring within the last 10 years: No If all of the above answers are "NO", then may proceed with Cephalosporin use.    Sulfonamide Derivatives Hives and Rash   Family history reviewed and not pertinent Family History  Problem Relation Age of Onset   Sudden death Father 49       MI   Coronary artery disease Father    Heart disease Father    Dementia Mother 59   Alcohol abuse Brother    Colon cancer Neg  Hx    Esophageal cancer Neg Hx      Prior to Admission medications   Medication Sig Start Date End Date Taking? Authorizing Provider  aspirin EC 81 MG tablet Take 1 tablet (81 mg total) by mouth daily. 10/16/14   Janith Lima, MD  atorvastatin (LIPITOR) 20 MG tablet TAKE 1 TABLET ONCE DAILY. 02/28/21   Binnie Rail, MD  fluticasone (FLONASE) 50 MCG/ACT nasal spray Place 1 spray into both nostrils as needed for allergies or rhinitis.    [provider]  gabapentin (NEURONTIN) 100 MG capsule Take 1 capsule (100 mg total) by mouth 2 (two) times daily. Patient not taking: Reported on 11/19/2020 08/16/20   Binnie Rail, MD  hydrochlorothiazide (HYDRODIURIL) 25 MG tablet Take 0.5 tablets (12.5 mg total) by mouth daily. 12/24/20   Binnie Rail, MD  meloxicam (MOBIC) 15 MG tablet Take 1 tablet (15 mg total) by mouth daily. 08/16/20   Binnie Rail, MD  metoprolol succinate (TOPROL-XL) 25 MG 24 hr tablet TAKE 1 TABLET ONCE DAILY. 02/28/21   Binnie Rail, MD  Multiple Vitamins-Minerals (HAIR SKIN AND NAILS FORMULA) TABS Take 1 tablet by mouth daily.    [provider]  pantoprazole (PROTONIX) 40 MG tablet Take 1 tablet (40 mg total) by mouth daily. 06/02/20   Ladene Artist, MD  predniSONE (DELTASONE) 5 MG tablet  08/31/20   [provider]  Probiotic Product (PROBIOTIC ADVANCED PO) Take 1 capsule by mouth daily.    [provider]  ramipril (ALTACE) 10 MG capsule Take 1 capsule (10 mg total) by mouth daily. 10/25/20   Binnie Rail, MD  simethicone (MYLICON) 80 MG chewable tablet Chew 80 mg by mouth every 6 (six) hours as needed for flatulence.    [provider]  VITAMIN D, CHOLECALCIFEROL, PO Take 5,000 Units by mouth daily.     [provider]    Physical Exam: Vitals:   03/13/21 1445 03/13/21 1800 03/13/21 1830 03/13/21 1845  BP: (!) 146/81 140/77 139/81 (!) 151/80  Pulse: 99 96 98 93  Resp: '13 15 15 20  '$ Temp:      TempSrc:      SpO2:  100% 97% 97% 97%    General: alert and oriented to time, place, and person. Appear in mild distress, affect appropriate Eyes: PERRL, Conjunctiva normal ENT: Oral Mucosa Clear, moist  Neck: no JVD, no Abnormal Mass Or lumps Cardiovascular: S1 and S2 Present, no Murmur, peripheral pulses symmetrical Respiratory: good respiratory effort, Bilateral Air entry equal and Decreased, no signs of accessory muscle use, Clear to Auscultation, no Crackles, no wheezes Abdomen: Bowel Sound present, Soft and no tenderness, no hernia Skin: no rashes  Extremities: no Pedal edema, no calf tenderness Neurologic: without any new focal findings Gait not checked due to patient safety  concerns  Data Reviewed: I have personally reviewed and interpreted labs, imaging as discussed below.  CBC: Recent Labs  Lab 03/13/21 1135  WBC 6.8  NEUTROABS 4.6  HGB 11.8*  HCT 35.6*  MCV 80.2  PLT 99991111   Basic Metabolic Panel: Recent Labs  Lab 03/13/21 1135  NA 124*  K 4.2  CL 85*  CO2 27  GLUCOSE 147*  BUN 12  CREATININE 1.16*  CALCIUM 9.3   GFR: CrCl cannot be calculated (Unknown ideal weight.). Liver Function Tests: No results for input(s): AST, ALT, ALKPHOS, BILITOT, PROT, ALBUMIN in the last 168 hours. No results for input(s): LIPASE, AMYLASE in the last 168 hours. No results for input(s): AMMONIA in the last 168 hours. Coagulation Profile: No results for input(s): INR, PROTIME in the last 168 hours. Cardiac Enzymes: No results for input(s): CKTOTAL, CKMB, CKMBINDEX, TROPONINI in the last 168 hours. BNP (last 3 results) No results for input(s): PROBNP in the last 8760 hours. HbA1C: No results for input(s): HGBA1C in the last 72 hours. CBG: No results for input(s): GLUCAP in the last 168 hours. Lipid Profile: No results for input(s): CHOL, HDL, LDLCALC, TRIG, CHOLHDL, LDLDIRECT in the last 72 hours. Thyroid Function Tests: No results for input(s): TSH, T4TOTAL, FREET4, T3FREE, THYROIDAB in the  last 72 hours. Anemia Panel: No results for input(s): VITAMINB12, FOLATE, FERRITIN, TIBC, IRON, RETICCTPCT in the last 72 hours. Urine analysis:    Component Value Date/Time   COLORURINE YELLOW 07/09/2017 1530   APPEARANCEUR [place] (A) 07/09/2017 1530   LABSPEC 1.025 07/09/2017 1530   PHURINE 5.5 07/09/2017 1530   GLUCOSEU 100 (A) 07/09/2017 1530   HGBUR NEGATIVE 07/09/2017 1530   HGBUR negative 03/26/2010 0938   BILIRUBINUR negative 12/29/2017 [place] 07/09/2017 1530   PROTEINUR 1+ 12/29/2017 1244   UROBILINOGEN 0.2 12/29/2017 1244   UROBILINOGEN 0.2 07/09/2017 1530   NITRITE negative 12/29/2017 1244   NITRITE POSITIVE (A) 07/09/2017 1530   LEUKOCYTESUR Negative 12/29/2017 1244    Radiological Exams on Admission: DG Chest 2 View  Result Date: 03/13/2021 CLINICAL DATA:  Chest pressure.  Shortness of breath. EXAM: CHEST - 2 VIEW COMPARISON:  Chest radiograph 06/13/2018. FINDINGS: Stable cardiac and mediastinal contours. Bibasilar airspace opacities. No pleural effusion or pneumothorax. Thoracic spine degenerative changes. IMPRESSION: Bibasilar heterogeneous opacities favored to represent atelectasis. Electronically Signed   By: Lovey Newcomer M.D.   On: 03/13/2021 12:17   CT Head Wo Contrast  Result Date: 03/13/2021 CLINICAL DATA:  Dizziness, weakness.  Stroke suspected EXAM: CT HEAD WITHOUT CONTRAST TECHNIQUE: Contiguous axial images were obtained from the base of the skull through the vertex without intravenous contrast. COMPARISON:  06/13/2018 FINDINGS: Brain: There is atrophy and chronic small vessel disease changes. No acute intracranial abnormality. Specifically, no hemorrhage, hydrocephalus, mass lesion, acute infarction, or significant intracranial injury. Vascular: No hyperdense vessel or unexpected calcification. Skull: No acute calvarial abnormality. Sinuses/Orbits: No acute findings Other: None IMPRESSION: Atrophy, chronic microvascular disease. No acute  intracranial abnormality. Electronically Signed   By: Rolm Baptise M.D.   On: 03/13/2021 15:20   EKG: Independently reviewed. normal EKG, normal sinus rhythm, nonspecific ST and T waves changes.  I reviewed all nursing notes, pharmacy notes, vitals, pertinent old records.  Assessment/Plan 1.  Dizziness and fall Dizziness appears to be associated with hyponatremia. Currently no focal deficit. CT of the head unremarkable. Will get PT OT evaluation. Check orthostatic. Will provide IV fluids. Hold HCTZ.  2.  Hyponatremia. From poor  p.o. intake for last 4 days associated with ongoing use of hydrochlorothiazide. Will be holding this medication. Provide IV hydration. Check further work-up including osmolality of serum and urine, 6 urine sodium urine creatinine and urine potassium and TSH and cortisol level. BMP every 4 hours.  3.  Chest tightness. Reports right-sided chest tightness. Appears atypical in nature. Troponins are negative.  EKG unremarkable. Suspect this likely associated with her hiatal hernia. At present I do not think further work-up is necessary for now. Outpatient follow-up with primary cardiologist.  4.  Large hiatal hernia. Poor p.o. intake. Could be associated with her hiatal hernia and may have gastritis. Will increase PPI to twice daily. Also change simethicone to schedule.  5.  Rash on the left arm. Does not appear to be significant for now. Will provide topical treatment on this monitor.  6.  Hyperlipidemia. Continue Lipitor.  7.  Chronic lumbar radiculopathy. Gabapentin continue.  8.  HTN. On Toprol.  Also continue ramipril. Will continue. Hold HCTZ.  9.  Diet-controlled diabetes mellitus type 2 with neuropathy without any long-term insulin use Monitor for now.  Not on therapy at home.  Nutrition: Soft diet DVT Prophylaxis: Subcutaneous Lovenox  Advance goals of care discussion: Full code   Consults:  Family Communication: no family was  present at bedside, at the time of interview.   Disposition:  From: Home Likely will need Home on discharge.   Author: Berle Mull, MD [facility] 03/13/2021 7:16 PM   To reach On-call, see care teams to locate the attending and reach out to them via [email or web address]. If 7PM-7AM, please contact night-coverage If you still have difficulty reaching the attending provider, please page the [facility] (Director on Call) for [facility] on amion for assistance.

## 2021-03-13 NOTE — ED Provider Notes (Signed)
Emergency Medicine Provider Triage Evaluation Note  Emily Sweeney , a 80 y.o. female  was evaluated in triage.  Pt complains of chest pressure. Started today. Mild Sob, no cough, LE edema, leg pain. Feel 2 days ago, denies hitting head. Has had unsteady feet x months. No numbness, unilateral weakness  Review of Systems  Positive: Cp Negative: Back pain, LE edema  Physical Exam  There were no vitals taken for this visit. Gen:   Awake, no distress   Resp:  Normal effort  MSK:   Moves extremities without difficulty  Neuro:  Equal grip, CN 2-12 grossly intact Other:    Medical Decision Making  Medically screening exam initiated at 11:34 AM.  Appropriate orders placed.  [name] was informed that the remainder of the evaluation will be completed by another provider, this initial triage assessment does not replace that evaluation, and the importance of remaining in the ED until their evaluation is complete.  CP, unsteady gait x months   Juno Alers A, PA-C 03/13/21 1135    Wyvonnia Dusky, MD 03/14/21 [phone]

## 2021-03-14 DIAGNOSIS — E871 Hypo-osmolality and hyponatremia: Secondary | ICD-10-CM | POA: Diagnosis present

## 2021-03-14 DIAGNOSIS — Z7952 Long term (current) use of systemic steroids: Secondary | ICD-10-CM | POA: Diagnosis not present

## 2021-03-14 DIAGNOSIS — R42 Dizziness and giddiness: Secondary | ICD-10-CM

## 2021-03-14 DIAGNOSIS — H9190 Unspecified hearing loss, unspecified ear: Secondary | ICD-10-CM | POA: Diagnosis present

## 2021-03-14 DIAGNOSIS — K449 Diaphragmatic hernia without obstruction or gangrene: Secondary | ICD-10-CM | POA: Diagnosis present

## 2021-03-14 DIAGNOSIS — Z8249 Family history of ischemic heart disease and other diseases of the circulatory system: Secondary | ICD-10-CM | POA: Diagnosis not present

## 2021-03-14 DIAGNOSIS — I959 Hypotension, unspecified: Secondary | ICD-10-CM | POA: Diagnosis present

## 2021-03-14 DIAGNOSIS — E538 Deficiency of other specified B group vitamins: Secondary | ICD-10-CM | POA: Diagnosis present

## 2021-03-14 DIAGNOSIS — M5416 Radiculopathy, lumbar region: Secondary | ICD-10-CM | POA: Diagnosis present

## 2021-03-14 DIAGNOSIS — K219 Gastro-esophageal reflux disease without esophagitis: Secondary | ICD-10-CM | POA: Diagnosis present

## 2021-03-14 DIAGNOSIS — I10 Essential (primary) hypertension: Secondary | ICD-10-CM | POA: Diagnosis present

## 2021-03-14 DIAGNOSIS — Z9841 Cataract extraction status, right eye: Secondary | ICD-10-CM | POA: Diagnosis not present

## 2021-03-14 DIAGNOSIS — Z9049 Acquired absence of other specified parts of digestive tract: Secondary | ICD-10-CM | POA: Diagnosis not present

## 2021-03-14 DIAGNOSIS — Z7982 Long term (current) use of aspirin: Secondary | ICD-10-CM | POA: Diagnosis not present

## 2021-03-14 DIAGNOSIS — R262 Difficulty in walking, not elsewhere classified: Secondary | ICD-10-CM | POA: Diagnosis present

## 2021-03-14 DIAGNOSIS — E119 Type 2 diabetes mellitus without complications: Secondary | ICD-10-CM

## 2021-03-14 DIAGNOSIS — E1140 Type 2 diabetes mellitus with diabetic neuropathy, unspecified: Secondary | ICD-10-CM | POA: Diagnosis present

## 2021-03-14 DIAGNOSIS — R21 Rash and other nonspecific skin eruption: Secondary | ICD-10-CM | POA: Diagnosis present

## 2021-03-14 DIAGNOSIS — W19XXXA Unspecified fall, initial encounter: Secondary | ICD-10-CM | POA: Diagnosis present

## 2021-03-14 DIAGNOSIS — Z888 Allergy status to other drugs, medicaments and biological substances status: Secondary | ICD-10-CM | POA: Diagnosis not present

## 2021-03-14 DIAGNOSIS — Z87891 Personal history of nicotine dependence: Secondary | ICD-10-CM | POA: Diagnosis not present

## 2021-03-14 DIAGNOSIS — Z79899 Other long term (current) drug therapy: Secondary | ICD-10-CM | POA: Diagnosis not present

## 2021-03-14 DIAGNOSIS — Z9842 Cataract extraction status, left eye: Secondary | ICD-10-CM | POA: Diagnosis not present

## 2021-03-14 DIAGNOSIS — Z20822 Contact with and (suspected) exposure to covid-19: Secondary | ICD-10-CM | POA: Diagnosis present

## 2021-03-14 DIAGNOSIS — E785 Hyperlipidemia, unspecified: Secondary | ICD-10-CM

## 2021-03-14 DIAGNOSIS — E876 Hypokalemia: Secondary | ICD-10-CM | POA: Diagnosis present

## 2021-03-14 DIAGNOSIS — D509 Iron deficiency anemia, unspecified: Secondary | ICD-10-CM | POA: Diagnosis present

## 2021-03-14 LAB — BASIC METABOLIC PANEL
Anion gap: 11 (ref 5–15)
Anion gap: 9 (ref 5–15)
Anion gap: 10 (ref 5–15)
BUN: 12 mg/dL (ref 8–23)
BUN: 12 mg/dL (ref 8–23)
BUN: 12 mg/dL (ref 8–23)
CO2: 26 mmol/L (ref 22–32)
CO2: 27 mmol/L (ref 22–32)
CO2: 24 mmol/L (ref 22–32)
Calcium: 9.4 mg/dL (ref 8.9–10.3)
Calcium: 9 mg/dL (ref 8.9–10.3)
Calcium: 8.8 mg/dL — ABNORMAL LOW (ref 8.9–10.3)
Chloride: 88 mmol/L — ABNORMAL LOW (ref 98–111)
Chloride: 90 mmol/L — ABNORMAL LOW (ref 98–111)
Chloride: 91 mmol/L — ABNORMAL LOW (ref 98–111)
Creatinine, Ser: 1.02 mg/dL — ABNORMAL HIGH (ref 0.44–1.00)
Creatinine, Ser: 0.95 mg/dL (ref 0.44–1.00)
Creatinine, Ser: 0.96 mg/dL (ref 0.44–1.00)
GFR, Estimated: 55 mL/min — ABNORMAL LOW (ref >60)
GFR, Estimated: >60 mL/min (ref >60)
GFR, Estimated: 59 mL/min — ABNORMAL LOW (ref >60)
Glucose, Bld: 152 mg/dL — ABNORMAL HIGH (ref 70–99)
Glucose, Bld: 114 mg/dL — ABNORMAL HIGH (ref 70–99)
Glucose, Bld: 121 mg/dL — ABNORMAL HIGH (ref 70–99)
Potassium: 3.3 mmol/L — ABNORMAL LOW (ref 3.5–5.1)
Potassium: 3.5 mmol/L (ref 3.5–5.1)
Potassium: 3.4 mmol/L — ABNORMAL LOW (ref 3.5–5.1)
Sodium: 125 mmol/L — ABNORMAL LOW (ref 135–145)
Sodium: 126 mmol/L — ABNORMAL LOW (ref 135–145)
Sodium: 125 mmol/L — ABNORMAL LOW (ref 135–145)

## 2021-03-14 LAB — CBC
HCT: 32.2 % — ABNORMAL LOW (ref 36.0–46.0)
Hemoglobin: 10.7 g/dL — ABNORMAL LOW (ref 12.0–15.0)
MCH: 26.5 pg (ref 26.0–34.0)
MCHC: 33.2 g/dL (ref 30.0–36.0)
MCV: 79.7 fL — ABNORMAL LOW (ref 80.0–100.0)
Platelets: 217 10*3/uL (ref 150–400)
RBC: 4.04 MIL/uL (ref 3.87–5.11)
RDW: 16.4 % — ABNORMAL HIGH (ref 11.5–15.5)
WBC: 5.3 10*3/uL (ref 4.0–10.5)
nRBC: 0 % (ref 0.0–0.2)

## 2021-03-14 LAB — SARS CORONAVIRUS 2 (TAT 6-24 HRS): SARS Coronavirus 2: NEGATIVE

## 2021-03-14 LAB — TSH: TSH: 1.566 u[IU]/mL (ref 0.350–4.500)

## 2021-03-14 NOTE — Evaluation (Addendum)
Occupational Therapy Evaluation Patient Details Name: Emily Sweeney MRN: [id number] DOB: 03/17/1941 Today's Date: 03/14/2021    History of Present Illness 80 year old female with a history of HTN, HLD, DM, lumbar radiculopathy, presents with concern for chest pain, generalized weakness, feeling off balance with findings of hyponatremia.   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 Pt currently, with supervisionA for OOB ADL for lines and mobility with no physical assist. Pt appears to be at her functional baseline. Pt does not require continued OT skilled services. OT signing off.  O2 >90% on RA and ~80s BPM    Follow Up Recommendations  No OT follow up;Supervision - Intermittent (initially have daughter at home with patient)    Equipment Recommendations  None recommended by OT    Recommendations for Other Services       Precautions / Restrictions Precautions Precautions: Fall Restrictions Weight Bearing Restrictions: No      Mobility Bed Mobility Overal bed mobility: Modified Independent                  Transfers Overall transfer level: Needs assistance Equipment used: Rolling walker (2 wheeled) Transfers: Sit to/from Stand Sit to Stand: Supervision              Balance Overall balance assessment: Needs assistance Sitting-balance support: Feet supported;No upper extremity supported Sitting balance-Leahy Scale: Good     Standing balance support: Single extremity supported Standing balance-Leahy Scale: Good                             ADL either performed or assessed with clinical judgement   ADL Overall ADL's : Modified independent;At baseline                                       General ADL Comments: Pt advised to have supervisionA due to connected to IV and use of furniture for stability. Pt's RW in room was a little hard to push so pt preferred  use fo furniture. Pt standing at sink for ADL tasks without physical assist.     Vision Baseline Vision/History: Wears glasses Patient Visual Report: No change from baseline Vision Assessment?: No apparent visual deficits     Perception     Praxis      Pertinent Vitals/Pain Pain Assessment: No/denies pain     Hand Dominance Right   Extremity/Trunk Assessment Upper Extremity Assessment Upper Extremity Assessment: Overall WFL for tasks assessed   Lower Extremity Assessment Lower Extremity Assessment: Overall WFL for tasks assessed   Cervical / Trunk Assessment Cervical / Trunk Assessment: Normal   Communication Communication Communication: No difficulties   Cognition Arousal/Alertness: Awake/alert Behavior During Therapy: WFL for tasks assessed/performed Overall Cognitive Status: Within Functional Limits for tasks assessed                                 General Comments: A/O x4, very sharp   General Comments  O2 >90% on RA and ~80s BPM    Exercises     Shoulder Instructions      Home Living Family/patient expects to be discharged to:: Private residence Living Arrangements: Alone Available Help at Discharge: Family;Available 24 hours/day Type of Home: House Home Access: Stairs to enter [employer]  of Steps: 2+2 Entrance Stairs-Rails: Can reach both Home Layout: Two level;Able to live on main level with bedroom/bathroom Alternate Level Stairs-Number of Steps: full flight   Bathroom Shower/Tub: Teacher, early years/pre: Standard     Home Equipment: Environmental consultant - 2 wheels;Shower seat;Grab bars - tub/shower          Prior Functioning/Environment Level of Independence: Independent        Comments: still working as a Engineer, water; drives; family lives out of town; hoem wth her dog independently; 1x/month has household cleaner        OT Problem List: Decreased activity tolerance      OT Treatment/Interventions:       OT Goals(Current goals can be found in the care plan section) Acute Rehab OT Goals Patient Stated Goal: to go home OT Goal Formulation: All assessment and education complete, DC therapy Potential to Achieve Goals: Good  OT Frequency:     Barriers to D/C:            Co-evaluation              AM-PAC OT "6 Clicks" Daily Activity     Outcome Measure Help from another person eating meals?: None Help from another person taking care of personal grooming?: None Help from another person toileting, which includes using toliet, bedpan, or urinal?: None Help from another person bathing (including washing, rinsing, drying)?: A Little Help from another person to put on and taking off regular upper body clothing?: None Help from another person to put on and taking off regular lower body clothing?: None 6 Click Score: 23   End of Session Equipment Utilized During Treatment: Rolling walker  Activity Tolerance: Patient tolerated treatment well Patient left: in chair;with call bell/phone within reach  OT Visit Diagnosis: Unsteadiness on feet (R26.81)                [id number] OT Time Calculation (min): 21 min Charges:  OT General Charges $OT Visit: 1 Visit OT Evaluation $OT Eval Moderate Complexity: 1 Mod  Jefferey Pica, OTR/L Acute Rehabilitation Services Pager: [phone] Office: [place] 03/14/2021, 10:20 AM

## 2021-03-14 NOTE — Progress Notes (Signed)
PROGRESS NOTE  Emily Sweeney  [id number] DOB: [birth year] DOA: 03/13/2021 PCP: Binnie Rail, MD   Brief Narrative: Emily Sweeney is an 80 y.o. female with a history of HTN, HLD, sciatica, B2 deficiency, GERD who presented to the ED 7/31 with progressive fatigue, weakness, poor appetite for a couple months with increasingly frequent falls at home. CT head nonacute. Sodium found to be newly low at 124. Pt admitted, HCTZ held, and sodium levels have not improved appreciably.   Assessment & Plan: Principal Problem:   Dizziness Active Problems:   Dyslipidemia   Essential hypertension   B12 deficiency   Chronic lumbar radiculopathy   Diabetes mellitus without complication (HCC)   Hyponatremia  Hyponatremia, dizziness: Multifactorial, certainly with an element of dietary imbalance/low relative solute intake. Thiazide diuretic likely contributing. Relative adrenal insufficiency may be at play with concomitant hypotension and 2 recent prednisone tapers, though random cortisol level was 11.7. TSH wnl 1.566.  - Hold HCTZ. UNa inappropriately >20 suggestive of impaired renal handling from this. Urine osm not being lower similarly suggests impaired renal urinary dilution. Will recheck tomorrow AM after drug is more out of her system. - Start modest fluid restriction. - Level not appreciably improved this AM with trial of IVF, pt appears euvolemic, will stop IVF.  Hypotension in setting of essential HTN:  - Holding HCTZ/plan to discontinue. - Will hold ramipril and metoprolol as well.  Hypokalemia:  - Supplement and monitor in AM.  HLD:  - Continue statin  GERD:  - Continue PPI  Sciatica:  - Continue outpatient Tx. Currently quiescent.  - When treating with oral steroids in the future, suggest as low of dose as possible and protracted tapering.  - Pt not taking gabapentin, will DC order.  Vitamin B12 deficiency: Level sufficient at 413. - Continue supplement   Chest pain:  Resolved, atypical. Troponins negative, ECG reassuring. ACS ruled out.  Microcytic anemia: Mild.  - Monitor in AM, check iron stores  DVT prophylaxis: Lovenox Code Status: Full Family Communication: Daughter at bedside Disposition Plan:  Status is: Inpatient  Remains inpatient appropriate because:Persistent severe electrolyte disturbances. Pt remains dizzy, high fall risk, due to persistent hyponatremia.  Dispo: The patient is from: Home              Anticipated d/c is to: Home              Patient currently is not medically stable to d/c.   Difficult to place patient No  Consultants:  Nephrology  Procedures:  None  Antimicrobials: None   Subjective: Ate a better breakfast than she had been eating for several months. No nausea or vomiting. Had some blurry vision working with PT earlier and BP was low.   Objective: Vitals:   03/13/21 2130 03/13/21 2320 03/14/21 0445 03/14/21 1233  BP: (!) 157/86 (!) 150/90 130/79 (!) 90/49  Pulse: 87 89 69 75  Resp: '12 15 16 16  '$ Temp:  97.7 F (36.5 C) 97.6 F (36.4 C) 98.8 F (37.1 C)  TempSrc:  Oral Oral Oral  SpO2: 98% 100% 96% 97%  Weight:  64.8 kg    Height:  5' 7.5" (1.715 m)      Intake/Output Summary (Last 24 hours) at 03/14/2021 1405 Last data filed at 03/14/2021 [id number] Gross per 24 hour  Intake 336.8 ml  Output --  Net 336.8 ml   Filed Weights   03/13/21 2320  Weight: 64.8 kg   Gen: WDWN elderly female in no distress Pulm:  Non-labored breathing room air. Clear to auscultation bilaterally.  CV: Regular rate and rhythm. No murmur, rub, or gallop. No JVD, no pedal edema. GI: Abdomen soft, non-tender, non-distended, with normoactive bowel sounds. No organomegaly or masses felt. Ext: Warm, no deformities Skin: Left dorsal forearm with hemostatic abrasion, shallow abrasion on right forehead. No other wounds.  Neuro: Alert and oriented. No focal neurological deficits. Psych: Judgement and insight appear normal. Mood &  affect appropriate.   Data Reviewed: I have personally reviewed following labs and imaging studies  CBC: Recent Labs  Lab 03/13/21 1135 03/14/21 0541  WBC 6.8 5.3  NEUTROABS 4.6  --   HGB 11.8* 10.7*  HCT 35.6* 32.2*  MCV 80.2 79.7*  PLT 271 [id number]   Basic Metabolic Panel: Recent Labs  Lab 03/13/21 1135 03/13/21 1852 03/14/21 0041 03/14/21 0541 03/14/21 1023  NA 124* 125* 125* 126* 125*  K 4.2 3.6 3.3* 3.5 3.4*  CL 85* 87* 88* 90* 91*  CO2 '27 26 26 27 24  '$ GLUCOSE 147* 123* 152* 114* 121*  BUN '12 13 12 12 12  '$ CREATININE 1.16* 0.96 1.02* 0.95 0.96  CALCIUM 9.3 9.3 9.4 9.0 8.8*   GFR: Estimated Creatinine Clearance: 45.6 mL/min (by C-G formula based on SCr of 0.96 mg/dL). Liver Function Tests: No results for input(s): AST, ALT, ALKPHOS, BILITOT, PROT, ALBUMIN in the last 168 hours. No results for input(s): LIPASE, AMYLASE in the last 168 hours. No results for input(s): AMMONIA in the last 168 hours. Coagulation Profile: No results for input(s): INR, PROTIME in the last 168 hours. Cardiac Enzymes: No results for input(s): CKTOTAL, CKMB, CKMBINDEX, TROPONINI in the last 168 hours. BNP (last 3 results) No results for input(s): PROBNP in the last 8760 hours. HbA1C: No results for input(s): HGBA1C in the last 72 hours. CBG: No results for input(s): GLUCAP in the last 168 hours. Lipid Profile: No results for input(s): CHOL, HDL, LDLCALC, TRIG, CHOLHDL, LDLDIRECT in the last 72 hours. Thyroid Function Tests: Recent Labs    03/13/21 1815  TSH 1.566   Anemia Panel: Recent Labs    03/13/21 1852  VITAMINB12 413   Urine analysis:    Component Value Date/Time   COLORURINE YELLOW 07/09/2017 1530   APPEARANCEUR [place] (A) 07/09/2017 1530   LABSPEC 1.025 07/09/2017 1530   PHURINE 5.5 07/09/2017 1530   GLUCOSEU 100 (A) 07/09/2017 1530   HGBUR NEGATIVE 07/09/2017 1530   HGBUR negative 03/26/2010 0938   BILIRUBINUR negative 12/29/2017 [place]  07/09/2017 1530   PROTEINUR 1+ 12/29/2017 1244   UROBILINOGEN 0.2 12/29/2017 1244   UROBILINOGEN 0.2 07/09/2017 1530   NITRITE negative 12/29/2017 1244   NITRITE POSITIVE (A) 07/09/2017 1530   LEUKOCYTESUR Negative 12/29/2017 1244   Recent Results (from the past 240 hour(s))  SARS CORONAVIRUS 2 (TAT 6-24 HRS) Nasopharyngeal Nasopharyngeal Swab     Status: None   Collection Time: 03/14/21  3:51 AM   Specimen: Nasopharyngeal Swab  Result Value Ref Range Status   SARS Coronavirus 2 NEGATIVE NEGATIVE Final    Comment: (NOTE) SARS-CoV-2 target nucleic acids are NOT DETECTED.  The SARS-CoV-2 RNA is generally detectable in upper and lower respiratory specimens during the acute phase of infection. Negative results do not preclude SARS-CoV-2 infection, do not rule out co-infections with other pathogens, and should not be used as the sole basis for treatment or other patient management decisions. Negative results must be combined with clinical observations, patient history, and epidemiological information. The expected result  is Negative.  Fact Sheet for Patients: SugarRoll.be  Fact Sheet for Healthcare Providers: https://www.woods-mathews.com/  This test is not yet approved or cleared by the [place] FDA and  has been authorized for detection and/or diagnosis of SARS-CoV-2 by FDA under an Emergency Use Authorization (EUA). This EUA will remain  in effect (meaning this test can be used) for the duration of the COVID-19 declaration under Se ction 564(b)(1) of the Act, 21 U.S.C. section 360bbb-3(b)(1), unless the authorization is terminated or revoked sooner.  Performed at [facility], [place] [address] [place], [place] [address]       Radiology Studies: DG Chest 2 View  Result Date: 03/13/2021 CLINICAL DATA:  Chest pressure.  Shortness of breath. EXAM: CHEST - 2 VIEW COMPARISON:  Chest radiograph 06/13/2018. FINDINGS: Stable  cardiac and mediastinal contours. Bibasilar airspace opacities. No pleural effusion or pneumothorax. Thoracic spine degenerative changes. IMPRESSION: Bibasilar heterogeneous opacities favored to represent atelectasis. Electronically Signed   By: Lovey Newcomer M.D.   On: 03/13/2021 12:17   CT Head Wo Contrast  Result Date: 03/13/2021 CLINICAL DATA:  Dizziness, weakness.  Stroke suspected EXAM: CT HEAD WITHOUT CONTRAST TECHNIQUE: Contiguous axial images were obtained from the base of the skull through the vertex without intravenous contrast. COMPARISON:  06/13/2018 FINDINGS: Brain: There is atrophy and chronic small vessel disease changes. No acute intracranial abnormality. Specifically, no hemorrhage, hydrocephalus, mass lesion, acute infarction, or significant intracranial injury. Vascular: No hyperdense vessel or unexpected calcification. Skull: No acute calvarial abnormality. Sinuses/Orbits: No acute findings Other: None IMPRESSION: Atrophy, chronic microvascular disease. No acute intracranial abnormality. Electronically Signed   By: Rolm Baptise M.D.   On: 03/13/2021 15:20    Scheduled Meds:  aspirin EC  81 mg Oral Daily   atorvastatin  20 mg Oral QHS   enoxaparin (LOVENOX) injection  40 mg Subcutaneous Q24H   gabapentin  100 mg Oral BID   hydrocortisone cream   Topical BID   metoprolol succinate  25 mg Oral Daily   pantoprazole  40 mg Oral BID AC   ramipril  10 mg Oral Daily   simethicone  80 mg Oral QID   Continuous Infusions:  sodium chloride 100 mL/hr at 03/14/21 1049     LOS: 0 days   Time spent: 25 minutes.  Patrecia Pour, MD [facility] [email or web address] 03/14/2021, 2:05 PM

## 2021-03-14 NOTE — Progress Notes (Signed)
Patient stated to me that when she got done working with PT she has been experiencing some blurry vision. She closed her eyes and that kind of helped told her I would let MD know so he is aware.
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
## 2021-03-14 NOTE — Progress Notes (Addendum)
Physical Therapy Evaluation Patient Details Name: Emily Sweeney MRN: [id number] DOB: Jun 10, 1941 Today's Date: 03/14/2021   History of Present Illness  Pt is an 80yo female [facility] to [facility] ED 7/31 with chief complaint of dizziness, weakness, and right-sided chest pain. Possibly secondary to hyponatremia. Of note, she reported two falls prior to admission. No significant findings on imagining. PMH: HTN, lumbar radiculopathy, DM, vertigo (per ED note).  Clinical Impression  Pt presents with the impairments above and problems below. Pt required supervision A for transfers and min guard assist for gait in the hallway. Pt denied symptoms during mobility tasks. Educated on pacing techniques and utilization of RW post d/c for safety. Pt's daughter is available to assist 24/7 at home at d/c; recommending HHPT. We will continue to follow her acutely as she will benefit from acute skilled therapy to promote independence with functional mobility.    Follow Up Recommendations Home health PT    Equipment Recommendations  3in1 (PT)    Recommendations for Other Services       Precautions / Restrictions Precautions Precautions: Fall Precaution Comments: two falls immediately prior to admission; pt reported additional fall in the last month while walking dog, two neighbors were able to return her to standing. Restrictions Weight Bearing Restrictions: No      Mobility  Bed Mobility Overal bed mobility: Modified Independent             General bed mobility comments: Pt was in chair upon entry; placed in chair upon exit for lunch    Transfers Overall transfer level: Needs assistance Equipment used: None Transfers: Sit to/from Stand Sit to Stand: Supervision         General transfer comment: Pt supervised during sit to stand transfer for line management; pt mildly impulsive and stood from chair prior to instructions  Ambulation/Gait Ambulation/Gait assistance: Min guard Gait Distance  (Feet): 150 Feet Assistive device: IV Pole Gait Pattern/deviations: Step-through pattern;Decreased step length - right;Decreased step length - left;Drifts right/left Gait velocity: decreased   General Gait Details: Pt ambulated pushing IV pole with min gaurd assist for safety.  Pt reporting she was "maxed out" which limited further mobility.  Pt reported that walking causes pain secondary to bunions. Tended to drift during ambulation. Educated about using RW at home for increased safety. Pt asymptomatic throughout.  Stairs            Wheelchair Mobility    Modified Rankin (Stroke Patients Only)       Balance Overall balance assessment: Needs assistance Sitting-balance support: Feet supported;No upper extremity supported Sitting balance-Leahy Scale: Good     Standing balance support: Single extremity supported;No upper extremity supported Standing balance-Leahy Scale: Fair Standing balance comment: Able to maintain static standing without UE support                             Pertinent Vitals/Pain Pain Assessment: No/denies pain    Home Living Family/patient expects to be discharged to:: Private residence Living Arrangements: Alone Available Help at Discharge: Family;Available 24 hours/day Type of Home: House Home Access: Stairs to enter Entrance Stairs-Rails: Can reach both Entrance Stairs-Number of Steps: 2+2 Home Layout: Two level;Able to live on main level with bedroom/bathroom Home Equipment: Gilford Rile - 2 wheels;Grab bars - tub/shower      Prior Function Level of Independence: Independent         Comments: Works as Engineer, water, has recently adopted a Engineer, materials, bimonthly house cleaner  Hand Dominance   Dominant Hand: Right    Extremity/Trunk Assessment   Upper Extremity Assessment Upper Extremity Assessment: Defer to OT evaluation    Lower Extremity Assessment Lower Extremity Assessment: Generalized weakness;RLE deficits/detail;LLE  deficits/detail RLE Deficits / Details: Grossly 3+/5 throughout.  Hip and knee muscle testing flared up back pain LLE Deficits / Details: Grossly 4-/5 throughout. Hip and knee muscle testing flared up back pain    Cervical / Trunk Assessment Cervical / Trunk Assessment: Normal  Communication   Communication: No difficulties  Cognition Arousal/Alertness: Awake/alert Behavior During Therapy: WFL for tasks assessed/performed Overall Cognitive Status: Within Functional Limits for tasks assessed                                 General Comments: A/O x4, very sharp      General Comments General comments (skin integrity, edema, etc.): some bruising on right wrist and forearm and bandage on L forearm;  daughter Emily Sweeney in the room    Exercises     Assessment/Plan    PT Assessment Patient needs continued PT services  PT Problem List Decreased strength;Decreased activity tolerance;Decreased balance;Decreased mobility;Decreased knowledge of use of DME;Decreased safety awareness;Pain       PT Treatment Interventions DME instruction;Gait training;Stair training;Functional mobility training;Therapeutic activities;Therapeutic exercise;Balance training;Patient/family education    PT Goals (Current goals can be found in the Care Plan section)  Acute Rehab PT Goals Patient Stated Goal: to go home PT Goal Formulation: With patient/family Time For Goal Achievement: 03/28/21 Potential to Achieve Goals: Good    Frequency Min 3X/week   Barriers to discharge        Co-evaluation               AM-PAC PT "6 Clicks" Mobility  Outcome Measure Help needed turning from your back to your side while in a flat bed without using bedrails?: None Help needed moving from lying on your back to sitting on the side of a flat bed without using bedrails?: None Help needed moving to and from a bed to a chair (including a wheelchair)?: A Little Help needed standing up from a chair using your  arms (e.g., wheelchair or bedside chair)?: A Little Help needed to walk in hospital room?: A Little Help needed climbing 3-5 steps with a railing? : A Lot 6 Click Score: 19    End of Session Equipment Utilized During Treatment: Gait belt Activity Tolerance: Patient limited by fatigue Patient left: in chair;with call bell/phone within reach;with family/visitor present Nurse Communication: Mobility status PT Visit Diagnosis: Muscle weakness (generalized) (M62.81);History of falling (Z91.81);Other abnormalities of gait and mobility (R26.89)    Time: 1100-1126 PT Time Calculation (min) (ACUTE ONLY): 26 min   Charges:   PT Evaluation $PT Eval Low Complexity: 1 Low PT Treatments $Gait Training: 8-22 mins       Dawayne Cirri, SPT  Emily Sweeney 03/14/2021, 12:15 PM

## 2021-03-15 DIAGNOSIS — E871 Hypo-osmolality and hyponatremia: Secondary | ICD-10-CM | POA: Diagnosis not present

## 2021-03-15 DIAGNOSIS — M5416 Radiculopathy, lumbar region: Secondary | ICD-10-CM | POA: Diagnosis not present

## 2021-03-15 DIAGNOSIS — R42 Dizziness and giddiness: Secondary | ICD-10-CM | POA: Diagnosis not present

## 2021-03-15 DIAGNOSIS — E538 Deficiency of other specified B group vitamins: Secondary | ICD-10-CM | POA: Diagnosis not present

## 2021-03-15 LAB — BASIC METABOLIC PANEL
Anion gap: 10 (ref 5–15)
BUN: 13 mg/dL (ref 8–23)
CO2: 24 mmol/L (ref 22–32)
Calcium: 9.2 mg/dL (ref 8.9–10.3)
Chloride: 92 mmol/L — ABNORMAL LOW (ref 98–111)
Creatinine, Ser: 0.95 mg/dL (ref 0.44–1.00)
GFR, Estimated: >60 mL/min (ref >60)
Glucose, Bld: 109 mg/dL — ABNORMAL HIGH (ref 70–99)
Potassium: 3.7 mmol/L (ref 3.5–5.1)
Sodium: 126 mmol/L — ABNORMAL LOW (ref 135–145)

## 2021-03-15 LAB — IRON AND TIBC
Iron: 51 ug/dL (ref 28–170)
Saturation Ratios: 12 % (ref 10.4–31.8)
TIBC: 440 ug/dL (ref 250–450)
UIBC: 389 ug/dL

## 2021-03-15 LAB — CBC
HCT: 33.2 % — ABNORMAL LOW (ref 36.0–46.0)
Hemoglobin: 10.9 g/dL — ABNORMAL LOW (ref 12.0–15.0)
MCH: 26.7 pg (ref 26.0–34.0)
MCHC: 32.8 g/dL (ref 30.0–36.0)
MCV: 81.2 fL (ref 80.0–100.0)
Platelets: 257 10*3/uL (ref 150–400)
RBC: 4.09 MIL/uL (ref 3.87–5.11)
RDW: 16.3 % — ABNORMAL HIGH (ref 11.5–15.5)
WBC: 5.9 10*3/uL (ref 4.0–10.5)
nRBC: 0 % (ref 0.0–0.2)

## 2021-03-15 LAB — FERRITIN: Ferritin: 12 ng/mL (ref 11–307)

## 2021-03-15 LAB — OSMOLALITY: Osmolality: 264 mOsm/kg — ABNORMAL LOW (ref 275–295)

## 2021-03-15 LAB — SODIUM, URINE, RANDOM: Sodium, Ur: 19 mmol/L

## 2021-03-15 LAB — OSMOLALITY, URINE: Osmolality, Ur: 161 mOsm/kg — ABNORMAL LOW (ref 300–900)

## 2021-03-15 MED ORDER — COVID-19 MRNA VAC-TRIS(PFIZER) 30 MCG/0.3ML IM SUSP
0.3000 mL | Freq: Once | INTRAMUSCULAR | Status: AC
  Administered 2021-03-15: 0.3 mL via INTRAMUSCULAR
  Filled 2021-03-15: qty 0.3

## 2021-03-15 MED ORDER — UREA 15 G PO PACK
15.0000 g | PACK | Freq: Two times a day (BID) | ORAL | Status: DC
  Administered 2021-03-15 (×2): 15 g via ORAL
  Filled 2021-03-15 (×4): qty 1

## 2021-03-15 NOTE — Progress Notes (Signed)
PROGRESS NOTE  Emily Sweeney  [id number] DOB: 1940/12/13 DOA: 03/13/2021 PCP: Binnie Rail, MD   Brief Narrative: Emily Sweeney is an 80 y.o. female with a history of HTN, HLD, sciatica, B2 deficiency, GERD who presented to the ED 7/31 with progressive fatigue, weakness, poor appetite for a couple months with increasingly frequent falls at home. CT head nonacute. Sodium found to be newly low at 124. Pt admitted, HCTZ held, and sodium levels have not improved appreciably. she remains with some dizziness and blurry vision.   Assessment & Plan: Principal Problem:   Dizziness Active Problems:   Dyslipidemia   Essential hypertension   B12 deficiency   Chronic lumbar radiculopathy   Diabetes mellitus without complication (HCC)   Hyponatremia  Hyponatremia, dizziness: Multifactorial, certainly with an element of dietary imbalance/low relative solute intake. Thiazide diuretic likely contributing. Relative adrenal insufficiency may be at play with concomitant hypotension and 2 recent prednisone tapers, though random cortisol level was 11.7. TSH wnl 1.566.  - Hold HCTZ. Impaired urinary dilution is improving based on today's urine labs, though sodium not budging. Will add urea, continue fluid restriction. As long as sodium level improves in next 24 hours, can follow up as outpatient. Could alternatively consider salt tabs and/or low dose lasix.  Hypotension in setting of essential HTN: Improved, normotensive. - Holding HCTZ/plan to discontinue. - Will continue to hold ramipril and metoprolol as well.  Hypokalemia:  - Supplement and monitor in AM.  HLD:  - Continue statin  GERD:  - Continue PPI  Sciatica:  - Continue outpatient Tx. Currently quiescent.  - When treating with oral steroids in the future, suggest as low of dose as possible and protracted tapering.  - Pt not taking gabapentin, will DC order.  Vitamin B12 deficiency: Level sufficient at 413. - Continue supplement    Chest pain: Resolved, atypical. Troponins negative, ECG reassuring. ACS ruled out. DC telemetry (revealed very rare PACs and occasional PR prolongation without dropped beats) to facilitate safe mobility.  Microcytic anemia: Mild, stable. Iron stores are borderline.  - Will need to discuss oral iron supplementation vs. dietary fortification.   DVT prophylaxis: Lovenox Code Status: Full Family Communication: Daughter at bedside Disposition Plan:  Status is: Inpatient  Remains inpatient appropriate because:Persistent severe electrolyte disturbances. Remains with persistent hyponatremia requiring intervention and frequent monitoring. Anticipate DC 8/3 once improvement is noted.  Dispo: The patient is from: Home              Anticipated d/c is to: Home. Has secured PCP follow up on 8/9.              Patient currently is not medically stable to d/c.   Difficult to place patient No  Consultants:  Nephrology, Dr. Joylene Grapes by phone only.  Procedures:  None  Antimicrobials: None   Subjective: Some intermittent blurry vision remains, working with PT and getting up with assistance without LOB. No appreciable change in dizziness. While ambulating yesterday, brushed up against the door frame inadvertently.   Objective: Vitals:   03/14/21 1408 03/14/21 1758 03/15/21 0025 03/15/21 0513  BP: (!) 91/59 (!) 92/59 130/81 136/66  Pulse: 65 68 72 67  Resp:  '16 18 18  '$ Temp:  (!) 97.4 F (36.3 C) 98 F (36.7 C) 97.8 F (36.6 C)  TempSrc:  Oral Oral Oral  SpO2:  91% 98% 99%  Weight:      Height:        Intake/Output Summary (Last 24 hours) at 03/15/2021 1144  Last data filed at 03/15/2021 0514 Gross per 24 hour  Intake --  Output 800 ml  Net -800 ml   Filed Weights   03/13/21 2320  Weight: 64.8 kg   Gen: WDWN elderly female in no distress Pulm: Nonlabored breathing room air. Clear. CV: Regular rate and rhythm. No murmur, rub, or gallop. No JVD, no dependent edema. GI: Abdomen soft,  non-tender, non-distended, with normoactive bowel sounds.  Ext: Warm, no deformities Skin: Small abrasion on left elbow, no other new rashes, lesions or ulcers on visualized skin. Neuro: Alert and oriented. No focal neurological deficits. Psych: Judgement and insight appear fair. Mood euthymic & affect congruent. Behavior is appropriate.    Data Reviewed: I have personally reviewed following labs and imaging studies  CBC: Recent Labs  Lab 03/13/21 1135 03/14/21 0541 03/15/21 0301  WBC 6.8 5.3 5.9  NEUTROABS 4.6  --   --   HGB 11.8* 10.7* 10.9*  HCT 35.6* 32.2* 33.2*  MCV 80.2 79.7* 81.2  PLT 271 217 [id number]   Basic Metabolic Panel: Recent Labs  Lab 03/13/21 1852 03/14/21 0041 03/14/21 0541 03/14/21 1023 03/15/21 0301  NA 125* 125* 126* 125* 126*  K 3.6 3.3* 3.5 3.4* 3.7  CL 87* 88* 90* 91* 92*  CO2 '26 26 27 24 24  '$ GLUCOSE 123* 152* 114* 121* 109*  BUN '13 12 12 12 13  '$ CREATININE 0.96 1.02* 0.95 0.96 0.95  CALCIUM 9.3 9.4 9.0 8.8* 9.2   GFR: Estimated Creatinine Clearance: 46 mL/min (by C-G formula based on SCr of 0.95 mg/dL). Liver Function Tests: No results for input(s): AST, ALT, ALKPHOS, BILITOT, PROT, ALBUMIN in the last 168 hours. No results for input(s): LIPASE, AMYLASE in the last 168 hours. No results for input(s): AMMONIA in the last 168 hours. Coagulation Profile: No results for input(s): INR, PROTIME in the last 168 hours. Cardiac Enzymes: No results for input(s): CKTOTAL, CKMB, CKMBINDEX, TROPONINI in the last 168 hours. BNP (last 3 results) No results for input(s): PROBNP in the last 8760 hours. HbA1C: No results for input(s): HGBA1C in the last 72 hours. CBG: No results for input(s): GLUCAP in the last 168 hours. Lipid Profile: No results for input(s): CHOL, HDL, LDLCALC, TRIG, CHOLHDL, LDLDIRECT in the last 72 hours. Thyroid Function Tests: Recent Labs    03/13/21 1815  TSH 1.566   Anemia Panel: Recent Labs    03/13/21 1852 03/15/21 0301   VITAMINB12 413  --   FERRITIN  --  12  TIBC  --  440  IRON  --  51   Urine analysis:    Component Value Date/Time   COLORURINE YELLOW 07/09/2017 1530   APPEARANCEUR [place] (A) 07/09/2017 1530   LABSPEC 1.025 07/09/2017 1530   PHURINE 5.5 07/09/2017 1530   GLUCOSEU 100 (A) 07/09/2017 1530   HGBUR NEGATIVE 07/09/2017 1530   HGBUR negative 03/26/2010 0938   BILIRUBINUR negative 12/29/2017 [place] 07/09/2017 1530   PROTEINUR 1+ 12/29/2017 1244   UROBILINOGEN 0.2 12/29/2017 1244   UROBILINOGEN 0.2 07/09/2017 1530   NITRITE negative 12/29/2017 1244   NITRITE POSITIVE (A) 07/09/2017 1530   LEUKOCYTESUR Negative 12/29/2017 1244   Recent Results (from the past 240 hour(s))  SARS CORONAVIRUS 2 (TAT 6-24 HRS) Nasopharyngeal Nasopharyngeal Swab     Status: None   Collection Time: 03/14/21  3:51 AM   Specimen: Nasopharyngeal Swab  Result Value Ref Range Status   SARS Coronavirus 2 NEGATIVE NEGATIVE Final    Comment: (NOTE)  SARS-CoV-2 target nucleic acids are NOT DETECTED.  The SARS-CoV-2 RNA is generally detectable in upper and lower respiratory specimens during the acute phase of infection. Negative results do not preclude SARS-CoV-2 infection, do not rule out co-infections with other pathogens, and should not be used as the sole basis for treatment or other patient management decisions. Negative results must be combined with clinical observations, patient history, and epidemiological information. The expected result is Negative.  Fact Sheet for Patients: SugarRoll.be  Fact Sheet for Healthcare Providers: https://www.woods-mathews.com/  This test is not yet approved or cleared by the [place] FDA and  has been authorized for detection and/or diagnosis of SARS-CoV-2 by FDA under an Emergency Use Authorization (EUA). This EUA will remain  in effect (meaning this test can be used) for the duration of the COVID-19  declaration under Se ction 564(b)(1) of the Act, 21 U.S.C. section 360bbb-3(b)(1), unless the authorization is terminated or revoked sooner.  Performed at [facility], [place] [address], [place], [place] [address]       Radiology Studies: DG Chest 2 View  Result Date: 03/13/2021 CLINICAL DATA:  Chest pressure.  Shortness of breath. EXAM: CHEST - 2 VIEW COMPARISON:  Chest radiograph 06/13/2018. FINDINGS: Stable cardiac and mediastinal contours. Bibasilar airspace opacities. No pleural effusion or pneumothorax. Thoracic spine degenerative changes. IMPRESSION: Bibasilar heterogeneous opacities favored to represent atelectasis. Electronically Signed   By: Lovey Newcomer M.D.   On: 03/13/2021 12:17   CT Head Wo Contrast  Result Date: 03/13/2021 CLINICAL DATA:  Dizziness, weakness.  Stroke suspected EXAM: CT HEAD WITHOUT CONTRAST TECHNIQUE: Contiguous axial images were obtained from the base of the skull through the vertex without intravenous contrast. COMPARISON:  06/13/2018 FINDINGS: Brain: There is atrophy and chronic small vessel disease changes. No acute intracranial abnormality. Specifically, no hemorrhage, hydrocephalus, mass lesion, acute infarction, or significant intracranial injury. Vascular: No hyperdense vessel or unexpected calcification. Skull: No acute calvarial abnormality. Sinuses/Orbits: No acute findings Other: None IMPRESSION: Atrophy, chronic microvascular disease. No acute intracranial abnormality. Electronically Signed   By: Rolm Baptise M.D.   On: 03/13/2021 15:20    Scheduled Meds:  aspirin EC  81 mg Oral Daily   atorvastatin  20 mg Oral QHS   COVID-19 mRNA Vac-TriS (Pfizer)  0.3 mL Intramuscular Once   enoxaparin (LOVENOX) injection  40 mg Subcutaneous Q24H   hydrocortisone cream   Topical BID   pantoprazole  40 mg Oral BID AC   simethicone  80 mg Oral QID   urea  15 g Oral BID   Continuous Infusions:     LOS: 1 day   Time spent: 25 minutes.  Patrecia Pour,  MD [facility] [email or web address] 03/15/2021, 11:44 AM

## 2021-03-15 NOTE — TOC Transition Note (Signed)
Transition of Care [facility]) - CM/SW Discharge Note   Patient Details  Name: Emily Sweeney MRN: [id number] Date of Birth: May 08, 1941  [facility] of [facility]) CM/SW Contact:  Marilu Favre, RN Phone Number: 03/15/2021, 9:52 AM   Clinical Narrative:     Patient has decided on [facility] for home health . Messaged Tommi Rumps with [facility]  Final next level of care: [place]     Patient Goals and CMS Choice Patient states their goals for this hospitalization and ongoing recovery are:: to return to home CMS Medicare.gov Compare Post Acute Care list provided to:: Patient Choice offered to / list presented to : Patient, Adult Children  Discharge Placement                       Discharge Plan and Services     Post Acute Care Choice: Home Health, Durable Medical Equipment          DME Arranged: 3-N-1 DME Agency: [employer] Date DME Agency Contacted: 03/14/21 Time DME Agency Contacted: [phone] Representative spoke with at DME Agency: [place] Arranged: PT          Social Determinants of Health ([place]) Interventions     Readmission Risk Interventions No flowsheet data found.

## 2021-03-15 NOTE — Progress Notes (Signed)
Physical Therapy Treatment Patient Details Name: Emily Sweeney MRN: [id number] DOB: 1941-06-14 Today's Date: 03/15/2021    History of Present Illness Pt is an 80yo female presenting to [facility] ED 7/31 with chief complaint of dizziness, weakness, and right-sided chest pain. Possibly secondary to hyponatremia. Of note, she reported two falls prior to admission. No significant findings on imagining. PMH: HTN, lumbar radiculopathy, DM, vertigo (per ED note).    PT Comments    Patient received in recliner on her computer. Daughter present in room. Patient is agreeable to PT session. Stood from Psychologist, occupational with supervision. Ambulated 150 feet with no AD, min guard. No lob. Patient will continue to benefit from skilled PT while here to improve strength and safety with mobility.      Follow Up Recommendations  Home health PT     Equipment Recommendations  3in1 (PT)    Recommendations for Other Services       Precautions / Restrictions Precautions Precautions: Fall Precaution Comments: two falls immediately prior to admission; pt reported additional fall in the last month while walking dog, two neighbors were able to return her to standing. Restrictions Weight Bearing Restrictions: No    Mobility  Bed Mobility               General bed mobility comments: Pt was in chair upon entry; returned to recliner at end of session    Transfers Overall transfer level: Needs assistance Equipment used: None Transfers: Sit to/from Stand Sit to Stand: Supervision         General transfer comment: Patient stood prior to me being over near her  Ambulation/Gait Ambulation/Gait assistance: Min guard Gait Distance (Feet): 150 Feet Assistive device: None Gait Pattern/deviations: Step-through pattern;Decreased step length - right;Decreased step length - left;Narrow base of support Gait velocity: decreased   General Gait Details: patient fatigued after this distance. Requesting to turn  around.   Stairs             Wheelchair Mobility    Modified Rankin (Stroke Patients Only)       Balance Overall balance assessment: Needs assistance;History of Falls Sitting-balance support: Feet supported Sitting balance-Leahy Scale: Good     Standing balance support: No upper extremity supported;During functional activity Standing balance-Leahy Scale: Fair Standing balance comment: Able to maintain static standing without UE support                            Cognition Arousal/Alertness: Awake/alert Behavior During Therapy: WFL for tasks assessed/performed Overall Cognitive Status: Within Functional Limits for tasks assessed                                 General Comments: A/O x4, very sharp      Exercises      General Comments        Pertinent Vitals/Pain Pain Assessment: No/denies pain    Home Living                      Prior Function            PT Goals (current goals can now be found in the care plan section) Acute Rehab PT Goals Patient Stated Goal: to go home PT Goal Formulation: With patient/family Time For Goal Achievement: 03/28/21 Potential to Achieve Goals: Good Progress towards PT goals: Progressing toward goals    Frequency  Min 3X/week      PT Plan Current plan remains appropriate    Co-evaluation              AM-PAC PT "6 Clicks" Mobility   Outcome Measure  Help needed turning from your back to your side while in a flat bed without using bedrails?: None Help needed moving from lying on your back to sitting on the side of a flat bed without using bedrails?: None Help needed moving to and from a bed to a chair (including a wheelchair)?: A Little Help needed standing up from a chair using your arms (e.g., wheelchair or bedside chair)?: A Little Help needed to walk in hospital room?: A Little Help needed climbing 3-5 steps with a railing? : A Little 6 Click Score: 20    End  of Session Equipment Utilized During Treatment: Gait belt Activity Tolerance: Patient limited by fatigue Patient left: in chair;with call bell/phone within reach;with family/visitor present Nurse Communication: Mobility status PT Visit Diagnosis: Muscle weakness (generalized) (M62.81);History of falling (Z91.81);Other abnormalities of gait and mobility (R26.89)     Time: [id number] PT Time Calculation (min) (ACUTE ONLY): 11 min  Charges:  $Gait Training: 8-22 mins                    [employer], PT, [employer] 03/15/21,11:03 AM

## 2021-03-16 DIAGNOSIS — M5416 Radiculopathy, lumbar region: Secondary | ICD-10-CM | POA: Diagnosis not present

## 2021-03-16 DIAGNOSIS — R42 Dizziness and giddiness: Secondary | ICD-10-CM | POA: Diagnosis not present

## 2021-03-16 DIAGNOSIS — E538 Deficiency of other specified B group vitamins: Secondary | ICD-10-CM | POA: Diagnosis not present

## 2021-03-16 DIAGNOSIS — E871 Hypo-osmolality and hyponatremia: Secondary | ICD-10-CM | POA: Diagnosis not present

## 2021-03-16 LAB — BASIC METABOLIC PANEL
Anion gap: 10 (ref 5–15)
BUN: 37 mg/dL — ABNORMAL HIGH (ref 8–23)
CO2: 25 mmol/L (ref 22–32)
Calcium: 9.4 mg/dL (ref 8.9–10.3)
Chloride: 95 mmol/L — ABNORMAL LOW (ref 98–111)
Creatinine, Ser: 0.91 mg/dL (ref 0.44–1.00)
GFR, Estimated: >60 mL/min (ref >60)
Glucose, Bld: 111 mg/dL — ABNORMAL HIGH (ref 70–99)
Potassium: 3.6 mmol/L (ref 3.5–5.1)
Sodium: 130 mmol/L — ABNORMAL LOW (ref 135–145)

## 2021-03-16 MED ORDER — MELOXICAM 15 MG PO TABS: 15.0000 mg | ORAL_TABLET | Freq: Every day | ORAL | Status: DC | PRN

## 2021-03-16 NOTE — TOC Transition Note (Addendum)
Transition of Care [facility]) - CM/SW Discharge Note   Patient Details  Name: Emily Sweeney MRN: [id number] Date of Birth: [birth year]  Transition [facility]) CM/SW Contact:  Marilu Favre, RN Phone Number: 03/16/2021, 10:22 AM   Clinical Narrative:     Tommi Rumps with [facility] aware discharge today. 3 in1 was ordered 03/14/21  Patient states she did not receive 3 in 1 03/14/21.   Spoke to [place] with [employer], per [employer] records it was delivered 8/1 and signed for by H. Wynetta Emery. [employer] will call patient   Final next level of care: [place]     Patient Goals and CMS Choice Patient states their goals for this hospitalization and ongoing recovery are:: to return to home CMS Medicare.gov Compare Post Acute Care list provided to:: Patient Choice offered to / list presented to : Patient, Adult Children  Discharge Placement                       Discharge Plan and Services     Post Acute Care Choice: Home Health, Durable Medical Equipment          DME Arranged: 3-N-1 DME Agency: [employer] Date DME Agency Contacted: 03/14/21 Time DME Agency Contacted: [phone] Representative spoke with at DME Agency: [place] Arranged: PT          Social Determinants of Health ([place]) Interventions     Readmission Risk Interventions No flowsheet data found.

## 2021-03-16 NOTE — Discharge Summary (Signed)
Physician Discharge Summary  Emily Sweeney [id number] DOB: July 02, 1941 DOA: 03/13/2021  PCP: Binnie Rail, MD  Admit date: 03/13/2021 Discharge date: 03/16/2021  Admitted From: Home Disposition: Home   Recommendations for Outpatient Follow-up:  Follow up with PCP on 8/9 with repeat vital signs and BMP.  Home Health: PT Equipment/Devices: 3 in 1 Discharge Condition: Stable CODE STATUS: Full Diet recommendation: Carb modified  Brief/Interim Summary: Emily Sweeney is an 80 y.o. female with a history of HTN, HLD, sciatica, B12 deficiency, diet-controlled T2DM, GERD who presented to the ED 7/31 with progressive fatigue, weakness, poor appetite for a couple months with increasingly frequent falls at home. CT head nonacute. Sodium found to be newly low at 124. Pt admitted, HCTZ held, and over time sodium level improved to 130. PT has evaluated the patient, recommending home health which is arranged. The patient will follow up with PCP within the next week as scheduled.   Discharge Diagnoses:  Principal Problem:   Dizziness Active Problems:   Dyslipidemia   Essential hypertension   B12 deficiency   Chronic lumbar radiculopathy   Diabetes mellitus without complication (HCC)   Hyponatremia  Hyponatremia, dizziness: Multifactorial, certainly with an element of dietary imbalance/low relative solute intake. Thiazide diuretic likely contributing. Relative adrenal insufficiency was considered with concomitant hypotension and 2 recent prednisone tapers, though random cortisol level was 11.7. TSH wnl 1.566. - Held HCTZ with improvement in sodium level, improved urinary dilution. Urea given during 8/2 to accelerate improvement, will not be required at discharge.   Hypotension in setting of essential HTN: Improved, normotensive. - Stop HCTZ - Continue ramipril, metoprolol   Hypokalemia: - Supplemented and resolved.    HLD: - Continue statin   GERD: - Continue PPI   Diet-controlled  T2DM: Last HbA1c 6.9%.  - PCP follow up  Sciatica: - Continue outpatient Tx. Currently quiescent. - When treating with oral steroids in the future, suggest as low of dose as possible and protracted tapering.   Vitamin B12 deficiency: Level sufficient at 413. - Continue supplement   Chest pain: Resolved, atypical. Troponins negative, ECG reassuring. ACS ruled out.    Microcytic anemia: Mild, stable. Iron stores are borderline. - Will need to discuss oral iron supplementation vs. dietary fortification.   Discharge Instructions Discharge Instructions     Diet general   Complete by: As directed    Discharge instructions   Complete by: As directed    Continue to eat and drink normally, stop taking hydrochlorothiazide (HCTZ), and follow up with Dr. Quay Burow next week. If your dizziness worsens or you notice new headache or confusion, seek medical attention right away.   No wound care   Complete by: As directed       Allergies as of 03/16/2021       Reactions   Macrobid [nitrofurantoin Macrocrystal] Other (See Comments)   Fever, pain, fatigue, no appetite   Norvasc [amlodipine Besylate]    Palpitations, low bp   Ceftin [cefuroxime Axetil] Hives, Rash   Ciprofloxacin Rash   Penicillins Hives, Rash   Has patient had a PCN reaction causing immediate rash, facial/tongue/throat swelling, SOB or lightheadedness with hypotension: No Has patient had a PCN reaction causing severe rash involving mucus membranes or skin necrosis: Fayrene Fearing Has patient had a PCN reaction that required hospitalization No Has patient had a PCN reaction occurring within the last 10 years: No If all of the above answers are "NO", then may proceed with Cephalosporin use.   Sulfonamide Derivatives Hives, Rash  Medication List     STOP taking these medications    gabapentin 100 MG capsule Commonly known as: NEURONTIN   hydrochlorothiazide 25 MG tablet Commonly known as: HYDRODIURIL       TAKE  these medications    aspirin EC 81 MG tablet Take 1 tablet (81 mg total) by mouth daily.   atorvastatin 20 MG tablet Commonly known as: LIPITOR TAKE 1 TABLET ONCE DAILY.   fluticasone 50 MCG/ACT nasal spray Commonly known as: FLONASE Place 1 spray into both nostrils as needed for allergies or rhinitis.   Hair Skin and Nails Formula Tabs Take 1 tablet by mouth daily.   meloxicam 15 MG tablet Commonly known as: MOBIC Take 1 tablet (15 mg total) by mouth daily as needed for pain.   metoprolol succinate 25 MG 24 hr tablet Commonly known as: TOPROL-XL TAKE 1 TABLET ONCE DAILY.   pantoprazole 40 MG tablet Commonly known as: PROTONIX Take 1 tablet (40 mg total) by mouth daily.   PROBIOTIC ADVANCED PO Take 1 capsule by mouth daily.   ramipril 10 MG capsule Commonly known as: ALTACE Take 1 capsule (10 mg total) by mouth daily.   simethicone 80 MG chewable tablet Commonly known as: MYLICON Chew 80 mg by mouth every 6 (six) hours as needed for flatulence.   SYSTANE BALANCE OP Place 1 drop into both eyes daily as needed (dry eyes).   VITAMIN B COMPLEX PO Take 1 tablet by mouth daily.   VITAMIN D (CHOLECALCIFEROL) PO Take 5,000 Units by mouth daily.               Durable Medical Equipment  (From admission, onward)           Start     Ordered   03/16/21 [id number]  DME 3-in-1  Once        03/16/21 0813   03/14/21 1446  For home use only DME 3 n 1  Once        03/14/21 1445            Follow-up Information     [facility] Follow up.   Specialty: Home Health Services Contact information: [place] [address] [phone]         Binnie Rail, MD. Go on 03/22/2021.   Specialty: Internal Medicine Contact information: [place] [address] [phone]                Allergies  Allergen Reactions   Macrobid [Nitrofurantoin Macrocrystal] Other (See Comments)    Fever, pain,  fatigue, no appetite   Norvasc [Amlodipine Besylate]     Palpitations, low bp   Ceftin [Cefuroxime Axetil] Hives and Rash   Ciprofloxacin Rash   Penicillins Hives and Rash    Has patient had a PCN reaction causing immediate rash, facial/tongue/throat swelling, SOB or lightheadedness with hypotension: No Has patient had a PCN reaction causing severe rash involving mucus membranes or skin necrosis: Fayrene Fearing Has patient had a PCN reaction that required hospitalization No Has patient had a PCN reaction occurring within the last 10 years: No If all of the above answers are "NO", then may proceed with Cephalosporin use.    Sulfonamide Derivatives Hives and Rash    Consultations: Nephrology, Dr. Joylene Grapes by phone  Procedures/Studies: DG Chest 2 View  Result Date: 03/13/2021 CLINICAL DATA:  Chest pressure.  Shortness of breath. EXAM: CHEST - 2 VIEW COMPARISON:  Chest radiograph 06/13/2018. FINDINGS: Stable  cardiac and mediastinal contours. Bibasilar airspace opacities. No pleural effusion or pneumothorax. Thoracic spine degenerative changes. IMPRESSION: Bibasilar heterogeneous opacities favored to represent atelectasis. Electronically Signed   By: Lovey Newcomer M.D.   On: 03/13/2021 12:17   CT Head Wo Contrast  Result Date: 03/13/2021 CLINICAL DATA:  Dizziness, weakness.  Stroke suspected EXAM: CT HEAD WITHOUT CONTRAST TECHNIQUE: Contiguous axial images were obtained from the base of the skull through the vertex without intravenous contrast. COMPARISON:  06/13/2018 FINDINGS: Brain: There is atrophy and chronic small vessel disease changes. No acute intracranial abnormality. Specifically, no hemorrhage, hydrocephalus, mass lesion, acute infarction, or significant intracranial injury. Vascular: No hyperdense vessel or unexpected calcification. Skull: No acute calvarial abnormality. Sinuses/Orbits: No acute findings Other: None IMPRESSION: Atrophy, chronic microvascular disease. No acute intracranial  abnormality. Electronically Signed   By: Rolm Baptise M.D.   On: 03/13/2021 15:20      Subjective: Feels well, dressed in regular clothes. Wants to go home.   Discharge Exam: Vitals:   03/15/21 2327 03/16/21 0514  BP: 140/73 128/62  Pulse: 68 76  Resp: 17 18  Temp: 98.2 F (36.8 C) 98.2 F (36.8 C)  SpO2: 97% 99%   General: Pt is alert, awake, not in acute distress Cardiovascular: RRR, S1/S2 +, no rubs, no gallops Respiratory: CTA bilaterally, no wheezing, no rhonchi Abdominal: Soft, NT, ND, bowel sounds + Extremities: No edema, no cyanosis  Labs: BNP (last 3 results) No results for input(s): BNP in the last 8760 hours. Basic Metabolic Panel: Recent Labs  Lab 03/14/21 0041 03/14/21 0541 03/14/21 1023 03/15/21 0301 03/16/21 0337  NA 125* 126* 125* 126* 130*  K 3.3* 3.5 3.4* 3.7 3.6  CL 88* 90* 91* 92* 95*  CO2 '26 27 24 24 25  '$ GLUCOSE 152* 114* 121* 109* 111*  BUN '12 12 12 13 '$ 37*  CREATININE 1.02* 0.95 0.96 0.95 0.91  CALCIUM 9.4 9.0 8.8* 9.2 9.4   Liver Function Tests: No results for input(s): AST, ALT, ALKPHOS, BILITOT, PROT, ALBUMIN in the last 168 hours. No results for input(s): LIPASE, AMYLASE in the last 168 hours. No results for input(s): AMMONIA in the last 168 hours. CBC: Recent Labs  Lab 03/13/21 1135 03/14/21 0541 03/15/21 0301  WBC 6.8 5.3 5.9  NEUTROABS 4.6  --   --   HGB 11.8* 10.7* 10.9*  HCT 35.6* 32.2* 33.2*  MCV 80.2 79.7* 81.2  PLT 271 217 257   Cardiac Enzymes: No results for input(s): CKTOTAL, CKMB, CKMBINDEX, TROPONINI in the last 168 hours. BNP: Invalid input(s): POCBNP CBG: No results for input(s): GLUCAP in the last 168 hours. D-Dimer No results for input(s): DDIMER in the last 72 hours. Hgb A1c No results for input(s): HGBA1C in the last 72 hours. Lipid Profile No results for input(s): CHOL, HDL, LDLCALC, TRIG, CHOLHDL, LDLDIRECT in the last 72 hours. Thyroid function studies Recent Labs    03/13/21 1815  TSH 1.566    Anemia work up Recent Labs    03/13/21 1852 03/15/21 0301  VITAMINB12 413  --   FERRITIN  --  12  TIBC  --  440  IRON  --  51   Urinalysis    Component Value Date/Time   COLORURINE YELLOW 07/09/2017 1530   APPEARANCEUR [place] (A) 07/09/2017 1530   LABSPEC 1.025 07/09/2017 1530   PHURINE 5.5 07/09/2017 1530   GLUCOSEU 100 (A) 07/09/2017 [place] NEGATIVE 07/09/2017 1530   HGBUR negative 03/26/2010 [place] negative 12/29/2017  [place] 07/09/2017 1530   PROTEINUR 1+ 12/29/2017 1244   UROBILINOGEN 0.2 12/29/2017 1244   UROBILINOGEN 0.2 07/09/2017 1530   NITRITE negative 12/29/2017 1244   NITRITE POSITIVE (A) 07/09/2017 1530   LEUKOCYTESUR Negative 12/29/2017 1244    Microbiology Recent Results (from the past 240 hour(s))  SARS CORONAVIRUS 2 (TAT 6-24 HRS) Nasopharyngeal Nasopharyngeal Swab     Status: None   Collection Time: 03/14/21  3:51 AM   Specimen: Nasopharyngeal Swab  Result Value Ref Range Status   SARS Coronavirus 2 NEGATIVE NEGATIVE Final    Comment: (NOTE) SARS-CoV-2 target nucleic acids are NOT DETECTED.  The SARS-CoV-2 RNA is generally detectable in upper and lower respiratory specimens during the acute phase of infection. Negative results do not preclude SARS-CoV-2 infection, do not rule out co-infections with other pathogens, and should not be used as the sole basis for treatment or other patient management decisions. Negative results must be combined with clinical observations, patient history, and epidemiological information. The expected result is Negative.  Fact Sheet for Patients: SugarRoll.be  Fact Sheet for Healthcare Providers: https://www.woods-mathews.com/  This test is not yet approved or cleared by the [place] FDA and  has been authorized for detection and/or diagnosis of SARS-CoV-2 by FDA under an Emergency Use Authorization (EUA). This EUA will remain   in effect (meaning this test can be used) for the duration of the COVID-19 declaration under Se ction 564(b)(1) of the Act, 21 U.S.C. section 360bbb-3(b)(1), unless the authorization is terminated or revoked sooner.  Performed at [facility], [place] [address], [place], [place] [address]     Time coordinating discharge: Approximately 40 minutes  Patrecia Pour, MD  [facility] 03/16/2021, 8:14 AM

## 2021-03-18 ENCOUNTER — Telehealth: Payer: Self-pay

## 2021-03-18 DIAGNOSIS — M47818 Spondylosis without myelopathy or radiculopathy, sacral and sacrococcygeal region: Secondary | ICD-10-CM | POA: Diagnosis not present

## 2021-03-18 DIAGNOSIS — E1140 Type 2 diabetes mellitus with diabetic neuropathy, unspecified: Secondary | ICD-10-CM | POA: Diagnosis not present

## 2021-03-18 DIAGNOSIS — Z87440 Personal history of urinary (tract) infections: Secondary | ICD-10-CM | POA: Diagnosis not present

## 2021-03-18 DIAGNOSIS — G319 Degenerative disease of nervous system, unspecified: Secondary | ICD-10-CM | POA: Diagnosis not present

## 2021-03-18 DIAGNOSIS — E871 Hypo-osmolality and hyponatremia: Secondary | ICD-10-CM | POA: Diagnosis not present

## 2021-03-18 DIAGNOSIS — K219 Gastro-esophageal reflux disease without esophagitis: Secondary | ICD-10-CM | POA: Diagnosis not present

## 2021-03-18 DIAGNOSIS — I10 Essential (primary) hypertension: Secondary | ICD-10-CM | POA: Diagnosis not present

## 2021-03-18 DIAGNOSIS — Z87891 Personal history of nicotine dependence: Secondary | ICD-10-CM | POA: Diagnosis not present

## 2021-03-18 DIAGNOSIS — Z79899 Other long term (current) drug therapy: Secondary | ICD-10-CM | POA: Diagnosis not present

## 2021-03-18 DIAGNOSIS — M47817 Spondylosis without myelopathy or radiculopathy, lumbosacral region: Secondary | ICD-10-CM | POA: Diagnosis not present

## 2021-03-18 DIAGNOSIS — D509 Iron deficiency anemia, unspecified: Secondary | ICD-10-CM | POA: Diagnosis not present

## 2021-03-18 DIAGNOSIS — Z9181 History of falling: Secondary | ICD-10-CM | POA: Diagnosis not present

## 2021-03-18 DIAGNOSIS — E785 Hyperlipidemia, unspecified: Secondary | ICD-10-CM | POA: Diagnosis not present

## 2021-03-18 DIAGNOSIS — M47814 Spondylosis without myelopathy or radiculopathy, thoracic region: Secondary | ICD-10-CM | POA: Diagnosis not present

## 2021-03-18 DIAGNOSIS — K5730 Diverticulosis of large intestine without perforation or abscess without bleeding: Secondary | ICD-10-CM | POA: Diagnosis not present

## 2021-03-18 DIAGNOSIS — Z791 Long term (current) use of non-steroidal anti-inflammatories (NSAID): Secondary | ICD-10-CM | POA: Diagnosis not present

## 2021-03-18 DIAGNOSIS — M4726 Other spondylosis with radiculopathy, lumbar region: Secondary | ICD-10-CM | POA: Diagnosis not present

## 2021-03-18 DIAGNOSIS — E538 Deficiency of other specified B group vitamins: Secondary | ICD-10-CM | POA: Diagnosis not present

## 2021-03-18 DIAGNOSIS — H9191 Unspecified hearing loss, right ear: Secondary | ICD-10-CM | POA: Diagnosis not present

## 2021-03-18 DIAGNOSIS — Z7982 Long term (current) use of aspirin: Secondary | ICD-10-CM | POA: Diagnosis not present

## 2021-03-18 DIAGNOSIS — I959 Hypotension, unspecified: Secondary | ICD-10-CM | POA: Diagnosis not present

## 2021-03-18 DIAGNOSIS — K449 Diaphragmatic hernia without obstruction or gangrene: Secondary | ICD-10-CM | POA: Diagnosis not present

## 2021-03-18 NOTE — Telephone Encounter (Cosign Needed)
Transition Care Management Follow-up Telephone Call Date of discharge and from where: 03/16/2021 from [facility] How have you been since you were released from the hospital? Doing overall better Any questions or concerns? Yes; regarding sodium levels  Items Reviewed: Did the pt receive and understand the discharge instructions provided? Yes  Medications obtained and verified? Yes  Other? No  Any new allergies since your discharge? No  Dietary orders reviewed? Yes; carb modified Do you have support at home? Yes   Home Care and Equipment/Supplies: Were home health services ordered? yes If so, what is the name of the agency? [facility]   Has the agency set up a time to come to the patient's home? Yes, 03/18/2021 for physical therapy Were any new equipment or medical supplies ordered?  Yes: elevated toilet seat What is the name of the medical supply agency? [facility] Were you able to get the supplies/equipment? Yes; has not used it Do you have any questions related to the use of the equipment or supplies? No  Functional Questionnaire: (I = Independent and D = Dependent) ADLs: I  Bathing/Dressing- I  Meal Prep- I  Eating- I  Maintaining continence- I  Transferring/Ambulation- I  Managing Meds- I  Follow up appointments reviewed:  PCP Hospital f/u appt confirmed? Yes  Scheduled to see Billey Gosling, MD on 03/22/2021 @ 11:00 am. [facility] f/u appt confirmed? No   Are transportation arrangements needed? No  If their condition worsens, is the pt aware to call PCP or go to the Emergency Dept.? Yes Was the patient provided with contact information for the PCP's office or ED? Yes Was to pt encouraged to call back with questions or concerns? Yes

## 2021-03-18 NOTE — Telephone Encounter (Signed)
Spoke with patient today. 

## 2021-03-21 DIAGNOSIS — K219 Gastro-esophageal reflux disease without esophagitis: Secondary | ICD-10-CM | POA: Insufficient documentation

## 2021-03-21 DIAGNOSIS — E1140 Type 2 diabetes mellitus with diabetic neuropathy, unspecified: Secondary | ICD-10-CM | POA: Diagnosis not present

## 2021-03-21 DIAGNOSIS — M4726 Other spondylosis with radiculopathy, lumbar region: Secondary | ICD-10-CM | POA: Diagnosis not present

## 2021-03-21 DIAGNOSIS — M47817 Spondylosis without myelopathy or radiculopathy, lumbosacral region: Secondary | ICD-10-CM | POA: Diagnosis not present

## 2021-03-21 DIAGNOSIS — I959 Hypotension, unspecified: Secondary | ICD-10-CM | POA: Diagnosis not present

## 2021-03-21 DIAGNOSIS — E871 Hypo-osmolality and hyponatremia: Secondary | ICD-10-CM | POA: Diagnosis not present

## 2021-03-21 DIAGNOSIS — I10 Essential (primary) hypertension: Secondary | ICD-10-CM | POA: Diagnosis not present

## 2021-03-21 NOTE — Patient Instructions (Addendum)
  Blood work was ordered.     Medications changes include :   none   A referral was ordered for  Dr Mayer Camel.      Someone from their office will call you to schedule an appointment.

## 2021-03-21 NOTE — Progress Notes (Signed)
Subjective:    Patient ID: Emily Sweeney, female    DOB: 09-09-1940, 80 y.o.   MRN: [id number]  HPI The patient is here for follow up from the hospital.  Her daughter is with her.   Admitted 7/31 - 8/3  Cc: dizziness, fall, right sided CP, poor po intake   She was getting into bed, lost her footing and fell.  She hit her right forehead.  No LOC.  She tried to get up and walk and fell again.  No injuries.  She did not eat or drink x 4 days due to no appetite.  No GI symptoms.  She was taking her medications.  She had right sided chest pain/tightness intermittently - lasted 30 min, non-radiating.  Resolved with rest.  Not related to exertion.  ACS ruled out.  She was found to have hyponatremia (Na 124).     Hyponatremia, dizziness - multifactorial - thiazide diuretic, dietary imbalance/low relative solute intake Relative adrenal insufficiency considered but cortisol normal Tsh normal Hctz held - Na improved, urinary dilution improved Urea given 8/2 to accelerate improvement Needs Na follow up  Hypotension in setting of chronic htn: Improved Normotensive on d/c Hctz stopped Continued on ramipril and metoprolol  Hypokalemia - Supplemented and resolved  HIchol -  Continued on statin  GERD -  Continue PPI  Diet controlled DM 2: A1c 6.9%  Sciatica: Continue out pt therapy Symptoms controlled now In future - recommend lowest dose steroid as possible w/ prolong tapering Gabapnetin stopped - was not taking for a while  B12 def -  B12 level normal Continue B12 supp  Chest pain : Resolved, atypical Trops neg, EKG wnl - ACS ruled out  Microcytic anemia: Mild, stable Iron levels borderline Discuss oral iron supp vs dietary fortification    BP varies at home 134/? By PT.  Her cuff was accurate  and similar to PT reading.  She will sometimes get SBP in 160's.    She is drinking and eating well.  Sleep is good.  Balance is still not good. Legs are weak.  Has someone  coming in once a week to help out.   Has PT for several weeks.    Wonders about life alert - worried about falls.    Chronic left knee pain - would like to have it evaluated.    Medications and allergies reviewed with patient and updated if appropriate.  Patient Active Problem List   Diagnosis Date Noted   GERD (gastroesophageal reflux disease) 03/21/2021   Dizziness 03/13/2021   Hyponatremia 03/13/2021   Callus 01/31/2021   Hav (hallux abducto valgus), unspecified laterality 01/31/2021   Plantar flexed metatarsal 01/31/2021   PTTD (posterior tibial tendon dysfunction) 01/31/2021   Cellulitis of right lower extremity 10/25/2020   Hematoma 10/25/2020   Acute right lumbar radiculopathy 08/16/2020   Diabetes mellitus without complication ([place]) 99991111   Iron deficiency anemia 07/01/2016   Chronic lumbar radiculopathy 06/17/2016   Poor balance 05/24/2016   At high risk for falls 05/24/2016   Gait abnormality 04/27/2016   Lumbosacral spondylosis without myelopathy 01/19/2016   S/P excision of acoustic neuroma 01/19/2016   Abdominal gas pain 01/19/2016   B12 deficiency 10/17/2015   Pernicious anemia 06/09/2015   PVC (premature ventricular contraction) 02/25/2015   Osteopenia 01/12/2015   Vitamin D deficiency 01/12/2015   Nonspecific abnormal electrocardiogram (ECG) (EKG) 10/16/2014   Left knee pain 12/18/2011   DIVERTICULITIS OF COLON 10/11/2009   Dyslipidemia 10/21/2008   Essential hypertension  10/21/2008    Current Outpatient Medications on File Prior to Visit  Medication Sig Dispense Refill   aspirin EC 81 MG tablet Take 1 tablet (81 mg total) by mouth daily. 90 tablet 1   atorvastatin (LIPITOR) 20 MG tablet TAKE 1 TABLET ONCE DAILY. 90 tablet 3   B Complex Vitamins (VITAMIN B COMPLEX PO) Take 1 tablet by mouth daily.     fluticasone (FLONASE) 50 MCG/ACT nasal spray Place 1 spray into both nostrils as needed for allergies or rhinitis.     meloxicam (MOBIC) 15 MG tablet  Take 1 tablet (15 mg total) by mouth daily as needed for pain.     metoprolol succinate (TOPROL-XL) 25 MG 24 hr tablet TAKE 1 TABLET ONCE DAILY. 90 tablet 3   Multiple Vitamins-Minerals (HAIR SKIN AND NAILS FORMULA) TABS Take 1 tablet by mouth daily.     pantoprazole (PROTONIX) 40 MG tablet Take 1 tablet (40 mg total) by mouth daily. 90 tablet 3   Probiotic Product (PROBIOTIC ADVANCED PO) Take 1 capsule by mouth daily.     Propylene Glycol (SYSTANE BALANCE OP) Place 1 drop into both eyes daily as needed (dry eyes).     ramipril (ALTACE) 10 MG capsule Take 1 capsule (10 mg total) by mouth daily. 90 capsule 3   simethicone (MYLICON) 80 MG chewable tablet Chew 80 mg by mouth every 6 (six) hours as needed for flatulence.     VITAMIN D, CHOLECALCIFEROL, PO Take 5,000 Units by mouth daily.      No current facility-administered medications on file prior to visit.    Past Medical History:  Diagnosis Date   Anemia    Blood transfusion without reported diagnosis    Diverticulosis of colon    GERD (gastroesophageal reflux disease)    Hearing loss    right ear, and tinnitus   Hyperlipidemia    Hypertension    Tubular adenoma 03/05/2015   Polyp, and Benign lymphoid polyp.   UTI (urinary tract infection)     Past Surgical History:  Procedure Laterality Date   APPENDECTOMY     BREAST BIOPSY     benign   CATARACT EXTRACTION, BILATERAL  2019, 2020   COCHLEAR IMPLANT     CRANIECTOMY FOR EXCISION OF ACOUSTIC NEUROMA  2009   NASAL SEPTUM SURGERY     TONSILLECTOMY     UPPER GASTROINTESTINAL ENDOSCOPY  06/02/2020    Social History   Socioeconomic History   Marital status: Divorced    Spouse name: Not on file   Number of children: Not on file   Years of education: Not on file   Highest education level: Not on file  Occupational History   Occupation: psychologist  Tobacco Use   Smoking status: Former    Types: Cigarettes    Quit date: 07/04/1991    Years since quitting: 29.7    Smokeless tobacco: Never  Vaping Use   Vaping Use: Never used  Substance and Sexual Activity   Alcohol use: Yes    Alcohol/week: 0.0 standard drinks    Comment: glass wine 4-5 days a week   Drug use: No   Sexual activity: Not Currently  Other Topics Concern   Not on file  Social History Narrative   Not on file   Social Determinants of Health   Financial Resource Strain: Low Risk    Difficulty of Paying Living Expenses: Not hard at all  Food Insecurity: No Food Insecurity   Worried About Charity fundraiser in  the Last Year: Never true   Ran Out of Food in the Last Year: Never true  Transportation Needs: No Transportation Needs   Lack of Transportation (Medical): No   Lack of Transportation (Non-Medical): No  Physical Activity: Sufficiently Active   Days of Exercise per Week: 5 days   Minutes of Exercise per Session: 30 min  Stress: No Stress Concern Present   Feeling of Stress : Not at all  Social Connections: Socially Isolated   Frequency of Communication with Friends and Family: More than three times a week   Frequency of Social Gatherings with Friends and Family: Three times a week   Attends Religious Services: Never   Active Member of Clubs or Organizations: No   Attends Archivist Meetings: Never   Marital Status: Divorced    Family History  Problem Relation Age of Onset   Sudden death Father 34       MI   Coronary artery disease Father    Heart disease Father    Dementia Mother 26   Alcohol abuse Brother    Colon cancer Neg Hx    Esophageal cancer Neg Hx     Review of Systems  Constitutional:  Negative for appetite change (eating and drinking well) and fever.  Respiratory:  Positive for cough (occ - Gerd related). Negative for shortness of breath and wheezing.   Cardiovascular:  Positive for leg swelling (intermittent). Negative for chest pain and palpitations.  Gastrointestinal:  Positive for blood in stool (occ - hemorrhoid,  no melena).  Negative for abdominal pain, constipation, diarrhea and nausea.  Musculoskeletal:  Positive for arthralgias.  Neurological:  Positive for light-headedness. Negative for dizziness and headaches.       Balance is off  Psychiatric/Behavioral:  Negative for sleep disturbance.       Objective:   Vitals:   03/22/21 1124  BP: 136/84  Pulse: 81  Temp: 98.4 F (36.9 C)  SpO2: 99%   BP Readings from Last 3 Encounters:  03/22/21 136/84  03/16/21 128/62  10/25/20 140/78   Wt Readings from Last 3 Encounters:  03/22/21 144 lb 9.6 oz (65.6 kg)  03/13/21 142 lb 12.8 oz (64.8 kg)  10/25/20 149 lb 9.6 oz (67.9 kg)   Body mass index is 22.31 kg/m.   Physical Exam    Constitutional: Appears well-developed and well-nourished. No distress.  HENT:  Head: Normocephalic and atraumatic.  Neck: Neck supple. No tracheal deviation present. No thyromegaly present.  No cervical lymphadenopathy Cardiovascular: Normal rate, regular rhythm and normal heart sounds.   No murmur heard. No carotid bruit .  No edema Pulmonary/Chest: Effort normal and breath sounds normal. No respiratory distress. No has no wheezes. No rales.  Abdomen:  soft, NT, ND Skin: Skin is warm and dry. Not diaphoretic.  Psychiatric: Normal mood and affect. Behavior is normal.    Lab Results  Component Value Date   WBC 5.9 03/15/2021   HGB 10.9 (L) 03/15/2021   HCT 33.2 (L) 03/15/2021   PLT 257 03/15/2021   GLUCOSE 92 03/22/2021   CHOL 178 12/20/2020   TRIG 66.0 12/20/2020   HDL 84.50 12/20/2020   LDLCALC 80 12/20/2020   ALT 10 12/20/2020   AST 17 12/20/2020   NA 134 (L) 03/22/2021   K 4.2 03/22/2021   CL 99 03/22/2021   CREATININE 0.88 03/22/2021   BUN 9 03/22/2021   CO2 25 03/22/2021   TSH 1.566 03/13/2021   INR 1.0 06/09/2015   HGBA1C 6.9 (H)  12/20/2020    CT Head Wo Contrast CLINICAL DATA:  Dizziness, weakness.  Stroke suspected  EXAM: CT HEAD WITHOUT CONTRAST  TECHNIQUE: Contiguous axial images were  obtained from the base of the skull through the vertex without intravenous contrast.  COMPARISON:  06/13/2018  FINDINGS: Brain: There is atrophy and chronic small vessel disease changes. No acute intracranial abnormality. Specifically, no hemorrhage, hydrocephalus, mass lesion, acute infarction, or significant intracranial injury.  Vascular: No hyperdense vessel or unexpected calcification.  Skull: No acute calvarial abnormality.  Sinuses/Orbits: No acute findings  Other: None  IMPRESSION: Atrophy, chronic microvascular disease.  No acute intracranial abnormality.  Electronically Signed   By: Rolm Baptise M.D.   On: 03/13/2021 15:20 DG Chest 2 View CLINICAL DATA:  Chest pressure.  Shortness of breath.  EXAM: CHEST - 2 VIEW  COMPARISON:  Chest radiograph 06/13/2018.  FINDINGS: Stable cardiac and mediastinal contours. Bibasilar airspace opacities. No pleural effusion or pneumothorax. Thoracic spine degenerative changes.  IMPRESSION: Bibasilar heterogeneous opacities favored to represent atelectasis.  Electronically Signed   By: Lovey Newcomer M.D.   On: 03/13/2021 12:17    Assessment & Plan:    See Problem List for Assessment and Plan of chronic medical problems.    This visit occurred during the SARS-CoV-2 public health emergency.  Safety protocols were in place, including screening questions prior to the visit, additional usage of staff PPE, and extensive cleaning of exam room while observing appropriate contact time as indicated for disinfecting solutions.

## 2021-03-22 ENCOUNTER — Ambulatory Visit (INDEPENDENT_AMBULATORY_CARE_PROVIDER_SITE_OTHER): Payer: Medicare Other | Admitting: Internal Medicine

## 2021-03-22 ENCOUNTER — Other Ambulatory Visit: Payer: Self-pay

## 2021-03-22 ENCOUNTER — Encounter: Payer: Self-pay | Admitting: Internal Medicine

## 2021-03-22 VITALS — BP 136/84 | HR 81 | Temp 98.4°F | Ht 67.5 in | Wt 144.6 lb

## 2021-03-22 DIAGNOSIS — G8929 Other chronic pain: Secondary | ICD-10-CM | POA: Diagnosis not present

## 2021-03-22 DIAGNOSIS — D509 Iron deficiency anemia, unspecified: Secondary | ICD-10-CM

## 2021-03-22 DIAGNOSIS — K219 Gastro-esophageal reflux disease without esophagitis: Secondary | ICD-10-CM | POA: Diagnosis not present

## 2021-03-22 DIAGNOSIS — Z9181 History of falling: Secondary | ICD-10-CM

## 2021-03-22 DIAGNOSIS — I10 Essential (primary) hypertension: Secondary | ICD-10-CM | POA: Diagnosis not present

## 2021-03-22 DIAGNOSIS — E871 Hypo-osmolality and hyponatremia: Secondary | ICD-10-CM

## 2021-03-22 DIAGNOSIS — E119 Type 2 diabetes mellitus without complications: Secondary | ICD-10-CM

## 2021-03-22 DIAGNOSIS — E538 Deficiency of other specified B group vitamins: Secondary | ICD-10-CM

## 2021-03-22 DIAGNOSIS — R42 Dizziness and giddiness: Secondary | ICD-10-CM

## 2021-03-22 DIAGNOSIS — M5416 Radiculopathy, lumbar region: Secondary | ICD-10-CM

## 2021-03-22 DIAGNOSIS — E785 Hyperlipidemia, unspecified: Secondary | ICD-10-CM | POA: Diagnosis not present

## 2021-03-22 DIAGNOSIS — M25562 Pain in left knee: Secondary | ICD-10-CM | POA: Diagnosis not present

## 2021-03-22 LAB — BASIC METABOLIC PANEL
BUN: 9 mg/dL (ref 6–23)
CO2: 25 mEq/L (ref 19–32)
Calcium: 9.6 mg/dL (ref 8.4–10.5)
Chloride: 99 mEq/L (ref 96–112)
Creatinine, Ser: 0.88 mg/dL (ref 0.40–1.20)
GFR: 61.4 mL/min (ref >60.00)
Glucose, Bld: 92 mg/dL (ref 70–99)
Potassium: 4.2 mEq/L (ref 3.5–5.1)
Sodium: 134 mEq/L — ABNORMAL LOW (ref 135–145)

## 2021-03-22 NOTE — Assessment & Plan Note (Signed)
Acute Recently hospitalized for symptomatic hyponatremia, which is likely related to HCTZ, dehydration/poor p.o. intake  Resolved in the hospital IV fluids, urea and discontinuing hctz Not currently having any symptoms suggestive of hyponatremia BMP today

## 2021-03-22 NOTE — Assessment & Plan Note (Signed)
Chronic Continue B12 supplementation 

## 2021-03-22 NOTE — Assessment & Plan Note (Signed)
Chronic She has a history of falls and before she was hospitalized fell twice.  Likely there is no major injuries She has weakness in her legs and neuropathy, which is increasing her risk of falls She is working with PT and will continue that as long as possible and then continue the exercises at home Discussed life alert and other options to help alert family 911 if she falls She does have a walker at home and can use that if needed She currently has someone coming in once a week to help her and may increase that to twice a week Has chronic knee arthritis-referred to orthopedics for further evaluation, since this is likely increasing her risk of falls

## 2021-03-22 NOTE — Assessment & Plan Note (Signed)
Chronic Continue atorvastatin 20 mg daily 

## 2021-03-22 NOTE — Assessment & Plan Note (Signed)
Denies any dizziness Does state occasional lightheadedness that occurs intermittently and not necessarily orthostatic

## 2021-03-22 NOTE — Assessment & Plan Note (Signed)
Chronic Secondary to osteoarthritis This is causing more discomfort and weakness Knee is swollen, which is likely related to her osteoarthritis She would like to see orthopedics for further evaluation-referral ordered for Dr. Mayer Camel per her request Currently doing physical therapy to help strengthen leg muscles

## 2021-03-22 NOTE — Assessment & Plan Note (Signed)
Chronic GERD controlled Continue pantoprazole 40 mg daily She does have some increased difficulty swallowing and indigestion along with some discomfort in her chest that is likely gas related or related to her hiatal hernia Continue simethicone 80 mg every 6 hours as needed She will follow-up with Dr. Lynnell Dike she needs a referral she will let me know

## 2021-03-22 NOTE — Assessment & Plan Note (Signed)
Chronic Lab Results  Component Value Date   HGBA1C 6.9 (H) 12/20/2020   Well controlled with diet Continue diabetic diet

## 2021-03-22 NOTE — Assessment & Plan Note (Signed)
Chronic BP well controlled here today, but is variable at home - she will update me with some of her readings from home Given good blood pressure here today Will continue metoprolol XL 25 mg daily and ramipril 10 mg daily Discussed with her and her daughter that we may need to have some leniency with the variability of her BP because we do not want to overtreat her and increase her risk of falls Her BP cuff at home is accurate and she will continue to monitor and update me

## 2021-03-22 NOTE — Assessment & Plan Note (Signed)
Chronic Will recheck iron levels and blood counts at her next visit next month She does have a history of nonbleeding erosions in her stomach and has probably hiatal hernia, which will also increase her risk of bleeding Will follow-up with GI

## 2021-03-23 DIAGNOSIS — M47817 Spondylosis without myelopathy or radiculopathy, lumbosacral region: Secondary | ICD-10-CM | POA: Diagnosis not present

## 2021-03-23 DIAGNOSIS — E871 Hypo-osmolality and hyponatremia: Secondary | ICD-10-CM | POA: Diagnosis not present

## 2021-03-23 DIAGNOSIS — E1140 Type 2 diabetes mellitus with diabetic neuropathy, unspecified: Secondary | ICD-10-CM | POA: Diagnosis not present

## 2021-03-23 DIAGNOSIS — M4726 Other spondylosis with radiculopathy, lumbar region: Secondary | ICD-10-CM | POA: Diagnosis not present

## 2021-03-23 DIAGNOSIS — I10 Essential (primary) hypertension: Secondary | ICD-10-CM | POA: Diagnosis not present

## 2021-03-23 DIAGNOSIS — I959 Hypotension, unspecified: Secondary | ICD-10-CM | POA: Diagnosis not present

## 2021-03-28 ENCOUNTER — Encounter: Payer: Self-pay | Admitting: Internal Medicine

## 2021-03-28 ENCOUNTER — Telehealth: Payer: Self-pay | Admitting: Internal Medicine

## 2021-03-28 DIAGNOSIS — I959 Hypotension, unspecified: Secondary | ICD-10-CM | POA: Diagnosis not present

## 2021-03-28 DIAGNOSIS — I10 Essential (primary) hypertension: Secondary | ICD-10-CM | POA: Diagnosis not present

## 2021-03-28 DIAGNOSIS — E871 Hypo-osmolality and hyponatremia: Secondary | ICD-10-CM | POA: Diagnosis not present

## 2021-03-28 DIAGNOSIS — M4726 Other spondylosis with radiculopathy, lumbar region: Secondary | ICD-10-CM | POA: Diagnosis not present

## 2021-03-28 DIAGNOSIS — E1140 Type 2 diabetes mellitus with diabetic neuropathy, unspecified: Secondary | ICD-10-CM | POA: Diagnosis not present

## 2021-03-28 DIAGNOSIS — M47817 Spondylosis without myelopathy or radiculopathy, lumbosacral region: Secondary | ICD-10-CM | POA: Diagnosis not present

## 2021-03-28 NOTE — Telephone Encounter (Signed)
Rob Phone: [place]  Pt k meds at 6am per usual BP 3 times, systolic between [id number] without activity  Abnormal for this patient  Patient will send a my chart to Dr. Quay Burow with the readings from yesterday that were also off

## 2021-03-29 MED ORDER — METOPROLOL SUCCINATE ER 25 MG PO TB24: 37.5000 mg | ORAL_TABLET | Freq: Every day | ORAL | 3 refills | Status: DC

## 2021-03-30 DIAGNOSIS — I10 Essential (primary) hypertension: Secondary | ICD-10-CM | POA: Diagnosis not present

## 2021-03-30 DIAGNOSIS — M4726 Other spondylosis with radiculopathy, lumbar region: Secondary | ICD-10-CM | POA: Diagnosis not present

## 2021-03-30 DIAGNOSIS — M47817 Spondylosis without myelopathy or radiculopathy, lumbosacral region: Secondary | ICD-10-CM | POA: Diagnosis not present

## 2021-03-30 DIAGNOSIS — E1140 Type 2 diabetes mellitus with diabetic neuropathy, unspecified: Secondary | ICD-10-CM | POA: Diagnosis not present

## 2021-03-30 DIAGNOSIS — E871 Hypo-osmolality and hyponatremia: Secondary | ICD-10-CM | POA: Diagnosis not present

## 2021-03-30 DIAGNOSIS — I959 Hypotension, unspecified: Secondary | ICD-10-CM | POA: Diagnosis not present

## 2021-04-01 DIAGNOSIS — M1712 Unilateral primary osteoarthritis, left knee: Secondary | ICD-10-CM | POA: Diagnosis not present

## 2021-04-04 DIAGNOSIS — E1140 Type 2 diabetes mellitus with diabetic neuropathy, unspecified: Secondary | ICD-10-CM | POA: Diagnosis not present

## 2021-04-04 DIAGNOSIS — I959 Hypotension, unspecified: Secondary | ICD-10-CM | POA: Diagnosis not present

## 2021-04-04 DIAGNOSIS — M4726 Other spondylosis with radiculopathy, lumbar region: Secondary | ICD-10-CM | POA: Diagnosis not present

## 2021-04-04 DIAGNOSIS — E871 Hypo-osmolality and hyponatremia: Secondary | ICD-10-CM | POA: Diagnosis not present

## 2021-04-04 DIAGNOSIS — I10 Essential (primary) hypertension: Secondary | ICD-10-CM | POA: Diagnosis not present

## 2021-04-04 DIAGNOSIS — M47817 Spondylosis without myelopathy or radiculopathy, lumbosacral region: Secondary | ICD-10-CM | POA: Diagnosis not present

## 2021-04-05 ENCOUNTER — Encounter: Payer: Self-pay | Admitting: Internal Medicine

## 2021-04-06 DIAGNOSIS — M47817 Spondylosis without myelopathy or radiculopathy, lumbosacral region: Secondary | ICD-10-CM | POA: Diagnosis not present

## 2021-04-06 DIAGNOSIS — M4726 Other spondylosis with radiculopathy, lumbar region: Secondary | ICD-10-CM | POA: Diagnosis not present

## 2021-04-06 DIAGNOSIS — I10 Essential (primary) hypertension: Secondary | ICD-10-CM | POA: Diagnosis not present

## 2021-04-06 DIAGNOSIS — E871 Hypo-osmolality and hyponatremia: Secondary | ICD-10-CM | POA: Diagnosis not present

## 2021-04-06 DIAGNOSIS — E1140 Type 2 diabetes mellitus with diabetic neuropathy, unspecified: Secondary | ICD-10-CM | POA: Diagnosis not present

## 2021-04-06 DIAGNOSIS — I959 Hypotension, unspecified: Secondary | ICD-10-CM | POA: Diagnosis not present

## 2021-04-07 MED ORDER — SPIRONOLACTONE 25 MG PO TABS: 12.5000 mg | ORAL_TABLET | Freq: Every day | ORAL | 1 refills | Status: DC

## 2021-04-07 NOTE — Addendum Note (Signed)
Addended by: Binnie Rail on: 04/07/2021 04:53 PM   Modules accepted: Orders

## 2021-04-08 ENCOUNTER — Telehealth: Payer: Self-pay | Admitting: Internal Medicine

## 2021-04-08 NOTE — Telephone Encounter (Signed)
Waiting for Dr. Quay Burow to sign

## 2021-04-08 NOTE — Telephone Encounter (Signed)
Patient has dropped off handicap place card form. Form has been place in providers box. Patient stated if this could be completed she can pick it up during her appointment or you may call when it is ready for pickup

## 2021-04-11 DIAGNOSIS — E871 Hypo-osmolality and hyponatremia: Secondary | ICD-10-CM | POA: Diagnosis not present

## 2021-04-11 DIAGNOSIS — E1140 Type 2 diabetes mellitus with diabetic neuropathy, unspecified: Secondary | ICD-10-CM | POA: Diagnosis not present

## 2021-04-11 DIAGNOSIS — I959 Hypotension, unspecified: Secondary | ICD-10-CM | POA: Diagnosis not present

## 2021-04-11 DIAGNOSIS — M4726 Other spondylosis with radiculopathy, lumbar region: Secondary | ICD-10-CM | POA: Diagnosis not present

## 2021-04-11 DIAGNOSIS — M47817 Spondylosis without myelopathy or radiculopathy, lumbosacral region: Secondary | ICD-10-CM | POA: Diagnosis not present

## 2021-04-11 DIAGNOSIS — I10 Essential (primary) hypertension: Secondary | ICD-10-CM | POA: Diagnosis not present

## 2021-04-11 NOTE — Telephone Encounter (Signed)
Copy made. Spoke with patient and mailed out today.

## 2021-04-12 DIAGNOSIS — E785 Hyperlipidemia, unspecified: Secondary | ICD-10-CM

## 2021-04-12 DIAGNOSIS — G319 Degenerative disease of nervous system, unspecified: Secondary | ICD-10-CM | POA: Diagnosis not present

## 2021-04-12 DIAGNOSIS — Z79899 Other long term (current) drug therapy: Secondary | ICD-10-CM

## 2021-04-12 DIAGNOSIS — E538 Deficiency of other specified B group vitamins: Secondary | ICD-10-CM | POA: Diagnosis not present

## 2021-04-12 DIAGNOSIS — K449 Diaphragmatic hernia without obstruction or gangrene: Secondary | ICD-10-CM | POA: Diagnosis not present

## 2021-04-12 DIAGNOSIS — M47814 Spondylosis without myelopathy or radiculopathy, thoracic region: Secondary | ICD-10-CM | POA: Diagnosis not present

## 2021-04-12 DIAGNOSIS — Z791 Long term (current) use of non-steroidal anti-inflammatories (NSAID): Secondary | ICD-10-CM

## 2021-04-12 DIAGNOSIS — M47818 Spondylosis without myelopathy or radiculopathy, sacral and sacrococcygeal region: Secondary | ICD-10-CM | POA: Diagnosis not present

## 2021-04-12 DIAGNOSIS — Z9181 History of falling: Secondary | ICD-10-CM

## 2021-04-12 DIAGNOSIS — H9191 Unspecified hearing loss, right ear: Secondary | ICD-10-CM

## 2021-04-12 DIAGNOSIS — M4726 Other spondylosis with radiculopathy, lumbar region: Secondary | ICD-10-CM | POA: Diagnosis not present

## 2021-04-12 DIAGNOSIS — Z87440 Personal history of urinary (tract) infections: Secondary | ICD-10-CM

## 2021-04-12 DIAGNOSIS — K219 Gastro-esophageal reflux disease without esophagitis: Secondary | ICD-10-CM

## 2021-04-12 DIAGNOSIS — Z87891 Personal history of nicotine dependence: Secondary | ICD-10-CM

## 2021-04-12 DIAGNOSIS — I959 Hypotension, unspecified: Secondary | ICD-10-CM | POA: Diagnosis not present

## 2021-04-12 DIAGNOSIS — M47817 Spondylosis without myelopathy or radiculopathy, lumbosacral region: Secondary | ICD-10-CM | POA: Diagnosis not present

## 2021-04-12 DIAGNOSIS — I10 Essential (primary) hypertension: Secondary | ICD-10-CM | POA: Diagnosis not present

## 2021-04-12 DIAGNOSIS — E871 Hypo-osmolality and hyponatremia: Secondary | ICD-10-CM | POA: Diagnosis not present

## 2021-04-12 DIAGNOSIS — K5730 Diverticulosis of large intestine without perforation or abscess without bleeding: Secondary | ICD-10-CM

## 2021-04-12 DIAGNOSIS — D509 Iron deficiency anemia, unspecified: Secondary | ICD-10-CM | POA: Diagnosis not present

## 2021-04-12 DIAGNOSIS — E1140 Type 2 diabetes mellitus with diabetic neuropathy, unspecified: Secondary | ICD-10-CM | POA: Diagnosis not present

## 2021-04-12 DIAGNOSIS — Z7982 Long term (current) use of aspirin: Secondary | ICD-10-CM

## 2021-04-13 ENCOUNTER — Telehealth: Payer: Self-pay | Admitting: Podiatry

## 2021-04-13 DIAGNOSIS — M4726 Other spondylosis with radiculopathy, lumbar region: Secondary | ICD-10-CM | POA: Diagnosis not present

## 2021-04-13 DIAGNOSIS — I959 Hypotension, unspecified: Secondary | ICD-10-CM | POA: Diagnosis not present

## 2021-04-13 DIAGNOSIS — I10 Essential (primary) hypertension: Secondary | ICD-10-CM | POA: Diagnosis not present

## 2021-04-13 DIAGNOSIS — E1140 Type 2 diabetes mellitus with diabetic neuropathy, unspecified: Secondary | ICD-10-CM | POA: Diagnosis not present

## 2021-04-13 DIAGNOSIS — M47817 Spondylosis without myelopathy or radiculopathy, lumbosacral region: Secondary | ICD-10-CM | POA: Diagnosis not present

## 2021-04-13 DIAGNOSIS — E871 Hypo-osmolality and hyponatremia: Secondary | ICD-10-CM | POA: Diagnosis not present

## 2021-04-13 NOTE — Telephone Encounter (Signed)
Pt called checking status of diabetic inserts ordered and upon checking they were needing the width of the inserts they need to make. Pt stated she is a medium width. I apologized but was never contacted by the company with requesting more information but have now sent a message to the company and the rep that I deal with on a regular basis.

## 2021-04-17 DIAGNOSIS — Z7982 Long term (current) use of aspirin: Secondary | ICD-10-CM | POA: Diagnosis not present

## 2021-04-17 DIAGNOSIS — K449 Diaphragmatic hernia without obstruction or gangrene: Secondary | ICD-10-CM | POA: Diagnosis not present

## 2021-04-17 DIAGNOSIS — Z79899 Other long term (current) drug therapy: Secondary | ICD-10-CM | POA: Diagnosis not present

## 2021-04-17 DIAGNOSIS — Z87891 Personal history of nicotine dependence: Secondary | ICD-10-CM | POA: Diagnosis not present

## 2021-04-17 DIAGNOSIS — E785 Hyperlipidemia, unspecified: Secondary | ICD-10-CM | POA: Diagnosis not present

## 2021-04-17 DIAGNOSIS — H9191 Unspecified hearing loss, right ear: Secondary | ICD-10-CM | POA: Diagnosis not present

## 2021-04-17 DIAGNOSIS — M47817 Spondylosis without myelopathy or radiculopathy, lumbosacral region: Secondary | ICD-10-CM | POA: Diagnosis not present

## 2021-04-17 DIAGNOSIS — I959 Hypotension, unspecified: Secondary | ICD-10-CM | POA: Diagnosis not present

## 2021-04-17 DIAGNOSIS — Z791 Long term (current) use of non-steroidal anti-inflammatories (NSAID): Secondary | ICD-10-CM | POA: Diagnosis not present

## 2021-04-17 DIAGNOSIS — M47814 Spondylosis without myelopathy or radiculopathy, thoracic region: Secondary | ICD-10-CM | POA: Diagnosis not present

## 2021-04-17 DIAGNOSIS — K219 Gastro-esophageal reflux disease without esophagitis: Secondary | ICD-10-CM | POA: Diagnosis not present

## 2021-04-17 DIAGNOSIS — E1140 Type 2 diabetes mellitus with diabetic neuropathy, unspecified: Secondary | ICD-10-CM | POA: Diagnosis not present

## 2021-04-17 DIAGNOSIS — Z87440 Personal history of urinary (tract) infections: Secondary | ICD-10-CM | POA: Diagnosis not present

## 2021-04-17 DIAGNOSIS — I10 Essential (primary) hypertension: Secondary | ICD-10-CM | POA: Diagnosis not present

## 2021-04-17 DIAGNOSIS — K5730 Diverticulosis of large intestine without perforation or abscess without bleeding: Secondary | ICD-10-CM | POA: Diagnosis not present

## 2021-04-17 DIAGNOSIS — E871 Hypo-osmolality and hyponatremia: Secondary | ICD-10-CM | POA: Diagnosis not present

## 2021-04-17 DIAGNOSIS — G319 Degenerative disease of nervous system, unspecified: Secondary | ICD-10-CM | POA: Diagnosis not present

## 2021-04-17 DIAGNOSIS — Z9181 History of falling: Secondary | ICD-10-CM | POA: Diagnosis not present

## 2021-04-17 DIAGNOSIS — D509 Iron deficiency anemia, unspecified: Secondary | ICD-10-CM | POA: Diagnosis not present

## 2021-04-17 DIAGNOSIS — E538 Deficiency of other specified B group vitamins: Secondary | ICD-10-CM | POA: Diagnosis not present

## 2021-04-17 DIAGNOSIS — M47818 Spondylosis without myelopathy or radiculopathy, sacral and sacrococcygeal region: Secondary | ICD-10-CM | POA: Diagnosis not present

## 2021-04-17 DIAGNOSIS — M4726 Other spondylosis with radiculopathy, lumbar region: Secondary | ICD-10-CM | POA: Diagnosis not present

## 2021-04-19 DIAGNOSIS — M47817 Spondylosis without myelopathy or radiculopathy, lumbosacral region: Secondary | ICD-10-CM | POA: Diagnosis not present

## 2021-04-19 DIAGNOSIS — E871 Hypo-osmolality and hyponatremia: Secondary | ICD-10-CM | POA: Diagnosis not present

## 2021-04-19 DIAGNOSIS — I959 Hypotension, unspecified: Secondary | ICD-10-CM | POA: Diagnosis not present

## 2021-04-19 DIAGNOSIS — M4726 Other spondylosis with radiculopathy, lumbar region: Secondary | ICD-10-CM | POA: Diagnosis not present

## 2021-04-19 DIAGNOSIS — I10 Essential (primary) hypertension: Secondary | ICD-10-CM | POA: Diagnosis not present

## 2021-04-19 DIAGNOSIS — E1140 Type 2 diabetes mellitus with diabetic neuropathy, unspecified: Secondary | ICD-10-CM | POA: Diagnosis not present

## 2021-04-26 NOTE — Progress Notes (Signed)
Subjective:    Patient ID: Emily Sweeney, female    DOB: March 29, 1941, 80 y.o.   MRN: [id number]  This visit occurred during the SARS-CoV-2 public health emergency.  Safety protocols were in place, including screening questions prior to the visit, additional usage of staff PPE, and extensive cleaning of exam room while observing appropriate contact time as indicated for disinfecting solutions.     HPI The patient is here for follow up of their chronic medical problems, including diet controlled DM, hichol, htn, knee OA, h/o falls  Her appetite is better.  BP is controlled at home.  Still doing PT.   No falls.  She saw ortho PA for her left knee - she had a cortisone injection.    Medications and allergies reviewed with patient and updated if appropriate.  Patient Active Problem List   Diagnosis Date Noted   GERD (gastroesophageal reflux disease) 03/21/2021   Dizziness 03/13/2021   Hyponatremia 03/13/2021   Callus 01/31/2021   Hav (hallux abducto valgus), unspecified laterality 01/31/2021   Plantar flexed metatarsal 01/31/2021   PTTD (posterior tibial tendon dysfunction) 01/31/2021   Cellulitis of right lower extremity 10/25/2020   Hematoma 10/25/2020   Acute right lumbar radiculopathy 08/16/2020   Diabetes mellitus without complication ([place]) 99991111   Iron deficiency anemia 07/01/2016   Chronic lumbar radiculopathy 06/17/2016   Poor balance 05/24/2016   At high risk for falls 05/24/2016   Gait abnormality 04/27/2016   Lumbosacral spondylosis without myelopathy 01/19/2016   S/P excision of acoustic neuroma 01/19/2016   Abdominal gas pain 01/19/2016   B12 deficiency 10/17/2015   Pernicious anemia 06/09/2015   PVC (premature ventricular contraction) 02/25/2015   Osteopenia 01/12/2015   Vitamin D deficiency 01/12/2015   Nonspecific abnormal electrocardiogram (ECG) (EKG) 10/16/2014   Left knee pain 12/18/2011   DIVERTICULITIS OF COLON 10/11/2009   Dyslipidemia  10/21/2008   Essential hypertension 10/21/2008    Current Outpatient Medications on File Prior to Visit  Medication Sig Dispense Refill   aspirin EC 81 MG tablet Take 1 tablet (81 mg total) by mouth daily. 90 tablet 1   atorvastatin (LIPITOR) 20 MG tablet TAKE 1 TABLET ONCE DAILY. 90 tablet 3   B Complex Vitamins (VITAMIN B COMPLEX PO) Take 1 tablet by mouth daily.     fluticasone (FLONASE) 50 MCG/ACT nasal spray Place 1 spray into both nostrils as needed for allergies or rhinitis.     meloxicam (MOBIC) 15 MG tablet Take 1 tablet (15 mg total) by mouth daily as needed for pain.     metoprolol succinate (TOPROL-XL) 25 MG 24 hr tablet Take 1.5 tablets (37.5 mg total) by mouth daily. 135 tablet 3   Multiple Vitamins-Minerals (HAIR SKIN AND NAILS FORMULA) TABS Take 1 tablet by mouth daily.     pantoprazole (PROTONIX) 40 MG tablet Take 1 tablet (40 mg total) by mouth daily. 90 tablet 3   Probiotic Product (PROBIOTIC ADVANCED PO) Take 1 capsule by mouth daily.     Propylene Glycol (SYSTANE BALANCE OP) Place 1 drop into both eyes daily as needed (dry eyes).     ramipril (ALTACE) 10 MG capsule Take 1 capsule (10 mg total) by mouth daily. 90 capsule 3   simethicone (MYLICON) 80 MG chewable tablet Chew 80 mg by mouth every 6 (six) hours as needed for flatulence.     spironolactone (ALDACTONE) 25 MG tablet Take 0.5 tablets (12.5 mg total) by mouth daily. 45 tablet 1   VITAMIN D, CHOLECALCIFEROL,  PO Take 5,000 Units by mouth daily.      No current facility-administered medications on file prior to visit.    Past Medical History:  Diagnosis Date   Anemia    Blood transfusion without reported diagnosis    Diverticulosis of colon    GERD (gastroesophageal reflux disease)    Hearing loss    right ear, and tinnitus   Hyperlipidemia    Hypertension    Tubular adenoma 03/05/2015   Polyp, and Benign lymphoid polyp.   UTI (urinary tract infection)     Past Surgical History:  Procedure Laterality  Date   APPENDECTOMY     BREAST BIOPSY     benign   CATARACT EXTRACTION, BILATERAL  2019, 2020   COCHLEAR IMPLANT     CRANIECTOMY FOR EXCISION OF ACOUSTIC NEUROMA  2009   NASAL SEPTUM SURGERY     TONSILLECTOMY     UPPER GASTROINTESTINAL ENDOSCOPY  06/02/2020    Social History   Socioeconomic History   Marital status: Divorced    Spouse name: Not on file   Number of children: Not on file   Years of education: Not on file   Highest education level: Not on file  Occupational History   Occupation: psychologist  Tobacco Use   Smoking status: Former    Types: Cigarettes    Quit date: 07/04/1991    Years since quitting: 29.8   Smokeless tobacco: Never  Vaping Use   Vaping Use: Never used  Substance and Sexual Activity   Alcohol use: Yes    Alcohol/week: 0.0 standard drinks    Comment: glass wine 4-5 days a week   Drug use: No   Sexual activity: Not Currently  Other Topics Concern   Not on file  Social History Narrative   Not on file   Social Determinants of Health   Financial Resource Strain: Low Risk    Difficulty of Paying Living Expenses: Not hard at all  Food Insecurity: No Food Insecurity   Worried About Charity fundraiser in the Last Year: Never true   [place] in the Last Year: Never true  Transportation Needs: No Transportation Needs   Lack of Transportation (Medical): No   Lack of Transportation (Non-Medical): No  Physical Activity: Sufficiently Active   Days of Exercise per Week: 5 days   Minutes of Exercise per Session: 30 min  Stress: No Stress Concern Present   Feeling of Stress : Not at all  Social Connections: Socially Isolated   Frequency of Communication with Friends and Family: More than three times a week   Frequency of Social Gatherings with Friends and Family: Three times a week   Attends Religious Services: Never   Active Member of Clubs or Organizations: No   Attends Archivist Meetings: Never   Marital Status: Divorced     Family History  Problem Relation Age of Onset   Sudden death Father 64       MI   Coronary artery disease Father    Heart disease Father    Dementia Mother 43   Alcohol abuse Brother    Colon cancer Neg Hx    Esophageal cancer Neg Hx     Review of Systems  Constitutional:  Negative for chills and fever.  Respiratory:  Positive for cough (occ). Negative for shortness of breath and wheezing.   Cardiovascular:  Negative for chest pain, palpitations and leg swelling.  Gastrointestinal:  Positive for abdominal pain (occ) and anal  bleeding (hemorrhoidal). Negative for blood in stool (no black stool), constipation, diarrhea and nausea.       No gerd  Neurological:  Negative for dizziness, light-headedness and headaches.      Objective:   Vitals:   04/27/21 1054  BP: 130/76  Pulse: 95  Temp: 98.5 F (36.9 C)  SpO2: 96%   BP Readings from Last 3 Encounters:  04/27/21 130/76  03/22/21 136/84  03/16/21 128/62   Wt Readings from Last 3 Encounters:  04/27/21 141 lb (64 kg)  03/22/21 144 lb 9.6 oz (65.6 kg)  03/13/21 142 lb 12.8 oz (64.8 kg)   Body mass index is 21.76 kg/m.   Physical Exam    Constitutional: Appears well-developed and well-nourished. No distress.  HENT:  Head: Normocephalic and atraumatic.  Neck: Neck supple. No tracheal deviation present. No thyromegaly present.  No cervical lymphadenopathy Cardiovascular: Normal rate, regular rhythm and normal heart sounds.   No murmur heard. No carotid bruit .  No edema Pulmonary/Chest: Effort normal and breath sounds normal. No respiratory distress. No has no wheezes. No rales.  Skin: Skin is warm and dry. Not diaphoretic.  Psychiatric: Normal mood and affect. Behavior is normal.      Assessment & Plan:    See Problem List for Assessment and Plan of chronic medical problems.

## 2021-04-26 NOTE — Patient Instructions (Addendum)
   Medications changes include :   none    Please followup in 6 months   

## 2021-04-27 ENCOUNTER — Encounter: Payer: Self-pay | Admitting: Internal Medicine

## 2021-04-27 ENCOUNTER — Other Ambulatory Visit: Payer: Self-pay

## 2021-04-27 ENCOUNTER — Ambulatory Visit (INDEPENDENT_AMBULATORY_CARE_PROVIDER_SITE_OTHER): Payer: Medicare Other | Admitting: Internal Medicine

## 2021-04-27 VITALS — BP 130/76 | HR 95 | Temp 98.5°F | Ht 67.5 in | Wt 141.0 lb

## 2021-04-27 DIAGNOSIS — M25562 Pain in left knee: Secondary | ICD-10-CM | POA: Diagnosis not present

## 2021-04-27 DIAGNOSIS — E119 Type 2 diabetes mellitus without complications: Secondary | ICD-10-CM

## 2021-04-27 DIAGNOSIS — Z23 Encounter for immunization: Secondary | ICD-10-CM | POA: Diagnosis not present

## 2021-04-27 DIAGNOSIS — R141 Gas pain: Secondary | ICD-10-CM

## 2021-04-27 DIAGNOSIS — D509 Iron deficiency anemia, unspecified: Secondary | ICD-10-CM

## 2021-04-27 DIAGNOSIS — E785 Hyperlipidemia, unspecified: Secondary | ICD-10-CM

## 2021-04-27 DIAGNOSIS — G8929 Other chronic pain: Secondary | ICD-10-CM | POA: Diagnosis not present

## 2021-04-27 DIAGNOSIS — I10 Essential (primary) hypertension: Secondary | ICD-10-CM | POA: Diagnosis not present

## 2021-04-27 MED ORDER — RAMIPRIL 10 MG PO CAPS: 10.0000 mg | ORAL_CAPSULE | Freq: Every day | ORAL | 3 refills | Status: DC

## 2021-04-27 NOTE — Assessment & Plan Note (Signed)
Chronic Diet controlled Lab Results  Component Value Date   HGBA1C 6.9 (H) 12/20/2020   Has been controlled-check A1c at next visit

## 2021-04-27 NOTE — Assessment & Plan Note (Signed)
Chronic No symptoms suggestive of active bleeding Has follow-up with GI next week Likely upper GI bleeding from erosions in her stomach, possible hiatal hernia Continue pantoprazole 40 mg daily

## 2021-04-27 NOTE — Assessment & Plan Note (Signed)
Chronic Taking simethicone as needed, which works well-okay to continue

## 2021-04-27 NOTE — Assessment & Plan Note (Signed)
Chronic BP well controlled Continue metoprolol XL 37.5 mg daily, ramipril 10 mg daily, spironolactone 12.5 mg daily

## 2021-04-27 NOTE — Assessment & Plan Note (Signed)
Chronic Related to severe osteoarthritis Has seen orthopedics and had a steroid injection and it did help Has follow-up with orthopedics Currently doing physical therapy

## 2021-04-27 NOTE — Addendum Note (Signed)
Addended by: Marcina Millard on: 04/27/2021 05:04 PM   Modules accepted: Orders

## 2021-04-27 NOTE — Assessment & Plan Note (Signed)
Chronic Continue atorvastatin 20 mg daily Regular exercise and healthy diet encouraged

## 2021-04-29 DIAGNOSIS — E1140 Type 2 diabetes mellitus with diabetic neuropathy, unspecified: Secondary | ICD-10-CM | POA: Diagnosis not present

## 2021-04-29 DIAGNOSIS — I10 Essential (primary) hypertension: Secondary | ICD-10-CM | POA: Diagnosis not present

## 2021-04-29 DIAGNOSIS — M47817 Spondylosis without myelopathy or radiculopathy, lumbosacral region: Secondary | ICD-10-CM | POA: Diagnosis not present

## 2021-04-29 DIAGNOSIS — M4726 Other spondylosis with radiculopathy, lumbar region: Secondary | ICD-10-CM | POA: Diagnosis not present

## 2021-04-29 DIAGNOSIS — I959 Hypotension, unspecified: Secondary | ICD-10-CM | POA: Diagnosis not present

## 2021-04-29 DIAGNOSIS — E871 Hypo-osmolality and hyponatremia: Secondary | ICD-10-CM | POA: Diagnosis not present

## 2021-05-03 ENCOUNTER — Telehealth: Payer: Self-pay | Admitting: Podiatry

## 2021-05-03 DIAGNOSIS — M1712 Unilateral primary osteoarthritis, left knee: Secondary | ICD-10-CM | POA: Diagnosis not present

## 2021-05-03 NOTE — Telephone Encounter (Signed)
Pt left message asking if the inserts she ordered a few months ago have come in as she has not heard anything.  I returned call after checking with the company and they are held up in the lab but I have asked them to ship them asap and pt can hopefully pick them up at her appt on 9.30. with Dr Prudence Davidson.

## 2021-05-04 ENCOUNTER — Ambulatory Visit: Payer: Medicare Other | Admitting: Podiatry

## 2021-05-05 DIAGNOSIS — E1140 Type 2 diabetes mellitus with diabetic neuropathy, unspecified: Secondary | ICD-10-CM | POA: Diagnosis not present

## 2021-05-05 DIAGNOSIS — M4726 Other spondylosis with radiculopathy, lumbar region: Secondary | ICD-10-CM | POA: Diagnosis not present

## 2021-05-05 DIAGNOSIS — I10 Essential (primary) hypertension: Secondary | ICD-10-CM | POA: Diagnosis not present

## 2021-05-05 DIAGNOSIS — M47817 Spondylosis without myelopathy or radiculopathy, lumbosacral region: Secondary | ICD-10-CM | POA: Diagnosis not present

## 2021-05-05 DIAGNOSIS — I959 Hypotension, unspecified: Secondary | ICD-10-CM | POA: Diagnosis not present

## 2021-05-05 DIAGNOSIS — E871 Hypo-osmolality and hyponatremia: Secondary | ICD-10-CM | POA: Diagnosis not present

## 2021-05-09 DIAGNOSIS — I10 Essential (primary) hypertension: Secondary | ICD-10-CM | POA: Diagnosis not present

## 2021-05-09 DIAGNOSIS — M47817 Spondylosis without myelopathy or radiculopathy, lumbosacral region: Secondary | ICD-10-CM | POA: Diagnosis not present

## 2021-05-09 DIAGNOSIS — E1140 Type 2 diabetes mellitus with diabetic neuropathy, unspecified: Secondary | ICD-10-CM | POA: Diagnosis not present

## 2021-05-09 DIAGNOSIS — E871 Hypo-osmolality and hyponatremia: Secondary | ICD-10-CM | POA: Diagnosis not present

## 2021-05-09 DIAGNOSIS — M4726 Other spondylosis with radiculopathy, lumbar region: Secondary | ICD-10-CM | POA: Diagnosis not present

## 2021-05-09 DIAGNOSIS — I959 Hypotension, unspecified: Secondary | ICD-10-CM | POA: Diagnosis not present

## 2021-05-10 ENCOUNTER — Ambulatory Visit (INDEPENDENT_AMBULATORY_CARE_PROVIDER_SITE_OTHER): Payer: Medicare Other | Admitting: Gastroenterology

## 2021-05-10 ENCOUNTER — Encounter: Payer: Self-pay | Admitting: Gastroenterology

## 2021-05-10 VITALS — BP 128/70 | HR 78 | Ht 66.5 in | Wt 141.0 lb

## 2021-05-10 DIAGNOSIS — R140 Abdominal distension (gaseous): Secondary | ICD-10-CM | POA: Diagnosis not present

## 2021-05-10 DIAGNOSIS — R1084 Generalized abdominal pain: Secondary | ICD-10-CM

## 2021-05-10 DIAGNOSIS — K219 Gastro-esophageal reflux disease without esophagitis: Secondary | ICD-10-CM

## 2021-05-10 MED ORDER — DICYCLOMINE HCL 10 MG PO CAPS: 10.0000 mg | ORAL_CAPSULE | Freq: Three times a day (TID) | ORAL | 11 refills | Status: DC

## 2021-05-10 NOTE — Patient Instructions (Signed)
We have sent the following medications to your pharmacy for you to pick up at your convenience: dicyclomine.   You can take over the counter Gas-x four times a day as needed for gas and bloating.   Kegel Exercises Kegel exercises can help strengthen your pelvic floor muscles. The pelvic floor is a group of muscles that support your rectum, small intestine, and bladder. In females, pelvic floor muscles also help support the womb (uterus). These muscles help you control the flow of urine and stool. Kegel exercises are painless and simple, and they do not require any equipment. Your provider may suggest Kegel exercises to: Improve bladder and bowel control. Improve sexual response. Improve weak pelvic floor muscles after surgery to remove the uterus (hysterectomy) or pregnancy (females). Improve weak pelvic floor muscles after prostate gland removal or surgery (males). Kegel exercises involve squeezing your pelvic floor muscles, which are the same muscles you squeeze when you try to stop the flow of urine or keep from passing gas. The exercises can be done while sitting, standing, or lying down, but it is best to vary your position. Exercises How to do Kegel exercises: Squeeze your pelvic floor muscles tight. You should feel a tight lift in your rectal area. If you are a female, you should also feel a tightness in your vaginal area. Keep your stomach, buttocks, and legs relaxed. Hold the muscles tight for up to 10 seconds. Breathe normally. Relax your muscles. Repeat as told by your health care provider. Repeat this exercise daily as told by your health care provider. Continue to do this exercise for at least 4-6 weeks, or for as long as told by your health care provider. You may be referred to a physical therapist who can help you learn more about how to do Kegel exercises. Depending on your condition, your health care provider may recommend: Varying how long you squeeze your muscles. Doing  several sets of exercises every day. Doing exercises for several weeks. Making Kegel exercises a part of your regular exercise routine. This information is not intended to replace advice given to you by your health care provider. Make sure you discuss any questions you have with your health care provider. Document Revised: 07/21/2020 Document Reviewed: 03/20/2018 Elsevier Patient Education  2022 [place].  Normal BMI (Body Mass Index- based on height and weight) is between 23 and 30. Your BMI today is Body mass index is 22.42 kg/m. Marland Kitchen Please consider follow up  regarding your BMI with your Primary Care Provider.  The [facility] providers would like to encourage you to use [facility] to communicate with providers for non-urgent requests or questions.  Due to long hold times on the telephone, sending your provider a message by [facility] may be a faster and more efficient way to get a response.  Please allow 48 business hours for a response.  Please remember that this is for non-urgent requests.   Thank you for choosing me and [facility].  Pricilla Riffle. Dagoberto Ligas., MD., Marval Regal

## 2021-05-10 NOTE — Progress Notes (Signed)
    History of Present Illness: This is an 80 year old female with GERD with an esophageal stricture, abdominal pain gas, abdominal bloating.  She relates her reflux symptoms are under good control.  She has not had recurrent dysphagia.  She notes frequent gas bloating and generalized abdominal pain that is partially improved with Gas-X for several months.  She takes Gas-X intermittently.  She has occasional problems when passing flatus - occasionally has small amounts of liquid stool pass.  She was hospitalized for hyponatremia in late July.  Her hyponatremia has corrected.  She has a persistent iron deficiency anemia and has had difficulty tolerating oral iron supplements.  EGD 05/2020 - Benign-appearing esophageal stenosis. Dilated. - Large hiatal hernia. - Erosive gastropathy with no bleeding and no stigmata of recent bleeding. Biopsied. - Erythematous mucosa in the gastric body and antrum. Biopsied. - Normal duodenal bulb and second portion of the duodenum. Path: mild reactive gastropathy otherwise negative  Current Medications, Allergies, Past Medical History, Past Surgical History, Family History and Social History were reviewed in [employer] record.   Physical Exam: General: Well developed, well nourished, elderly, no acute distress Head: Normocephalic and atraumatic Eyes: Sclerae anicteric, EOMI Ears: Normal auditory acuity Mouth: Not examined, mask on during Covid-19 pandemic Lungs: Clear throughout to auscultation Heart: Regular rate and rhythm; no murmurs, rubs or bruits Abdomen: Soft, non tender and non distended. No masses, hepatosplenomegaly or hernias noted. Normal Bowel sounds Rectal: Not done Musculoskeletal: Symmetrical with no gross deformities  Pulses:  Normal pulses noted Extremities: No clubbing, cyanosis, edema or deformities noted Neurological: Alert oriented x 4, grossly nonfocal Psychological:  Alert and cooperative. Normal mood and  affect   Assessment and Recommendations:  GERD with an esophageal stricture and a large HH.  History of erosive gastropathy.  Follow antireflux measures long-term.  Continue pantoprazole 40 mg p.o. daily. EC ASA daily is okay.  Minimize additional NSAID usage.  Tums as needed. REV in 1 year. Abdominal pain, abdominal bloating. Dicyclomine 10 mg po tid as needed.  Gas-X or similar product 4 times daily as needed.  She is encouraged to use both medications regularly when she is experiencing abdominal pain, abdominal bloating or gas.  If symptoms are not well controlled within the next 2 to 3 weeks she is advised to call for consideration of further evaluation including CT AP. Intermittent small-volume liquid fecal leakage.  Above measures to reduce intestinal gas.  Begin Kegel exercises at least 5 times daily long-term.  She is advised to call if symptoms are not improved. IDA.  I have advised her to return to her PCP, Dr. Quay Burow for further management.

## 2021-05-13 ENCOUNTER — Encounter: Payer: Self-pay | Admitting: Podiatry

## 2021-05-13 ENCOUNTER — Other Ambulatory Visit: Payer: Self-pay

## 2021-05-13 ENCOUNTER — Ambulatory Visit (INDEPENDENT_AMBULATORY_CARE_PROVIDER_SITE_OTHER): Payer: Medicare Other | Admitting: Podiatry

## 2021-05-13 DIAGNOSIS — M2011 Hallux valgus (acquired), right foot: Secondary | ICD-10-CM | POA: Diagnosis not present

## 2021-05-13 DIAGNOSIS — M216X2 Other acquired deformities of left foot: Secondary | ICD-10-CM

## 2021-05-13 DIAGNOSIS — M76829 Posterior tibial tendinitis, unspecified leg: Secondary | ICD-10-CM

## 2021-05-13 DIAGNOSIS — M2141 Flat foot [pes planus] (acquired), right foot: Secondary | ICD-10-CM | POA: Diagnosis not present

## 2021-05-13 DIAGNOSIS — M2012 Hallux valgus (acquired), left foot: Secondary | ICD-10-CM | POA: Diagnosis not present

## 2021-05-13 DIAGNOSIS — M2142 Flat foot [pes planus] (acquired), left foot: Secondary | ICD-10-CM | POA: Diagnosis not present

## 2021-05-13 DIAGNOSIS — L84 Corns and callosities: Secondary | ICD-10-CM | POA: Diagnosis not present

## 2021-05-13 DIAGNOSIS — E119 Type 2 diabetes mellitus without complications: Secondary | ICD-10-CM | POA: Diagnosis not present

## 2021-05-13 NOTE — Progress Notes (Signed)
This patient presents the office with chief complaint of painful callus on the bottom of her left foot.  She says the calluses have been painful and have limited her walking.  She was last seen in the office in 2018.  She presents the office today for an evaluation and treatment of her painful callus left foot.  Vascular  Dorsalis pedis and posterior tibial pulses are palpable  B/L.  Capillary return  WNL.  Temperature gradient is  WNL.  Skin turgor  WNL  Sensorium  Senn Weinstein monofilament wire  WNL. Normal tactile sensation.  Nail Exam  Patient has normal nails with no evidence of bacterial or fungal infection.  Orthopedic  Exam  Muscle tone and muscle strength  WNL.  No limitations of motion feet  B/L.  No crepitus or joint effusion noted.  Foot type is unremarkable and digits show no abnormalities.  Bony prominences are unremarkable. Pes planus.  Severe  HAV  B/L.  PTTD left foot.  Plantar flexed fifth metatarsal left foot.  Skin  No open lesions.  Normal skin texture and turgor.  Callus sub 1,5 left foot.  Callus left foot.  IE   Debride callus with # 15 blade.  Dispense diabetic insoles for this patient.   RTC 3 months for preventative foot care services.   Gardiner Barefoot DPM

## 2021-05-16 DIAGNOSIS — I959 Hypotension, unspecified: Secondary | ICD-10-CM | POA: Diagnosis not present

## 2021-05-16 DIAGNOSIS — M4726 Other spondylosis with radiculopathy, lumbar region: Secondary | ICD-10-CM | POA: Diagnosis not present

## 2021-05-16 DIAGNOSIS — I10 Essential (primary) hypertension: Secondary | ICD-10-CM | POA: Diagnosis not present

## 2021-05-16 DIAGNOSIS — E871 Hypo-osmolality and hyponatremia: Secondary | ICD-10-CM | POA: Diagnosis not present

## 2021-05-16 DIAGNOSIS — E1140 Type 2 diabetes mellitus with diabetic neuropathy, unspecified: Secondary | ICD-10-CM | POA: Diagnosis not present

## 2021-05-16 DIAGNOSIS — M47817 Spondylosis without myelopathy or radiculopathy, lumbosacral region: Secondary | ICD-10-CM | POA: Diagnosis not present

## 2021-05-20 DIAGNOSIS — H9041 Sensorineural hearing loss, unilateral, right ear, with unrestricted hearing on the contralateral side: Secondary | ICD-10-CM | POA: Diagnosis not present

## 2021-05-20 DIAGNOSIS — H90A21 Sensorineural hearing loss, unilateral, right ear, with restricted hearing on the contralateral side: Secondary | ICD-10-CM | POA: Diagnosis not present

## 2021-05-31 ENCOUNTER — Telehealth: Payer: Self-pay | Admitting: Pharmacist

## 2021-05-31 NOTE — Progress Notes (Signed)
Star Rating Drugs: Atorvastatin - filled 02/28/21 90D Ramipril - filled 04/27/21 90D

## 2021-05-31 NOTE — Progress Notes (Signed)
Chronic Care Management Pharmacy Assistant   Name: Emily Sweeney  MRN: [id number] DOB: Jul 15, 1941  Reason for Encounter: Disease State - General Adherence   Recent office visits:  04/27/21 Emily Sweeney (PCP) - Essential Hypertension. No med changes.  03/22/21 Emily Sweeney (PCP) - Hospital F/u. Essential Hypertension. No med changes. Referral to Orthopedic Surgery.  Recent consult visits:  05/13/21 Emily Sweeney (Podiatry) - Callus. Plantar flexed metatarsal bone of left foot  05/10/21 Emily Sweeney) - Gastroesophageal reflux disease, unspecified whether esophagitis present. Start Dicyclomine 10 mg.  Hospital visits:  Medication Reconciliation was completed by comparing discharge summary, patient's EMR and Pharmacy list, and upon discussion with patient.  Admitted to the hospital on 03/13/21 due to Dizziness. Discharge date was 03/16/21. Discharged from [facility].    CHANGE how you take: atorvastatin (LIPITOR) metoprolol succinate (TOPROL-XL)  STOP taking: gabapentin 100 MG capsule (NEURONTIN) hydrochlorothiazide 25 MG tablet (HYDRODIURIL)  Medications that remain the same after Hospital Discharge:??  -All other medications will remain the same.    Medications: Outpatient Encounter Medications as of 05/31/2021  Medication Sig   aspirin EC 81 MG tablet Take 1 tablet (81 mg total) by mouth daily.   atorvastatin (LIPITOR) 20 MG tablet TAKE 1 TABLET ONCE DAILY.   B Complex Vitamins (VITAMIN B COMPLEX PO) Take 1 tablet by mouth daily.   dicyclomine (BENTYL) 10 MG capsule Take 1 capsule (10 mg total) by mouth 3 (three) times daily before meals.   fluticasone (FLONASE) 50 MCG/ACT nasal spray Place 1 spray into both nostrils as needed for allergies or rhinitis.   meloxicam (MOBIC) 15 MG tablet Take 1 tablet (15 mg total) by mouth daily as needed for pain.   metoprolol succinate (TOPROL-XL) 25 MG 24 hr tablet Take 1.5 tablets (37.5 mg total) by mouth daily.   Multiple Vitamins-Minerals (HAIR SKIN  AND NAILS FORMULA) TABS Take 1 tablet by mouth daily.   pantoprazole (PROTONIX) 40 MG tablet Take 1 tablet (40 mg total) by mouth daily.   Probiotic Product (PROBIOTIC ADVANCED PO) Take 1 capsule by mouth daily.   Propylene Glycol (SYSTANE BALANCE OP) Place 1 drop into both eyes daily as needed (dry eyes).   ramipril (ALTACE) 10 MG capsule Take 1 capsule (10 mg total) by mouth daily.   simethicone (MYLICON) 80 MG chewable tablet Chew 80 mg by mouth every 6 (six) hours as needed for flatulence.   spironolactone (ALDACTONE) 25 MG tablet Take 0.5 tablets (12.5 mg total) by mouth daily.   VITAMIN D, CHOLECALCIFEROL, PO Take 5,000 Units by mouth daily.    No facility-administered encounter medications on file as of 05/31/2021.   Have you had any problems recently with your health? Patient states she is doing much better, she has attended all her consults since leaving the hospital.  Have you had any problems with your pharmacy? Patient states she had a dose change and the pharmacy didn't want to refill her medication but the issue was resolved the same day.   What issues or side effects are you having with your medications? Patient states no side effects or issues from medications.   What would you like me to pass along to Emily Sweeney, CPP for them to help you with?  Patient states Dr. Quay Sweeney asked her to keep a check on her BP and her readings has been within normal range. Her last reading was 130/86.   What can we do to take care of you better? Patient states no concerns at this time.  Care Gaps: Jun 22 2021 HEMOGLOBIN A1C (Every 6 Months) Last completed: Dec 20, 2020   Jan 31 2022 FOOT EXAM (Yearly) Last completed: Jan 31, 2021  Jun 01 2019 OPHTHALMOLOGY EXAM (Yearly) Last completed: May 31, 2018   Dec 18 2019 DEXA SCAN (Every 2 Years) Last completed: Dec 17, 2017  Mar 07 2020 COLONOSCOPY (Pts 61-49 yrs Insurance coverage will need to be confirmed) (Every 5 Years) Last completed:  Mar 08, 2015  Star Rating Drugs: Atorvastatin 11/29/20 90 ds Ramipril 01/23/21 90 ds  Emily Sweeney, [place] Pharmacists Assistant [phone]

## 2021-06-02 ENCOUNTER — Other Ambulatory Visit: Payer: Self-pay | Admitting: Gastroenterology

## 2021-06-02 DIAGNOSIS — K219 Gastro-esophageal reflux disease without esophagitis: Secondary | ICD-10-CM

## 2021-06-02 DIAGNOSIS — H906 Mixed conductive and sensorineural hearing loss, bilateral: Secondary | ICD-10-CM | POA: Diagnosis not present

## 2021-07-18 ENCOUNTER — Other Ambulatory Visit: Payer: Self-pay | Admitting: Internal Medicine

## 2021-07-19 ENCOUNTER — Telehealth: Payer: Self-pay | Admitting: Internal Medicine

## 2021-07-19 MED ORDER — METOPROLOL SUCCINATE ER 25 MG PO TB24: 37.5000 mg | ORAL_TABLET | Freq: Every day | ORAL | 1 refills | Status: DC

## 2021-07-19 NOTE — Telephone Encounter (Signed)
Pharmacy called and states metoprolol succinate (TOPROL-XL) 25 MG 24 hr tablet prescription was sent over for 1/day. However, states pt states she needs to take 1.5/ day. Would like call back.    [place], [place] Phone:  [phone]  Fax:  [phone]

## 2021-08-12 ENCOUNTER — Encounter: Payer: Self-pay | Admitting: Podiatry

## 2021-08-12 ENCOUNTER — Ambulatory Visit (INDEPENDENT_AMBULATORY_CARE_PROVIDER_SITE_OTHER): Payer: Medicare Other | Admitting: Podiatry

## 2021-08-12 ENCOUNTER — Other Ambulatory Visit: Payer: Self-pay

## 2021-08-12 DIAGNOSIS — M76829 Posterior tibial tendinitis, unspecified leg: Secondary | ICD-10-CM | POA: Diagnosis not present

## 2021-08-12 DIAGNOSIS — M2010 Hallux valgus (acquired), unspecified foot: Secondary | ICD-10-CM | POA: Diagnosis not present

## 2021-08-12 DIAGNOSIS — L84 Corns and callosities: Secondary | ICD-10-CM

## 2021-08-12 DIAGNOSIS — M216X2 Other acquired deformities of left foot: Secondary | ICD-10-CM | POA: Diagnosis not present

## 2021-08-12 NOTE — Progress Notes (Signed)
This patient presents the office with chief complaint of painful callus on the bottom of her left foot.  She says the calluses have been painful and have limited her walking.  She was last seen in the office in 2018.  She presents the office today for an evaluation and treatment of her painful callus left foot. Patient says the insoles which were dispensed last visit have helped greatly.  Vascular  Dorsalis pedis and posterior tibial pulses are palpable  B/L.  Capillary return  WNL.  Temperature gradient is  WNL.  Skin turgor  WNL  Sensorium  Senn Weinstein monofilament wire  WNL. Normal tactile sensation.  Nail Exam  Patient has normal nails with no evidence of bacterial or fungal infection.  Orthopedic  Exam  Muscle tone and muscle strength  WNL.  No limitations of motion feet  B/L.  No crepitus or joint effusion noted.  Foot type is unremarkable and digits show no abnormalities.  Bony prominences are unremarkable. Pes planus.  Severe  HAV  B/L.  PTTD left foot.  Plantar flexed fifth metatarsal left foot.  Skin  No open lesions.  Normal skin texture and turgor.  Callus sub 1,5 left foot.  Callus left foot.  Debride callus with # 15 blade.  Dispense diabetic insoles for this patient.   RTC 3 months for preventative foot care services.   Gardiner Barefoot DPM

## 2021-08-14 DIAGNOSIS — S62609A Fracture of unspecified phalanx of unspecified finger, initial encounter for closed fracture: Secondary | ICD-10-CM

## 2021-08-14 HISTORY — DX: Fracture of unspecified phalanx of unspecified finger, initial encounter for closed fracture: S62.609A

## 2021-08-22 DIAGNOSIS — Z9889 Other specified postprocedural states: Secondary | ICD-10-CM | POA: Insufficient documentation

## 2021-08-22 DIAGNOSIS — K1379 Other lesions of oral mucosa: Secondary | ICD-10-CM | POA: Diagnosis not present

## 2021-08-23 DIAGNOSIS — M1712 Unilateral primary osteoarthritis, left knee: Secondary | ICD-10-CM | POA: Diagnosis not present

## 2021-08-30 DIAGNOSIS — K1379 Other lesions of oral mucosa: Secondary | ICD-10-CM | POA: Diagnosis not present

## 2021-10-03 ENCOUNTER — Other Ambulatory Visit: Payer: Self-pay | Admitting: Internal Medicine

## 2021-10-07 ENCOUNTER — Encounter: Payer: Self-pay | Admitting: Internal Medicine

## 2021-10-07 ENCOUNTER — Telehealth (INDEPENDENT_AMBULATORY_CARE_PROVIDER_SITE_OTHER): Payer: Medicare Other | Admitting: Nurse Practitioner

## 2021-10-07 DIAGNOSIS — J029 Acute pharyngitis, unspecified: Secondary | ICD-10-CM

## 2021-10-07 MED ORDER — BENZONATATE 100 MG PO CAPS: 100.0000 mg | ORAL_CAPSULE | Freq: Two times a day (BID) | ORAL | 0 refills | Status: DC | PRN

## 2021-10-07 MED ORDER — AZITHROMYCIN 250 MG PO TABS: ORAL_TABLET | ORAL | 0 refills | Status: AC

## 2021-10-07 NOTE — Progress Notes (Signed)
An audio-only tele-health visit was completed today for this patient. I connected with  Emily Sweeney on 10/07/21 utilizing audio-only technology and verified that I am speaking with the correct person using two identifiers. The patient was located at their home, and I was located at the office of [place] [facility] during the encounter. I discussed the limitations of evaluation and management by telemedicine. The patient expressed understanding and agreed to proceed.    Subjective:  Patient ID: Emily Sweeney, female    DOB: 1941-03-04  Age: 81 y.o. MRN: [id number]  CC:  Chief Complaint  Patient presents with   Cough   Sore Throat      HPI  This patient arrives today for virtual visit for the above.  Symptoms started yesterday.  She is coughing with sore throat and subjective fevers.  She did test today for COVID-19 infection and this was negative.  She tells me her symptoms remind her of strep throat which she has had in the past and she is concerned she is coming down that now.  She is also experiencing congestion and productive cough with shortness of breath.  She has taken Mucinex over-the-counter with minimal improvement in her symptoms.  Past Medical History:  Diagnosis Date   Anemia    Arthritis of left knee 2022   Blood transfusion without reported diagnosis    Diverticulosis of colon    GERD (gastroesophageal reflux disease)    Hearing loss    right ear, and tinnitus   Hiatal hernia    Hyperlipidemia    Hypertension    Hyponatremia 2022   Tubular adenoma 03/05/2015   Polyp, and Benign lymphoid polyp.   UTI (urinary tract infection)       Family History  Problem Relation Age of Onset   Dementia Mother 61   Sudden death Father 42       MI   Coronary artery disease Father    Heart disease Father    Alcohol abuse Brother    Breast cancer Daughter    Colon cancer Neg Hx    Esophageal cancer Neg Hx     Social History   Social  History Narrative   Not on file   Social History   Tobacco Use   Smoking status: Former    Types: Cigarettes    Quit date: 07/04/1991    Years since quitting: 30.2   Smokeless tobacco: Never  Substance Use Topics   Alcohol use: Yes    Alcohol/week: 0.0 standard drinks    Comment: glass wine 4-5 days a week     Current Meds  Medication Sig   aspirin EC 81 MG tablet Take 1 tablet (81 mg total) by mouth daily.   atorvastatin (LIPITOR) 20 MG tablet TAKE 1 TABLET ONCE DAILY.   B Complex Vitamins (VITAMIN B COMPLEX PO) Take 1 tablet by mouth daily.   dicyclomine (BENTYL) 10 MG capsule Take 1 capsule (10 mg total) by mouth 3 (three) times daily before meals.   fluticasone (FLONASE) 50 MCG/ACT nasal spray Place 1 spray into both nostrils as needed for allergies or rhinitis.   meloxicam (MOBIC) 15 MG tablet Take 1 tablet (15 mg total) by mouth daily as needed for pain.   metoprolol succinate (TOPROL-XL) 25 MG 24 hr tablet Take 1.5 tablets (37.5 mg total) by mouth daily.   Multiple Vitamins-Minerals (HAIR SKIN AND NAILS FORMULA) TABS Take 1 tablet by mouth daily.   pantoprazole (PROTONIX) 40 MG tablet  TAKE 1 TABLET ONCE DAILY.   Probiotic Product (PROBIOTIC ADVANCED PO) Take 1 capsule by mouth daily.   Propylene Glycol (SYSTANE BALANCE OP) Place 1 drop into both eyes daily as needed (dry eyes).   ramipril (ALTACE) 10 MG capsule Take 1 capsule (10 mg total) by mouth daily.   simethicone (MYLICON) 80 MG chewable tablet Chew 80 mg by mouth every 6 (six) hours as needed for flatulence.   spironolactone (ALDACTONE) 25 MG tablet Take 0.5 tablets (12.5 mg total) by mouth daily.   azithromycin (ZITHROMAX) 250 MG tablet Take 2 tablets on day 1, then 1 tablet daily on days 2 through 5   benzonatate (TESSALON) 100 MG capsule Take 1 capsule (100 mg total) by mouth 2 (two) times daily as needed for cough.    ROS:  Review of Systems  Constitutional:  Positive for chills. Negative for fever.  HENT:   Positive for congestion and sore throat (aching).   Respiratory:  Positive for cough, shortness of breath and wheezing. Negative for sputum production.   Cardiovascular:  Negative for chest pain.  Musculoskeletal:  Positive for myalgias.  Neurological:  Negative for headaches.    Objective:   Today's Vitals: There were no vitals taken for this visit. Vitals with BMI 10/07/2021 05/10/2021 04/27/2021  Height - 5' 6.5" 5' 7.5"  Weight - 141 lbs 141 lbs  BMI - 35.57 32.20  Systolic (No Data) 254 270  Diastolic (No Data) 70 76  Pulse - 78 95     Physical Exam Comprehensive physical exam not completed today as office visit was conducted remotely.  Patient sounded fairly well over the phone, she was able to speak in complete sentences without having to stop to catch her breath.  She was coughing frequently.  Patient was alert and oriented, and appeared to have appropriate judgment.       Assessment and Plan   1. Pharyngitis, unspecified etiology      Plan: 1.  Unfortunately I am unable to get her swabbed this is a virtual visit to rule out strep throat.  However she tells me she has had this in the past and it feels like she has strep throat again. She has multiple allergies to antibiotics but does tolerate Z-Pak.  We will prescribe that today, will also order Ladona Ridgel that she can take as needed for cough suppression.  Encouraged her to continue using her Mucinex as needed as well as Flonase nasal spray.   Tests ordered No orders of the defined types were placed in this encounter.     Meds ordered this encounter  Medications   azithromycin (ZITHROMAX) 250 MG tablet    Sig: Take 2 tablets on day 1, then 1 tablet daily on days 2 through 5    Dispense:  6 tablet    Refill:  0    Order Specific Question:   Supervising Provider    Answer:   BURNS, Claudina Lick [[id number]]   benzonatate (TESSALON) 100 MG capsule    Sig: Take 1 capsule (100 mg total) by mouth 2 (two) times daily  as needed for cough.    Dispense:  20 capsule    Refill:  0    Order Specific Question:   Supervising Provider    Answer:   Binnie Rail [[id number]]    Patient to follow-up if symptoms worsen over the weekend she was told to call the office early next week.  Total time spent with telephone today was 10 minutes.  Ailene Ards, NP

## 2021-10-25 ENCOUNTER — Ambulatory Visit: Payer: Medicare Other | Admitting: Internal Medicine

## 2021-10-27 ENCOUNTER — Encounter: Payer: Self-pay | Admitting: Internal Medicine

## 2021-10-27 NOTE — Progress Notes (Signed)
? ? ? ? ?Subjective:  ? ? Patient ID: Emily Sweeney, female    DOB: 07-Sep-1940, 81 y.o.   MRN: [id number] ? ?This visit occurred during the SARS-CoV-2 public health emergency.  Safety protocols were in place, including screening questions prior to the visit, additional usage of staff PPE, and extensive cleaning of exam room while observing appropriate contact time as indicated for disinfecting solutions.   ? ? ?HPI ?Elham is here for follow up of her chronic medical problems, including DM, htn, hld, knee OA, h/o falls, gerd ? ?Knee - sees ortho, has had steroid injections - did not help much.  May need to do gel injections next.  The knee pain limits how much she can walk the dog.   ? ?A little sore throat and congestion.  The end of last month she did have a cold and was given a Z-Pak and cough medication that did help.  She just started getting the symptoms recently. ? ?Due for Dexa- [place] ? ?BP at home has been good.  ? ?Medications and allergies reviewed with patient and updated if appropriate. ? ?Current Outpatient Medications on File Prior to Visit  ?Medication Sig Dispense Refill  ? aspirin EC 81 MG tablet Take 1 tablet (81 mg total) by mouth daily. 90 tablet 1  ? atorvastatin (LIPITOR) 20 MG tablet TAKE 1 TABLET ONCE DAILY. 90 tablet 3  ? B Complex Vitamins (VITAMIN B COMPLEX PO) Take 1 tablet by mouth daily.    ? benzonatate (TESSALON) 100 MG capsule Take 1 capsule (100 mg total) by mouth 2 (two) times daily as needed for cough. 20 capsule 0  ? dicyclomine (BENTYL) 10 MG capsule Take 1 capsule (10 mg total) by mouth 3 (three) times daily before meals. 90 capsule 11  ? fluticasone (FLONASE) 50 MCG/ACT nasal spray Place 1 spray into both nostrils as needed for allergies or rhinitis.    ? meloxicam (MOBIC) 15 MG tablet Take 1 tablet (15 mg total) by mouth daily as needed for pain.    ? metoprolol succinate (TOPROL-XL) 25 MG 24 hr tablet Take 1.5 tablets (37.5 mg total) by mouth daily. 135 tablet 1  ?  Multiple Vitamins-Minerals (HAIR SKIN AND NAILS FORMULA) TABS Take 1 tablet by mouth daily.    ? pantoprazole (PROTONIX) 40 MG tablet TAKE 1 TABLET ONCE DAILY. 90 tablet 3  ? Probiotic Product (PROBIOTIC ADVANCED PO) Take 1 capsule by mouth daily.    ? Propylene Glycol (SYSTANE BALANCE OP) Place 1 drop into both eyes daily as needed (dry eyes).    ? ramipril (ALTACE) 10 MG capsule Take 1 capsule (10 mg total) by mouth daily. 90 capsule 3  ? simethicone (MYLICON) 80 MG chewable tablet Chew 80 mg by mouth every 6 (six) hours as needed for flatulence.    ? spironolactone (ALDACTONE) 25 MG tablet Take 0.5 tablets (12.5 mg total) by mouth daily. 45 tablet 1  ? VITAMIN D, CHOLECALCIFEROL, PO Take 5,000 Units by mouth daily.    ? ?No current facility-administered medications on file prior to visit.  ? ? ? ?Review of Systems  ?Constitutional:  Negative for chills and fever.  ?HENT:  Positive for congestion, postnasal drip, sinus pressure (below right eye) and sore throat. Negative for ear pain (sometimes ear pressure).   ?Respiratory:  Positive for cough (mild - a little congestion in chest) and wheezing (since recent URI). Negative for shortness of breath.   ?Cardiovascular:  Negative for chest pain, palpitations and leg swelling.  ?  Gastrointestinal:  Negative for abdominal pain and nausea.  ?     Jerrye Bushy controlled  ?Musculoskeletal:  Positive for arthralgias (knees).  ?Neurological:  Positive for light-headedness (once in a while). Negative for headaches.  ? ?   ?Objective:  ? ?Vitals:  ? 10/28/21 0919  ?BP: 140/78  ?Pulse: 92  ?Temp: 98.2 ?F (36.8 ?C)  ?SpO2: 96%  ? ?BP Readings from Last 3 Encounters:  ?10/28/21 140/78  ?05/10/21 128/70  ?04/27/21 130/76  ? ?Wt Readings from Last 3 Encounters:  ?10/28/21 145 lb (65.8 kg)  ?05/10/21 141 lb (64 kg)  ?04/27/21 141 lb (64 kg)  ? ?Body mass index is 23.05 kg/m?. ? ?  ?Physical Exam ?Constitutional:   ?   General: She is not in acute distress. ?   Appearance: Normal appearance.   ?HENT:  ?   Head: Normocephalic and atraumatic.  ?   Right Ear: Tympanic membrane, ear canal and external ear normal.  ?   Left Ear: Tympanic membrane, ear canal and external ear normal.  ?   Mouth/Throat:  ?   Mouth: Mucous membranes are moist.  ?   Pharynx: No posterior oropharyngeal erythema.  ?Eyes:  ?   Conjunctiva/sclera: Conjunctivae normal.  ?Cardiovascular:  ?   Rate and Rhythm: Normal rate and regular rhythm.  ?   Heart sounds: Normal heart sounds. No murmur heard. ?Pulmonary:  ?   Effort: Pulmonary effort is normal. No respiratory distress.  ?   Breath sounds: Normal breath sounds. No wheezing.  ?Musculoskeletal:  ?   Cervical back: Neck supple.  ?   Right lower leg: No edema.  ?   Left lower leg: No edema.  ?Lymphadenopathy:  ?   Cervical: No cervical adenopathy.  ?Skin: ?   Findings: No rash.  ?Neurological:  ?   Mental Status: She is alert. Mental status is at baseline.  ?Psychiatric:     ?   Mood and Affect: Mood normal.     ?   Behavior: Behavior normal.  ? ?   ? ?Lab Results  ?Component Value Date  ? WBC 5.9 03/15/2021  ? HGB 10.9 (L) 03/15/2021  ? HCT 33.2 (L) 03/15/2021  ? PLT 257 03/15/2021  ? GLUCOSE 92 03/22/2021  ? CHOL 178 12/20/2020  ? TRIG 66.0 12/20/2020  ? HDL 84.50 12/20/2020  ? [place] 80 12/20/2020  ? ALT 10 12/20/2020  ? AST 17 12/20/2020  ? NA 134 (L) 03/22/2021  ? K 4.2 03/22/2021  ? CL 99 03/22/2021  ? CREATININE 0.88 03/22/2021  ? BUN 9 03/22/2021  ? CO2 25 03/22/2021  ? TSH 1.566 03/13/2021  ? INR 1.0 06/09/2015  ? HGBA1C 6.9 (H) 12/20/2020  ? ? ? ?Assessment & Plan:  ? ? ?See Problem List for Assessment and Plan of chronic medical problems.  ? ? ?

## 2021-10-27 NOTE — Patient Instructions (Addendum)
? ? ? ?  Blood work was ordered.   ? ? ? ?Medications changes include :   none ? ? ? ? ?Return in about 6 months (around 04/30/2022) for follow up, Schedule DEXA-[place]. ? ?

## 2021-10-28 ENCOUNTER — Other Ambulatory Visit: Payer: Self-pay

## 2021-10-28 ENCOUNTER — Ambulatory Visit (INDEPENDENT_AMBULATORY_CARE_PROVIDER_SITE_OTHER): Payer: Medicare Other | Admitting: Internal Medicine

## 2021-10-28 VITALS — BP 140/78 | HR 92 | Temp 98.2°F | Ht 66.5 in | Wt 145.0 lb

## 2021-10-28 DIAGNOSIS — M85851 Other specified disorders of bone density and structure, right thigh: Secondary | ICD-10-CM

## 2021-10-28 DIAGNOSIS — E559 Vitamin D deficiency, unspecified: Secondary | ICD-10-CM

## 2021-10-28 DIAGNOSIS — M25562 Pain in left knee: Secondary | ICD-10-CM

## 2021-10-28 DIAGNOSIS — E119 Type 2 diabetes mellitus without complications: Secondary | ICD-10-CM

## 2021-10-28 DIAGNOSIS — K219 Gastro-esophageal reflux disease without esophagitis: Secondary | ICD-10-CM

## 2021-10-28 DIAGNOSIS — G8929 Other chronic pain: Secondary | ICD-10-CM

## 2021-10-28 DIAGNOSIS — D509 Iron deficiency anemia, unspecified: Secondary | ICD-10-CM | POA: Diagnosis not present

## 2021-10-28 DIAGNOSIS — E785 Hyperlipidemia, unspecified: Secondary | ICD-10-CM

## 2021-10-28 DIAGNOSIS — J069 Acute upper respiratory infection, unspecified: Secondary | ICD-10-CM | POA: Diagnosis not present

## 2021-10-28 DIAGNOSIS — I10 Essential (primary) hypertension: Secondary | ICD-10-CM

## 2021-10-28 NOTE — Assessment & Plan Note (Signed)
Acute ?Has current symptoms of URI-sore throat, mild cough with congestion ?Had recent URI a few weeks ago and a Z-Pak helped ?Her symptoms are very mild and just started ?Likely viral in nature ?Discussed symptomatic treatment ?She will call me if her symptoms worsen or do not improve ?

## 2021-10-28 NOTE — Assessment & Plan Note (Signed)
Chronic ?DEXA due-ordered ?Encouraged regular exercise ?Encouraged taking calcium and vitamin D daily ?

## 2021-10-28 NOTE — Assessment & Plan Note (Signed)
Chronic Taking vitamin D daily Check vitamin D level  

## 2021-10-28 NOTE — Assessment & Plan Note (Signed)
Chronic °Regular exercise and healthy diet encouraged °Check lipid panel  °Continue atorvastatin 20 mg daily °

## 2021-10-28 NOTE — Assessment & Plan Note (Signed)
Chronic GERD controlled Continue pantoprazole 40 mg daily 

## 2021-10-28 NOTE — Assessment & Plan Note (Signed)
Chronic ?Lab Results  ?Component Value Date  ? HGBA1C 6.9 (H) 12/20/2020  ? ?Sugars controlled controlled ?Check A1c, urine microalbumin today ?Continue lifestyle control ?Stressed regular exercise, diabetic diet ? ? ?

## 2021-10-28 NOTE — Assessment & Plan Note (Signed)
History of iron deficiency anemia ?CBC, iron panel ?No symptoms of active bleeding ?

## 2021-10-28 NOTE — Assessment & Plan Note (Signed)
Chronic Blood pressure well controlled CMP Continue metoprolol XL 37.5 mg daily, ramipril 10 mg daily, spironolactone 12.5 mg daily 

## 2021-10-28 NOTE — Assessment & Plan Note (Signed)
Chronic ?Related to severe osteoarthritis ?Following with orthopedics and having steroid injections ?

## 2021-11-04 ENCOUNTER — Other Ambulatory Visit: Payer: Self-pay

## 2021-11-04 ENCOUNTER — Ambulatory Visit (INDEPENDENT_AMBULATORY_CARE_PROVIDER_SITE_OTHER)
Admission: RE | Admit: 2021-11-04 | Discharge: 2021-11-04 | Disposition: A | Discharge status: Home / Self Care | Payer: Medicare Other | Source: Ambulatory Visit | Attending: Internal Medicine | Admitting: Internal Medicine

## 2021-11-04 ENCOUNTER — Encounter: Payer: Self-pay | Admitting: Internal Medicine

## 2021-11-04 DIAGNOSIS — M85851 Other specified disorders of bone density and structure, right thigh: Secondary | ICD-10-CM

## 2021-11-11 ENCOUNTER — Ambulatory Visit (INDEPENDENT_AMBULATORY_CARE_PROVIDER_SITE_OTHER): Payer: Medicare Other | Admitting: Podiatry

## 2021-11-11 ENCOUNTER — Encounter: Payer: Self-pay | Admitting: Podiatry

## 2021-11-11 DIAGNOSIS — L84 Corns and callosities: Secondary | ICD-10-CM

## 2021-11-11 DIAGNOSIS — M216X2 Other acquired deformities of left foot: Secondary | ICD-10-CM

## 2021-11-11 DIAGNOSIS — M2010 Hallux valgus (acquired), unspecified foot: Secondary | ICD-10-CM

## 2021-11-11 NOTE — Progress Notes (Signed)
This patient presents the office with chief complaint of painful callus on the bottom of her left foot.  She says the calluses have been painful and have limited her walking.  She was last seen in the office in 2018.  She presents the office today for an evaluation and treatment of her painful callus left foot. Patient says the insoles which were dispensed last visit have helped greatly.  Vascular  Dorsalis pedis and posterior tibial pulses are palpable  B/L.  Capillary return  WNL.  Temperature gradient is  WNL.  Skin turgor  WNL  Sensorium  Senn Weinstein monofilament wire  WNL. Normal tactile sensation.  Nail Exam  Patient has normal nails with no evidence of bacterial or fungal infection.  Orthopedic  Exam  Muscle tone and muscle strength  WNL.  No limitations of motion feet  B/L.  No crepitus or joint effusion noted.  Foot type is unremarkable and digits show no abnormalities.  Bony prominences are unremarkable. Pes planus.  Severe  HAV  B/L.  PTTD left foot.  Plantar flexed fifth metatarsal left foot.  Skin  No open lesions.  Normal skin texture and turgor.  Callus sub 1,5 left foot.  Callus left foot.  Debride callus with # 15 blade.    RTC 4 months for preventative foot care services.   Geary Rufo DPM  

## 2021-11-13 NOTE — Progress Notes (Signed)
Virtual Visit via Video Note ? ?I connected with Emily Sweeney on 11/13/21 at  8:50 AM EDT by a video enabled telemedicine application and verified that I am speaking with the correct person using two identifiers. ?  ?I discussed the limitations of evaluation and management by telemedicine and the availability of in person appointments. The patient expressed understanding and agreed to proceed. ? ?Present for the visit:  Myself, Dr Billey Gosling, Emily Sweeney.  The patient is currently at home and I am in the office.   ? ?No referring provider.  ? ? ?History of Present Illness: ?This visit is to discuss treatment for OP.  ? ? ?DEXA 11/04/21: ?Clinical data: Pt is a 81 y.o. female without previous history of fragility fracture. On vitamin D. ?  ?Results: ?  Lumbar spine L1-L4 (L2) Femoral neck (FN) 33% distal radius  ?T-score  +2.0% RFN: -1.4 ?LFN: -1.4 n/a  ?Change in BMD from previous DXA test (%)  +4.7%* -5.9%* n/a  ?(*) statistically significant ?  ?Assessment: the BMD is low according to the [facility] classification for osteoporosis (see below). ?Fracture risk: moderate ?FRAX score: 10 year major osteoporotic risk: 18%. 10 year hip fracture risk: 4.3%. ? ?Only fracture was dropping a frozen [place] on her toe.   ? ?Walking her dog - to the end of the block and back.  She is taking vitamin D daily.  She does not take calcium - it upset her stomach ? ? ?Social History  ? ?Socioeconomic History  ? Marital status: Divorced  ?  Spouse name: Not on file  ? Number of children: Not on file  ? Years of education: Not on file  ? Highest education level: Not on file  ?Occupational History  ? Occupation: psychologist  ?Tobacco Use  ? Smoking status: Former  ?  Types: Cigarettes  ?  Quit date: 07/04/1991  ?  Years since quitting: 30.3  ? Smokeless tobacco: Never  ?Vaping Use  ? Vaping Use: Never used  ?Substance and Sexual Activity  ? Alcohol use: Yes  ?  Alcohol/week: 0.0 standard drinks  ?  Comment: glass wine 4-5 days a week   ? Drug use: No  ? Sexual activity: Not Currently  ?Other Topics Concern  ? Not on file  ?Social History Narrative  ? Not on file  ? ?Social Determinants of Health  ? ?Financial Resource Strain: Low Risk   ? Difficulty of Paying Living Expenses: Not hard at all  ?Food Insecurity: No Food Insecurity  ? Worried About Charity fundraiser in the Last Year: Never true  ? Ran Out of Food in the Last Year: Never true  ?Transportation Needs: No Transportation Needs  ? Lack of Transportation (Medical): No  ? Lack of Transportation (Non-Medical): No  ?Physical Activity: Sufficiently Active  ? Days of Exercise per Week: 5 days  ? Minutes of Exercise per Session: 30 min  ?Stress: No Stress Concern Present  ? Feeling of Stress : Not at all  ?Social Connections: Socially Isolated  ? Frequency of Communication with Friends and Family: More than three times a week  ? Frequency of Social Gatherings with Friends and Family: Three times a week  ? Attends Religious Services: Never  ? Active Member of Clubs or Organizations: No  ? Attends Archivist Meetings: Never  ? Marital Status: Divorced  ? ?  ?Observations/Objective: ?Appears well in NAD ? ? ?Assessment and Plan: ? ?See Problem List for Assessment and Plan of chronic  medical problems. ? ? ?Follow Up Instructions: ? ?  ?I discussed the assessment and treatment plan with the patient. The patient was provided an opportunity to ask questions and all were answered. The patient agreed with the plan and demonstrated an understanding of the instructions. ?  ?The patient was advised to call back or seek an in-person evaluation if the symptoms worsen or if the condition fails to improve as anticipated. ? ? ?23 minutes were spent face-to-face with her virtually.  The time was spent counseling regarding her severe osteopenia with a high FRAX, including reviewing bone density report, discussing causes of decreased bone density and risk of fracture.  We discussed treatment with  calcium/vitamin d, exercise and medication options including fosamax, prolia, reclast.  Each medication was discussed including the possible side effects.  ? ? ?Binnie Rail, MD ? ?

## 2021-11-14 ENCOUNTER — Telehealth (INDEPENDENT_AMBULATORY_CARE_PROVIDER_SITE_OTHER): Payer: Medicare Other | Admitting: Internal Medicine

## 2021-11-14 ENCOUNTER — Encounter: Payer: Self-pay | Admitting: Internal Medicine

## 2021-11-14 DIAGNOSIS — M810 Age-related osteoporosis without current pathological fracture: Secondary | ICD-10-CM

## 2021-11-14 NOTE — Assessment & Plan Note (Signed)
New ?Reviewed bone density and discussed why guidelines suggest that she consider treatment for her bones ?She is taking vitamin D daily, but does not tolerate calcium-it upsets her stomach.  She feels like she gets a good amount of calcium in her diet ?She is walking on a daily basis ?Encouraged increasing walking if possible and making sure she is getting enough calcium in her diet ?Continue vitamin D ?Discussed options for treatment and possible side effects ?We both agree that at this point we will hold off on treatment of osteoporosis.  She did have a fracture once and it was related to an incident that is not correlated with week wellness ?Recheck DEXA in 2 years ?

## 2021-11-15 ENCOUNTER — Telehealth: Payer: Self-pay

## 2021-11-15 NOTE — Chronic Care Management (AMB) (Signed)
? ? ?  Chronic Care Management ?Pharmacy Assistant  ? ?Name: Emily Sweeney  MRN: [id number] DOB: 17-May-1941 ? ?Emily Sweeney is an 81 y.o. year old female who was called to reschedule appointment on 11/16/21 with Charlene Brooke to [place] on 01/18/22 @ 1:45pm for a follow-up  ? ?Ethelene Hal ?Clinical Pharmacist Assistant ?[phone]  ?

## 2021-11-16 ENCOUNTER — Telehealth: Payer: Medicare Other

## 2022-01-02 ENCOUNTER — Other Ambulatory Visit (INDEPENDENT_AMBULATORY_CARE_PROVIDER_SITE_OTHER): Payer: Medicare Other

## 2022-01-02 DIAGNOSIS — E559 Vitamin D deficiency, unspecified: Secondary | ICD-10-CM

## 2022-01-02 DIAGNOSIS — I10 Essential (primary) hypertension: Secondary | ICD-10-CM | POA: Diagnosis not present

## 2022-01-02 DIAGNOSIS — D509 Iron deficiency anemia, unspecified: Secondary | ICD-10-CM

## 2022-01-02 DIAGNOSIS — E785 Hyperlipidemia, unspecified: Secondary | ICD-10-CM | POA: Diagnosis not present

## 2022-01-02 DIAGNOSIS — E119 Type 2 diabetes mellitus without complications: Secondary | ICD-10-CM

## 2022-01-02 DIAGNOSIS — E875 Hyperkalemia: Secondary | ICD-10-CM

## 2022-01-02 LAB — CBC WITH DIFFERENTIAL/PLATELET
Basophils Absolute: 0 10*3/uL (ref 0.0–0.1)
Basophils Relative: 0.7 % (ref 0.0–3.0)
Eosinophils Absolute: 0.1 10*3/uL (ref 0.0–0.7)
Eosinophils Relative: 2.2 % (ref 0.0–5.0)
HCT: 36.1 % (ref 36.0–46.0)
Hemoglobin: 11.8 g/dL — ABNORMAL LOW (ref 12.0–15.0)
Lymphocytes Relative: 26.7 % (ref 12.0–46.0)
Lymphs Abs: 1.7 10*3/uL (ref 0.7–4.0)
MCHC: 32.8 g/dL (ref 30.0–36.0)
MCV: 88.5 fl (ref 78.0–100.0)
Monocytes Absolute: 0.7 10*3/uL (ref 0.1–1.0)
Monocytes Relative: 11.5 % (ref 3.0–12.0)
Neutro Abs: 3.8 10*3/uL (ref 1.4–7.7)
Neutrophils Relative %: 58.9 % (ref 43.0–77.0)
Platelets: 211 10*3/uL (ref 150.0–400.0)
RBC: 4.08 Mil/uL (ref 3.87–5.11)
RDW: 14.8 % (ref 11.5–15.5)
WBC: 6.5 10*3/uL (ref 4.0–10.5)

## 2022-01-02 LAB — IBC PANEL
Iron: 52 ug/dL (ref 42–145)
Saturation Ratios: 10.8 % — ABNORMAL LOW (ref 20.0–50.0)
TIBC: 481.6 ug/dL — ABNORMAL HIGH (ref 250.0–450.0)
Transferrin: 344 mg/dL (ref 212.0–360.0)

## 2022-01-02 LAB — FERRITIN: Ferritin: 8.9 ng/mL — ABNORMAL LOW (ref 10.0–291.0)

## 2022-01-02 LAB — HEMOGLOBIN A1C: Hgb A1c MFr Bld: 6.4 % (ref 4.6–6.5)

## 2022-01-02 LAB — COMPREHENSIVE METABOLIC PANEL
ALT: 10 U/L (ref 0–35)
AST: 17 U/L (ref 0–37)
Albumin: 4.3 g/dL (ref 3.5–5.2)
Alkaline Phosphatase: 72 U/L (ref 39–117)
BUN: 20 mg/dL (ref 6–23)
CO2: 24 mEq/L (ref 19–32)
Calcium: 9.6 mg/dL (ref 8.4–10.5)
Chloride: 101 mEq/L (ref 96–112)
Creatinine, Ser: 1.1 mg/dL (ref 0.40–1.20)
GFR: 46.72 mL/min — ABNORMAL LOW (ref >60.00)
Glucose, Bld: 111 mg/dL — ABNORMAL HIGH (ref 70–99)
Potassium: 5.5 mEq/L — ABNORMAL HIGH (ref 3.5–5.1)
Sodium: 134 mEq/L — ABNORMAL LOW (ref 135–145)
Total Bilirubin: 0.5 mg/dL (ref 0.2–1.2)
Total Protein: 7 g/dL (ref 6.0–8.3)

## 2022-01-02 LAB — MICROALBUMIN / CREATININE URINE RATIO
Creatinine,U: 43.9 mg/dL
Microalb Creat Ratio: 2.6 mg/g (ref 0.0–30.0)
Microalb, Ur: 1.1 mg/dL (ref 0.0–1.9)

## 2022-01-02 LAB — VITAMIN D 25 HYDROXY (VIT D DEFICIENCY, FRACTURES): VITD: 47.38 ng/mL (ref 30.00–100.00)

## 2022-01-18 ENCOUNTER — Ambulatory Visit (INDEPENDENT_AMBULATORY_CARE_PROVIDER_SITE_OTHER): Payer: Medicare Other

## 2022-01-18 DIAGNOSIS — I10 Essential (primary) hypertension: Secondary | ICD-10-CM

## 2022-01-18 DIAGNOSIS — M810 Age-related osteoporosis without current pathological fracture: Secondary | ICD-10-CM

## 2022-01-18 DIAGNOSIS — E785 Hyperlipidemia, unspecified: Secondary | ICD-10-CM

## 2022-01-18 NOTE — Progress Notes (Signed)
Chronic Care Management Pharmacy Note  01/18/2022 Name:  Emily Sweeney MRN:  [id number] DOB:  1941/06/30  Summary: -Pt endorses compliance with current medications, denies any issues or concerns  -Reviewed DEXA scan with PCP recently, mutually agreed to continue current treatment for osteoporosis, pt reports that she is very cautious to avoid falls / has supports throughout her house to help prevent falls  -BP controlled at home, averaging 120-130/65-70's typically   Recommendations/Changes made from today's visit: -Recommending no changes to medications at this time, pt agreeable to continue to monitor BP, to reach out should BP average >140/90  Plan: -F/u in 8-12 months    Subjective: Emily Sweeney is an 81 y.o. year old female who is a primary patient of Burns, Emily Lick, MD.  The CCM team was consulted for assistance with disease management and care coordination needs.    Engaged with patient by telephone for follow up visit in response to provider referral for pharmacy case management and/or care coordination services.   Consent to Services:  The patient was given the following information about Chronic Care Management services today, agreed to services, and gave verbal consent: 1. CCM service includes personalized support from designated clinical staff supervised by the primary care provider, including individualized plan of care and coordination with other care providers 2. 24/7 contact phone numbers for assistance for urgent and routine care needs. 3. Service will only be billed when office clinical staff spend 20 minutes or more in a month to coordinate care. 4. Only one practitioner may furnish and bill the service in a calendar month. 5.The patient may stop CCM services at any time (effective at the end of the month) by phone call to the office staff. 6. The patient will be responsible for cost sharing (co-pay) of up to 20% of the service fee (after annual deductible is met).  Patient agreed to services and consent obtained.  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 She lost her dog March 2022 which has been very hard on her.   Recent office visits: 11/14/2021 - Dr. Quay Burow - VV - osteoporosis, pt unable to take calcium, provider and pt agreed to hold on further treatment, continue vitamin D daily, calcium supplemented through diet  10/28/2021 - Dr. Quay Burow - no changes to medications - DEXA scan ordered - f/u in 6 months  10/07/2021 - Jeralyn Ruths NP - Pharyngitis - azithromycin and benzonatate rx'd   Recent consult visits: 11/11/2021 - Dr. Prudence Davidson - Podiatry - callus - no changes to medications   Hospital visits: None in previous 6 months  Objective:  Lab Results  Component Value Date   CREATININE 1.10 01/02/2022   BUN 20 01/02/2022   GFR 46.72 (L) 01/02/2022   GFRNONAA >60 03/16/2021   GFRAA >60 06/13/2018   NA 134 (L) 01/02/2022   K 5.5 No hemolysis seen (H) 01/02/2022   CALCIUM 9.6 01/02/2022   CO2 24 01/02/2022   GLUCOSE 111 (H) 01/02/2022    Lab Results  Component Value Date/Time   HGBA1C 6.4 01/02/2022 09:23 AM   HGBA1C 6.9 (H) 12/20/2020 10:39 AM   GFR 46.72 (L) 01/02/2022 09:23 AM   GFR 61.40 03/22/2021 12:23 PM   MICROALBUR  1.1 01/02/2022 09:23 AM    Last diabetic Eye exam:  Lab Results  Component Value Date/Time   HMDIABEYEEXA No Retinopathy 05/31/2018 09:53 AM    Last diabetic Foot exam: No results found for: HMDIABFOOTEX   Lab Results  Component Value Date   CHOL 178 12/20/2020   HDL 84.50 12/20/2020   LDLCALC 80 12/20/2020   TRIG 66.0 12/20/2020   CHOLHDL 2 12/20/2020       Latest Ref Rng & Units  01/02/2022    9:23 AM 12/20/2020   10:39 AM 05/06/2020    3:49 PM  Hepatic Function  Total Protein 6.0 - 8.3 g/dL 7.0   6.9   7.2    Albumin 3.5 - 5.2 g/dL 4.3   4.2   4.4    AST 0 - 37 U/L $Remo'17   17   16    'ugTfG$ ALT 0 - 35 U/L $Remo'10   10   10    'ObnUD$ Alk Phosphatase 39 - 117 U/L 72   69   68    Total Bilirubin 0.2 - 1.2 mg/dL 0.5   0.7   0.6      Lab Results  Component Value Date/Time   TSH 1.566 03/13/2021 06:15 PM   TSH 1.23 06/30/2016 10:02 AM   TSH 1.77 10/14/2015 11:44 AM       Latest Ref Rng & Units 01/02/2022    9:23 AM 03/15/2021    3:01 AM 03/14/2021    5:41 AM  CBC  WBC 4.0 - 10.5 K/uL 6.5   5.9   5.3    Hemoglobin 12.0 - 15.0 g/dL 11.8   10.9   10.7    Hematocrit 36.0 - 46.0 % 36.1   33.2   32.2    Platelets 150.0 - 400.0 K/uL 211.0   257   217      Lab Results  Component Value Date/Time   VD25OH 47.38 01/02/2022 09:23 AM   VD25OH 57.17 12/20/2020 10:39 AM    Clinical ASCVD: Yes  The ASCVD Risk score (Arnett DK, et al., 2019) failed to calculate for the following reasons:   The 2019 ASCVD risk score is only valid for ages 52 to 69       02/21/2021    8:40 AM 12/17/2019    9:05 AM 12/16/2018    8:42 AM  Depression screen PHQ 2/9  Decreased Interest 0 0 0  Down, Depressed, Hopeless 0 0 0  PHQ - 2 Score 0 0 0     Social History   Tobacco Use  Smoking Status Former   Types: Cigarettes   Quit date: 07/04/1991   Years since quitting: 30.5  Smokeless Tobacco Never   BP Readings from Last 3 Encounters:  10/28/21 140/78  05/10/21 128/70  04/27/21 130/76   Pulse Readings from Last 3 Encounters:  10/28/21 92  05/10/21 78  04/27/21 95   Wt Readings from Last 3 Encounters:  10/28/21 145 lb (65.8 kg)  05/10/21 141 lb (64 kg)  04/27/21 141 lb (64 kg)   BMI Readings from Last 3 Encounters:  10/28/21 23.05 kg/m  05/10/21 22.42 kg/m  04/27/21 21.76 kg/m    Assessment/Interventions: Review of patient past medical history, allergies, medications, health status,  including review of consultants reports, laboratory and other test data, was performed as part of comprehensive evaluation and provision of chronic care management services.   SDOH:  (Social Determinants of Health) assessments and interventions performed: Yes   SDOH Screenings   Alcohol  Screen: Low Risk    Last Alcohol Screening Score (AUDIT): 2  Depression (PHQ2-9): Low Risk    PHQ-2 Score: 0  Financial Resource Strain: Low Risk    Difficulty of Paying Living Expenses: Not hard at all  Food Insecurity: No Food Insecurity   Worried About Charity fundraiser in the Last Year: Never true   Ran Out of Food in the Last Year: Never true  Housing: Low Risk    Last Housing Risk Score: 0  Physical Activity: Sufficiently Active   Days of Exercise per Week: 5 days   Minutes of Exercise per Session: 30 min  Social Connections: Socially Isolated   Frequency of Communication with Friends and Family: More than three times a week   Frequency of Social Gatherings with Friends and Family: Three times a week   Attends Religious Services: Never   Active Member of Clubs or Organizations: No   Attends Archivist Meetings: Never   Marital Status: Divorced  Stress: No Stress Concern Present   Feeling of Stress : Not at all  Tobacco Use: Medium Risk   Smoking Tobacco Use: Former   Smokeless Tobacco Use: Never   Passive Exposure: Not on file  Transportation Needs: No Transportation Needs   Lack of Transportation (Medical): No   Lack of Transportation (Non-Medical): No    CCM Care Plan  Allergies  Allergen Reactions   Macrobid [Nitrofurantoin Macrocrystal] Other (See Comments)    Fever, pain, fatigue, no appetite   Norvasc [Amlodipine Besylate]     Palpitations, low bp   Ceftin [Cefuroxime Axetil] Hives and Rash   Ciprofloxacin Rash   Penicillins Hives and Rash    Has patient had a PCN reaction causing immediate rash, facial/tongue/throat swelling, SOB or lightheadedness with  hypotension: No Has patient had a PCN reaction causing severe rash involving mucus membranes or skin necrosis: Fayrene Fearing Has patient had a PCN reaction that required hospitalization No Has patient had a PCN reaction occurring within the last 10 years: No If all of the above answers are "NO", then may proceed with Cephalosporin use.    Sulfonamide Derivatives Hives and Rash    Medications Reviewed Today     Reviewed by Tomasa Blase, [facility] (Pharmacist) on 01/18/22 at 1431  Med List Status: <None>   Medication Order Taking? Sig Documenting Provider Last Dose Status Informant  aspirin EC 81 MG tablet [id number] Yes Take 1 tablet (81 mg total) by mouth daily. Janith Lima, MD Taking Active Self  atorvastatin (LIPITOR) 20 MG tablet [id number] Yes TAKE 1 TABLET ONCE DAILY. Binnie Rail, MD Taking Active Self  B Complex Vitamins (VITAMIN B COMPLEX PO) [id number] Yes Take 1 tablet by mouth daily. [provider] Taking Active Self  dicyclomine (BENTYL) 10 MG capsule [id number] Yes Take 1 capsule (10 mg total) by mouth 3 (three) times daily before meals.  Patient taking differently: Take 10 mg by mouth 3 (three) times daily as needed.   Ladene Artist, MD Taking Active   fluticasone Asencion Islam) 50 MCG/ACT nasal spray [id number] Yes Place 1 spray into both nostrils as needed for allergies or rhinitis. [provider] Taking Active Self  meloxicam (MOBIC) 15 MG tablet [id number] No Take 1 tablet (15 mg total) by mouth daily as needed for pain.  Patient not taking: Reported on 01/18/2022   Patrecia Pour, MD Not Taking Active   metoprolol succinate (TOPROL-XL) 25 MG 24 hr tablet [id number] Yes Take 1.5 tablets (37.5 mg total)  by mouth daily. Binnie Rail, MD Taking Active   Multiple Vitamins-Minerals [facility] SKIN AND NAILS FORMULA) TABS [id number] Yes Take 1 tablet by mouth daily. [provider] Taking Active Self  pantoprazole (PROTONIX) 40 MG tablet [id number] Yes TAKE 1 TABLET  ONCE DAILY. Ladene Artist, MD Taking Active   Probiotic Product (PROBIOTIC ADVANCED PO) [id number]  Take 1 capsule by mouth daily. [provider]  Active Self  Propylene Glycol (SYSTANE BALANCE OP) [id number] Yes Place 1 drop into both eyes daily as needed (dry eyes). [provider] Taking Active Self  ramipril (ALTACE) 10 MG capsule [id number] Yes Take 1 capsule (10 mg total) by mouth daily. Binnie Rail, MD Taking Active   simethicone [facility]) 80 MG chewable tablet [id number] Yes Chew 80 mg by mouth every 6 (six) hours as needed for flatulence. [provider] Taking Active Self  spironolactone (ALDACTONE) 25 MG tablet [id number] Yes Take 0.5 tablets (12.5 mg total) by mouth daily. Binnie Rail, MD Taking Active   VITAMIN D, CHOLECALCIFEROL, PO [id number] Yes Take 5,000 Units by mouth daily. [provider] Taking Active Self            Patient Active Problem List   Diagnosis Date Noted   GERD (gastroesophageal reflux disease) 03/21/2021   Dizziness 03/13/2021   Hyponatremia 03/13/2021   Callus 01/31/2021   Hav (hallux abducto valgus), unspecified laterality 01/31/2021   Plantar flexed metatarsal 01/31/2021   PTTD (posterior tibial tendon dysfunction) 01/31/2021   Cellulitis of right lower extremity 10/25/2020   Acute right lumbar radiculopathy 08/16/2020   Diabetes mellitus without complication ([place]) 75/64/3329   Iron deficiency anemia 07/01/2016   Chronic lumbar radiculopathy 06/17/2016   Poor balance 05/24/2016   At high risk for falls 05/24/2016   Gait abnormality 04/27/2016   Lumbosacral spondylosis without myelopathy 01/19/2016   S/P excision of acoustic neuroma 01/19/2016   Abdominal gas pain 01/19/2016   B12 deficiency 10/17/2015   Pernicious anemia 06/09/2015   PVC (premature ventricular contraction) 02/25/2015   Osteoporosis 01/12/2015   Vitamin D deficiency 01/12/2015   Nonspecific abnormal electrocardiogram (ECG) (EKG)  10/16/2014   URI (upper respiratory infection) 06/12/2014   Left knee pain 12/18/2011   DIVERTICULITIS OF COLON 10/11/2009   Dyslipidemia 10/21/2008   Essential hypertension 10/21/2008    Immunization History  Administered Date(s) Administered   Fluad Quad(high Dose 65+) 06/06/2019, 05/28/2020, 04/27/2021   Influenza Split 07/04/2011, 09/15/2011   Influenza Whole 05/14/2008, 06/10/2010   Influenza, High Dose Seasonal PF 06/18/2013, 05/02/2016, 05/25/2017, 07/04/2018   Influenza,inj,Quad PF,6+ Mos 09/23/2014, 06/09/2015   PFIZER Comirnaty(Gray Top)Covid-19 Tri-Sucrose Vaccine 03/15/2021   PFIZER(Purple Top)SARS-COV-2 Vaccination 08/27/2019, 09/16/2019, 05/18/2020   Pneumococcal Conjugate-13 10/16/2014   Pneumococcal Polysaccharide-23 10/21/2008, 10/14/2015   Td 11/13/2006   Tdap 05/25/2017   Zoster, Live 12/28/2006    Conditions to be addressed/monitored:  Hypertension, Hyperlipidemia, Diabetes, GERD, Osteopenia and Osteoarthritis  Care Plan : [place]  Updates made by Tomasa Blase, [place] since 01/18/2022 12:00 AM     Problem: Hypertension, Hyperlipidemia, Diabetes, GERD, Osteopenia and Osteoarthritis   Priority: High     Long-Range Goal: Disease management   Start Date: 11/22/2020  Expected End Date: 01/19/2023  This Visit's Progress: On track  Recent Progress: On track  Priority: High  Note:   Current Barriers:  Unable to independently monitor therapeutic efficacy  Pharmacist Clinical Goal(s):  Patient will achieve adherence to monitoring guidelines and medication adherence to achieve therapeutic efficacy through collaboration with  PharmD and provider.   Interventions: 1:1 collaboration with Binnie Rail, MD regarding development and update of comprehensive plan of care as evidenced by provider attestation and co-signature Inter-disciplinary care team collaboration (see longitudinal plan of care) Comprehensive medication review performed; medication  list updated in electronic medical record  Hypertension (BP goal <140/90) -Controlled - recent office BP at goal BP Readings from Last 3 Encounters:  10/28/21 140/78  05/10/21 128/70  04/27/21 130/76  -Current treatment: Ramipril 10 mg daily AM Metoprolol succinate 37.5mg  daily AM  -Medications previously tried: n/a -Current home readings: 120-130's/65-70's -Denies hypotensive/hypertensive symptoms -Educated on BP goals and benefits of medications for prevention of heart attack, stroke and kidney damage; Proper BP monitoring technique; -Counseled to monitor BP at home daily, document, and provide log at future appointments -Recommended to continue current medication  Hyperlipidemia: (LDL goal < 100) -Controlled - LDL is at goal, pt denies side effects Lab Results  Component Value Date   LDLCALC 80 12/20/2020  -Current treatment: Atorvastatin 20 mg daily Aspirin 81 mg daily -Current exercise habits: going upstairs  -Educated on Cholesterol goals;  Benefits of statin for ASCVD risk reduction; -Recommended to continue current medication  Diabetes (A1c goal <7%) -Diet-controlled Lab Results  Component Value Date   HGBA1C 6.4 01/02/2022  -Denies hypoglycemic/hyperglycemic symptoms -Educated on A1c and blood sugar goals; -Counseled to check feet daily and get yearly eye exams -Counseled on diet and exercise extensively  Osteopenia (Goal: prevent fractures) -Controlled - PCP and pt reviewed recent dexa scan, plan to continue current treatment -Last DEXA Scan: 11/04/2021  T-Score left femoral neck: -1.4             T-Score right femoral neck: -1.4  T-Score lumbar spine: +2.0  10-year probability of major osteoporotic fracture: 18%  10-year probability of hip fracture: 4.3% -Patient is a candidate for pharmacologic treatment due to T-Score -1.0 to -2.5 and 10-year risk of hip fracture > 3% -Current treatment  Vitamin D 5000 IU QOD -Medications previously tried:  none -Recommend [phone] units of vitamin D daily. Recommend 1200 mg of calcium daily from dietary and supplemental sources. Recommend weight-bearing and muscle strengthening exercises for building and maintaining bone density.  GERD (Goal: manage symptoms) -Controlled  -hx esophageal stretching, choking issues -Current treatment  Pantoprazole 40 mg daily AM -Patient is satisfied with current regimen and denies issues -Recommended to continue current medication  Pain (Goal: manage symptoms) -Controlled - pt managed with PRN Meloxicam, Aleve -osteoarthritis, lumbar radiculopathy -Current treatment  Meloxicam 15 mg daily PRN - not currently using at this time  -Recommended to continue current medication if needed, should eGFR remain low, would advise for patient to avoid NSAID use going forward   Health Maintenance -Vaccine gaps: none -Current therapy:  Fluticasone nasal spray Gas-X Hair, skin, nails vitamin  Probiotic / Prebiotic  -Patient is satisfied with current therapy and denies issues -Recommended to continue current medication  Patient Goals/Self-Care Activities Patient will:  - take medications as prescribed focus on medication adherence by routine check blood pressure daily, document, and provide at future appointments  Follow Up Plan: Telephone follow up appointment with care management team member scheduled for: 1 year       Medication Assistance: None required.  Patient affirms current coverage meets needs.  Patient's preferred pharmacy is:  [facility], [place] [address] Phone: [phone] Fax: [phone]  Uses pill box? No -  prefers bottles Pt endorses 100% compliance  Care Plan and Follow Up Patient Decision:  Patient agrees to Care Plan and Follow-up.  Plan: Telephone follow up appointment with care management team member scheduled for:  1 year  Tomasa Blase, PharmD Clinical Pharmacist, [place]

## 2022-01-18 NOTE — Patient Instructions (Signed)
Visit Information  Following are the goals we discussed today:   Manage My Medicine   Timeframe:  Long-Range Goal Priority:  Medium Start Date:    11/19/20                         Expected End Date:     01/19/2023                  Follow Up Date 01/2023   - call for medicine refill 2 or 3 days before it runs out - call if I am sick and can't take my medicine - keep a list of all the medicines I take; vitamins and herbals too    Why is this important?   These steps will help you keep on track with your medicines.  Plan: Telephone follow up appointment with care management team member scheduled for:  8-10 monts The patient has been provided with contact information for the care management team and has been advised to call with any health related questions or concerns.   Tomasa Blase, PharmD Clinical Pharmacist, [facility]   Please call the care guide team at [phone] if you need to cancel or reschedule your appointment.   Patient verbalizes understanding of instructions and care plan provided today and agrees to view in [place]. Active MyChart status and patient understanding of how to access instructions and care plan via MyChart confirmed with patient.

## 2022-01-24 ENCOUNTER — Other Ambulatory Visit (INDEPENDENT_AMBULATORY_CARE_PROVIDER_SITE_OTHER): Payer: Medicare Other

## 2022-01-24 DIAGNOSIS — E875 Hyperkalemia: Secondary | ICD-10-CM

## 2022-01-24 LAB — BASIC METABOLIC PANEL
BUN: 20 mg/dL (ref 6–23)
CO2: 24 mEq/L (ref 19–32)
Calcium: 9.6 mg/dL (ref 8.4–10.5)
Chloride: 101 mEq/L (ref 96–112)
Creatinine, Ser: 1.03 mg/dL (ref 0.40–1.20)
GFR: 50.53 mL/min — ABNORMAL LOW (ref >60.00)
Glucose, Bld: 106 mg/dL — ABNORMAL HIGH (ref 70–99)
Potassium: 5.3 mEq/L — ABNORMAL HIGH (ref 3.5–5.1)
Sodium: 135 mEq/L (ref 135–145)

## 2022-01-31 DIAGNOSIS — M1712 Unilateral primary osteoarthritis, left knee: Secondary | ICD-10-CM | POA: Diagnosis not present

## 2022-02-07 ENCOUNTER — Encounter: Payer: Self-pay | Admitting: Internal Medicine

## 2022-02-07 NOTE — Progress Notes (Signed)
Subjective:    Patient ID: Emily Sweeney, female    DOB: January 19, 1941, 81 y.o.   MRN: [id number]      HPI Dung is here for pre-operative clearance at the request of Dr Frederik Pear for left knee arthroplasty with spinal anesthesia scheduled for TBD.  She denies any personal or family history of problems with anesthesia or bleeding/blood clot problems.    She has no concerns.  She is taking all her medication as prescribed.   She is exercising regularly.  With her daily activities she denies chest pain, palpitations, SOB and lightheadedness.      Medications and allergies reviewed with patient and updated if appropriate.  Current Outpatient Medications on File Prior to Visit  Medication Sig Dispense Refill   aspirin EC 81 MG tablet Take 1 tablet (81 mg total) by mouth daily. 90 tablet 1   atorvastatin (LIPITOR) 20 MG tablet TAKE 1 TABLET ONCE DAILY. 90 tablet 3   B Complex Vitamins (VITAMIN B COMPLEX PO) Take 1 tablet by mouth daily.     dicyclomine (BENTYL) 10 MG capsule Take 1 capsule (10 mg total) by mouth 3 (three) times daily before meals. (Patient taking differently: Take 10 mg by mouth 3 (three) times daily as needed.) 90 capsule 11   fluticasone (FLONASE) 50 MCG/ACT nasal spray Place 1 spray into both nostrils as needed for allergies or rhinitis.     Multiple Vitamins-Minerals (HAIR SKIN AND NAILS FORMULA) TABS Take 1 tablet by mouth daily.     pantoprazole (PROTONIX) 40 MG tablet TAKE 1 TABLET ONCE DAILY. 90 tablet 3   Probiotic Product (PROBIOTIC ADVANCED PO) Take 1 capsule by mouth daily.     Propylene Glycol (SYSTANE BALANCE OP) Place 1 drop into both eyes daily as needed (dry eyes).     ramipril (ALTACE) 10 MG capsule Take 1 capsule (10 mg total) by mouth daily. 90 capsule 3   simethicone (MYLICON) 80 MG chewable tablet Chew 80 mg by mouth every 6 (six) hours as needed for flatulence.     spironolactone (ALDACTONE) 25 MG tablet Take 0.5 tablets (12.5 mg total) by  mouth daily. 45 tablet 1   VITAMIN D, CHOLECALCIFEROL, PO Take 5,000 Units by mouth daily.     No current facility-administered medications on file prior to visit.    Review of Systems  Constitutional:  Negative for fever.  Eyes:  Negative for visual disturbance.  Respiratory:  Negative for cough, shortness of breath and wheezing.   Cardiovascular:  Negative for chest pain, palpitations and leg swelling.  Gastrointestinal:  Negative for abdominal pain, blood in stool, constipation, diarrhea and nausea.       Gerd controlled  Genitourinary:  Negative for dysuria and hematuria.  Skin:  Negative for rash.  Neurological:  Positive for dizziness (occ - chronic). Negative for light-headedness and headaches.  Psychiatric/Behavioral:  Negative for dysphoric mood. The patient is not nervous/anxious.        Objective:   Vitals:   02/08/22 1034  BP: 130/78  Pulse: 89  Temp: 98.4 F (36.9 C)  SpO2: 99%   Filed Weights   02/08/22 1034  Weight: 140 lb (63.5 kg)   Body mass index is 22.26 kg/m.  BP Readings from Last 3 Encounters:  02/08/22 130/78  10/28/21 140/78  05/10/21 128/70    Wt Readings from Last 3 Encounters:  02/08/22 140 lb (63.5 kg)  10/28/21 145 lb (65.8 kg)  05/10/21 141 lb (64 kg)  Physical Exam Constitutional: She appears well-developed and well-nourished. No distress.  HENT:  Head: Normocephalic and atraumatic.  Right Ear: External ear normal. Normal ear canal and TM Left Ear: External ear normal.  Normal ear canal and TM Mouth/Throat: Oropharynx is clear and moist.  Eyes: Conjunctivae normal.  Neck: Neck supple. No tracheal deviation present. No thyromegaly present.  No carotid bruit  Cardiovascular: Normal rate, regular rhythm and normal heart sounds.   No murmur heard.  No edema. Pulmonary/Chest: Effort normal and breath sounds normal. No respiratory distress. She has no wheezes. She has no rales.  Abdominal: Soft. She exhibits no distension.  There is no tenderness.  Lymphadenopathy: She has no cervical adenopathy.  Skin: Skin is warm and dry. She is not diaphoretic.  Psychiatric: She has a normal mood and affect. Her behavior is normal.     Lab Results  Component Value Date   WBC 6.5 01/02/2022   HGB 11.8 (L) 01/02/2022   HCT 36.1 01/02/2022   PLT 211.0 01/02/2022   GLUCOSE 106 (H) 01/24/2022   CHOL 178 12/20/2020   TRIG 66.0 12/20/2020   HDL 84.50 12/20/2020   LDLCALC 80 12/20/2020   ALT 10 01/02/2022   AST 17 01/02/2022   NA 135 01/24/2022   K 5.3 (H) 01/24/2022   CL 101 01/24/2022   CREATININE 1.03 01/24/2022   BUN 20 01/24/2022   CO2 24 01/24/2022   TSH 1.566 03/13/2021   INR 1.0 06/09/2015   HGBA1C 6.4 01/02/2022   MICROALBUR 1.1 01/02/2022         Assessment & Plan:        See Problem List for Assessment and Plan of chronic medical problems.

## 2022-02-07 NOTE — Patient Instructions (Addendum)
     EKG was done today.   Medications changes include :   none    Your prescription(s) have been sent to your pharmacy.

## 2022-02-08 ENCOUNTER — Ambulatory Visit (INDEPENDENT_AMBULATORY_CARE_PROVIDER_SITE_OTHER): Payer: Medicare Other | Admitting: Internal Medicine

## 2022-02-08 VITALS — BP 130/78 | HR 89 | Temp 98.4°F | Ht 66.5 in | Wt 140.0 lb

## 2022-02-08 DIAGNOSIS — E119 Type 2 diabetes mellitus without complications: Secondary | ICD-10-CM

## 2022-02-08 DIAGNOSIS — K219 Gastro-esophageal reflux disease without esophagitis: Secondary | ICD-10-CM | POA: Diagnosis not present

## 2022-02-08 DIAGNOSIS — Z01818 Encounter for other preprocedural examination: Secondary | ICD-10-CM | POA: Insufficient documentation

## 2022-02-08 DIAGNOSIS — I10 Essential (primary) hypertension: Secondary | ICD-10-CM | POA: Diagnosis not present

## 2022-02-08 DIAGNOSIS — E785 Hyperlipidemia, unspecified: Secondary | ICD-10-CM

## 2022-02-08 MED ORDER — METOPROLOL SUCCINATE ER 25 MG PO TB24: 37.5000 mg | ORAL_TABLET | Freq: Every day | ORAL | 1 refills | Status: DC

## 2022-02-08 NOTE — Assessment & Plan Note (Signed)
Chronic Lab Results  Component Value Date   HGBA1C 6.4 01/02/2022   Sugars have been well controlled with lifestyle Continue diabetic diet, regular exercise

## 2022-02-08 NOTE — Assessment & Plan Note (Signed)
Chronic Blood pressure well controlled Continue metoprolol XL 37.5 mg daily, ramipril 10 mg daily, spironolactone 12.5 mg daily

## 2022-02-08 NOTE — Assessment & Plan Note (Signed)
Chronic GERD controlled Continue pantoprazole 40 mg daily 

## 2022-02-08 NOTE — Assessment & Plan Note (Signed)
Chronic Regular exercise and healthy diet encouraged Continue atorvastatin 20 mg daily

## 2022-02-08 NOTE — Assessment & Plan Note (Signed)
Here for preop evaluation at the request of Dr. Mayer Camel for clearance for upcoming surgery of left knee arthroplasty with spinal anesthesia-most likely will be done at the end of August Chronic medical problems are stable and well-controlled including hypertension, hyperlipidemia, GERD and diabetes She is exercising regularly and has no symptoms consistent with angina or respiratory disease Blood work in May and June reviewed and she will have preop blood work done through orthopedics EKG today-sinus rhythm with first-degree AV block at 77 bpm, otherwise normal EKG.  Compared to EKGs from 2022 sinus tachycardia is no longer present.  No other changes noted  No further evaluation necessary Low risk for low risk procedure

## 2022-02-10 DIAGNOSIS — M810 Age-related osteoporosis without current pathological fracture: Secondary | ICD-10-CM

## 2022-02-10 DIAGNOSIS — I10 Essential (primary) hypertension: Secondary | ICD-10-CM

## 2022-02-10 DIAGNOSIS — E785 Hyperlipidemia, unspecified: Secondary | ICD-10-CM

## 2022-02-23 ENCOUNTER — Ambulatory Visit: Payer: Medicare Other

## 2022-02-24 ENCOUNTER — Ambulatory Visit (INDEPENDENT_AMBULATORY_CARE_PROVIDER_SITE_OTHER): Payer: Medicare Other

## 2022-02-24 DIAGNOSIS — Z0000 Encounter for general adult medical examination without abnormal findings: Secondary | ICD-10-CM

## 2022-02-24 DIAGNOSIS — R262 Difficulty in walking, not elsewhere classified: Secondary | ICD-10-CM | POA: Diagnosis not present

## 2022-02-24 DIAGNOSIS — M1732 Unilateral post-traumatic osteoarthritis, left knee: Secondary | ICD-10-CM | POA: Diagnosis not present

## 2022-02-24 DIAGNOSIS — M25662 Stiffness of left knee, not elsewhere classified: Secondary | ICD-10-CM | POA: Diagnosis not present

## 2022-02-24 NOTE — Progress Notes (Signed)
I connected with [name] today by telephone and verified that I am speaking with the correct person using two identifiers. Location patient: home Location provider: work Persons participating in the virtual visit: patient, provider.   I discussed the limitations, risks, security and privacy concerns of performing an evaluation and management service by telephone and the availability of in person appointments. I also discussed with the patient that there may be a patient responsible charge related to this service. The patient expressed understanding and verbally consented to this telephonic visit.    Interactive audio and video telecommunications were attempted between this provider and patient, however failed, due to patient having technical difficulties OR patient did not have access to video capability.  We continued and completed visit with audio only.  Some vital signs may be absent or patient reported.   Time Spent with patient on telephone encounter: 30 minutes  Subjective:   Emily Sweeney is a 81 y.o. female who presents for Medicare Annual (Subsequent) preventive examination.  Review of Systems     Cardiac Risk Factors include: advanced age (>18mn, >>38women);dyslipidemia;family history of premature cardiovascular disease;hypertension     Objective:    There were no vitals filed for this visit. There is no height or weight on file to calculate BMI.     02/24/2022    4:05 PM 02/21/2021    8:17 AM 12/17/2019    9:04 AM 12/16/2018    8:41 AM 06/13/2018    2:01 PM 12/14/2017   10:36 AM 12/07/2016   10:46 AM  Advanced Directives  Does Patient Have a Medical Advance Directive? Yes No No Yes Yes No Yes  Type of Advance Directive Living will;Healthcare Power of Attorney   Living will Living will  [place] will  Does patient want to make changes to medical advance directive? No - Patient declined No - Patient declined       Copy of HBryantin Chart? No - copy requested      No - copy requested  Would patient like information on creating a medical advance directive?   No - Patient declined   Yes (ED - Information included in AVS)     Current Medications (verified) Outpatient Encounter Medications as of 02/24/2022  Medication Sig   aspirin EC 81 MG tablet Take 1 tablet (81 mg total) by mouth daily.   atorvastatin (LIPITOR) 20 MG tablet TAKE 1 TABLET ONCE DAILY.   B Complex Vitamins (VITAMIN B COMPLEX PO) Take 1 tablet by mouth daily.   dicyclomine (BENTYL) 10 MG capsule Take 1 capsule (10 mg total) by mouth 3 (three) times daily before meals. (Patient taking differently: Take 10 mg by mouth 3 (three) times daily as needed.)   fluticasone (FLONASE) 50 MCG/ACT nasal spray Place 1 spray into both nostrils as needed for allergies or rhinitis.   metoprolol succinate (TOPROL-XL) 25 MG 24 hr tablet Take 1.5 tablets (37.5 mg total) by mouth daily.   Multiple Vitamins-Minerals (HAIR SKIN AND NAILS FORMULA) TABS Take 1 tablet by mouth daily.   pantoprazole (PROTONIX) 40 MG tablet TAKE 1 TABLET ONCE DAILY.   Probiotic Product (PROBIOTIC ADVANCED PO) Take 1 capsule by mouth daily.   Propylene Glycol (SYSTANE BALANCE OP) Place 1 drop into both eyes daily as needed (dry eyes).   ramipril (ALTACE) 10 MG capsule Take 1 capsule (10 mg total) by mouth daily.   simethicone (MYLICON) 80 MG chewable tablet Chew 80 mg by mouth every  6 (six) hours as needed for flatulence.   spironolactone (ALDACTONE) 25 MG tablet Take 0.5 tablets (12.5 mg total) by mouth daily.   VITAMIN D, CHOLECALCIFEROL, PO Take 5,000 Units by mouth daily.   No facility-administered encounter medications on file as of 02/24/2022.    Allergies (verified) Macrobid [nitrofurantoin macrocrystal], Norvasc [amlodipine besylate], Ceftin [cefuroxime axetil], Ciprofloxacin, Penicillins, and Sulfonamide derivatives   History: Past Medical History:  Diagnosis Date   Anemia     Arthritis of left knee 2022   Blood transfusion without reported diagnosis    Diverticulosis of colon    GERD (gastroesophageal reflux disease)    Hearing loss    right ear, and tinnitus   Hiatal hernia    Hyperlipidemia    Hypertension    Hyponatremia 2022   Tubular adenoma 03/05/2015   Polyp, and Benign lymphoid polyp.   UTI (urinary tract infection)    Past Surgical History:  Procedure Laterality Date   APPENDECTOMY     BREAST BIOPSY     benign   CATARACT EXTRACTION, BILATERAL  2019, 2020   COCHLEAR IMPLANT     CRANIECTOMY FOR EXCISION OF ACOUSTIC NEUROMA  2009   NASAL SEPTUM SURGERY     TONSILLECTOMY     UPPER GASTROINTESTINAL ENDOSCOPY  06/02/2020   Family History  Problem Relation Age of Onset   Dementia Mother 48   Sudden death Father 34       MI   Coronary artery disease Father    Heart disease Father    Alcohol abuse Brother    Breast cancer Daughter    Colon cancer Neg Hx    Esophageal cancer Neg Hx    Social History   Socioeconomic History   Marital status: Divorced    Spouse name: Not on file   Number of children: Not on file   Years of education: Not on file   Highest education level: Not on file  Occupational History   Occupation: psychologist  Tobacco Use   Smoking status: Former    Types: Cigarettes    Quit date: 07/04/1991    Years since quitting: 30.6   Smokeless tobacco: Never  Vaping Use   Vaping Use: Never used  Substance and Sexual Activity   Alcohol use: Yes    Alcohol/week: 0.0 standard drinks of alcohol    Comment: glass wine 4-5 days a week   Drug use: No   Sexual activity: Not Currently  Other Topics Concern   Not on file  Social History Narrative   Not on file   Social Determinants of Health   Financial Resource Strain: Low Risk  (02/24/2022)   Overall Financial Resource Strain (CARDIA)    Difficulty of Paying Living Expenses: Not hard at all  Food Insecurity: No Food Insecurity (02/24/2022)   Hunger Vital Sign     Worried About Running Out of Food in the Last Year: Never true    Ran Out of Food in the Last Year: Never true  Transportation Needs: No Transportation Needs (02/24/2022)   PRAPARE - Hydrologist (Medical): No    Lack of Transportation (Non-Medical): No  Physical Activity: Sufficiently Active (02/24/2022)   Exercise Vital Sign    Days of Exercise per Week: 5 days    Minutes of Exercise per Session: 30 min  Stress: No Stress Concern Present (02/24/2022)   [place]    Feeling of Stress : Not at all  Social Connections: Socially Isolated (02/24/2022)   Social Connection and Isolation Panel [NHANES]    Frequency of Communication with Friends and Family: More than three times a week    Frequency of Social Gatherings with Friends and Family: Three times a week    Attends Religious Services: Never    Active Member of Clubs or Organizations: No    Attends Archivist Meetings: Never    Marital Status: Divorced    Tobacco Counseling Counseling given: Not Answered   Clinical Intake:  Pre-visit preparation completed: Yes  Pain : 0-10 Pain Type: Chronic pain Pain Location: Knee Pain Orientation: Left Pain Descriptors / Indicators: Discomfort Pain Onset: More than a month ago Effect of Pain on Daily Activities: Pain can diminish job performance, lower motivation to exercise, and prevent you from completing daily tasks. Pain produces disability and affects the quality of life.     BMI - recorded: 22.26 Nutritional Status: BMI of 19-24  Normal Nutritional Risks: None Diabetes: No  How often do you need to have someone help you when you read instructions, pamphlets, or other written materials from your doctor or pharmacy?: 1 - Never What is the last grade level you completed in school?: Master's Degree in Psychology  Diabetic? no  Interpreter Needed?: No  Information entered by ::  Lisette Abu, LPN.   Activities of Daily Living    02/24/2022    4:21 PM  In your present state of health, do you have any difficulty performing the following activities:  Hearing? 1  Vision? 0  Difficulty concentrating or making decisions? 0  Walking or climbing stairs? 0  Dressing or bathing? 0  Doing errands, shopping? 0  Preparing Food and eating ? N  Using the Toilet? N  In the past six months, have you accidently leaked urine? N  Do you have problems with loss of bowel control? N  Managing your Medications? N  Managing your Finances? N  Housekeeping or managing your Housekeeping? N    Patient Care Team: Binnie Rail, MD as PCP - General (Internal Medicine) Katy Apo, MD (Ophthalmology) Sanjuana Kava, MD as Referring Physician (Otolaryngology) Tat, Eustace Quail, DO as Consulting Physician (Neurology) Charlton Haws, [facility] as Pharmacist (Pharmacist)  Indicate any recent Medical Services you may have received from other than [facility] providers in the past year (date may be approximate).     Assessment:   This is a routine wellness examination for [place].  Hearing/Vision screen Hearing Screening - Comments:: Patient wears hearing aids. Deaf in right ear. Vision Screening - Comments:: Patient wears readers for small print. Eye exam done by: Katy Apo, MD.  Dietary issues and exercise activities discussed: Current Exercise Habits: Home exercise routine, Type of exercise: walking, Time (Minutes): 30, Frequency (Times/Week): 5, Weekly Exercise (Minutes/Week): 150, Intensity: Mild, Exercise limited by: orthopedic condition(s)   Goals Addressed             This Visit's Progress    My goalis to have left knee surgery in August and start Physical Therapy.        Depression Screen    02/24/2022    4:07 PM 02/08/2022   10:33 AM 02/21/2021    8:40 AM 12/17/2019    9:05 AM 12/16/2018    8:42 AM 12/14/2017   10:40 AM 12/07/2016   10:49 AM  PHQ 2/9 Scores   PHQ - 2 Score 0 0 0 0 0 0 0  PHQ- 9 Score  1  0 0    Fall Risk    02/24/2022    4:07 PM 02/08/2022   10:33 AM 02/21/2021    8:42 AM 12/17/2019    9:04 AM 12/16/2018    8:42 AM  Fall Risk   Falls in the past year? 1 Exclusion - non ambulatory 0 0 1  Number falls in past yr: 0  0 0 0  Injury with Fall? 0  0 0 1  Risk for fall due to : No Fall Risks  No Fall Risks No Fall Risks Impaired balance/gait  Follow up Falls evaluation completed  Falls evaluation completed Falls evaluation completed;Education provided     FALL RISK PREVENTION PERTAINING TO THE HOME:  Any stairs in or around the home? Yes  If so, are there any without handrails? No  Home free of loose throw rugs in walkways, pet beds, electrical cords, etc? Yes  Adequate lighting in your home to reduce risk of falls? Yes   ASSISTIVE DEVICES UTILIZED TO PREVENT FALLS:  Life alert? No  Use of a cane, walker or w/c? No  Grab bars in the bathroom? Yes  Shower chair or bench in shower? No  Elevated toilet seat or a handicapped toilet? Yes   TIMED UP AND GO:  Was the test performed? No .  Length of time to ambulate 10 feet: n/a sec.   Appearance of gait: Gait not evaluated during this visit.  Cognitive Function:    12/14/2017   10:43 AM  MMSE - Mini Mental State Exam  Not completed: Refused        02/24/2022    4:21 PM  6CIT Screen  What Year? 0 points  What month? 0 points  What time? 0 points  Count back from 20 0 points  Months in reverse 0 points  Repeat phrase 0 points  Total Score 0 points    Immunizations Immunization History  Administered Date(s) Administered   Fluad Quad(high Dose 65+) 06/06/2019, 05/28/2020, 04/27/2021   Influenza Split 07/04/2011, 09/15/2011   Influenza Whole 05/14/2008, 06/10/2010   Influenza, High Dose Seasonal PF 06/18/2013, 05/02/2016, 05/25/2017, 07/04/2018   Influenza,inj,Quad PF,6+ Mos 09/23/2014, 06/09/2015   PFIZER Comirnaty(Gray Top)Covid-19 Tri-Sucrose Vaccine  03/15/2021   PFIZER(Purple Top)SARS-COV-2 Vaccination 08/27/2019, 09/16/2019, 05/18/2020   Pneumococcal Conjugate-13 10/16/2014   Pneumococcal Polysaccharide-23 10/21/2008, 10/14/2015   Td 11/13/2006   Tdap 05/25/2017   Zoster, Live 12/28/2006    TDAP status: Up to date  Flu Vaccine status: Up to date  Pneumococcal vaccine status: Up to date  Covid-19 vaccine status: Completed vaccines  Qualifies for Shingles Vaccine? Yes   Zostavax completed Yes   Shingrix Completed?: No.    Education has been provided regarding the importance of this vaccine. Patient has been advised to call insurance company to determine out of pocket expense if they have not yet received this vaccine. Advised may also receive vaccine at local pharmacy or Health Dept. Verbalized acceptance and understanding.  Screening Tests Health Maintenance  Topic Date Due   OPHTHALMOLOGY EXAM  06/01/2019   COVID-19 Vaccine (5 - Booster for Pfizer series) 02/24/2022 (Originally 05/10/2021)   Zoster Vaccines- Shingrix (1 of 2) 05/11/2022 (Originally 04/06/1960)   INFLUENZA VACCINE  03/14/2022   HEMOGLOBIN A1C  07/05/2022   FOOT EXAM  08/12/2022   Diabetic kidney evaluation - Urine ACR  01/03/2023   Diabetic kidney evaluation - GFR measurement  01/25/2023   DEXA SCAN  11/05/2023   TETANUS/TDAP  05/26/2027   Pneumonia Vaccine 4+ Years old  Completed   HPV VACCINES  Aged Out   COLONOSCOPY (Pts 45-32yr Insurance coverage will need to be confirmed)  Discontinued    Health Maintenance  Health Maintenance Due  Topic Date Due   OPHTHALMOLOGY EXAM  06/01/2019    Colorectal cancer screening: No longer required.   Mammogram status: No longer required due to patient denial.  Bone Density status: Completed 11/04/2021. Results reflect: Bone density results: OSTEOPENIA. Repeat every 2 years.  Lung Cancer Screening: (Low Dose CT Chest recommended if Age 83-80years, 30 pack-year currently smoking OR have quit w/in 15years.)  does not qualify.   Lung Cancer Screening Referral: no  Additional Screening:  Hepatitis C Screening: does not qualify; Completed no  Vision Screening: Recommended annual ophthalmology exams for early detection of glaucoma and other disorders of the eye. Is the patient up to date with their annual eye exam?  Yes  Who is the provider or what is the name of the office in which the patient attends annual eye exams? GKaty Apo MD. If pt is not established with a provider, would they like to be referred to a provider to establish care? No .   Dental Screening: Recommended annual dental exams for proper oral hygiene  Community Resource Referral / Chronic Care Management: CRR required this visit?  No   CCM required this visit?  No      Plan:     I have personally reviewed and noted the following in the patient's chart:   Medical and social history Use of alcohol, tobacco or illicit drugs  Current medications and supplements including opioid prescriptions.  Functional ability and status Nutritional status Physical activity Advanced directives List of other physicians Hospitalizations, surgeries, and ER visits in previous 12 months Vitals Screenings to include cognitive, depression, and falls Referrals and appointments  In addition, I have reviewed and discussed with patient certain preventive protocols, quality metrics, and best practice recommendations. A written personalized care plan for preventive services as well as general preventive health recommendations were provided to patient.     SSheral Flow LPN   [id number]  Nurse Notes:  Please schedule your next Medicare Wellness Visit with your Nurse Health Advisor in 1 year by calling [phone]

## 2022-02-24 NOTE — Patient Instructions (Signed)
[name] , Thank you for taking time to come for your Medicare Wellness Visit. I appreciate your ongoing commitment to your health goals. Please review the following plan we discussed and let me know if I can assist you in the future.   Screening recommendations/referrals: Colonoscopy: Discontinued Mammogram: Discontinued  Bone Density: 11/04/2021; due every 2 years Recommended yearly ophthalmology/optometry visit for glaucoma screening and checkup Recommended yearly dental visit for hygiene and checkup  Vaccinations: Influenza vaccine: 04/27/2021 Pneumococcal vaccine: 10/16/2014, 10/14/2015 Tdap vaccine: 05/25/2017; due every 10 years Shingles vaccine: postponed   Covid-19: 08/27/2019, 09/16/2019, 05/18/2020, 03/15/2021  Advanced directives: Yes; Please bring a copy of your health care power of attorney and living will to the office at your convenience.  Conditions/risks identified: Yes  Next appointment: Please schedule your next Medicare Wellness Visit with your Nurse Health Advisor in 1 year by calling [phone].   Preventive Care 3 Years and Older, Female Preventive care refers to lifestyle choices and visits with your health care provider that can promote health and wellness. What does preventive care include? A yearly physical exam. This is also called an annual well check. Dental exams once or twice a year. Routine eye exams. Ask your health care provider how often you should have your eyes checked. Personal lifestyle choices, including: Daily care of your teeth and gums. Regular physical activity. Eating a healthy diet. Avoiding tobacco and drug use. Limiting alcohol use. Practicing safe sex. Taking low-dose aspirin every day. Taking vitamin and mineral supplements as recommended by your health care provider. What happens during an annual well check? The services and screenings done by your health care provider during your annual well check will depend on your age, overall  health, lifestyle risk factors, and family history of disease. Counseling  Your health care provider may ask you questions about your: Alcohol use. Tobacco use. Drug use. Emotional well-being. Home and relationship well-being. Sexual activity. Eating habits. History of falls. Memory and ability to understand (cognition). Work and work Statistician. Reproductive health. Screening  You may have the following tests or measurements: Height, weight, and BMI. Blood pressure. Lipid and cholesterol levels. These may be checked every 5 years, or more frequently if you are over 3 years old. Skin check. Lung cancer screening. You may have this screening every year starting at age 88 if you have a 30-pack-year history of smoking and currently smoke or have quit within the past 15 years. Fecal occult blood test (FOBT) of the stool. You may have this test every year starting at age 21. Flexible sigmoidoscopy or colonoscopy. You may have a sigmoidoscopy every 5 years or a colonoscopy every 10 years starting at age 23. Hepatitis C blood test. Hepatitis B blood test. Sexually transmitted disease (STD) testing. Diabetes screening. This is done by checking your blood sugar (glucose) after you have not eaten for a while (fasting). You may have this done every 1-3 years. Bone density scan. This is done to screen for osteoporosis. You may have this done starting at age 76. Mammogram. This may be done every 1-2 years. Talk to your health care provider about how often you should have regular mammograms. Talk with your health care provider about your test results, treatment options, and if necessary, the need for more tests. Vaccines  Your health care provider may recommend certain vaccines, such as: Influenza vaccine. This is recommended every year. Tetanus, diphtheria, and acellular pertussis (Tdap, Td) vaccine. You may need a Td booster every 10 years. Zoster vaccine. You  may need this after age  76. Pneumococcal 13-valent conjugate (PCV13) vaccine. One dose is recommended after age 63. Pneumococcal polysaccharide (PPSV23) vaccine. One dose is recommended after age 2. Talk to your health care provider about which screenings and vaccines you need and how often you need them. This information is not intended to replace advice given to you by your health care provider. Make sure you discuss any questions you have with your health care provider. Document Released: 08/27/2015 Document Revised: 04/19/2016 Document Reviewed: 06/01/2015 Elsevier Interactive Patient Education  2017 [place] Prevention in the Home Falls can cause injuries. They can happen to people of all ages. There are many things you can do to make your home safe and to help prevent falls. What can I do on the outside of my home? Regularly fix the edges of walkways and driveways and fix any cracks. Remove anything that might make you trip as you walk through a door, such as a raised step or threshold. Trim any bushes or trees on the path to your home. Use bright outdoor lighting. Clear any walking paths of anything that might make someone trip, such as rocks or tools. Regularly check to see if handrails are loose or broken. Make sure that both sides of any steps have handrails. Any raised decks and porches should have guardrails on the edges. Have any leaves, snow, or ice cleared regularly. Use sand or salt on walking paths during winter. Clean up any spills in your garage right away. This includes oil or grease spills. What can I do in the bathroom? Use night lights. Install grab bars by the toilet and in the tub and shower. Do not use towel bars as grab bars. Use non-skid mats or decals in the tub or shower. If you need to sit down in the shower, use a plastic, non-slip stool. Keep the floor dry. Clean up any water that spills on the floor as soon as it happens. Remove soap buildup in the tub or shower  regularly. Attach bath mats securely with double-sided non-slip rug tape. Do not have throw rugs and other things on the floor that can make you trip. What can I do in the bedroom? Use night lights. Make sure that you have a light by your bed that is easy to reach. Do not use any sheets or blankets that are too big for your bed. They should not hang down onto the floor. Have a firm chair that has side arms. You can use this for support while you get dressed. Do not have throw rugs and other things on the floor that can make you trip. What can I do in the kitchen? Clean up any spills right away. Avoid walking on wet floors. Keep items that you use a lot in easy-to-reach places. If you need to reach something above you, use a strong step stool that has a grab bar. Keep electrical cords out of the way. Do not use floor polish or wax that makes floors slippery. If you must use wax, use non-skid floor wax. Do not have throw rugs and other things on the floor that can make you trip. What can I do with my stairs? Do not leave any items on the stairs. Make sure that there are handrails on both sides of the stairs and use them. Fix handrails that are broken or loose. Make sure that handrails are as long as the stairways. Check any carpeting to make sure that it is firmly  attached to the stairs. Fix any carpet that is loose or worn. Avoid having throw rugs at the top or bottom of the stairs. If you do have throw rugs, attach them to the floor with carpet tape. Make sure that you have a light switch at the top of the stairs and the bottom of the stairs. If you do not have them, ask someone to add them for you. What else can I do to help prevent falls? Wear shoes that: Do not have high heels. Have rubber bottoms. Are comfortable and fit you well. Are closed at the toe. Do not wear sandals. If you use a stepladder: Make sure that it is fully opened. Do not climb a closed stepladder. Make sure that  both sides of the stepladder are locked into place. Ask someone to hold it for you, if possible. Clearly mark and make sure that you can see: Any grab bars or handrails. First and last steps. Where the edge of each step is. Use tools that help you move around (mobility aids) if they are needed. These include: Canes. Walkers. Scooters. Crutches. Turn on the lights when you go into a dark area. Replace any light bulbs as soon as they burn out. Set up your furniture so you have a clear path. Avoid moving your furniture around. If any of your floors are uneven, fix them. If there are any pets around you, be aware of where they are. Review your medicines with your doctor. Some medicines can make you feel dizzy. This can increase your chance of falling. Ask your doctor what other things that you can do to help prevent falls. This information is not intended to replace advice given to you by your health care provider. Make sure you discuss any questions you have with your health care provider. Document Released: 05/27/2009 Document Revised: 01/06/2016 Document Reviewed: 09/04/2014 Elsevier Interactive Patient Education  2017 [employer].

## 2022-02-27 ENCOUNTER — Other Ambulatory Visit: Payer: Self-pay | Admitting: Internal Medicine

## 2022-03-13 ENCOUNTER — Ambulatory Visit: Payer: Medicare Other | Admitting: Podiatry

## 2022-03-14 HISTORY — PX: KNEE ARTHROPLASTY: SHX992

## 2022-03-15 ENCOUNTER — Ambulatory Visit (INDEPENDENT_AMBULATORY_CARE_PROVIDER_SITE_OTHER): Payer: Medicare Other | Admitting: Podiatry

## 2022-03-15 ENCOUNTER — Encounter: Payer: Self-pay | Admitting: Podiatry

## 2022-03-15 DIAGNOSIS — M216X2 Other acquired deformities of left foot: Secondary | ICD-10-CM

## 2022-03-15 DIAGNOSIS — L84 Corns and callosities: Secondary | ICD-10-CM | POA: Diagnosis not present

## 2022-03-15 DIAGNOSIS — M2010 Hallux valgus (acquired), unspecified foot: Secondary | ICD-10-CM

## 2022-03-15 NOTE — Progress Notes (Signed)
This patient presents the office with chief complaint of painful callus on the bottom of her left foot.  She says the calluses have been painful and have limited her walking.  She was last seen in the office in 2018.  She presents the office today for an evaluation and treatment of her painful callus left foot. Patient says the insoles which were dispensed last visit have helped greatly.  Vascular  Dorsalis pedis and posterior tibial pulses are palpable  B/L.  Capillary return  WNL.  Temperature gradient is  WNL.  Skin turgor  WNL  Sensorium  Senn Weinstein monofilament wire  WNL. Normal tactile sensation.  Nail Exam  Patient has normal nails with no evidence of bacterial or fungal infection.  Orthopedic  Exam  Muscle tone and muscle strength  WNL.  No limitations of motion feet  B/L.  No crepitus or joint effusion noted.  Foot type is unremarkable and digits show no abnormalities.  Bony prominences are unremarkable. Pes planus.  Severe  HAV  B/L.  PTTD left foot.  Plantar flexed fifth metatarsal left foot.  Skin  No open lesions.  Normal skin texture and turgor.  Callus sub 1,5 left foot.  Callus left foot.  Debride callus with # 15 blade.    RTC 4 months for preventative foot care services.   Gardiner Barefoot DPM

## 2022-03-29 DIAGNOSIS — Z01812 Encounter for preprocedural laboratory examination: Secondary | ICD-10-CM | POA: Diagnosis not present

## 2022-03-29 DIAGNOSIS — Z96652 Presence of left artificial knee joint: Secondary | ICD-10-CM | POA: Diagnosis not present

## 2022-03-29 DIAGNOSIS — Z471 Aftercare following joint replacement surgery: Secondary | ICD-10-CM | POA: Diagnosis not present

## 2022-03-29 DIAGNOSIS — M25562 Pain in left knee: Secondary | ICD-10-CM | POA: Diagnosis not present

## 2022-03-29 DIAGNOSIS — M1712 Unilateral primary osteoarthritis, left knee: Secondary | ICD-10-CM | POA: Diagnosis not present

## 2022-04-07 DIAGNOSIS — M21062 Valgus deformity, not elsewhere classified, left knee: Secondary | ICD-10-CM | POA: Diagnosis not present

## 2022-04-07 DIAGNOSIS — Z96652 Presence of left artificial knee joint: Secondary | ICD-10-CM | POA: Diagnosis not present

## 2022-04-07 DIAGNOSIS — G8918 Other acute postprocedural pain: Secondary | ICD-10-CM | POA: Diagnosis not present

## 2022-04-07 DIAGNOSIS — M1712 Unilateral primary osteoarthritis, left knee: Secondary | ICD-10-CM | POA: Diagnosis not present

## 2022-04-08 DIAGNOSIS — Z471 Aftercare following joint replacement surgery: Secondary | ICD-10-CM | POA: Diagnosis not present

## 2022-04-08 DIAGNOSIS — Z96652 Presence of left artificial knee joint: Secondary | ICD-10-CM | POA: Diagnosis not present

## 2022-04-08 DIAGNOSIS — Z7982 Long term (current) use of aspirin: Secondary | ICD-10-CM | POA: Diagnosis not present

## 2022-04-08 DIAGNOSIS — S81002A Unspecified open wound, left knee, initial encounter: Secondary | ICD-10-CM | POA: Diagnosis not present

## 2022-04-10 DIAGNOSIS — Z96652 Presence of left artificial knee joint: Secondary | ICD-10-CM | POA: Diagnosis not present

## 2022-04-10 DIAGNOSIS — Z7982 Long term (current) use of aspirin: Secondary | ICD-10-CM | POA: Diagnosis not present

## 2022-04-10 DIAGNOSIS — Z471 Aftercare following joint replacement surgery: Secondary | ICD-10-CM | POA: Diagnosis not present

## 2022-04-12 ENCOUNTER — Ambulatory Visit (INDEPENDENT_AMBULATORY_CARE_PROVIDER_SITE_OTHER): Payer: Medicare Other | Admitting: Family Medicine

## 2022-04-12 ENCOUNTER — Ambulatory Visit (INDEPENDENT_AMBULATORY_CARE_PROVIDER_SITE_OTHER): Payer: Medicare Other

## 2022-04-12 ENCOUNTER — Encounter: Payer: Self-pay | Admitting: Family Medicine

## 2022-04-12 VITALS — BP 152/98 | HR 100 | Temp 97.6°F | Ht 66.5 in | Wt 146.0 lb

## 2022-04-12 DIAGNOSIS — Z96652 Presence of left artificial knee joint: Secondary | ICD-10-CM | POA: Diagnosis not present

## 2022-04-12 DIAGNOSIS — R059 Cough, unspecified: Secondary | ICD-10-CM | POA: Diagnosis not present

## 2022-04-12 DIAGNOSIS — Z7982 Long term (current) use of aspirin: Secondary | ICD-10-CM | POA: Diagnosis not present

## 2022-04-12 DIAGNOSIS — R062 Wheezing: Secondary | ICD-10-CM

## 2022-04-12 DIAGNOSIS — Z471 Aftercare following joint replacement surgery: Secondary | ICD-10-CM | POA: Diagnosis not present

## 2022-04-12 DIAGNOSIS — R058 Other specified cough: Secondary | ICD-10-CM

## 2022-04-12 DIAGNOSIS — K449 Diaphragmatic hernia without obstruction or gangrene: Secondary | ICD-10-CM | POA: Diagnosis not present

## 2022-04-12 LAB — CBC WITH DIFFERENTIAL/PLATELET
Basophils Absolute: 0.1 10*3/uL (ref 0.0–0.1)
Basophils Relative: 0.8 % (ref 0.0–3.0)
Eosinophils Absolute: 0.1 10*3/uL (ref 0.0–0.7)
Eosinophils Relative: 1.2 % (ref 0.0–5.0)
HCT: 31 % — ABNORMAL LOW (ref 36.0–46.0)
Hemoglobin: 10.2 g/dL — ABNORMAL LOW (ref 12.0–15.0)
Lymphocytes Relative: 11.7 % — ABNORMAL LOW (ref 12.0–46.0)
Lymphs Abs: 1 10*3/uL (ref 0.7–4.0)
MCHC: 33 g/dL (ref 30.0–36.0)
MCV: 89.5 fl (ref 78.0–100.0)
Monocytes Absolute: 0.9 10*3/uL (ref 0.1–1.0)
Monocytes Relative: 10.7 % (ref 3.0–12.0)
Neutro Abs: 6.7 10*3/uL (ref 1.4–7.7)
Neutrophils Relative %: 75.6 % (ref 43.0–77.0)
Platelets: 244 10*3/uL (ref 150.0–400.0)
RBC: 3.46 Mil/uL — ABNORMAL LOW (ref 3.87–5.11)
RDW: 15.4 % (ref 11.5–15.5)
WBC: 8.8 10*3/uL (ref 4.0–10.5)

## 2022-04-12 MED ORDER — METHYLPREDNISOLONE ACETATE 80 MG/ML IJ SUSP
80.0000 mg | Freq: Once | INTRAMUSCULAR | Status: AC
  Administered 2022-04-12: 80 mg via INTRAMUSCULAR

## 2022-04-12 MED ORDER — ALBUTEROL SULFATE (2.5 MG/3ML) 0.083% IN NEBU
2.5000 mg | INHALATION_SOLUTION | Freq: Once | RESPIRATORY_TRACT | Status: AC
  Administered 2022-04-12: 2.5 mg via RESPIRATORY_TRACT

## 2022-04-12 NOTE — Progress Notes (Signed)
Subjective:     Patient ID: Emily Sweeney, female    DOB: Jun 06, 1941, 81 y.o.   MRN: [id number]  Chief Complaint  Patient presents with   Wheezing    Started 8/27 Had surgery 8/25   Cough    Productive cough with yellow sputum starting 8/27    HPI Patient is in today for productive cough and wheezing   Complete left knee replacement on 04/07/2022   Wheezing started on 2 days after surgery but no cough initially. She has since developed a cough with yellow thick sputum.  She also noted a low grade fever  Denies chills, dizziness, chest pain, palpitations, shortness of breath, abdominal pain, N/V/D.     Health Maintenance Due  Topic Date Due   OPHTHALMOLOGY EXAM  06/01/2019   INFLUENZA VACCINE  03/14/2022    Past Medical History:  Diagnosis Date   Anemia    Arthritis of left knee 2022   Blood transfusion without reported diagnosis    Diverticulosis of colon    GERD (gastroesophageal reflux disease)    Hearing loss    right ear, and tinnitus   Hiatal hernia    Hyperlipidemia    Hypertension    Hyponatremia 2022   Tubular adenoma 03/05/2015   Polyp, and Benign lymphoid polyp.   UTI (urinary tract infection)     Past Surgical History:  Procedure Laterality Date   APPENDECTOMY     BREAST BIOPSY     benign   CATARACT EXTRACTION, BILATERAL  2019, 2020   COCHLEAR IMPLANT     CRANIECTOMY FOR EXCISION OF ACOUSTIC NEUROMA  2009   NASAL SEPTUM SURGERY     TONSILLECTOMY     UPPER GASTROINTESTINAL ENDOSCOPY  06/02/2020    Family History  Problem Relation Age of Onset   Dementia Mother 35   Sudden death Father 70       MI   Coronary artery disease Father    Heart disease Father    Alcohol abuse Brother    Breast cancer Daughter    Colon cancer Neg Hx    Esophageal cancer Neg Hx     Social History   Socioeconomic History   Marital status: Divorced    Spouse name: Not on file   Number of children: Not on file   Years of education: Not on file    Highest education level: Not on file  Occupational History   Occupation: psychologist  Tobacco Use   Smoking status: Former    Types: Cigarettes    Quit date: 07/04/1991    Years since quitting: 30.8   Smokeless tobacco: Never  Vaping Use   Vaping Use: Never used  Substance and Sexual Activity   Alcohol use: Yes    Alcohol/week: 0.0 standard drinks of alcohol    Comment: glass wine 4-5 days a week   Drug use: No   Sexual activity: Not Currently  Other Topics Concern   Not on file  Social History Narrative   Not on file   Social Determinants of Health   Financial Resource Strain: Low Risk  (02/24/2022)   Overall Financial Resource Strain (CARDIA)    Difficulty of Paying Living Expenses: Not hard at all  Food Insecurity: No Food Insecurity (02/24/2022)   Hunger Vital Sign    Worried About Running Out of Food in the Last Year: Never true    Ran Out of Food in the Last Year: Never true  Transportation Needs: No Transportation Needs (02/24/2022)   PRAPARE -  Hydrologist (Medical): No    Lack of Transportation (Non-Medical): No  Physical Activity: Sufficiently Active (02/24/2022)   Exercise Vital Sign    Days of Exercise per Week: 5 days    Minutes of Exercise per Session: 30 min  Stress: No Stress Concern Present (02/24/2022)   [place]    Feeling of Stress : Not at all  Social Connections: Socially Isolated (02/24/2022)   Social Connection and Isolation Panel [NHANES]    Frequency of Communication with Friends and Family: More than three times a week    Frequency of Social Gatherings with Friends and Family: Three times a week    Attends Religious Services: Never    Active Member of Clubs or Organizations: No    Attends Archivist Meetings: Never    Marital Status: Divorced  Human resources officer Violence: Not At Risk (02/24/2022)   Humiliation, Afraid, Rape, and Kick  questionnaire    Fear of Current or Ex-Partner: No    Emotionally Abused: No    Physically Abused: No    Sexually Abused: No    Outpatient Medications Prior to Visit  Medication Sig Dispense Refill   aspirin EC 81 MG tablet Take 1 tablet (81 mg total) by mouth daily. 90 tablet 1   atorvastatin (LIPITOR) 20 MG tablet TAKE 1 TABLET ONCE DAILY. (Patient taking differently: Take 20 mg by mouth daily.) 90 tablet 3   B Complex Vitamins (VITAMIN B COMPLEX PO) Take 1 tablet by mouth daily. (Patient not taking: Reported on 04/15/2022)     dicyclomine (BENTYL) 10 MG capsule Take 1 capsule (10 mg total) by mouth 3 (three) times daily before meals. (Patient taking differently: Take 10 mg by mouth daily.) 90 capsule 11   fluticasone (FLONASE) 50 MCG/ACT nasal spray Place 1 spray into both nostrils daily as needed for allergies or rhinitis.     metoprolol succinate (TOPROL-XL) 25 MG 24 hr tablet Take 1.5 tablets (37.5 mg total) by mouth daily. 135 tablet 1   Multiple Vitamins-Minerals (HAIR SKIN AND NAILS FORMULA) TABS Take 1 tablet by mouth daily.     oxyCODONE (OXY IR/ROXICODONE) 5 MG immediate release tablet Take 5 mg by mouth every 4 (four) hours as needed for severe pain.     pantoprazole (PROTONIX) 40 MG tablet TAKE 1 TABLET ONCE DAILY. (Patient taking differently: Take 40 mg by mouth daily.) 90 tablet 3   Probiotic Product (PROBIOTIC ADVANCED PO) Take 1 capsule by mouth daily.     Propylene Glycol (SYSTANE BALANCE OP) Place 1 drop into both eyes daily as needed (dry eyes).     ramipril (ALTACE) 10 MG capsule Take 1 capsule (10 mg total) by mouth daily. 90 capsule 3   simethicone (MYLICON) 80 MG chewable tablet Chew 80 mg by mouth every 6 (six) hours as needed for flatulence.     spironolactone (ALDACTONE) 25 MG tablet TAKE 1/2 TABLET BY MOUTH DAILY 45 tablet 3   tiZANidine (ZANAFLEX) 2 MG tablet Take 2 mg by mouth daily as needed for muscle spasms.     VITAMIN D, CHOLECALCIFEROL, PO Take 5,000 Units  by mouth daily. (Patient not taking: Reported on 04/15/2022)     ondansetron (ZOFRAN) 4 MG tablet Take 4 mg by mouth every 6 (six) hours as needed. (Patient not taking: Reported on 04/12/2022)     No facility-administered medications prior to visit.    Allergies  Allergen Reactions   Macrobid [  Nitrofurantoin Macrocrystal] Other (See Comments)    Fever, pain, fatigue, no appetite   Norvasc [Amlodipine Besylate]     Palpitations, low bp   Ceftin [Cefuroxime Axetil] Hives and Rash   Ciprofloxacin Rash   Penicillins Hives and Rash    Has patient had a PCN reaction causing immediate rash, facial/tongue/throat swelling, SOB or lightheadedness with hypotension: No Has patient had a PCN reaction causing severe rash involving mucus membranes or skin necrosis: Fayrene Fearing Has patient had a PCN reaction that required hospitalization No Has patient had a PCN reaction occurring within the last 10 years: No If all of the above answers are "NO", then may proceed with Cephalosporin use.    Sulfonamide Derivatives Hives and Rash    ROS     Objective:    Physical Exam Constitutional:      General: She is not in acute distress.    Appearance: She is not ill-appearing.  HENT:     Mouth/Throat:     Mouth: Mucous membranes are moist.  Eyes:     Conjunctiva/sclera: Conjunctivae normal.     Pupils: Pupils are equal, round, and reactive to light.  Cardiovascular:     Rate and Rhythm: Normal rate.  Pulmonary:     Effort: Pulmonary effort is normal.     Breath sounds: Wheezing present.     Comments: Wheezing throughout which improved slightly R>L post nebulizer treatment Musculoskeletal:     Cervical back: Normal range of motion and neck supple.  Skin:    General: Skin is warm and dry.  Neurological:     General: No focal deficit present.     Mental Status: She is alert and oriented to person, place, and time.  Psychiatric:        Mood and Affect: Mood normal.        Behavior: Behavior normal.         Thought Content: Thought content normal.     BP (!) 152/98 (BP Location: Left Arm, Patient Position: Sitting, Cuff Size: Large)   Pulse 100   Temp 97.6 F (36.4 C) (Temporal)   Ht 5' 6.5" (1.689 m)   Wt 146 lb (66.2 kg)   SpO2 95%   BMI 23.21 kg/m  Wt Readings from Last 3 Encounters:  04/15/22 144 lb (65.3 kg)  04/12/22 146 lb (66.2 kg)  02/08/22 140 lb (63.5 kg)       Assessment & Plan:   Problem List Items Addressed This Visit   None Visit Diagnoses     Bilateral wheezing    -  Primary   Relevant Medications   methylPREDNISolone acetate (DEPO-MEDROL) injection 80 mg (Completed)   albuterol (PROVENTIL) (2.5 MG/3ML) 0.083% nebulizer solution 2.5 mg (Completed)   Other Relevant Orders   CBC with Differential/Platelet (Completed)   DG Chest 2 View (Completed)   Productive cough       Relevant Medications   methylPREDNISolone acetate (DEPO-MEDROL) injection 80 mg (Completed)   Other Relevant Orders   CBC with Differential/Platelet (Completed)   DG Chest 2 View (Completed)      Significant wheezing on exam. She was asked to go downstairs for stat chest XR and stat CBC. She returned to the exam room for an albuterol nebulizer tx and Depo-Medrol injection due to wheezing and cough.  Normal WBC and negative chest XR Wheezing improved post neb tx but she continued having some wheezing (L>R).  Unclear etiology but currently afebrile, euvolemic, and vitals WNL, without signs of infection.  BP not  well controlled. Will continue to monitor.  Follow up with PCP and orthopedic surgeon as recommended or sooner if needed.    I am having [name] "Inez Catalina" maintain her aspirin EC, (VITAMIN D, CHOLECALCIFEROL, PO), fluticasone, Hair Skin and Nails Formula, simethicone, Probiotic Product (PROBIOTIC ADVANCED PO), B Complex Vitamins (VITAMIN B COMPLEX PO), Propylene Glycol (SYSTANE BALANCE OP), ramipril, dicyclomine, pantoprazole, metoprolol succinate, atorvastatin,  spironolactone, oxyCODONE, ondansetron, and tiZANidine. We administered methylPREDNISolone acetate and albuterol.  Meds ordered this encounter  Medications   methylPREDNISolone acetate (DEPO-MEDROL) injection 80 mg   albuterol (PROVENTIL) (2.5 MG/3ML) 0.083% nebulizer solution 2.5 mg

## 2022-04-12 NOTE — Patient Instructions (Signed)
Your chest x-ray does not show any sign of pneumonia and your white blood cell count is normal.  No antibiotic therapy needed at this time.  You received an albuterol nebulizer today in our office as well as Depo-Medrol 80 mg IM injection.  Please let us know if you have any new or worsening symptoms.

## 2022-04-13 ENCOUNTER — Encounter: Payer: Self-pay | Admitting: Family Medicine

## 2022-04-13 NOTE — Telephone Encounter (Signed)
Pt states this morning she has had higher BP readings than normal and is wondering if the prednisone could be the cause and if there is anything she can do?

## 2022-04-14 DIAGNOSIS — Z471 Aftercare following joint replacement surgery: Secondary | ICD-10-CM | POA: Diagnosis not present

## 2022-04-14 DIAGNOSIS — Z96652 Presence of left artificial knee joint: Secondary | ICD-10-CM | POA: Diagnosis not present

## 2022-04-14 DIAGNOSIS — Z7982 Long term (current) use of aspirin: Secondary | ICD-10-CM | POA: Diagnosis not present

## 2022-04-15 ENCOUNTER — Telehealth: Payer: Medicare Other | Admitting: Family Medicine

## 2022-04-15 ENCOUNTER — Emergency Department (HOSPITAL_COMMUNITY)
Admission: EM | Admit: 2022-04-15 | Discharge: 2022-04-15 | Disposition: A | Discharge status: Home / Self Care | Payer: Medicare Other | Attending: Emergency Medicine | Admitting: Emergency Medicine

## 2022-04-15 ENCOUNTER — Encounter (HOSPITAL_COMMUNITY): Payer: Self-pay | Admitting: Emergency Medicine

## 2022-04-15 DIAGNOSIS — I10 Essential (primary) hypertension: Secondary | ICD-10-CM

## 2022-04-15 DIAGNOSIS — Z7982 Long term (current) use of aspirin: Secondary | ICD-10-CM | POA: Diagnosis not present

## 2022-04-15 DIAGNOSIS — R5082 Postprocedural fever: Secondary | ICD-10-CM

## 2022-04-15 DIAGNOSIS — Z4801 Encounter for change or removal of surgical wound dressing: Secondary | ICD-10-CM | POA: Diagnosis not present

## 2022-04-15 DIAGNOSIS — R609 Edema, unspecified: Secondary | ICD-10-CM | POA: Diagnosis not present

## 2022-04-15 DIAGNOSIS — Z79899 Other long term (current) drug therapy: Secondary | ICD-10-CM | POA: Insufficient documentation

## 2022-04-15 DIAGNOSIS — Z5189 Encounter for other specified aftercare: Secondary | ICD-10-CM

## 2022-04-15 LAB — BASIC METABOLIC PANEL
Anion gap: 6 (ref 5–15)
BUN: 10 mg/dL (ref 8–23)
CO2: 28 mmol/L (ref 22–32)
Calcium: 8.5 mg/dL — ABNORMAL LOW (ref 8.9–10.3)
Chloride: 109 mmol/L (ref 98–111)
Creatinine, Ser: 0.83 mg/dL (ref 0.44–1.00)
GFR, Estimated: >60 mL/min (ref >60)
Glucose, Bld: 106 mg/dL — ABNORMAL HIGH (ref 70–99)
Potassium: 3.4 mmol/L — ABNORMAL LOW (ref 3.5–5.1)
Sodium: 143 mmol/L (ref 135–145)

## 2022-04-15 LAB — CBC WITH DIFFERENTIAL/PLATELET
Abs Immature Granulocytes: 0.11 10*3/uL — ABNORMAL HIGH (ref 0.00–0.07)
Basophils Absolute: 0 10*3/uL (ref 0.0–0.1)
Basophils Relative: 1 %
Eosinophils Absolute: 0.1 10*3/uL (ref 0.0–0.5)
Eosinophils Relative: 2 %
HCT: 30.1 % — ABNORMAL LOW (ref 36.0–46.0)
Hemoglobin: 9.7 g/dL — ABNORMAL LOW (ref 12.0–15.0)
Immature Granulocytes: 2 %
Lymphocytes Relative: 21 %
Lymphs Abs: 1.5 10*3/uL (ref 0.7–4.0)
MCH: 29.6 pg (ref 26.0–34.0)
MCHC: 32.2 g/dL (ref 30.0–36.0)
MCV: 91.8 fL (ref 80.0–100.0)
Monocytes Absolute: 1 10*3/uL (ref 0.1–1.0)
Monocytes Relative: 14 %
Neutro Abs: 4.3 10*3/uL (ref 1.7–7.7)
Neutrophils Relative %: 60 %
Platelets: 280 10*3/uL (ref 150–400)
RBC: 3.28 MIL/uL — ABNORMAL LOW (ref 3.87–5.11)
RDW: 15.1 % (ref 11.5–15.5)
WBC: 7 10*3/uL (ref 4.0–10.5)
nRBC: 0 % (ref 0.0–0.2)

## 2022-04-15 NOTE — Discharge Instructions (Signed)
You were seen today for a wound check and due to high blood pressure. Please follow up with your surgeon at your upcoming appointment on Tuesday. Your blood pressure normalized while in the hospital. Please follow up with your primary care provider if you continue to have issues with high blood pressure.

## 2022-04-15 NOTE — Progress Notes (Signed)
Virtual Visit Consent   Emily Sweeney, you are scheduled for a virtual visit with a [facility] provider today. Just as with appointments in the office, your consent must be obtained to participate. Your consent will be active for this visit and any virtual visit you may have with one of our providers in the next 365 days. If you have a MyChart account, a copy of this consent can be sent to you electronically.  As this is a virtual visit, video technology does not allow for your provider to perform a traditional examination. This may limit your provider's ability to fully assess your condition. If your provider identifies any concerns that need to be evaluated in person or the need to arrange testing (such as labs, EKG, etc.), we will make arrangements to do so. Although advances in technology are sophisticated, we cannot ensure that it will always work on either your end or our end. If the connection with a video visit is poor, the visit may have to be switched to a telephone visit. With either a video or telephone visit, we are not always able to ensure that we have a secure connection.  By engaging in this virtual visit, you consent to the provision of healthcare and authorize for your insurance to be billed (if applicable) for the services provided during this visit. Depending on your insurance coverage, you may receive a charge related to this service.  I need to obtain your verbal consent now. Are you willing to proceed with your visit today? Emily Sweeney has provided verbal consent on 04/15/2022 for a virtual visit (video or telephone). Dellia Nims, FNP  Date: 04/15/2022 7:45 PM  Virtual Visit via Video Note   I, Dellia Nims, connected with  Emily Sweeney  ([id number], October 16, 1940) on 04/15/22 at  7:15 PM EDT by a video-enabled telemedicine application and verified that I am speaking with the correct person using two identifiers.  Location: Patient: Virtual Visit Location Patient:  Home Provider: Virtual Visit Location Provider: Office/Clinic   I discussed the limitations of evaluation and management by telemedicine and the availability of in person appointments. The patient expressed understanding and agreed to proceed.    History of Present Illness: Emily Sweeney is a 81 y.o. who identifies as a female who was assigned female at birth, and is being seen today for fever, post knee replacement a week ago, and elevated BP 180/106.   HPI: HPI  Problems:  Patient Active Problem List   Diagnosis Date Noted   Preop examination 02/08/2022   GERD (gastroesophageal reflux disease) 03/21/2021   Dizziness 03/13/2021   Hyponatremia 03/13/2021   Callus 01/31/2021   Hav (hallux abducto valgus), unspecified laterality 01/31/2021   Plantar flexed metatarsal 01/31/2021   PTTD (posterior tibial tendon dysfunction) 01/31/2021   Acute right lumbar radiculopathy 08/16/2020   Diabetes mellitus without complication ([place]) 52/84/1324   Iron deficiency anemia 07/01/2016   Chronic lumbar radiculopathy 06/17/2016   Poor balance 05/24/2016   At high risk for falls 05/24/2016   Gait abnormality 04/27/2016   Lumbosacral spondylosis without myelopathy 01/19/2016   S/P excision of acoustic neuroma 01/19/2016   B12 deficiency 10/17/2015   Pernicious anemia 06/09/2015   PVC (premature ventricular contraction) 02/25/2015   Osteoporosis 01/12/2015   Vitamin D deficiency 01/12/2015   Left knee pain 12/18/2011   DIVERTICULITIS OF COLON 10/11/2009   Dyslipidemia 10/21/2008   Essential hypertension 10/21/2008    Allergies:  Allergies  Allergen Reactions   Macrobid [Nitrofurantoin Macrocrystal] Other (See Comments)  Fever, pain, fatigue, no appetite   Norvasc [Amlodipine Besylate]     Palpitations, low bp   Ceftin [Cefuroxime Axetil] Hives and Rash   Ciprofloxacin Rash   Penicillins Hives and Rash    Has patient had a PCN reaction causing immediate rash, facial/tongue/throat  swelling, SOB or lightheadedness with hypotension: No Has patient had a PCN reaction causing severe rash involving mucus membranes or skin necrosis: Fayrene Fearing Has patient had a PCN reaction that required hospitalization No Has patient had a PCN reaction occurring within the last 10 years: No If all of the above answers are "NO", then may proceed with Cephalosporin use.    Sulfonamide Derivatives Hives and Rash   Medications:  Current Outpatient Medications:    aspirin EC 81 MG tablet, Take 1 tablet (81 mg total) by mouth daily., Disp: 90 tablet, Rfl: 1   atorvastatin (LIPITOR) 20 MG tablet, TAKE 1 TABLET ONCE DAILY., Disp: 90 tablet, Rfl: 3   B Complex Vitamins (VITAMIN B COMPLEX PO), Take 1 tablet by mouth daily., Disp: , Rfl:    dicyclomine (BENTYL) 10 MG capsule, Take 1 capsule (10 mg total) by mouth 3 (three) times daily before meals. (Patient taking differently: Take 10 mg by mouth 3 (three) times daily as needed.), Disp: 90 capsule, Rfl: 11   fluticasone (FLONASE) 50 MCG/ACT nasal spray, Place 1 spray into both nostrils as needed for allergies or rhinitis., Disp: , Rfl:    metoprolol succinate (TOPROL-XL) 25 MG 24 hr tablet, Take 1.5 tablets (37.5 mg total) by mouth daily., Disp: 135 tablet, Rfl: 1   Multiple Vitamins-Minerals (HAIR SKIN AND NAILS FORMULA) TABS, Take 1 tablet by mouth daily., Disp: , Rfl:    ondansetron (ZOFRAN) 4 MG tablet, Take 4 mg by mouth every 6 (six) hours as needed. (Patient not taking: Reported on 04/12/2022), Disp: , Rfl:    oxyCODONE (OXY IR/ROXICODONE) 5 MG immediate release tablet, Take 5 mg by mouth every 4 (four) hours as needed., Disp: , Rfl:    pantoprazole (PROTONIX) 40 MG tablet, TAKE 1 TABLET ONCE DAILY., Disp: 90 tablet, Rfl: 3   Probiotic Product (PROBIOTIC ADVANCED PO), Take 1 capsule by mouth daily., Disp: , Rfl:    Propylene Glycol (SYSTANE BALANCE OP), Place 1 drop into both eyes daily as needed (dry eyes)., Disp: , Rfl:    ramipril (ALTACE) 10 MG  capsule, Take 1 capsule (10 mg total) by mouth daily., Disp: 90 capsule, Rfl: 3   simethicone (MYLICON) 80 MG chewable tablet, Chew 80 mg by mouth every 6 (six) hours as needed for flatulence., Disp: , Rfl:    spironolactone (ALDACTONE) 25 MG tablet, TAKE 1/2 TABLET BY MOUTH DAILY, Disp: 45 tablet, Rfl: 3   tiZANidine (ZANAFLEX) 2 MG tablet, Take 2 mg by mouth., Disp: , Rfl:    VITAMIN D, CHOLECALCIFEROL, PO, Take 5,000 Units by mouth daily., Disp: , Rfl:   Observations/Objective: Patient is well-developed, well-nourished in no acute distress.  Resting comfortably  at home.  Head is normocephalic, atraumatic.  No labored breathing.  Speech is clear and coherent with logical content.  Patient is alert and oriented at baseline.    Assessment and Plan: 1. Post-procedural fever  2. Edema, unspecified type  3. Hypertension, unspecified type  Discussed with pt and her daughter current symptoms post operative. They agree the best course of action is to have her fully evaluated at the ED. They will go there now.   Follow Up Instructions: I discussed the assessment and treatment  plan with the patient. The patient was provided an opportunity to ask questions and all were answered. The patient agreed with the plan and demonstrated an understanding of the instructions.  A copy of instructions were sent to the patient via MyChart unless otherwise noted below.     The patient was advised to call back or seek an in-person evaluation if the symptoms worsen or if the condition fails to improve as anticipated.  Time:  I spent 10 minutes with the patient via telehealth technology discussing the above problems/concerns.    Dellia Nims, FNP

## 2022-04-15 NOTE — ED Provider Notes (Signed)
[facility] DEPT Provider Note   CSN: [id number] Arrival date & time: 04/15/22  1945     History  Chief Complaint  Patient presents with   Wound Check    Emily Sweeney is a 81 y.o. female.  Patient presents to the hospital for a wound check of her left total knee arthroplasty incision and due to hypertension.  Patient states that her knee has been more painful today with some sharp pains in the left knee.  She had total knee arthroplasty on Friday, August 26.  She has been going to physical therapy this week.  She states that therapy has been going well.  She states that earlier in the week she had some mild wheezing and was given a cortisone injection through primary care office.  She states that today her blood pressure was elevated and she had the increased pain in the left knee.  She does describe warmth around the left knee and mild swelling.  HPI     Home Medications Prior to Admission medications   Medication Sig Start Date End Date Taking? Authorizing Provider  aspirin EC 81 MG tablet Take 1 tablet (81 mg total) by mouth daily. 10/16/14  Yes Janith Lima, MD  atorvastatin (LIPITOR) 20 MG tablet TAKE 1 TABLET ONCE DAILY. Patient taking differently: Take 20 mg by mouth daily. 02/28/22  Yes Burns, Claudina Lick, MD  dicyclomine (BENTYL) 10 MG capsule Take 1 capsule (10 mg total) by mouth 3 (three) times daily before meals. Patient taking differently: Take 10 mg by mouth daily. 05/10/21  Yes Ladene Artist, MD  fluticasone (FLONASE) 50 MCG/ACT nasal spray Place 1 spray into both nostrils daily as needed for allergies or rhinitis.   Yes [provider]  metoprolol succinate (TOPROL-XL) 25 MG 24 hr tablet Take 1.5 tablets (37.5 mg total) by mouth daily. 02/08/22  Yes Burns, Claudina Lick, MD  Multiple Vitamins-Minerals (HAIR SKIN AND NAILS FORMULA) TABS Take 1 tablet by mouth daily.   Yes [provider]  oxyCODONE (OXY IR/ROXICODONE) 5 MG  immediate release tablet Take 5 mg by mouth every 4 (four) hours as needed for severe pain. 03/29/22  Yes [provider]  pantoprazole (PROTONIX) 40 MG tablet TAKE 1 TABLET ONCE DAILY. Patient taking differently: Take 40 mg by mouth daily. 06/02/21  Yes Ladene Artist, MD  Propylene Glycol (SYSTANE BALANCE OP) Place 1 drop into both eyes daily as needed (dry eyes).   Yes [provider]  ramipril (ALTACE) 10 MG capsule Take 1 capsule (10 mg total) by mouth daily. 04/27/21  Yes Burns, Claudina Lick, MD  simethicone (MYLICON) 80 MG chewable tablet Chew 80 mg by mouth every 6 (six) hours as needed for flatulence.   Yes [provider]  spironolactone (ALDACTONE) 25 MG tablet TAKE 1/2 TABLET BY MOUTH DAILY 02/28/22  Yes Burns, Claudina Lick, MD  tiZANidine (ZANAFLEX) 2 MG tablet Take 2 mg by mouth daily as needed for muscle spasms. 03/29/22  Yes [provider]  B Complex Vitamins (VITAMIN B COMPLEX PO) Take 1 tablet by mouth daily. Patient not taking: Reported on 04/15/2022    [provider]  ondansetron (ZOFRAN) 4 MG tablet Take 4 mg by mouth every 6 (six) hours as needed. Patient not taking: Reported on 04/12/2022 03/29/22   [provider]  Probiotic Product (PROBIOTIC ADVANCED PO) Take 1 capsule by mouth daily.    [provider]  VITAMIN D, CHOLECALCIFEROL, PO Take 5,000 Units by  mouth daily. Patient not taking: Reported on 04/15/2022    [provider]      Allergies    Macrobid [nitrofurantoin macrocrystal], Norvasc [amlodipine besylate], Ceftin [cefuroxime axetil], Ciprofloxacin, Penicillins, and Sulfonamide derivatives    Review of Systems   Review of Systems  Constitutional:  Negative for fever.  Respiratory:  Negative for shortness of breath.   Cardiovascular:  Negative for chest pain.  Gastrointestinal:  Negative for abdominal pain, nausea and vomiting.  Musculoskeletal:  Positive for arthralgias.    Physical Exam Updated  Vital Signs BP (!) 174/88 (BP Location: Left Arm)   Pulse 72   Temp 98.2 F (36.8 C) (Oral)   Resp 16   Ht '5\' 6"'$  (1.676 m)   Wt 65.3 kg   SpO2 96%   BMI 23.24 kg/m  Physical Exam Vitals and nursing note reviewed.  Constitutional:      General: She is not in acute distress. HENT:     Head: Normocephalic and atraumatic.     Mouth/Throat:     Mouth: Mucous membranes are moist.  Eyes:     Conjunctiva/sclera: Conjunctivae normal.  Cardiovascular:     Rate and Rhythm: Normal rate and regular rhythm.     Pulses: Normal pulses.     Heart sounds: Normal heart sounds.  Pulmonary:     Effort: Pulmonary effort is normal.     Breath sounds: Normal breath sounds.  Abdominal:     Palpations: Abdomen is soft.     Tenderness: There is no abdominal tenderness.  Musculoskeletal:        General: Swelling present.     Cervical back: Normal range of motion and neck supple.     Comments: See image below for left knee.  Skin:    General: Skin is warm and dry.     Capillary Refill: Capillary refill takes less than 2 seconds.  Neurological:     Mental Status: She is alert and oriented to person, place, and time.      ED Results / Procedures / Treatments   Labs (all labs ordered are listed, but only abnormal results are displayed) Labs Reviewed  CBC WITH DIFFERENTIAL/PLATELET - Abnormal; Notable for the following components:      Result Value   RBC 3.28 (*)    Hemoglobin 9.7 (*)    HCT 30.1 (*)    Abs Immature Granulocytes 0.11 (*)    All other components within normal limits  BASIC METABOLIC PANEL - Abnormal; Notable for the following components:   Potassium 3.4 (*)    Glucose, Bld 106 (*)    Calcium 8.5 (*)    All other components within normal limits    EKG None  Radiology No results found.  Procedures Procedures    Medications Ordered in ED Medications - No data to display  ED Course/ Medical Decision Making/ A&P                           Medical Decision  Making Amount and/or Complexity of Data Reviewed Labs: ordered.   Patient presents with a chief concern of left knee wound check.  Differential includes but is not limited to normal postop knee, DVT, surgical infection, dislocation, and others  I reviewed the patient's past medical history.  Recent office visit noted for bilateral wheezing with subsequent steroid administration.  Patient received 80 mg IM Depo-Medrol.  She had a chest x-ray at same appointment which showed no signs of  pneumonia.  I ordered and reviewed labs.  Pertinent results include hemoglobin 9.7, potassium 3.4.  I requested consultation with orthopedic surgery.  Dr. Mable Fill looked at the image of the patient's knee and agreed with the assessment that this was a normal appearing postop knee.  Patient may follow-up in office next week at scheduled appointment  The patient has no calf pain.  She has no shortness of breath.  No sign of DVT at this time.  The knee is warm but not erythematous and appears to be a normal postop presentation.  I see no indication for admission at this time.  Wound was recovered with a Mepilex dressing.  Patient's blood pressure gradually decreased after arrival.  I wonder if patient's recent Depo-Medrol injection is contributing to her hypertension today.  She has no signs of renal dysfunction.  No chest pain or other signs of endorgan dysfunction.  I believe that there is no indication for further work-up at this time.  She may continue her home hypertension medication.  Patient may discharge home at this time and keep follow-up appointment on Monday.        Final Clinical Impression(s) / ED Diagnoses Final diagnoses:  Visit for wound check  Hypertension, unspecified type    Rx / DC Orders ED Discharge Orders     None         Ronny Bacon 04/15/22 2255    Elgie Congo, MD 04/15/22 2337

## 2022-04-15 NOTE — Patient Instructions (Signed)
Hypertension, Adult High blood pressure (hypertension) is when the force of blood pumping through the arteries is too strong. The arteries are the blood vessels that carry blood from the heart throughout the body. Hypertension forces the heart to work harder to pump blood and may cause arteries to become narrow or stiff. Untreated or uncontrolled hypertension can lead to a heart attack, heart failure, a stroke, kidney disease, and other problems. A blood pressure reading consists of a higher number over a lower number. Ideally, your blood pressure should be below 120/80. The first ("top") number is called the systolic pressure. It is a measure of the pressure in your arteries as your heart beats. The second ("bottom") number is called the diastolic pressure. It is a measure of the pressure in your arteries as the heart relaxes. What are the causes? The exact cause of this condition is not known. There are some conditions that result in high blood pressure. What increases the risk? Certain factors may make you more likely to develop high blood pressure. Some of these risk factors are under your control, including: Smoking. Not getting enough exercise or physical activity. Being overweight. Having too much fat, sugar, calories, or salt (sodium) in your diet. Drinking too much alcohol. Other risk factors include: Having a personal history of heart disease, diabetes, high cholesterol, or kidney disease. Stress. Having a family history of high blood pressure and high cholesterol. Having obstructive sleep apnea. Age. The risk increases with age. What are the signs or symptoms? High blood pressure may not cause symptoms. Very high blood pressure (hypertensive crisis) may cause: Headache. Fast or irregular heartbeats (palpitations). Shortness of breath. Nosebleed. Nausea and vomiting. Vision changes. Severe chest pain, dizziness, and seizures. How is this diagnosed? This condition is diagnosed by  measuring your blood pressure while you are seated, with your arm resting on a flat surface, your legs uncrossed, and your feet flat on the floor. The cuff of the blood pressure monitor will be placed directly against the skin of your upper arm at the level of your heart. Blood pressure should be measured at least twice using the same arm. Certain conditions can cause a difference in blood pressure between your right and left arms. If you have a high blood pressure reading during one visit or you have normal blood pressure with other risk factors, you may be asked to: Return on a different day to have your blood pressure checked again. Monitor your blood pressure at home for 1 week or longer. If you are diagnosed with hypertension, you may have other blood or imaging tests to help your health care provider understand your overall risk for other conditions. How is this treated? This condition is treated by making healthy lifestyle changes, such as eating healthy foods, exercising more, and reducing your alcohol intake. You may be referred for counseling on a healthy diet and physical activity. Your health care provider may prescribe medicine if lifestyle changes are not enough to get your blood pressure under control and if: Your systolic blood pressure is above 130. Your diastolic blood pressure is above 80. Your personal target blood pressure may vary depending on your medical conditions, your age, and other factors. Follow these instructions at home: Eating and drinking  Eat a diet that is high in fiber and potassium, and low in sodium, added sugar, and fat. An example of this eating plan is called the DASH diet. DASH stands for Dietary Approaches to Stop Hypertension. To eat this way: Eat   plenty of fresh fruits and vegetables. Try to fill one half of your plate at each meal with fruits and vegetables. Eat whole grains, such as whole-wheat pasta, brown rice, or whole-grain bread. Fill about one  fourth of your plate with whole grains. Eat or drink low-fat dairy products, such as skim milk or low-fat yogurt. Avoid fatty cuts of meat, processed or cured meats, and poultry with skin. Fill about one fourth of your plate with lean proteins, such as fish, chicken without skin, beans, eggs, or tofu. Avoid pre-made and processed foods. These tend to be higher in sodium, added sugar, and fat. Reduce your daily sodium intake. Many people with hypertension should eat less than 1,500 mg of sodium a day. Do not drink alcohol if: Your health care provider tells you not to drink. You are pregnant, may be pregnant, or are planning to become pregnant. If you drink alcohol: Limit how much you have to: 0-1 drink a day for women. 0-2 drinks a day for men. Know how much alcohol is in your drink. In the [place] one drink equals one 12 oz bottle of beer (355 mL), one 5 oz glass of wine (148 mL), or one 1 oz glass of hard liquor (44 mL). Lifestyle  Work with your health care provider to maintain a healthy body weight or to lose weight. Ask what an ideal weight is for you. Get at least 30 minutes of exercise that causes your heart to beat faster (aerobic exercise) most days of the week. Activities may include walking, swimming, or biking. Include exercise to strengthen your muscles (resistance exercise), such as Pilates or lifting weights, as part of your weekly exercise routine. Try to do these types of exercises for 30 minutes at least 3 days a week. Do not use any products that contain nicotine or tobacco. These products include cigarettes, chewing tobacco, and vaping devices, such as e-cigarettes. If you need help quitting, ask your health care provider. Monitor your blood pressure at home as told by your health care provider. Keep all follow-up visits. This is important. Medicines Take over-the-counter and prescription medicines only as told by your health care provider. Follow directions carefully. Blood  pressure medicines must be taken as prescribed. Do not skip doses of blood pressure medicine. Doing this puts you at risk for problems and can make the medicine less effective. Ask your health care provider about side effects or reactions to medicines that you should watch for. Contact a health care provider if you: Think you are having a reaction to a medicine you are taking. Have headaches that keep coming back (recurring). Feel dizzy. Have swelling in your ankles. Have trouble with your vision. Get help right away if you: Develop a severe headache or confusion. Have unusual weakness or numbness. Feel faint. Have severe pain in your chest or abdomen. Vomit repeatedly. Have trouble breathing. These symptoms may be an emergency. Get help right away. Call 911. Do not wait to see if the symptoms will go away. Do not drive yourself to the hospital. Summary Hypertension is when the force of blood pumping through your arteries is too strong. If this condition is not controlled, it may put you at risk for serious complications. Your personal target blood pressure may vary depending on your medical conditions, your age, and other factors. For most people, a normal blood pressure is less than 120/80. Hypertension is treated with lifestyle changes, medicines, or a combination of both. Lifestyle changes include losing weight, eating a healthy,   low-sodium diet, exercising more, and limiting alcohol. This information is not intended to replace advice given to you by your health care provider. Make sure you discuss any questions you have with your health care provider. Document Revised: 06/07/2021 Document Reviewed: 06/07/2021 Elsevier Patient Education  2023 [employer]  

## 2022-04-15 NOTE — ED Triage Notes (Addendum)
Pt is having increased pain in new joint replacement of L knee. Had wheezing Wedns but chest xray clean. Got a cortisone shot for wheezing. Covid test there was negative. Running low grade fever 100 with generalized feeling unwell. Wound site feels warm but no obvious signs of infection through bandage which is supposed to come off Tues for recheck. Has had several session of PT.

## 2022-04-16 DIAGNOSIS — Z471 Aftercare following joint replacement surgery: Secondary | ICD-10-CM | POA: Diagnosis not present

## 2022-04-16 DIAGNOSIS — Z96652 Presence of left artificial knee joint: Secondary | ICD-10-CM | POA: Diagnosis not present

## 2022-04-16 DIAGNOSIS — Z7982 Long term (current) use of aspirin: Secondary | ICD-10-CM | POA: Diagnosis not present

## 2022-04-18 DIAGNOSIS — M6281 Muscle weakness (generalized): Secondary | ICD-10-CM | POA: Diagnosis not present

## 2022-04-18 DIAGNOSIS — M25662 Stiffness of left knee, not elsewhere classified: Secondary | ICD-10-CM | POA: Diagnosis not present

## 2022-04-18 DIAGNOSIS — Z96652 Presence of left artificial knee joint: Secondary | ICD-10-CM | POA: Diagnosis not present

## 2022-04-18 DIAGNOSIS — Z471 Aftercare following joint replacement surgery: Secondary | ICD-10-CM | POA: Diagnosis not present

## 2022-04-18 NOTE — Telephone Encounter (Signed)
FYI.. patient is just wanting to keep you informed, not needing any new recommendations

## 2022-04-19 ENCOUNTER — Encounter: Payer: Self-pay | Admitting: Internal Medicine

## 2022-04-19 ENCOUNTER — Ambulatory Visit (INDEPENDENT_AMBULATORY_CARE_PROVIDER_SITE_OTHER): Payer: Medicare Other | Admitting: Internal Medicine

## 2022-04-19 DIAGNOSIS — I10 Essential (primary) hypertension: Secondary | ICD-10-CM | POA: Diagnosis not present

## 2022-04-19 NOTE — Progress Notes (Signed)
   Subjective:   Patient ID: Emily Sweeney, female    DOB: 1941/02/04, 81 y.o.   MRN: [id number]  HPI The patient is an 81 YO female coming in for high BP. Given steroid shot about 1 week ago for wheezing. Feeling better from breathing issues. Needs to start PT for knee.  Review of Systems  Constitutional: Negative.   HENT: Negative.    Eyes: Negative.   Respiratory:  Negative for cough, chest tightness and shortness of breath.   Cardiovascular:  Negative for chest pain, palpitations and leg swelling.  Gastrointestinal:  Negative for abdominal distention, abdominal pain, constipation, diarrhea, nausea and vomiting.  Musculoskeletal:  Positive for arthralgias and gait problem.  Skin: Negative.   Psychiatric/Behavioral: Negative.      Objective:  Physical Exam Constitutional:      Appearance: She is well-developed.  HENT:     Head: Normocephalic and atraumatic.  Cardiovascular:     Rate and Rhythm: Normal rate and regular rhythm.  Pulmonary:     Effort: Pulmonary effort is normal. No respiratory distress.     Breath sounds: Normal breath sounds. No wheezing or rales.  Abdominal:     General: Bowel sounds are normal. There is no distension.     Palpations: Abdomen is soft.     Tenderness: There is no abdominal tenderness. There is no rebound.  Musculoskeletal:        General: Tenderness present.     Cervical back: Normal range of motion.  Skin:    General: Skin is warm and dry.     Comments: Left knee wound healing well without infection  Neurological:     Mental Status: She is alert and oriented to person, place, and time.     Coordination: Coordination abnormal.     Vitals:   04/19/22 0838 04/19/22 0840  BP: (!) 152/84 (!) 148/86  Pulse: 89   SpO2: 98%   Weight: 147 lb (66.7 kg)   Height: '5\' 6"'$  (1.676 m)     Assessment & Plan:

## 2022-04-19 NOTE — Patient Instructions (Signed)
We will have you take 2 metoprolol on the day of physical therapy and keep all other medicines the same.   On a non-physical therapy day take the medications as normal.

## 2022-04-19 NOTE — Assessment & Plan Note (Signed)
BP elevated mildly today. Suspect related to cortisone shot and pain in knee. New note done for PT to allow her to participate. Advised to take 50 mg metoprolol morning of PT and keep ramipril 10 mg daily and spironolactone 12.5 mg daily. On non-PT days take meds as prescribed. This is for next 1-2 weeks then reassess.

## 2022-04-21 DIAGNOSIS — Z96652 Presence of left artificial knee joint: Secondary | ICD-10-CM | POA: Diagnosis not present

## 2022-04-21 DIAGNOSIS — M25662 Stiffness of left knee, not elsewhere classified: Secondary | ICD-10-CM | POA: Diagnosis not present

## 2022-04-21 DIAGNOSIS — M6281 Muscle weakness (generalized): Secondary | ICD-10-CM | POA: Diagnosis not present

## 2022-04-25 DIAGNOSIS — Z96652 Presence of left artificial knee joint: Secondary | ICD-10-CM | POA: Diagnosis not present

## 2022-04-25 DIAGNOSIS — M25662 Stiffness of left knee, not elsewhere classified: Secondary | ICD-10-CM | POA: Diagnosis not present

## 2022-04-25 DIAGNOSIS — M6281 Muscle weakness (generalized): Secondary | ICD-10-CM | POA: Diagnosis not present

## 2022-04-28 DIAGNOSIS — M25662 Stiffness of left knee, not elsewhere classified: Secondary | ICD-10-CM | POA: Diagnosis not present

## 2022-04-28 DIAGNOSIS — M6281 Muscle weakness (generalized): Secondary | ICD-10-CM | POA: Diagnosis not present

## 2022-04-28 DIAGNOSIS — Z96652 Presence of left artificial knee joint: Secondary | ICD-10-CM | POA: Diagnosis not present

## 2022-04-29 ENCOUNTER — Emergency Department (HOSPITAL_COMMUNITY): Payer: Medicare Other

## 2022-04-29 ENCOUNTER — Emergency Department (HOSPITAL_COMMUNITY)
Admission: EM | Admit: 2022-04-29 | Discharge: 2022-04-29 | Disposition: A | Discharge status: Home / Self Care | Payer: Medicare Other | Attending: Emergency Medicine | Admitting: Emergency Medicine

## 2022-04-29 ENCOUNTER — Other Ambulatory Visit: Payer: Self-pay

## 2022-04-29 DIAGNOSIS — Z79899 Other long term (current) drug therapy: Secondary | ICD-10-CM | POA: Diagnosis not present

## 2022-04-29 DIAGNOSIS — S0993XA Unspecified injury of face, initial encounter: Secondary | ICD-10-CM | POA: Diagnosis present

## 2022-04-29 DIAGNOSIS — W19XXXA Unspecified fall, initial encounter: Secondary | ICD-10-CM

## 2022-04-29 DIAGNOSIS — M79641 Pain in right hand: Secondary | ICD-10-CM | POA: Diagnosis not present

## 2022-04-29 DIAGNOSIS — W01198A Fall on same level from slipping, tripping and stumbling with subsequent striking against other object, initial encounter: Secondary | ICD-10-CM | POA: Diagnosis not present

## 2022-04-29 DIAGNOSIS — Y9301 Activity, walking, marching and hiking: Secondary | ICD-10-CM | POA: Insufficient documentation

## 2022-04-29 DIAGNOSIS — S62306A Unspecified fracture of fifth metacarpal bone, right hand, initial encounter for closed fracture: Secondary | ICD-10-CM | POA: Insufficient documentation

## 2022-04-29 DIAGNOSIS — S0181XA Laceration without foreign body of other part of head, initial encounter: Secondary | ICD-10-CM | POA: Insufficient documentation

## 2022-04-29 DIAGNOSIS — S0990XA Unspecified injury of head, initial encounter: Secondary | ICD-10-CM | POA: Diagnosis not present

## 2022-04-29 DIAGNOSIS — Y92000 Kitchen of unspecified non-institutional (private) residence as  the place of occurrence of the external cause: Secondary | ICD-10-CM | POA: Diagnosis not present

## 2022-04-29 DIAGNOSIS — R9082 White matter disease, unspecified: Secondary | ICD-10-CM | POA: Diagnosis not present

## 2022-04-29 DIAGNOSIS — I10 Essential (primary) hypertension: Secondary | ICD-10-CM | POA: Diagnosis not present

## 2022-04-29 DIAGNOSIS — Z7982 Long term (current) use of aspirin: Secondary | ICD-10-CM | POA: Insufficient documentation

## 2022-04-29 DIAGNOSIS — S62346A Nondisplaced fracture of base of fifth metacarpal bone, right hand, initial encounter for closed fracture: Secondary | ICD-10-CM | POA: Diagnosis not present

## 2022-04-29 DIAGNOSIS — M7989 Other specified soft tissue disorders: Secondary | ICD-10-CM | POA: Diagnosis not present

## 2022-04-29 DIAGNOSIS — Z9889 Other specified postprocedural states: Secondary | ICD-10-CM | POA: Diagnosis not present

## 2022-04-29 DIAGNOSIS — S199XXA Unspecified injury of neck, initial encounter: Secondary | ICD-10-CM | POA: Diagnosis not present

## 2022-04-29 MED ORDER — LIDOCAINE HCL (PF) 1 % IJ SOLN
5.0000 mL | Freq: Once | INTRAMUSCULAR | Status: DC
Start: 2022-04-29 — End: 2022-04-29
  Filled 2022-04-29: qty 30

## 2022-04-29 NOTE — ED Notes (Signed)
Patient transported to CT 

## 2022-04-29 NOTE — Progress Notes (Signed)
Orthopedic Tech Progress Note Patient Details:  Emily Sweeney June 21, 1941 [id number]  Ortho Devices Type of Ortho Device: Ulna gutter splint Ortho Device/Splint Location: rue Ortho Device/Splint Interventions: Adjustment, Application, Ordered   Post Interventions Patient Tolerated: Well Instructions Provided: Adjustment of device, Care of device, Poper ambulation with device  Emily Sweeney 04/29/2022, 1:25 PM

## 2022-04-29 NOTE — Discharge Instructions (Addendum)
Keep splint clean and dry. Follow up with ortho for recheck hand. Keep face wound clean and dry. Recheck with your PCP in 2 days, suture removal in 5 days at your PCP or UC office.

## 2022-04-29 NOTE — ED Notes (Signed)
Clean technique used to complete wound care to patient right hand/arm. Area cleaned with wound cleaner and non adherent dressing applied to skin tears, Patient tolerated well area clean dry and intact.

## 2022-04-29 NOTE — ED Triage Notes (Signed)
Patient brought in by EMS after unwitnessed fall. Patient fell in her kitchen causing bruise to outside of right hand, lower extremities and laceration to right eye, caused by glasses. She denies loosing consciousness but states she had recent knee surgery and used walker to ambulate, forgot to use walker lost balance and fell. Current c/o 5/10 eye pain.  BP 180/100 HR 90 R 18 97% O2 RA

## 2022-04-29 NOTE — ED Provider Notes (Signed)
Emily Sweeney DEPT Provider Note   CSN: [id number] Arrival date & time: 04/29/22  1014     History  Chief Complaint  Patient presents with   Lytle Michaels    Emily Sweeney is a 81 y.o. female.  81 year old female brought in by EMS after fall today.  Patient states that she was walking in her kitchen, not using her walker and she fell and hit her face on the ground.  Reports bruising with mild pain in her right hand.  States that she recently had left knee surgery, is supposed to use a walker but she forgets often.  She is not anticoagulated, denies loss of consciousness.  Last tetanus is less than 5 years ago.  Patient was unable to stand without assistance secondary to her knee surgery.  Past medical history of hyperlipidemia, hypertension, GERD no other injuries, complaints, concerns.       Home Medications Prior to Admission medications   Medication Sig Start Date End Date Taking? Authorizing Provider  aspirin EC 81 MG tablet Take 1 tablet (81 mg total) by mouth daily. 10/16/14   Janith Lima, MD  atorvastatin (LIPITOR) 20 MG tablet TAKE 1 TABLET ONCE DAILY. Patient taking differently: Take 20 mg by mouth daily. 02/28/22   Binnie Rail, MD  B Complex Vitamins (VITAMIN B COMPLEX PO) Take 1 tablet by mouth daily.    [provider]  dicyclomine (BENTYL) 10 MG capsule Take 1 capsule (10 mg total) by mouth 3 (three) times daily before meals. Patient taking differently: Take 10 mg by mouth daily. 05/10/21   Ladene Artist, MD  fluticasone (FLONASE) 50 MCG/ACT nasal spray Place 1 spray into both nostrils daily as needed for allergies or rhinitis.    [provider]  metoprolol succinate (TOPROL-XL) 25 MG 24 hr tablet Take 1.5 tablets (37.5 mg total) by mouth daily. 02/08/22   Binnie Rail, MD  Multiple Vitamins-Minerals (HAIR SKIN AND NAILS FORMULA) TABS Take 1 tablet by mouth daily.    [provider]  oxyCODONE (OXY IR/ROXICODONE)  5 MG immediate release tablet Take 5 mg by mouth every 4 (four) hours as needed for severe pain. 03/29/22   [provider]  pantoprazole (PROTONIX) 40 MG tablet TAKE 1 TABLET ONCE DAILY. Patient taking differently: Take 40 mg by mouth daily. 06/02/21   Ladene Artist, MD  Probiotic Product (PROBIOTIC ADVANCED PO) Take 1 capsule by mouth daily.    [provider]  Propylene Glycol (SYSTANE BALANCE OP) Place 1 drop into both eyes daily as needed (dry eyes).    [provider]  ramipril (ALTACE) 10 MG capsule Take 1 capsule (10 mg total) by mouth daily. 04/27/21   Binnie Rail, MD  simethicone (MYLICON) 80 MG chewable tablet Chew 80 mg by mouth every 6 (six) hours as needed for flatulence.    [provider]  spironolactone (ALDACTONE) 25 MG tablet TAKE 1/2 TABLET BY MOUTH DAILY 02/28/22   Binnie Rail, MD  tiZANidine (ZANAFLEX) 2 MG tablet Take 2 mg by mouth daily as needed for muscle spasms. 03/29/22   [provider]  VITAMIN D, CHOLECALCIFEROL, PO Take 5,000 Units by mouth daily.    [provider]      Allergies    Macrobid [nitrofurantoin macrocrystal], Norvasc [amlodipine besylate], Ceftin [cefuroxime axetil], Ciprofloxacin, Penicillins, and Sulfonamide derivatives    Review of Systems   Review of Systems Negative except as per HPI Physical Exam Updated Vital Signs BP Marland Kitchen)  151/82 (BP Location: Left Arm)   Pulse 78   Temp (!) 97.5 F (36.4 C) (Oral)   Resp 18   Ht '5\' 6"'$  (1.676 m)   Wt 64.9 kg   SpO2 99%   BMI 23.08 kg/m  Physical Exam Vitals and nursing note reviewed.  Constitutional:      General: She is not in acute distress.    Appearance: She is well-developed. She is not diaphoretic.    HENT:     Head: Normocephalic.     Nose: Nose normal.     Mouth/Throat:     Mouth: Mucous membranes are moist.  Eyes:     Extraocular Movements: Extraocular movements intact.     Pupils: Pupils are equal, round, and reactive to  light.  Cardiovascular:     Pulses: Normal pulses.  Pulmonary:     Effort: Pulmonary effort is normal.  Abdominal:     Palpations: Abdomen is soft.     Tenderness: There is no abdominal tenderness.  Musculoskeletal:        General: Swelling and tenderness present. No deformity. Normal range of motion.     Cervical back: Normal range of motion and neck supple. No tenderness or bony tenderness.     Thoracic back: No tenderness or bony tenderness.     Lumbar back: No tenderness or bony tenderness.     Comments: Mild bruising and swelling of the right hand along the fifth metacarpal No pain with logroll or range of motion of the hips.  No pain with range of motion, palpation of the knees.  Skin:    General: Skin is warm and dry.     Findings: No erythema or rash.  Neurological:     Mental Status: She is alert and oriented to person, place, and time.     Sensory: No sensory deficit.     Motor: No weakness.  Psychiatric:        Behavior: Behavior normal.     ED Results / Procedures / Treatments   Labs (all labs ordered are listed, but only abnormal results are displayed) Labs Reviewed - No data to display  EKG None  Radiology CT Head Wo Contrast  Result Date: 04/29/2022 CLINICAL DATA:  Status post fall.  Head trauma.  Neck trauma. EXAM: CT HEAD WITHOUT CONTRAST CT CERVICAL SPINE WITHOUT CONTRAST TECHNIQUE: Multidetector CT imaging of the head and cervical spine was performed following the standard protocol without intravenous contrast. Multiplanar CT image reconstructions of the cervical spine were also generated. RADIATION DOSE REDUCTION: This exam was performed according to the departmental dose-optimization program which includes automated exposure control, adjustment of the mA and/or kV according to patient size and/or use of iterative reconstruction technique. COMPARISON:  Head CT 03/13/2021.  Cervical spine CT 02/03/2017 FINDINGS: CT HEAD FINDINGS Brain: There is no evidence for  acute hemorrhage, hydrocephalus, mass lesion, or abnormal extra-axial fluid collection. No definite CT evidence for acute infarction. Diffuse loss of parenchymal volume is consistent with atrophy. Patchy low attenuation in the deep hemispheric and periventricular white matter is nonspecific, but likely reflects chronic microvascular ischemic demyelination. Vascular: No hyperdense vessel or unexpected calcification. Skull: No evidence for fracture. No worrisome lytic or sclerotic lesion. Sinuses/Orbits: The visualized paranasal sinuses and mastoid air cells are clear. postoperative changes noted right mastoid air cells. Visualized portions of the globes and intraorbital fat are unremarkable. Other: None. CT CERVICAL SPINE FINDINGS Alignment: Normal. Skull base and vertebrae: No acute fracture. No primary bone lesion or focal  pathologic process. Soft tissues and spinal canal: No prevertebral fluid or swelling. No visible canal hematoma. Disc levels: Diffuse loss of intervertebral disc height noted, most pronounced at C5-6 and C6-7. There is diffuse facet degeneration bilaterally. Upper chest: Unremarkable. Other: None. IMPRESSION: 1. No acute intracranial abnormality. Atrophy with chronic small vessel white matter ischemic disease. 2. Degenerative changes without evidence for an acute fracture or traumatic subluxation. Electronically Signed   By: Misty Stanley M.D.   On: 04/29/2022 11:59   CT Cervical Spine Wo Contrast  Result Date: 04/29/2022 CLINICAL DATA:  Status post fall.  Head trauma.  Neck trauma. EXAM: CT HEAD WITHOUT CONTRAST CT CERVICAL SPINE WITHOUT CONTRAST TECHNIQUE: Multidetector CT imaging of the head and cervical spine was performed following the standard protocol without intravenous contrast. Multiplanar CT image reconstructions of the cervical spine were also generated. RADIATION DOSE REDUCTION: This exam was performed according to the departmental dose-optimization program which includes  automated exposure control, adjustment of the mA and/or kV according to patient size and/or use of iterative reconstruction technique. COMPARISON:  Head CT 03/13/2021.  Cervical spine CT 02/03/2017 FINDINGS: CT HEAD FINDINGS Brain: There is no evidence for acute hemorrhage, hydrocephalus, mass lesion, or abnormal extra-axial fluid collection. No definite CT evidence for acute infarction. Diffuse loss of parenchymal volume is consistent with atrophy. Patchy low attenuation in the deep hemispheric and periventricular white matter is nonspecific, but likely reflects chronic microvascular ischemic demyelination. Vascular: No hyperdense vessel or unexpected calcification. Skull: No evidence for fracture. No worrisome lytic or sclerotic lesion. Sinuses/Orbits: The visualized paranasal sinuses and mastoid air cells are clear. postoperative changes noted right mastoid air cells. Visualized portions of the globes and intraorbital fat are unremarkable. Other: None. CT CERVICAL SPINE FINDINGS Alignment: Normal. Skull base and vertebrae: No acute fracture. No primary bone lesion or focal pathologic process. Soft tissues and spinal canal: No prevertebral fluid or swelling. No visible canal hematoma. Disc levels: Diffuse loss of intervertebral disc height noted, most pronounced at C5-6 and C6-7. There is diffuse facet degeneration bilaterally. Upper chest: Unremarkable. Other: None. IMPRESSION: 1. No acute intracranial abnormality. Atrophy with chronic small vessel white matter ischemic disease. 2. Degenerative changes without evidence for an acute fracture or traumatic subluxation. Electronically Signed   By: Misty Stanley M.D.   On: 04/29/2022 11:59   DG Hand Complete Right  Result Date: 04/29/2022 CLINICAL DATA:  Fall, right hand pain and bruising EXAM: RIGHT HAND - COMPLETE 3+ VIEW COMPARISON:  None Available. FINDINGS: Bones are osteopenic. Moderately severe diffuse degenerative arthropathy throughout the wrist and hand  involving all of the MCP joints as well as the interphalangeal joints of the fingers with diffuse joint space loss, sclerosis and bony spurring. Pattern is favored represent osteoarthritis. Slight lateral hand soft tissue swelling extending dorsally. On the oblique view, there is cortical irregularity of the right fifth metacarpal head suspicious for nondisplaced fracture. Correlate with point tenderness. No associated subluxation or dislocation. IMPRESSION: Moderately severe right wrist and hand diffuse degenerative arthropathy as above. Findings suspicious for a nondisplaced fracture of the right fifth metacarpal head as above. Electronically Signed   By: Jerilynn Mages.  Shick M.D.   On: 04/29/2022 11:03    Procedures .Marland KitchenLaceration Repair  Date/Time: 04/29/2022 12:29 PM  Performed by: Tacy Learn, PA-C Authorized by: Tacy Learn, PA-C   Consent:    Consent obtained:  Verbal   Consent given by:  Patient   Risks discussed:  Infection, need for additional repair, pain, poor cosmetic  result and poor wound healing   Alternatives discussed:  No treatment and delayed treatment Universal protocol:    Procedure explained and questions answered to patient or proxy's satisfaction: yes     Relevant documents present and verified: yes     Test results available: yes     Imaging studies available: yes     Required blood products, implants, devices, and special equipment available: yes     Site/side marked: yes     Immediately prior to procedure, a time out was called: yes     Patient identity confirmed:  Verbally with patient Anesthesia:    Anesthesia method:  Local infiltration   Local anesthetic:  Lidocaine 1% w/o epi Laceration details:    Location:  Face   Face location:  Forehead   Length (cm):  1   Depth (mm):  4 Pre-procedure details:    Preparation:  Patient was prepped and draped in usual sterile fashion and imaging obtained to evaluate for foreign bodies Exploration:    Hemostasis achieved  with:  Direct pressure   Imaging obtained comment:  CT   Imaging outcome: foreign body not noted     Wound exploration: wound explored through full range of motion and entire depth of wound visualized     Wound extent: no foreign bodies/material noted, no muscle damage noted and no underlying fracture noted     Contaminated: no   Treatment:    Area cleansed with:  Saline   Amount of cleaning:  Standard   Irrigation solution:  Sterile saline Skin repair:    Repair method:  Sutures   Suture size:  5-0   Suture material:  Prolene   Suture technique:  Simple interrupted   Number of sutures:  4 Approximation:    Approximation:  Close Repair type:    Repair type:  Simple Post-procedure details:    Dressing:  Open (no dressing)   Procedure completion:  Tolerated well, no immediate complications     Medications Ordered in ED Medications  lidocaine (PF) (XYLOCAINE) 1 % injection 5 mL (has no administration in time range)    ED Course/ Medical Decision Making/ A&P                           Medical Decision Making Amount and/or Complexity of Data Reviewed Radiology: ordered.  Risk Prescription drug management.   This patient presents to the ED for concern of fall, this involves an extensive number of treatment options, and is a complaint that carries with it a high risk of complications and morbidity.  The differential diagnosis includes but not limited to skull fracture, intracranial injury, C-spine injury, hand fracture, contusion   Co morbidities that complicate the patient evaluation  Anemia, hyperlipidemia, hypertension, GERD, recent left knee surgery   Additional history obtained:  Additional history obtained from daughter at bedside External records from outside source obtained and reviewed including recent visit to PCP office dated 04/19/2022, evaluated for elevated blood pressure  Imaging Studies ordered:  I ordered imaging studies including CT head, C-spine,  x-ray right hand I independently visualized and interpreted imaging which showed no acute intracranial injury.  Possible right fifth metacarpal fracture I agree with the radiologist interpretation  Problem List / ED Course / Critical interventions / Medication management  81 year old female presents with laceration over the right eyebrow with pain and swelling, ecchymosis to her hand along the right fifth metacarpal.  X-ray of the right hand is  concerning for possible fracture, will be placed in a ulnar gutter splint prior to discharge.  Right face wound was irrigated and closed with sutures, recommend wound recheck with PCP in 2 days with removal in 5 days.  CT head and C-spine otherwise without acute bony injury or abnormality. I have reviewed the patients home medicines and have made adjustments as needed   Social Determinants of Health:  Lives alone, does have daughter visiting from out of state, second daughter lives in [place].  Patient's neighbor is also at bedside.   Test / Admission - Considered:  Further work-up considered however after completion of imaging, felt stable for discharge to follow-up outpatient.         Final Clinical Impression(s) / ED Diagnoses Final diagnoses:  Fall, initial encounter  Closed nondisplaced fracture of fifth metacarpal bone of right hand, unspecified portion of metacarpal, initial encounter  Facial laceration, initial encounter    Rx / DC Orders ED Discharge Orders     None         Tacy Learn, PA-C 04/29/22 1241    Isla Pence, MD 04/29/22 1434

## 2022-05-02 ENCOUNTER — Telehealth: Payer: Medicare Other | Admitting: Internal Medicine

## 2022-05-02 DIAGNOSIS — S62356A Nondisplaced fracture of shaft of fifth metacarpal bone, right hand, initial encounter for closed fracture: Secondary | ICD-10-CM | POA: Diagnosis not present

## 2022-05-03 DIAGNOSIS — Z96652 Presence of left artificial knee joint: Secondary | ICD-10-CM | POA: Diagnosis not present

## 2022-05-03 DIAGNOSIS — M6281 Muscle weakness (generalized): Secondary | ICD-10-CM | POA: Diagnosis not present

## 2022-05-03 DIAGNOSIS — M25662 Stiffness of left knee, not elsewhere classified: Secondary | ICD-10-CM | POA: Diagnosis not present

## 2022-05-04 ENCOUNTER — Encounter: Payer: Self-pay | Admitting: Internal Medicine

## 2022-05-04 NOTE — Patient Instructions (Addendum)
    Your sutures were removed today.    Blood work was ordered.     Medications changes include :   none     Return in about 6 months (around 11/03/2022) for follow up.

## 2022-05-04 NOTE — Progress Notes (Signed)
Subjective:    Patient ID: Emily Sweeney, female    DOB: 08-14-1941, 81 y.o.   MRN: [id number]     HPI Emily Sweeney is here for follow up of her chronic medical problems, including DM, htn, Hld, chronic left knee pain, GERD, iron def anemia, vit d def, OP  S/p LTK replacement - doing PT-slowly improving. 9/16 ED for fall, fracture of hand, head laceration.   Doing better.  Still recovering from recent fall - has a couple of cuts on right hand and bruising on face.  Needs sutures removed.   Medications and allergies reviewed with patient and updated if appropriate.  Current Outpatient Medications on File Prior to Visit  Medication Sig Dispense Refill   aspirin EC 81 MG tablet Take 1 tablet (81 mg total) by mouth daily. 90 tablet 1   atorvastatin (LIPITOR) 20 MG tablet TAKE 1 TABLET ONCE DAILY. (Patient taking differently: Take 20 mg by mouth daily.) 90 tablet 3   B Complex Vitamins (VITAMIN B COMPLEX PO) Take 1 tablet by mouth daily.     dicyclomine (BENTYL) 10 MG capsule Take 1 capsule (10 mg total) by mouth 3 (three) times daily before meals. (Patient taking differently: Take 10 mg by mouth daily.) 90 capsule 11   fluticasone (FLONASE) 50 MCG/ACT nasal spray Place 1 spray into both nostrils daily as needed for allergies or rhinitis.     metoprolol succinate (TOPROL-XL) 25 MG 24 hr tablet Take 1.5 tablets (37.5 mg total) by mouth daily. 135 tablet 1   Multiple Vitamins-Minerals (HAIR SKIN AND NAILS FORMULA) TABS Take 1 tablet by mouth daily.     oxyCODONE (OXY IR/ROXICODONE) 5 MG immediate release tablet Take 5 mg by mouth every 4 (four) hours as needed for severe pain.     pantoprazole (PROTONIX) 40 MG tablet TAKE 1 TABLET ONCE DAILY. (Patient taking differently: Take 40 mg by mouth daily.) 90 tablet 3   Probiotic Product (PROBIOTIC ADVANCED PO) Take 1 capsule by mouth daily.     Propylene Glycol (SYSTANE BALANCE OP) Place 1 drop into both eyes daily as needed (dry eyes).      ramipril (ALTACE) 10 MG capsule Take 1 capsule (10 mg total) by mouth daily. 90 capsule 3   simethicone (MYLICON) 80 MG chewable tablet Chew 80 mg by mouth every 6 (six) hours as needed for flatulence.     spironolactone (ALDACTONE) 25 MG tablet TAKE 1/2 TABLET BY MOUTH DAILY 45 tablet 3   tiZANidine (ZANAFLEX) 2 MG tablet Take 2 mg by mouth daily as needed for muscle spasms.     VITAMIN D, CHOLECALCIFEROL, PO Take 5,000 Units by mouth daily.     No current facility-administered medications on file prior to visit.     Review of Systems  Constitutional:  Negative for chills and fever.  Eyes:  Negative for visual disturbance.  Respiratory:  Negative for cough, shortness of breath and wheezing.   Cardiovascular:  Negative for chest pain, palpitations and leg swelling.  Gastrointestinal:  Negative for nausea.  Neurological:  Positive for headaches. Negative for dizziness, weakness, light-headedness and numbness.       Objective:   Vitals:   05/05/22 0826  BP: 120/78  Pulse: 97  Temp: (!) 97.5 F (36.4 C)  SpO2: 97%   BP Readings from Last 3 Encounters:  05/05/22 120/78  04/29/22 (!) 151/82  04/19/22 (!) 148/86   Wt Readings from Last 3 Encounters:  05/05/22 140 lb (63.5 kg)  04/29/22  143 lb (64.9 kg)  04/19/22 147 lb (66.7 kg)   Body mass index is 22.6 kg/m.    Physical Exam Constitutional:      General: She is not in acute distress.    Appearance: Normal appearance.  HENT:     Head: Normocephalic.     Comments: Resolving bruising on the forehead down to upper cheeks-mostly yellowish in color.  Small laceration right mid forehead with sutures and scabbing Eyes:     Conjunctiva/sclera: Conjunctivae normal.  Cardiovascular:     Rate and Rhythm: Normal rate and regular rhythm.     Heart sounds: Normal heart sounds. No murmur heard. Pulmonary:     Effort: Pulmonary effort is normal. No respiratory distress.     Breath sounds: Normal breath sounds. No wheezing.   Musculoskeletal:     Cervical back: Neck supple.     Right lower leg: No edema.     Left lower leg: No edema.  Lymphadenopathy:     Cervical: No cervical adenopathy.  Skin:    General: Skin is warm and dry.     Findings: No rash.     Comments: 2 small lacerations/skin tears right hand and right wrist, swelling right hand.  No evidence of infection  Neurological:     Mental Status: She is alert. Mental status is at baseline.  Psychiatric:        Mood and Affect: Mood normal.        Behavior: Behavior normal.       Procedure: Suture removal Indications: Head laceration with 4 sutures based in ED   Procedure Details Consent:  Verbal consent for procedure obtained.   The wound is healing well without signs of infection.  There is scab formation.  She has surrounding bruising secondary to the fall.  The 4 sutures were removed.  She will apply an antibacterial ointment or Vaseline.  No need to keep the area covered.  She will monitor for signs of infection, but this is unlikely given that it has closed and looks like it is healing well.  Flu vaccine today.    Lab Results  Component Value Date   WBC 7.0 04/15/2022   HGB 9.7 (L) 04/15/2022   HCT 30.1 (L) 04/15/2022   PLT 280 04/15/2022   GLUCOSE 106 (H) 04/15/2022   CHOL 178 12/20/2020   TRIG 66.0 12/20/2020   HDL 84.50 12/20/2020   LDLCALC 80 12/20/2020   ALT 10 01/02/2022   AST 17 01/02/2022   NA 143 04/15/2022   K 3.4 (L) 04/15/2022   CL 109 04/15/2022   CREATININE 0.83 04/15/2022   BUN 10 04/15/2022   CO2 28 04/15/2022   TSH 1.566 03/13/2021   INR 1.0 06/09/2015   HGBA1C 6.4 01/02/2022   MICROALBUR 1.1 01/02/2022     Assessment & Plan:    See Problem List for Assessment and Plan of chronic medical problems.

## 2022-05-05 ENCOUNTER — Encounter: Payer: Self-pay | Admitting: Internal Medicine

## 2022-05-05 ENCOUNTER — Ambulatory Visit (INDEPENDENT_AMBULATORY_CARE_PROVIDER_SITE_OTHER): Payer: Medicare Other | Admitting: Internal Medicine

## 2022-05-05 VITALS — BP 120/78 | HR 97 | Temp 97.5°F | Ht 66.0 in | Wt 140.0 lb

## 2022-05-05 DIAGNOSIS — Z23 Encounter for immunization: Secondary | ICD-10-CM

## 2022-05-05 DIAGNOSIS — S61411A Laceration without foreign body of right hand, initial encounter: Secondary | ICD-10-CM | POA: Diagnosis not present

## 2022-05-05 DIAGNOSIS — S61419A Laceration without foreign body of unspecified hand, initial encounter: Secondary | ICD-10-CM | POA: Insufficient documentation

## 2022-05-05 DIAGNOSIS — S0181XA Laceration without foreign body of other part of head, initial encounter: Secondary | ICD-10-CM

## 2022-05-05 DIAGNOSIS — Z4802 Encounter for removal of sutures: Secondary | ICD-10-CM | POA: Diagnosis not present

## 2022-05-05 DIAGNOSIS — S62359A Nondisplaced fracture of shaft of unspecified metacarpal bone, initial encounter for closed fracture: Secondary | ICD-10-CM | POA: Diagnosis not present

## 2022-05-05 DIAGNOSIS — M810 Age-related osteoporosis without current pathological fracture: Secondary | ICD-10-CM

## 2022-05-05 DIAGNOSIS — E119 Type 2 diabetes mellitus without complications: Secondary | ICD-10-CM

## 2022-05-05 DIAGNOSIS — K219 Gastro-esophageal reflux disease without esophagitis: Secondary | ICD-10-CM

## 2022-05-05 DIAGNOSIS — E559 Vitamin D deficiency, unspecified: Secondary | ICD-10-CM

## 2022-05-05 DIAGNOSIS — R42 Dizziness and giddiness: Secondary | ICD-10-CM

## 2022-05-05 DIAGNOSIS — S62309A Unspecified fracture of unspecified metacarpal bone, initial encounter for closed fracture: Secondary | ICD-10-CM | POA: Insufficient documentation

## 2022-05-05 DIAGNOSIS — S0191XA Laceration without foreign body of unspecified part of head, initial encounter: Secondary | ICD-10-CM | POA: Insufficient documentation

## 2022-05-05 DIAGNOSIS — I10 Essential (primary) hypertension: Secondary | ICD-10-CM | POA: Diagnosis not present

## 2022-05-05 DIAGNOSIS — E871 Hypo-osmolality and hyponatremia: Secondary | ICD-10-CM

## 2022-05-05 DIAGNOSIS — D509 Iron deficiency anemia, unspecified: Secondary | ICD-10-CM | POA: Diagnosis not present

## 2022-05-05 DIAGNOSIS — E785 Hyperlipidemia, unspecified: Secondary | ICD-10-CM

## 2022-05-05 LAB — CBC WITH DIFFERENTIAL/PLATELET
Basophils Absolute: 0 10*3/uL (ref 0.0–0.1)
Basophils Relative: 0.4 % (ref 0.0–3.0)
Eosinophils Absolute: 0 10*3/uL (ref 0.0–0.7)
Eosinophils Relative: 0.7 % (ref 0.0–5.0)
HCT: 35.2 % — ABNORMAL LOW (ref 36.0–46.0)
Hemoglobin: 11.7 g/dL — ABNORMAL LOW (ref 12.0–15.0)
Lymphocytes Relative: 17 % (ref 12.0–46.0)
Lymphs Abs: 1.2 10*3/uL (ref 0.7–4.0)
MCHC: 33.2 g/dL (ref 30.0–36.0)
MCV: 88.7 fl (ref 78.0–100.0)
Monocytes Absolute: 0.8 10*3/uL (ref 0.1–1.0)
Monocytes Relative: 11.3 % (ref 3.0–12.0)
Neutro Abs: 5.1 10*3/uL (ref 1.4–7.7)
Neutrophils Relative %: 70.6 % (ref 43.0–77.0)
Platelets: 286 10*3/uL (ref 150.0–400.0)
RBC: 3.97 Mil/uL (ref 3.87–5.11)
RDW: 14.9 % (ref 11.5–15.5)
WBC: 7.2 10*3/uL (ref 4.0–10.5)

## 2022-05-05 LAB — IBC PANEL
Iron: 61 ug/dL (ref 42–145)
Saturation Ratios: 14.1 % — ABNORMAL LOW (ref 20.0–50.0)
TIBC: 432.6 ug/dL (ref 250.0–450.0)
Transferrin: 309 mg/dL (ref 212.0–360.0)

## 2022-05-05 LAB — LIPID PANEL
Cholesterol: 137 mg/dL (ref 0–200)
HDL: 70.7 mg/dL (ref >39.00)
LDL Cholesterol: 56 mg/dL (ref 0–99)
NonHDL: 65.92
Total CHOL/HDL Ratio: 2
Triglycerides: 51 mg/dL (ref 0.0–149.0)
VLDL: 10.2 mg/dL (ref 0.0–40.0)

## 2022-05-05 LAB — COMPREHENSIVE METABOLIC PANEL
ALT: 11 U/L (ref 0–35)
AST: 15 U/L (ref 0–37)
Albumin: 4.2 g/dL (ref 3.5–5.2)
Alkaline Phosphatase: 92 U/L (ref 39–117)
BUN: 14 mg/dL (ref 6–23)
CO2: 23 mEq/L (ref 19–32)
Calcium: 9.7 mg/dL (ref 8.4–10.5)
Chloride: 91 mEq/L — ABNORMAL LOW (ref 96–112)
Creatinine, Ser: 1.01 mg/dL (ref 0.40–1.20)
GFR: 51.64 mL/min — ABNORMAL LOW (ref >60.00)
Glucose, Bld: 138 mg/dL — ABNORMAL HIGH (ref 70–99)
Potassium: 4.3 mEq/L (ref 3.5–5.1)
Sodium: 125 mEq/L — ABNORMAL LOW (ref 135–145)
Total Bilirubin: 0.8 mg/dL (ref 0.2–1.2)
Total Protein: 7.1 g/dL (ref 6.0–8.3)

## 2022-05-05 LAB — VITAMIN D 25 HYDROXY (VIT D DEFICIENCY, FRACTURES): VITD: 55.22 ng/mL (ref 30.00–100.00)

## 2022-05-05 LAB — FERRITIN: Ferritin: 74.8 ng/mL (ref 10.0–291.0)

## 2022-05-05 NOTE — Assessment & Plan Note (Signed)
Chronic Taking vitamin D daily Check vitamin D level  

## 2022-05-05 NOTE — Assessment & Plan Note (Signed)
Chronic GERD controlled Continue pantoprazole 40 mg daily 

## 2022-05-05 NOTE — Assessment & Plan Note (Signed)
Chronic °Regular exercise and healthy diet encouraged °Check lipid panel  °Continue atorvastatin 20 mg daily °

## 2022-05-05 NOTE — Assessment & Plan Note (Addendum)
Chronic DEXA to date Currently not on treatment Continue calcium and vitamin D daily Fall prevention discussed Had recent fall with fracture of hand-healing well Continue regular exercise

## 2022-05-05 NOTE — Assessment & Plan Note (Signed)
Chronic Blood pressure well controlled CMP Continue metoprolol XL 37.5 mg daily, ramipril 10 mg daily, spironolactone 12.5 mg daily

## 2022-05-05 NOTE — Assessment & Plan Note (Addendum)
A couple of small lacerations on right hand from recent fall Healing well without evidence of infection Will re- bandage and she will apply antibacterial cream She will monitor for infection Td up-to-date

## 2022-05-05 NOTE — Assessment & Plan Note (Signed)
Acute Secondary to recent fall 4 sutures in place Healing well without evidence of infection Sutures removed today which she tolerated well She will apply Vaseline or Neosporin and monitor for infection and call with questions

## 2022-05-05 NOTE — Assessment & Plan Note (Signed)
Chronic  Lab Results  Component Value Date   HGBA1C 6.4 01/02/2022   Sugars well controlled Check A1c Continue lifestyle control Stressed regular exercise, diabetic diet

## 2022-05-05 NOTE — Assessment & Plan Note (Signed)
History of iron deficiency anemia Check CBC, iron panel

## 2022-05-05 NOTE — Assessment & Plan Note (Signed)
Acute Related to recent fall Nondisplaced and healing naturally Still has pain and some swelling We will rebandage the area

## 2022-05-08 LAB — HEMOGLOBIN A1C: Hgb A1c MFr Bld: 6.2 % (ref 4.6–6.5)

## 2022-05-09 NOTE — Addendum Note (Signed)
Addended by: Binnie Rail on: 05/09/2022 09:02 AM   Modules accepted: Orders

## 2022-05-10 DIAGNOSIS — Z96652 Presence of left artificial knee joint: Secondary | ICD-10-CM | POA: Diagnosis not present

## 2022-05-10 DIAGNOSIS — M6281 Muscle weakness (generalized): Secondary | ICD-10-CM | POA: Diagnosis not present

## 2022-05-10 DIAGNOSIS — M25662 Stiffness of left knee, not elsewhere classified: Secondary | ICD-10-CM | POA: Diagnosis not present

## 2022-05-13 ENCOUNTER — Other Ambulatory Visit: Payer: Self-pay | Admitting: Internal Medicine

## 2022-05-16 ENCOUNTER — Other Ambulatory Visit (INDEPENDENT_AMBULATORY_CARE_PROVIDER_SITE_OTHER): Payer: Medicare Other

## 2022-05-16 DIAGNOSIS — Z96652 Presence of left artificial knee joint: Secondary | ICD-10-CM | POA: Diagnosis not present

## 2022-05-16 DIAGNOSIS — E871 Hypo-osmolality and hyponatremia: Secondary | ICD-10-CM | POA: Diagnosis not present

## 2022-05-16 DIAGNOSIS — M6281 Muscle weakness (generalized): Secondary | ICD-10-CM | POA: Diagnosis not present

## 2022-05-16 DIAGNOSIS — M25662 Stiffness of left knee, not elsewhere classified: Secondary | ICD-10-CM | POA: Diagnosis not present

## 2022-05-16 LAB — BASIC METABOLIC PANEL
BUN: 16 mg/dL (ref 6–23)
CO2: 24 mEq/L (ref 19–32)
Calcium: 9.8 mg/dL (ref 8.4–10.5)
Chloride: 96 mEq/L (ref 96–112)
Creatinine, Ser: 1.04 mg/dL (ref 0.40–1.20)
GFR: 49.84 mL/min — ABNORMAL LOW (ref >60.00)
Glucose, Bld: 124 mg/dL — ABNORMAL HIGH (ref 70–99)
Potassium: 4.6 mEq/L (ref 3.5–5.1)
Sodium: 130 mEq/L — ABNORMAL LOW (ref 135–145)

## 2022-05-19 DIAGNOSIS — Z96652 Presence of left artificial knee joint: Secondary | ICD-10-CM | POA: Diagnosis not present

## 2022-05-19 DIAGNOSIS — M25662 Stiffness of left knee, not elsewhere classified: Secondary | ICD-10-CM | POA: Diagnosis not present

## 2022-05-19 DIAGNOSIS — M6281 Muscle weakness (generalized): Secondary | ICD-10-CM | POA: Diagnosis not present

## 2022-05-22 DIAGNOSIS — M25662 Stiffness of left knee, not elsewhere classified: Secondary | ICD-10-CM | POA: Diagnosis not present

## 2022-05-22 DIAGNOSIS — M6281 Muscle weakness (generalized): Secondary | ICD-10-CM | POA: Diagnosis not present

## 2022-05-22 DIAGNOSIS — Z96652 Presence of left artificial knee joint: Secondary | ICD-10-CM | POA: Diagnosis not present

## 2022-05-23 DIAGNOSIS — M5416 Radiculopathy, lumbar region: Secondary | ICD-10-CM | POA: Diagnosis not present

## 2022-05-23 DIAGNOSIS — S335XXD Sprain of ligaments of lumbar spine, subsequent encounter: Secondary | ICD-10-CM | POA: Diagnosis not present

## 2022-05-28 ENCOUNTER — Other Ambulatory Visit: Payer: Self-pay | Admitting: Gastroenterology

## 2022-05-28 DIAGNOSIS — K219 Gastro-esophageal reflux disease without esophagitis: Secondary | ICD-10-CM

## 2022-05-30 ENCOUNTER — Telehealth: Payer: Self-pay | Admitting: Gastroenterology

## 2022-05-30 DIAGNOSIS — M6281 Muscle weakness (generalized): Secondary | ICD-10-CM | POA: Diagnosis not present

## 2022-05-30 DIAGNOSIS — K219 Gastro-esophageal reflux disease without esophagitis: Secondary | ICD-10-CM

## 2022-05-30 DIAGNOSIS — M25662 Stiffness of left knee, not elsewhere classified: Secondary | ICD-10-CM | POA: Diagnosis not present

## 2022-05-30 DIAGNOSIS — Z96652 Presence of left artificial knee joint: Secondary | ICD-10-CM | POA: Diagnosis not present

## 2022-05-30 MED ORDER — PANTOPRAZOLE SODIUM 40 MG PO TBEC: 40.0000 mg | DELAYED_RELEASE_TABLET | Freq: Every day | ORAL | 0 refills | Status: DC

## 2022-05-30 NOTE — Telephone Encounter (Signed)
Prescription sent to patient's pharmacy until scheduled upcoming appt.

## 2022-05-30 NOTE — Telephone Encounter (Signed)
Inbound call from patient requesting refill on protonix, she has an appt 12/6 but needs her rx before she is able to be seen. Please advise

## 2022-06-01 DIAGNOSIS — M5416 Radiculopathy, lumbar region: Secondary | ICD-10-CM | POA: Diagnosis not present

## 2022-06-01 DIAGNOSIS — S335XXD Sprain of ligaments of lumbar spine, subsequent encounter: Secondary | ICD-10-CM | POA: Diagnosis not present

## 2022-06-01 DIAGNOSIS — S62366D Nondisplaced fracture of neck of fifth metacarpal bone, right hand, subsequent encounter for fracture with routine healing: Secondary | ICD-10-CM | POA: Diagnosis not present

## 2022-06-02 DIAGNOSIS — M25662 Stiffness of left knee, not elsewhere classified: Secondary | ICD-10-CM | POA: Diagnosis not present

## 2022-06-02 DIAGNOSIS — M6281 Muscle weakness (generalized): Secondary | ICD-10-CM | POA: Diagnosis not present

## 2022-06-02 DIAGNOSIS — Z96652 Presence of left artificial knee joint: Secondary | ICD-10-CM | POA: Diagnosis not present

## 2022-06-05 DIAGNOSIS — S335XXD Sprain of ligaments of lumbar spine, subsequent encounter: Secondary | ICD-10-CM | POA: Diagnosis not present

## 2022-06-05 DIAGNOSIS — M5416 Radiculopathy, lumbar region: Secondary | ICD-10-CM | POA: Diagnosis not present

## 2022-06-07 DIAGNOSIS — M6281 Muscle weakness (generalized): Secondary | ICD-10-CM | POA: Diagnosis not present

## 2022-06-07 DIAGNOSIS — M25662 Stiffness of left knee, not elsewhere classified: Secondary | ICD-10-CM | POA: Diagnosis not present

## 2022-06-07 DIAGNOSIS — Z96652 Presence of left artificial knee joint: Secondary | ICD-10-CM | POA: Diagnosis not present

## 2022-06-09 DIAGNOSIS — S335XXD Sprain of ligaments of lumbar spine, subsequent encounter: Secondary | ICD-10-CM | POA: Diagnosis not present

## 2022-06-09 DIAGNOSIS — M5416 Radiculopathy, lumbar region: Secondary | ICD-10-CM | POA: Diagnosis not present

## 2022-06-12 DIAGNOSIS — M6281 Muscle weakness (generalized): Secondary | ICD-10-CM | POA: Diagnosis not present

## 2022-06-12 DIAGNOSIS — M25662 Stiffness of left knee, not elsewhere classified: Secondary | ICD-10-CM | POA: Diagnosis not present

## 2022-06-12 DIAGNOSIS — Z96652 Presence of left artificial knee joint: Secondary | ICD-10-CM | POA: Diagnosis not present

## 2022-06-14 ENCOUNTER — Ambulatory Visit: Payer: Medicare Other | Admitting: Podiatry

## 2022-06-14 DIAGNOSIS — M25662 Stiffness of left knee, not elsewhere classified: Secondary | ICD-10-CM | POA: Diagnosis not present

## 2022-06-14 DIAGNOSIS — Z96652 Presence of left artificial knee joint: Secondary | ICD-10-CM | POA: Diagnosis not present

## 2022-06-14 DIAGNOSIS — M6281 Muscle weakness (generalized): Secondary | ICD-10-CM | POA: Diagnosis not present

## 2022-06-15 DIAGNOSIS — M5416 Radiculopathy, lumbar region: Secondary | ICD-10-CM | POA: Diagnosis not present

## 2022-06-15 DIAGNOSIS — S335XXD Sprain of ligaments of lumbar spine, subsequent encounter: Secondary | ICD-10-CM | POA: Diagnosis not present

## 2022-06-19 ENCOUNTER — Other Ambulatory Visit: Payer: Self-pay | Admitting: Gastroenterology

## 2022-06-19 DIAGNOSIS — S335XXD Sprain of ligaments of lumbar spine, subsequent encounter: Secondary | ICD-10-CM | POA: Diagnosis not present

## 2022-06-19 DIAGNOSIS — M5416 Radiculopathy, lumbar region: Secondary | ICD-10-CM | POA: Diagnosis not present

## 2022-06-21 DIAGNOSIS — M25662 Stiffness of left knee, not elsewhere classified: Secondary | ICD-10-CM | POA: Diagnosis not present

## 2022-06-21 DIAGNOSIS — M6281 Muscle weakness (generalized): Secondary | ICD-10-CM | POA: Diagnosis not present

## 2022-06-21 DIAGNOSIS — Z96652 Presence of left artificial knee joint: Secondary | ICD-10-CM | POA: Diagnosis not present

## 2022-06-23 DIAGNOSIS — M25662 Stiffness of left knee, not elsewhere classified: Secondary | ICD-10-CM | POA: Diagnosis not present

## 2022-06-23 DIAGNOSIS — M5416 Radiculopathy, lumbar region: Secondary | ICD-10-CM | POA: Diagnosis not present

## 2022-06-23 DIAGNOSIS — Z96652 Presence of left artificial knee joint: Secondary | ICD-10-CM | POA: Diagnosis not present

## 2022-06-23 DIAGNOSIS — M6281 Muscle weakness (generalized): Secondary | ICD-10-CM | POA: Diagnosis not present

## 2022-06-23 DIAGNOSIS — S335XXD Sprain of ligaments of lumbar spine, subsequent encounter: Secondary | ICD-10-CM | POA: Diagnosis not present

## 2022-06-26 DIAGNOSIS — Z96652 Presence of left artificial knee joint: Secondary | ICD-10-CM | POA: Diagnosis not present

## 2022-06-26 DIAGNOSIS — M25662 Stiffness of left knee, not elsewhere classified: Secondary | ICD-10-CM | POA: Diagnosis not present

## 2022-06-26 DIAGNOSIS — M6281 Muscle weakness (generalized): Secondary | ICD-10-CM | POA: Diagnosis not present

## 2022-06-26 DIAGNOSIS — M5416 Radiculopathy, lumbar region: Secondary | ICD-10-CM | POA: Diagnosis not present

## 2022-06-26 DIAGNOSIS — S335XXD Sprain of ligaments of lumbar spine, subsequent encounter: Secondary | ICD-10-CM | POA: Diagnosis not present

## 2022-06-28 DIAGNOSIS — M6281 Muscle weakness (generalized): Secondary | ICD-10-CM | POA: Diagnosis not present

## 2022-06-28 DIAGNOSIS — Z96652 Presence of left artificial knee joint: Secondary | ICD-10-CM | POA: Diagnosis not present

## 2022-06-28 DIAGNOSIS — M25662 Stiffness of left knee, not elsewhere classified: Secondary | ICD-10-CM | POA: Diagnosis not present

## 2022-07-03 DIAGNOSIS — S335XXD Sprain of ligaments of lumbar spine, subsequent encounter: Secondary | ICD-10-CM | POA: Diagnosis not present

## 2022-07-03 DIAGNOSIS — M5416 Radiculopathy, lumbar region: Secondary | ICD-10-CM | POA: Diagnosis not present

## 2022-07-05 DIAGNOSIS — M25662 Stiffness of left knee, not elsewhere classified: Secondary | ICD-10-CM | POA: Diagnosis not present

## 2022-07-05 DIAGNOSIS — M6281 Muscle weakness (generalized): Secondary | ICD-10-CM | POA: Diagnosis not present

## 2022-07-05 DIAGNOSIS — Z96652 Presence of left artificial knee joint: Secondary | ICD-10-CM | POA: Diagnosis not present

## 2022-07-10 DIAGNOSIS — M5416 Radiculopathy, lumbar region: Secondary | ICD-10-CM | POA: Diagnosis not present

## 2022-07-10 DIAGNOSIS — S335XXD Sprain of ligaments of lumbar spine, subsequent encounter: Secondary | ICD-10-CM | POA: Diagnosis not present

## 2022-07-12 DIAGNOSIS — Z96652 Presence of left artificial knee joint: Secondary | ICD-10-CM | POA: Diagnosis not present

## 2022-07-12 DIAGNOSIS — S335XXD Sprain of ligaments of lumbar spine, subsequent encounter: Secondary | ICD-10-CM | POA: Diagnosis not present

## 2022-07-12 DIAGNOSIS — M6281 Muscle weakness (generalized): Secondary | ICD-10-CM | POA: Diagnosis not present

## 2022-07-12 DIAGNOSIS — M25662 Stiffness of left knee, not elsewhere classified: Secondary | ICD-10-CM | POA: Diagnosis not present

## 2022-07-12 DIAGNOSIS — M5416 Radiculopathy, lumbar region: Secondary | ICD-10-CM | POA: Diagnosis not present

## 2022-07-17 DIAGNOSIS — Z96652 Presence of left artificial knee joint: Secondary | ICD-10-CM | POA: Diagnosis not present

## 2022-07-17 DIAGNOSIS — S335XXD Sprain of ligaments of lumbar spine, subsequent encounter: Secondary | ICD-10-CM | POA: Diagnosis not present

## 2022-07-17 DIAGNOSIS — M5416 Radiculopathy, lumbar region: Secondary | ICD-10-CM | POA: Diagnosis not present

## 2022-07-17 DIAGNOSIS — M6281 Muscle weakness (generalized): Secondary | ICD-10-CM | POA: Diagnosis not present

## 2022-07-17 DIAGNOSIS — M25662 Stiffness of left knee, not elsewhere classified: Secondary | ICD-10-CM | POA: Diagnosis not present

## 2022-07-19 ENCOUNTER — Encounter: Payer: Self-pay | Admitting: Gastroenterology

## 2022-07-19 ENCOUNTER — Ambulatory Visit (INDEPENDENT_AMBULATORY_CARE_PROVIDER_SITE_OTHER): Payer: Medicare Other | Admitting: Gastroenterology

## 2022-07-19 VITALS — BP 120/68 | HR 65 | Ht 66.0 in | Wt 138.0 lb

## 2022-07-19 DIAGNOSIS — K219 Gastro-esophageal reflux disease without esophagitis: Secondary | ICD-10-CM

## 2022-07-19 DIAGNOSIS — Z8719 Personal history of other diseases of the digestive system: Secondary | ICD-10-CM

## 2022-07-19 DIAGNOSIS — M5416 Radiculopathy, lumbar region: Secondary | ICD-10-CM | POA: Diagnosis not present

## 2022-07-19 DIAGNOSIS — K449 Diaphragmatic hernia without obstruction or gangrene: Secondary | ICD-10-CM | POA: Diagnosis not present

## 2022-07-19 DIAGNOSIS — S335XXD Sprain of ligaments of lumbar spine, subsequent encounter: Secondary | ICD-10-CM | POA: Diagnosis not present

## 2022-07-19 MED ORDER — PANTOPRAZOLE SODIUM 40 MG PO TBEC: 40.0000 mg | DELAYED_RELEASE_TABLET | Freq: Every day | ORAL | 3 refills | Status: DC

## 2022-07-19 MED ORDER — DICYCLOMINE HCL 10 MG PO CAPS: 10.0000 mg | ORAL_CAPSULE | Freq: Three times a day (TID) | ORAL | 3 refills | Status: DC

## 2022-07-19 NOTE — Progress Notes (Signed)
    Assessment     GERD with history of an esophageal stricture, a large hiatal hernia and erosive gastritis Personal history of adenomatous colon polyps, no longer in surveillance Gas, bloating    Recommendations    Continue pantoprazole 40 mg po qd  2.   Dicyclomine 10 mg po tid and Gas-X po tid 3.   REV in 1 year   HPI    This is an 81 year old female returning for follow-up of GERD, abdominal pain and bloating.  Her symptoms are well controlled on pantoprazole dicyclomine and Gas-X.  EGD Oct 2021 - Benign-appearing esophageal stenosis. Dilated. - Large hiatal hernia. - Erosive gastropathy with no bleeding and no stigmata of recent bleeding. Biopsied. - Erythematous mucosa in the gastric body and antrum. Biopsied. - Normal duodenal bulb and second portion of the duodenum.   Labs / Imaging       Latest Ref Rng & Units 05/05/2022    9:19 AM 01/02/2022    9:23 AM 12/20/2020   10:39 AM  Hepatic Function  Total Protein 6.0 - 8.3 g/dL 7.1  7.0  6.9   Albumin 3.5 - 5.2 g/dL 4.2  4.3  4.2   AST 0 - 37 U/L _0 ALT 0 - 35 U/L _1 Alk Phosphatase 39 - 117 U/L 92  72  69   Total Bilirubin 0.2 - 1.2 mg/dL 0.8  0.5  0.7        Latest Ref Rng & Units 05/05/2022    9:19 AM 04/15/2022    8:23 PM 04/12/2022   10:19 AM  CBC  WBC 4.0 - 10.5 K/uL 7.2  7.0  8.8   Hemoglobin 12.0 - 15.0 g/dL 11.7  9.7  10.2   Hematocrit 36.0 - 46.0 % 35.2  30.1  31.0   Platelets 150.0 - 400.0 K/uL 286.0  280  244.0    Current Medications, Allergies, Past Medical History, Past Surgical History, Family History and Social History were reviewed in [employer] record.   Physical Exam: General: Well developed, well nourished, elderly, no acute distress Head: Normocephalic and atraumatic Eyes: Sclerae anicteric, EOMI Ears: Normal auditory acuity Mouth: No deformities or lesions noted Lungs: Clear throughout to auscultation Heart: Regular rate and rhythm; No  murmurs, rubs or bruits Abdomen: Soft, non tender and non distended. No masses, hepatosplenomegaly or hernias noted. Normal Bowel sounds Rectal: Not done Musculoskeletal: Symmetrical with no gross deformities  Pulses:  Normal pulses noted Extremities: No edema or deformities noted Neurological: Alert oriented x 4, grossly nonfocal Psychological:  Alert and cooperative. Normal mood and affect   Pattijo Juste T. Fuller Plan, MD 07/19/2022, 9:31 AM

## 2022-07-19 NOTE — Patient Instructions (Signed)
We have sent the following medications to your pharmacy for you to pick up at your convenience: pantoprazole and dicyclomine.   The [facility] providers would like to encourage you to use [facility] to communicate with providers for non-urgent requests or questions.  Due to long hold times on the telephone, sending your provider a message by [facility] may be a faster and more efficient way to get a response.  Please allow 48 business hours for a response.  Please remember that this is for non-urgent requests.   Thank you for choosing me and [facility].  Pricilla Riffle. Dagoberto Ligas., MD., Marval Regal

## 2022-07-25 DIAGNOSIS — M6281 Muscle weakness (generalized): Secondary | ICD-10-CM | POA: Diagnosis not present

## 2022-07-25 DIAGNOSIS — Z96652 Presence of left artificial knee joint: Secondary | ICD-10-CM | POA: Diagnosis not present

## 2022-07-25 DIAGNOSIS — M25662 Stiffness of left knee, not elsewhere classified: Secondary | ICD-10-CM | POA: Diagnosis not present

## 2022-07-28 DIAGNOSIS — M5416 Radiculopathy, lumbar region: Secondary | ICD-10-CM | POA: Diagnosis not present

## 2022-07-28 DIAGNOSIS — S335XXD Sprain of ligaments of lumbar spine, subsequent encounter: Secondary | ICD-10-CM | POA: Diagnosis not present

## 2022-08-01 DIAGNOSIS — M6281 Muscle weakness (generalized): Secondary | ICD-10-CM | POA: Diagnosis not present

## 2022-08-01 DIAGNOSIS — Z96652 Presence of left artificial knee joint: Secondary | ICD-10-CM | POA: Diagnosis not present

## 2022-08-01 DIAGNOSIS — M25662 Stiffness of left knee, not elsewhere classified: Secondary | ICD-10-CM | POA: Diagnosis not present

## 2022-08-04 DIAGNOSIS — S335XXD Sprain of ligaments of lumbar spine, subsequent encounter: Secondary | ICD-10-CM | POA: Diagnosis not present

## 2022-08-04 DIAGNOSIS — M5416 Radiculopathy, lumbar region: Secondary | ICD-10-CM | POA: Diagnosis not present

## 2022-08-11 DIAGNOSIS — S335XXD Sprain of ligaments of lumbar spine, subsequent encounter: Secondary | ICD-10-CM | POA: Diagnosis not present

## 2022-08-15 DIAGNOSIS — M25662 Stiffness of left knee, not elsewhere classified: Secondary | ICD-10-CM | POA: Diagnosis not present

## 2022-08-15 DIAGNOSIS — Z96652 Presence of left artificial knee joint: Secondary | ICD-10-CM | POA: Diagnosis not present

## 2022-08-15 DIAGNOSIS — M6281 Muscle weakness (generalized): Secondary | ICD-10-CM | POA: Diagnosis not present

## 2022-08-21 ENCOUNTER — Encounter: Payer: Self-pay | Admitting: Internal Medicine

## 2022-08-21 NOTE — Progress Notes (Unsigned)
Virtual Visit via Video Note  I connected with Emily Sweeney on 08/21/22 at 10:00 AM EST by a video enabled telemedicine application and verified that I am speaking with the correct person using two identifiers.   I discussed the limitations of evaluation and management by telemedicine and the availability of in person appointments. The patient expressed understanding and agreed to proceed.  Present for the visit:  Myself, Dr Billey Gosling, Emily Sweeney.  The patient is currently at home and I am in the office.    No referring provider.    History of Present Illness: This is an acute visit for balance issues.    Has been in PT ever since knee replacement - graduated last week from knee therapy.  Her balance is still not good and the physical therapist recommended further physical therapy focusing on balance and she would like to do that.  She would like a referral.   Blood pressure was elevated a couple of times at physical therapy, but she has been monitoring at home and it has been controlled-  BP 128/82  86 yesterday       135/95  80   Questions about vaccines.  She is taking her medication as prescribed.  Social History   Socioeconomic History   Marital status: Divorced    Spouse name: Not on file   Number of children: Not on file   Years of education: Not on file   Highest education level: Not on file  Occupational History   Occupation: psychologist  Tobacco Use   Smoking status: Former    Types: Cigarettes    Quit date: 07/04/1991    Years since quitting: 31.1   Smokeless tobacco: Never  Vaping Use   Vaping Use: Never used  Substance and Sexual Activity   Alcohol use: Yes    Alcohol/week: 0.0 standard drinks of alcohol    Comment: glass wine 4-5 days a week   Drug use: No   Sexual activity: Not Currently  Other Topics Concern   Not on file  Social History Narrative   Not on file   Social Determinants of Health   Financial Resource Strain: Low Risk   (02/24/2022)   Overall Financial Resource Strain (CARDIA)    Difficulty of Paying Living Expenses: Not hard at all  Food Insecurity: No Food Insecurity (02/24/2022)   Hunger Vital Sign    Worried About Running Out of Food in the Last Year: Never true    Ran Out of Food in the Last Year: Never true  Transportation Needs: No Transportation Needs (02/24/2022)   PRAPARE - Hydrologist (Medical): No    Lack of Transportation (Non-Medical): No  Physical Activity: Sufficiently Active (02/24/2022)   Exercise Vital Sign    Days of Exercise per Week: 5 days    Minutes of Exercise per Session: 30 min  Stress: No Stress Concern Present (02/24/2022)   [place]    Feeling of Stress : Not at all  Social Connections: Socially Isolated (02/24/2022)   Social Connection and Isolation Panel [NHANES]    Frequency of Communication with Friends and Family: More than three times a week    Frequency of Social Gatherings with Friends and Family: Three times a week    Attends Religious Services: Never    Active Member of Clubs or Organizations: No    Attends Archivist Meetings: Never    Marital Status: Divorced  Observations/Objective: Appears well in NAD Breathing normally Normal mood  Assessment and Plan:  See Problem List for Assessment and Plan of chronic medical problems.   Follow Up Instructions:    I discussed the assessment and treatment plan with the patient. The patient was provided an opportunity to ask questions and all were answered. The patient agreed with the plan and demonstrated an understanding of the instructions.   The patient was advised to call back or seek an in-person evaluation if the symptoms worsen or if the condition fails to improve as anticipated.    Binnie Rail, MD

## 2022-08-22 ENCOUNTER — Telehealth (INDEPENDENT_AMBULATORY_CARE_PROVIDER_SITE_OTHER): Payer: Medicare Other | Admitting: Internal Medicine

## 2022-08-22 DIAGNOSIS — I10 Essential (primary) hypertension: Secondary | ICD-10-CM | POA: Diagnosis not present

## 2022-08-22 DIAGNOSIS — R2689 Other abnormalities of gait and mobility: Secondary | ICD-10-CM | POA: Diagnosis not present

## 2022-08-22 NOTE — Assessment & Plan Note (Signed)
Chronic Blood pressure well-controlled at home per patient's readings No change in medication-continue metoprolol XL 37.5 mg daily, Altace 10 mg daily, spironolactone 12.5 mg daily She will continue to monitor at home

## 2022-08-22 NOTE — Assessment & Plan Note (Signed)
Chronic possibly worse recently She would like to do additional physical therapy to see if that helps which I think is a very good idea Referral ordered for [facility] per her request

## 2022-08-23 DIAGNOSIS — M5416 Radiculopathy, lumbar region: Secondary | ICD-10-CM | POA: Diagnosis not present

## 2022-08-23 DIAGNOSIS — S335XXD Sprain of ligaments of lumbar spine, subsequent encounter: Secondary | ICD-10-CM | POA: Diagnosis not present

## 2022-08-30 DIAGNOSIS — R2689 Other abnormalities of gait and mobility: Secondary | ICD-10-CM | POA: Diagnosis not present

## 2022-09-01 ENCOUNTER — Ambulatory Visit: Payer: Medicare Other | Admitting: Internal Medicine

## 2022-09-03 NOTE — Progress Notes (Signed)
Subjective:    Patient ID: Emily Sweeney, female    DOB: 16-Apr-1941, 82 y.o.   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    She is here today form filled out-she has an income protection plan/disability form.   Reason for disability -left knee osteoarthritis s/p left knee replacement - 8/25 - day surgery.  Has done PT - still doing some PT.  Ambulating now primarily w/o walker -but needed walker for a while.  Uses walker on occasion.  Balance is poor - doing PT for balance now.   Uses walker on occasion.  Has had falls.    Has chronic intermittent back pain.    Htn - chronic BP elevated after surgery -possibly secondary to prednisone use-she did have to go to ED 04/15/22.    Has HHA 3/week via long term health insurance.  Some chest congestion -- taking mucinex.  This is intermittent - gets better then worse.     Medications and allergies reviewed with patient and updated if appropriate.  Current Outpatient Medications on File Prior to Visit  Medication Sig Dispense Refill   aspirin EC 81 MG tablet Take 1 tablet (81 mg total) by mouth daily. 90 tablet 1   atorvastatin (LIPITOR) 20 MG tablet TAKE 1 TABLET ONCE DAILY. (Patient taking differently: Take 20 mg by mouth daily.) 90 tablet 3   B Complex Vitamins (VITAMIN B COMPLEX PO) Take 1 tablet by mouth daily.     dicyclomine (BENTYL) 10 MG capsule Take 1 capsule (10 mg total) by mouth 3 (three) times daily before meals. 270 capsule 3   fluticasone (FLONASE) 50 MCG/ACT nasal spray Place 1 spray into both nostrils daily as needed for allergies or rhinitis.     metoprolol succinate (TOPROL-XL) 25 MG 24 hr tablet Take 1.5 tablets (37.5 mg total) by mouth daily. 135 tablet 1   Multiple Vitamins-Minerals (HAIR SKIN AND NAILS FORMULA) TABS Take  1 tablet by mouth daily.     pantoprazole (PROTONIX) 40 MG tablet Take 1 tablet (40 mg total) by mouth daily. 90 tablet 3   Probiotic Product (PROBIOTIC ADVANCED PO) Take 1 capsule by mouth daily.     Propylene Glycol (SYSTANE BALANCE OP) Place 1 drop into both eyes daily as needed (dry eyes).     ramipril (ALTACE) 10 MG capsule Take 1 capsule (10 mg total) by mouth daily. 90 capsule 3   simethicone (MYLICON) 80 MG chewable tablet Chew 80 mg by mouth every 6 (six) hours as needed for flatulence.     spironolactone (ALDACTONE) 25 MG tablet TAKE 1/2 TABLET BY MOUTH DAILY 45 tablet 3   tiZANidine (ZANAFLEX) 2 MG tablet Take 2 mg by mouth daily as needed for muscle spasms.     VITAMIN D, CHOLECALCIFEROL, PO Take 5,000 Units by mouth daily.     No current facility-administered medications on file prior to visit.    Review of Systems  Constitutional:  Negative for fever.  Respiratory:  Negative for cough, shortness of breath and wheezing.   Cardiovascular:  Negative for chest pain, palpitations and leg swelling.  Musculoskeletal:  Positive for arthralgias and gait problem (poor balance).  Neurological:  Positive for dizziness (occasionally) and light-headedness (occasionally). Negative for headaches.  Objective:   Vitals:   09/04/22 0819  BP: 124/78  Pulse: 64  Resp: 16  Temp: 97.9 F (36.6 C)   BP Readings from Last 3 Encounters:  09/04/22 124/78  07/19/22 120/68  05/05/22 120/78   Wt Readings from Last 3 Encounters:  09/04/22 142 lb (64.4 kg)  07/19/22 138 lb (62.6 kg)  05/05/22 140 lb (63.5 kg)   Body mass index is 22.92 kg/m.    Physical Exam Constitutional:      General: She is not in acute distress.    Appearance: Normal appearance.  HENT:     Head: Normocephalic and atraumatic.  Eyes:     Conjunctiva/sclera: Conjunctivae normal.  Cardiovascular:     Rate and Rhythm: Normal rate and regular rhythm.     Heart sounds: Normal heart sounds. No murmur  heard. Pulmonary:     Effort: Pulmonary effort is normal. No respiratory distress.     Breath sounds: Normal breath sounds. No wheezing.  Musculoskeletal:     Cervical back: Neck supple.     Right lower leg: No edema.     Left lower leg: No edema.  Lymphadenopathy:     Cervical: No cervical adenopathy.  Skin:    General: Skin is warm and dry.     Findings: No rash.  Neurological:     Mental Status: She is alert. Mental status is at baseline.     Gait: Gait abnormal (Gait is unsteady).  Psychiatric:        Mood and Affect: Mood normal.        Behavior: Behavior normal.            Assessment & Plan:    See Problem List for Assessment and Plan of chronic medical problems.

## 2022-09-04 ENCOUNTER — Ambulatory Visit (INDEPENDENT_AMBULATORY_CARE_PROVIDER_SITE_OTHER): Payer: Medicare Other | Admitting: Internal Medicine

## 2022-09-04 ENCOUNTER — Encounter: Payer: Self-pay | Admitting: Internal Medicine

## 2022-09-04 VITALS — BP 124/78 | HR 64 | Temp 97.9°F | Resp 16 | Ht 66.0 in | Wt 142.0 lb

## 2022-09-04 DIAGNOSIS — R2689 Other abnormalities of gait and mobility: Secondary | ICD-10-CM

## 2022-09-04 DIAGNOSIS — I10 Essential (primary) hypertension: Secondary | ICD-10-CM

## 2022-09-04 DIAGNOSIS — M1712 Unilateral primary osteoarthritis, left knee: Secondary | ICD-10-CM

## 2022-09-04 NOTE — Assessment & Plan Note (Addendum)
Chronic High fall risk Doing physical therapy now to help improve balance Has to be very careful, cautious to prevent falls Occasionally uses walker

## 2022-09-04 NOTE — Assessment & Plan Note (Addendum)
Primary osteoarthritis left knee-she estimates symptoms started around January 2022, went to see orthopedics around January 2023 S/p TKR 04/07/2022-day surgery Has done physical therapy Takes tylenol prn - not daily Usually no pain, occasionally has shooting pain Can not stand for long periods of time, difficulty lifting anything heavy

## 2022-09-04 NOTE — Patient Instructions (Addendum)
Medications changes include :   none      Return for follow up as scheduled.

## 2022-09-04 NOTE — Assessment & Plan Note (Addendum)
Chronic Blood pressure was uncontrolled for time.  In the fall, but BP well controlled now Continue metoprolol xl 37.5 mg daily, altac 10 mg daily, spironolacone 12.5 mg daily

## 2022-09-12 DIAGNOSIS — R2689 Other abnormalities of gait and mobility: Secondary | ICD-10-CM | POA: Diagnosis not present

## 2022-09-15 DIAGNOSIS — R2689 Other abnormalities of gait and mobility: Secondary | ICD-10-CM | POA: Diagnosis not present

## 2022-09-18 ENCOUNTER — Other Ambulatory Visit: Payer: Self-pay | Admitting: Internal Medicine

## 2022-09-19 ENCOUNTER — Telehealth: Payer: Self-pay | Admitting: Internal Medicine

## 2022-09-19 DIAGNOSIS — R2689 Other abnormalities of gait and mobility: Secondary | ICD-10-CM | POA: Diagnosis not present

## 2022-09-19 NOTE — Telephone Encounter (Signed)
[employer] called. They received a fax from Dr. Quay Burow, but they do not see Inez Catalina in their system. They are trying to verify if she is filing a claim or something else. Callback at [phone] ext [phone] for Caryl Ada

## 2022-09-19 NOTE — Telephone Encounter (Signed)
Attempted to reach [facility] at number provided but unable to locate by extension

## 2022-09-22 DIAGNOSIS — R2689 Other abnormalities of gait and mobility: Secondary | ICD-10-CM | POA: Diagnosis not present

## 2022-09-26 DIAGNOSIS — R2689 Other abnormalities of gait and mobility: Secondary | ICD-10-CM | POA: Diagnosis not present

## 2022-09-28 DIAGNOSIS — M1712 Unilateral primary osteoarthritis, left knee: Secondary | ICD-10-CM | POA: Diagnosis not present

## 2022-09-29 DIAGNOSIS — R2689 Other abnormalities of gait and mobility: Secondary | ICD-10-CM | POA: Diagnosis not present

## 2022-10-02 DIAGNOSIS — R2689 Other abnormalities of gait and mobility: Secondary | ICD-10-CM | POA: Diagnosis not present

## 2022-10-06 DIAGNOSIS — R2689 Other abnormalities of gait and mobility: Secondary | ICD-10-CM | POA: Diagnosis not present

## 2022-10-10 DIAGNOSIS — R2689 Other abnormalities of gait and mobility: Secondary | ICD-10-CM | POA: Diagnosis not present

## 2022-10-13 DIAGNOSIS — R2689 Other abnormalities of gait and mobility: Secondary | ICD-10-CM | POA: Diagnosis not present

## 2022-10-17 DIAGNOSIS — R2689 Other abnormalities of gait and mobility: Secondary | ICD-10-CM | POA: Diagnosis not present

## 2022-10-20 DIAGNOSIS — R2689 Other abnormalities of gait and mobility: Secondary | ICD-10-CM | POA: Diagnosis not present

## 2022-10-23 DIAGNOSIS — R2689 Other abnormalities of gait and mobility: Secondary | ICD-10-CM | POA: Diagnosis not present

## 2022-10-25 DIAGNOSIS — R2689 Other abnormalities of gait and mobility: Secondary | ICD-10-CM | POA: Diagnosis not present

## 2022-11-05 NOTE — Patient Instructions (Addendum)
      Blood work was ordered.   The lab is on the first floor.    Medications changes include :       A referral was ordered for XXX.     Someone will call you to schedule an appointment.    Return in about 6 months (around 05/09/2023) for follow up.

## 2022-11-05 NOTE — Progress Notes (Unsigned)
Subjective:    Patient ID: Emily Sweeney, female    DOB: 1941-02-06, 82 y.o.   MRN: [id number]     HPI Alilah is here for follow up of her chronic medical problems.  Finished PT - she did make progress.  She is walking her dog, doing PT exercises at home.    Medications and allergies reviewed with patient and updated if appropriate.  Current Outpatient Medications on File Prior to Visit  Medication Sig Dispense Refill   aspirin EC 81 MG tablet Take 1 tablet (81 mg total) by mouth daily. 90 tablet 1   atorvastatin (LIPITOR) 20 MG tablet TAKE 1 TABLET ONCE DAILY. (Patient taking differently: Take 20 mg by mouth daily.) 90 tablet 3   B Complex Vitamins (VITAMIN B COMPLEX PO) Take 1 tablet by mouth daily.     dicyclomine (BENTYL) 10 MG capsule Take 1 capsule (10 mg total) by mouth 3 (three) times daily before meals. 270 capsule 3   fluticasone (FLONASE) 50 MCG/ACT nasal spray Place 1 spray into both nostrils daily as needed for allergies or rhinitis.     metoprolol succinate (TOPROL-XL) 25 MG 24 hr tablet Take 1.5 tablets (37.5 mg total) by mouth daily. 135 tablet 1   Multiple Vitamins-Minerals (HAIR SKIN AND NAILS FORMULA) TABS Take 1 tablet by mouth daily.     pantoprazole (PROTONIX) 40 MG tablet Take 1 tablet (40 mg total) by mouth daily. 90 tablet 3   Probiotic Product (PROBIOTIC ADVANCED PO) Take 1 capsule by mouth daily.     Propylene Glycol (SYSTANE BALANCE OP) Place 1 drop into both eyes daily as needed (dry eyes).     ramipril (ALTACE) 10 MG capsule Take 1 capsule (10 mg total) by mouth daily. 90 capsule 3   simethicone (MYLICON) 80 MG chewable tablet Chew 80 mg by mouth every 6 (six) hours as needed for flatulence.     spironolactone (ALDACTONE) 25 MG tablet TAKE 1/2 TABLET BY MOUTH DAILY 45 tablet 3   tiZANidine (ZANAFLEX) 2 MG tablet Take 2 mg by mouth daily as needed for muscle spasms.     VITAMIN D, CHOLECALCIFEROL, PO Take 5,000 Units by mouth daily.     No  current facility-administered medications on file prior to visit.     Review of Systems  Constitutional:  Negative for fever.  Respiratory:  Positive for cough (mild, intermittent). Negative for shortness of breath and wheezing.   Cardiovascular:  Negative for chest pain, palpitations and leg swelling.  Neurological:  Positive for dizziness (occ) and light-headedness (occ). Negative for headaches.       Objective:   Vitals:   11/06/22 0945  BP: 130/74  Pulse: 80  Temp: 98.1 F (36.7 C)  SpO2: 95%   BP Readings from Last 3 Encounters:  11/06/22 130/74  09/04/22 124/78  07/19/22 120/68   Wt Readings from Last 3 Encounters:  11/06/22 143 lb (64.9 kg)  09/04/22 142 lb (64.4 kg)  07/19/22 138 lb (62.6 kg)   Body mass index is 23.08 kg/m.    Physical Exam Constitutional:      General: She is not in acute distress.    Appearance: Normal appearance.  HENT:     Head: Normocephalic and atraumatic.  Eyes:     Conjunctiva/sclera: Conjunctivae normal.  Cardiovascular:     Rate and Rhythm: Normal rate and regular rhythm.     Heart sounds: Normal heart sounds.  Pulmonary:     Effort: Pulmonary effort is  normal. No respiratory distress.     Breath sounds: Normal breath sounds. No wheezing.  Musculoskeletal:     Cervical back: Neck supple.     Right lower leg: No edema.     Left lower leg: No edema.  Lymphadenopathy:     Cervical: No cervical adenopathy.  Skin:    General: Skin is warm and dry.     Findings: No rash.  Neurological:     Mental Status: She is alert. Mental status is at baseline.  Psychiatric:        Mood and Affect: Mood normal.        Behavior: Behavior normal.        Lab Results  Component Value Date   WBC 7.2 05/05/2022   HGB 11.7 (L) 05/05/2022   HCT 35.2 (L) 05/05/2022   PLT 286.0 05/05/2022   GLUCOSE 124 (H) 05/16/2022   CHOL 137 05/05/2022   TRIG 51.0 05/05/2022   HDL 70.70 05/05/2022   LDLCALC 56 05/05/2022   ALT 11 05/05/2022    AST 15 05/05/2022   NA 130 (L) 05/16/2022   K 4.6 05/16/2022   CL 96 05/16/2022   CREATININE 1.04 05/16/2022   BUN 16 05/16/2022   CO2 24 05/16/2022   TSH 1.566 03/13/2021   INR 1.0 06/09/2015   HGBA1C 6.2 05/05/2022   MICROALBUR 1.1 01/02/2022     Assessment & Plan:    See Problem List for Assessment and Plan of chronic medical problems.

## 2022-11-06 ENCOUNTER — Ambulatory Visit (INDEPENDENT_AMBULATORY_CARE_PROVIDER_SITE_OTHER): Payer: Medicare Other | Admitting: Internal Medicine

## 2022-11-06 VITALS — BP 130/74 | HR 80 | Temp 98.1°F | Ht 66.0 in | Wt 143.0 lb

## 2022-11-06 DIAGNOSIS — E785 Hyperlipidemia, unspecified: Secondary | ICD-10-CM

## 2022-11-06 DIAGNOSIS — E871 Hypo-osmolality and hyponatremia: Secondary | ICD-10-CM

## 2022-11-06 DIAGNOSIS — E119 Type 2 diabetes mellitus without complications: Secondary | ICD-10-CM | POA: Diagnosis not present

## 2022-11-06 DIAGNOSIS — D509 Iron deficiency anemia, unspecified: Secondary | ICD-10-CM

## 2022-11-06 DIAGNOSIS — I10 Essential (primary) hypertension: Secondary | ICD-10-CM

## 2022-11-06 NOTE — Assessment & Plan Note (Signed)
Chronic CMP 

## 2022-11-06 NOTE — Assessment & Plan Note (Signed)
Chronic Regular exercise and healthy diet encouraged Check lipid panel, cmp, tsh Continue atorvastatin 20 mg daily

## 2022-11-06 NOTE — Assessment & Plan Note (Signed)
Chronic History of iron deficiency anemia Check CBC

## 2022-11-06 NOTE — Assessment & Plan Note (Signed)
Chronic   Lab Results  Component Value Date   HGBA1C 6.2 05/05/2022   Sugars well controlled Check A1c Continue lifestyle control Stressed regular exercise, diabetic diet

## 2022-11-06 NOTE — Assessment & Plan Note (Addendum)
Chronic Blood pressure well controlled Cmp, cbc Continue metoprolol xl 37.5 mg daily, altace 10 mg daily, spironolacone 12.5 mg daily

## 2022-11-09 LAB — CBC WITH DIFFERENTIAL/PLATELET
Basophils Absolute: 0 10*3/uL (ref 0.0–0.1)
Basophils Relative: 0.5 % (ref 0.0–3.0)
Eosinophils Absolute: 0.1 10*3/uL (ref 0.0–0.7)
Eosinophils Relative: 0.9 % (ref 0.0–5.0)
HCT: 36.8 % (ref 36.0–46.0)
Hemoglobin: 12 g/dL (ref 12.0–15.0)
Lymphocytes Relative: 23.7 % (ref 12.0–46.0)
Lymphs Abs: 1.9 10*3/uL (ref 0.7–4.0)
MCHC: 32.5 g/dL (ref 30.0–36.0)
MCV: 87.7 fl (ref 78.0–100.0)
Monocytes Absolute: 0.7 10*3/uL (ref 0.1–1.0)
Monocytes Relative: 8.3 % (ref 3.0–12.0)
Neutro Abs: 5.3 10*3/uL (ref 1.4–7.7)
Neutrophils Relative %: 66.6 % (ref 43.0–77.0)
Platelets: 220 10*3/uL (ref 150.0–400.0)
RBC: 4.2 Mil/uL (ref 3.87–5.11)
RDW: 15.5 % (ref 11.5–15.5)
WBC: 8 10*3/uL (ref 4.0–10.5)

## 2022-11-09 LAB — COMPREHENSIVE METABOLIC PANEL
ALT: 10 U/L (ref 0–35)
AST: 16 U/L (ref 0–37)
Albumin: 4.3 g/dL (ref 3.5–5.2)
Alkaline Phosphatase: 77 U/L (ref 39–117)
BUN: 21 mg/dL (ref 6–23)
CO2: 25 mEq/L (ref 19–32)
Calcium: 9.4 mg/dL (ref 8.4–10.5)
Chloride: 103 mEq/L (ref 96–112)
Creatinine, Ser: 1.1 mg/dL (ref 0.40–1.20)
GFR: 46.44 mL/min — ABNORMAL LOW (ref >60.00)
Glucose, Bld: 125 mg/dL — ABNORMAL HIGH (ref 70–99)
Potassium: 5 mEq/L (ref 3.5–5.1)
Sodium: 136 mEq/L (ref 135–145)
Total Bilirubin: 0.6 mg/dL (ref 0.2–1.2)
Total Protein: 7 g/dL (ref 6.0–8.3)

## 2022-11-09 LAB — LIPID PANEL
Cholesterol: 173 mg/dL (ref 0–200)
HDL: 94.1 mg/dL (ref >39.00)
LDL Cholesterol: 63 mg/dL (ref 0–99)
NonHDL: 78.7
Total CHOL/HDL Ratio: 2
Triglycerides: 80 mg/dL (ref 0.0–149.0)
VLDL: 16 mg/dL (ref 0.0–40.0)

## 2022-11-09 LAB — HEMOGLOBIN A1C: Hgb A1c MFr Bld: 6.2 % (ref 4.6–6.5)

## 2022-11-09 LAB — TSH: TSH: 2.84 u[IU]/mL (ref 0.35–5.50)

## 2022-12-03 ENCOUNTER — Encounter: Payer: Self-pay | Admitting: Internal Medicine

## 2022-12-03 NOTE — Progress Notes (Unsigned)
Virtual Visit via Video Note  I connected with Emily Sweeney on 12/03/22 at  1:40 PM EDT by a video enabled telemedicine application.  We were able to see each other during the visit, but I was not able to hear her and I did have to call her on the phone while we looked at each other through the video.  I verified that I am speaking with the correct person using two identifiers.   I discussed the limitations of evaluation and management by telemedicine and the availability of in person appointments. The patient expressed understanding and agreed to proceed.  Present for the visit:  Myself, Dr Cheryll Cockayne, Emily Sweeney.  The patient is currently at home and I am in the office.    No referring provider.    History of Present Illness: This is an acute visit for back pain  Acute left lower back pain - no injury/fall.  It started Thursday night.  Used lidocaine patches - helped a little.  Has taken aleve and used heat - the heat pad has helped most.  It radiates across the lower back to the right.  No leg pain.  Muscle relaxer did not help.  Worse with movement.       Social History   Socioeconomic History   Marital status: Divorced    Spouse name: Not on file   Number of children: Not on file   Years of education: Not on file   Highest education level: Master's degree (e.g., MA, MS, MEng, MEd, MSW, MBA)  Occupational History   Occupation: psychologist  Tobacco Use   Smoking status: Former    Types: Cigarettes    Quit date: 07/04/1991    Years since quitting: 31.4   Smokeless tobacco: Never  Vaping Use   Vaping Use: Never used  Substance and Sexual Activity   Alcohol use: Yes    Alcohol/week: 0.0 standard drinks of alcohol    Comment: glass wine 4-5 days a week   Drug use: No   Sexual activity: Not Currently  Other Topics Concern   Not on file  Social History Narrative   Not on file   Social Determinants of Health   Financial Resource Strain: Low Risk  (11/05/2022)    Overall Financial Resource Strain (CARDIA)    Difficulty of Paying Living Expenses: Not very hard  Food Insecurity: No Food Insecurity (11/05/2022)   Hunger Vital Sign    Worried About Running Out of Food in the Last Year: Never true    Ran Out of Food in the Last Year: Never true  Transportation Needs: No Transportation Needs (11/05/2022)   PRAPARE - Administrator, Civil Service (Medical): No    Lack of Transportation (Non-Medical): No  Physical Activity: Insufficiently Active (11/05/2022)   Exercise Vital Sign    Days of Exercise per Week: 4 days    Minutes of Exercise per Session: 30 min  Stress: No Stress Concern Present (11/05/2022)   [employer] of Occupational Health - Occupational Stress Questionnaire    Feeling of Stress : Not at all  Social Connections: Moderately Isolated (11/05/2022)   Social Connection and Isolation Panel [NHANES]    Frequency of Communication with Friends and Family: More than three times a week    Frequency of Social Gatherings with Friends and Family: More than three times a week    Attends Religious Services: Never    Database administrator or Organizations: Yes    Attends Club or  Organization Meetings: 1 to 4 times per year    Marital Status: Divorced     Observations/Objective: Appears well in NAD   Assessment and Plan:  See Problem List for Assessment and Plan of chronic medical problems.   Follow Up Instructions:    I discussed the assessment and treatment plan with the patient. The patient was provided an opportunity to ask questions and all were answered. The patient agreed with the plan and demonstrated an understanding of the instructions.   The patient was advised to call back or seek an in-person evaluation if the symptoms worsen or if the condition fails to improve as anticipated.    Pincus Sanes, MD

## 2022-12-04 ENCOUNTER — Telehealth (INDEPENDENT_AMBULATORY_CARE_PROVIDER_SITE_OTHER): Payer: Medicare Other | Admitting: Internal Medicine

## 2022-12-04 ENCOUNTER — Ambulatory Visit (INDEPENDENT_AMBULATORY_CARE_PROVIDER_SITE_OTHER): Payer: Medicare Other

## 2022-12-04 DIAGNOSIS — M545 Low back pain, unspecified: Secondary | ICD-10-CM

## 2022-12-04 DIAGNOSIS — M4126 Other idiopathic scoliosis, lumbar region: Secondary | ICD-10-CM | POA: Diagnosis not present

## 2022-12-04 DIAGNOSIS — M5136 Other intervertebral disc degeneration, lumbar region: Secondary | ICD-10-CM | POA: Diagnosis not present

## 2022-12-04 MED ORDER — METHYLPREDNISOLONE 4 MG PO TBPK: ORAL_TABLET | ORAL | 0 refills | Status: DC

## 2022-12-04 NOTE — Assessment & Plan Note (Signed)
Acute Started several days ago for no obvious reason-no injury or fall Pain is localized in the left lower back and radiating across towards the right.  No leg pain.  No numbness/tingling, muscle spasms Worse with movement Muscle relaxer, lidocaine patch and Aleve did not help or help minimally Heat did not seem to help Has osteoporosis-will get an x-ray today History of lumbar spondylosis and radiculopathy Medrol Dosepak to see if that helps with inflammation If no improvement may need to see sports medicine

## 2022-12-15 ENCOUNTER — Other Ambulatory Visit: Payer: Self-pay | Admitting: Gastroenterology

## 2023-01-17 ENCOUNTER — Telehealth: Payer: Medicare Other

## 2023-02-26 ENCOUNTER — Other Ambulatory Visit: Payer: Self-pay | Admitting: Internal Medicine

## 2023-02-28 ENCOUNTER — Other Ambulatory Visit: Payer: Self-pay | Admitting: Internal Medicine

## 2023-03-02 ENCOUNTER — Other Ambulatory Visit (HOSPITAL_COMMUNITY): Payer: Self-pay

## 2023-03-18 ENCOUNTER — Other Ambulatory Visit: Payer: Self-pay | Admitting: Internal Medicine

## 2023-05-07 ENCOUNTER — Other Ambulatory Visit: Payer: Self-pay | Admitting: Internal Medicine

## 2023-08-29 ENCOUNTER — Telehealth: Payer: Self-pay | Admitting: Gastroenterology

## 2023-08-29 DIAGNOSIS — K219 Gastro-esophageal reflux disease without esophagitis: Secondary | ICD-10-CM

## 2023-08-29 MED ORDER — PANTOPRAZOLE SODIUM 40 MG PO TBEC: 40.0000 mg | DELAYED_RELEASE_TABLET | Freq: Every day | ORAL | 3 refills | Status: DC

## 2023-08-29 MED ORDER — DICYCLOMINE HCL 10 MG PO CAPS: ORAL_CAPSULE | ORAL | 11 refills | Status: AC

## 2023-08-29 NOTE — Telephone Encounter (Signed)
 Sure.  Emily Sweeney to refill for 1 year

## 2023-08-29 NOTE — Telephone Encounter (Signed)
 Prescription sent to patient's pharmacy.

## 2023-08-29 NOTE — Telephone Encounter (Signed)
 Dr. Sandrea Cruel patient is requesting a refill of pantoprazole  and dicyclomine . Dr. Elvin Hammer, you are DOD. Please advise if patient can have refill.

## 2023-08-29 NOTE — Telephone Encounter (Signed)
 Pharmacy rep is requesting a refill for Protonix  and Bentyl 

## 2023-09-11 ENCOUNTER — Ambulatory Visit: Payer: Medicare Other

## 2023-09-16 ENCOUNTER — Other Ambulatory Visit: Payer: Self-pay | Admitting: Internal Medicine

## 2023-09-17 ENCOUNTER — Telehealth: Payer: Self-pay | Admitting: Internal Medicine

## 2023-09-17 NOTE — Telephone Encounter (Signed)
PATIENT IS CALLING IN REGARDING HER ANNUAL WELLNESS VISIT SHE WOULD LIKE TO KNOW IF SHE CAN GET HER ANNUAL WELLNESS DONE THEN ARE DOES SHE NEED ANOTHER APPT BEFORE HER VISIT WITH DR BURNS

## 2023-09-23 ENCOUNTER — Encounter: Payer: Self-pay | Admitting: Internal Medicine

## 2023-09-23 NOTE — Progress Notes (Signed)
Virtual Visit via Video Note  I connected with Emily Sweeney on 09/23/23 at  9:10 AM EST by a video enabled telemedicine application and verified that I am speaking with the correct person using two identifiers.   I discussed the limitations of evaluation and management by telemedicine and the availability of in person appointments. The patient expressed understanding and agreed to proceed.  Present for the visit:  Myself, Dr Cheryll Cockayne, Emily Sweeney.  The patient is currently at home and I am in the office.    No referring provider.    History of Present Illness: This visit is for follow-up of her chronic medical problems.  No falls.  Some exercise.    BP 126/83  Back is much better  No concerns.    Review of Systems  Constitutional:  Negative for fever.  Respiratory:  Positive for cough (mild - resolved with mucinex). Negative for shortness of breath and wheezing.   Cardiovascular:  Negative for chest pain, palpitations and leg swelling.  Gastrointestinal:  Negative for heartburn (gerd).  Neurological:  Positive for dizziness (a little). Negative for headaches.     Social History   Socioeconomic History   Marital status: Divorced    Spouse name: Not on file   Number of children: Not on file   Years of education: Not on file   Highest education level: Master's degree (e.g., MA, MS, MEng, MEd, MSW, MBA)  Occupational History   Occupation: psychologist  Tobacco Use   Smoking status: Former    Current packs/day: 0.00    Types: Cigarettes    Quit date: 07/04/1991    Years since quitting: 32.2   Smokeless tobacco: Never  Vaping Use   Vaping status: Never Used  Substance and Sexual Activity   Alcohol use: Yes    Alcohol/week: 0.0 standard drinks of alcohol    Comment: glass wine 4-5 days a week   Drug use: No   Sexual activity: Not Currently  Other Topics Concern   Not on file  Social History Narrative   Not on file   Social Drivers of Health   Financial  Resource Strain: Low Risk  (11/05/2022)   Overall Financial Resource Strain (CARDIA)    Difficulty of Paying Living Expenses: Not very hard  Food Insecurity: No Food Insecurity (11/05/2022)   Hunger Vital Sign    Worried About Running Out of Food in the Last Year: Never true    Ran Out of Food in the Last Year: Never true  Transportation Needs: No Transportation Needs (11/05/2022)   PRAPARE - Administrator, Civil Service (Medical): No    Lack of Transportation (Non-Medical): No  Physical Activity: Insufficiently Active (11/05/2022)   Exercise Vital Sign    Days of Exercise per Week: 4 days    Minutes of Exercise per Session: 30 min  Stress: No Stress Concern Present (11/05/2022)   [employer] of Occupational Health - Occupational Stress Questionnaire    Feeling of Stress : Not at all  Social Connections: Moderately Isolated (11/05/2022)   Social Connection and Isolation Panel [NHANES]    Frequency of Communication with Friends and Family: More than three times a week    Frequency of Social Gatherings with Friends and Family: More than three times a week    Attends Religious Services: Never    Database administrator or Organizations: Yes    Attends Banker Meetings: 1 to 4 times per year    Marital Status: Divorced  Observations/Objective: Appears well in NAD Breathing normally  Assessment and Plan:  See Problem List for Assessment and Plan of chronic medical problems.   Follow Up Instructions:    I discussed the assessment and treatment plan with the patient. The patient was provided an opportunity to ask questions and all were answered. The patient agreed with the plan and demonstrated an understanding of the instructions.   The patient was advised to call back or seek an in-person evaluation if the symptoms worsen or if the condition fails to improve as anticipated.    Pincus Sanes, MD

## 2023-09-26 ENCOUNTER — Telehealth (INDEPENDENT_AMBULATORY_CARE_PROVIDER_SITE_OTHER): Payer: Medicare Other | Admitting: Internal Medicine

## 2023-09-26 DIAGNOSIS — E119 Type 2 diabetes mellitus without complications: Secondary | ICD-10-CM | POA: Diagnosis not present

## 2023-09-26 DIAGNOSIS — M810 Age-related osteoporosis without current pathological fracture: Secondary | ICD-10-CM | POA: Diagnosis not present

## 2023-09-26 DIAGNOSIS — Z23 Encounter for immunization: Secondary | ICD-10-CM | POA: Diagnosis not present

## 2023-09-26 DIAGNOSIS — I10 Essential (primary) hypertension: Secondary | ICD-10-CM | POA: Diagnosis not present

## 2023-09-26 DIAGNOSIS — E785 Hyperlipidemia, unspecified: Secondary | ICD-10-CM

## 2023-09-26 DIAGNOSIS — E538 Deficiency of other specified B group vitamins: Secondary | ICD-10-CM

## 2023-09-26 DIAGNOSIS — E559 Vitamin D deficiency, unspecified: Secondary | ICD-10-CM

## 2023-09-26 NOTE — Assessment & Plan Note (Signed)
Chronic Regular exercise and healthy diet encouraged Check lipid panel, cmp Continue atorvastatin 20 mg daily

## 2023-09-26 NOTE — Assessment & Plan Note (Signed)
Chronic Taking vitamin D daily Check vitamin D level

## 2023-09-26 NOTE — Assessment & Plan Note (Signed)
Chronic Blood pressure well controlled at home Cmp, cbc Continue metoprolol xl 37.5 mg daily, altace 10 mg daily, spironolacone 12.5 mg daily

## 2023-09-26 NOTE — Assessment & Plan Note (Signed)
Chronic DEXA due-ordered Currently not on treatment Continue calcium and vitamin D daily History of falls-fall prevention discussed Continue regular exercise Check vitamin D level

## 2023-09-26 NOTE — Assessment & Plan Note (Signed)
Chronic  Lab Results  Component Value Date   HGBA1C 6.2 11/09/2022   Sugars well controlled Check A1c, urine microalbumin Continue lifestyle control Stressed regular exercise, diabetic diet

## 2023-09-26 NOTE — Assessment & Plan Note (Signed)
Chronic Continue B12 supplementation Check B12 level

## 2023-10-02 ENCOUNTER — Other Ambulatory Visit: Payer: Medicare Other

## 2023-10-22 ENCOUNTER — Ambulatory Visit
Admission: RE | Admit: 2023-10-22 | Discharge: 2023-10-22 | Disposition: A | Discharge status: Home / Self Care | Payer: Medicare Other | Source: Ambulatory Visit | Attending: Internal Medicine | Admitting: Internal Medicine

## 2023-10-22 DIAGNOSIS — M810 Age-related osteoporosis without current pathological fracture: Secondary | ICD-10-CM

## 2023-10-29 ENCOUNTER — Ambulatory Visit (INDEPENDENT_AMBULATORY_CARE_PROVIDER_SITE_OTHER): Payer: Medicare Other

## 2023-10-29 VITALS — Ht 66.5 in | Wt 145.0 lb

## 2023-10-29 DIAGNOSIS — Z0100 Encounter for examination of eyes and vision without abnormal findings: Secondary | ICD-10-CM

## 2023-10-29 DIAGNOSIS — Z0000 Encounter for general adult medical examination without abnormal findings: Secondary | ICD-10-CM

## 2023-10-29 NOTE — Patient Instructions (Addendum)
 [name] , Thank you for taking time to come for your Medicare Wellness Visit. I appreciate your ongoing commitment to your health goals. Please review the following plan we discussed and let me know if I can assist you in the future.   Referrals/Orders/Follow-Ups/Clinician Recommendations: Aim for 30 minutes of exercise or brisk walking, 6-8 glasses of water, and 5 servings of fruits and vegetables each day. Referral to Dr Randon Goldsmith for a routine eye exam.    This is a list of the screening recommended for you and due dates:  Health Maintenance  Topic Date Due   Zoster (Shingles) Vaccine (1 of 2) 04/06/1960   Eye exam for diabetics  06/01/2019   Complete foot exam   08/12/2022   Yearly kidney health urinalysis for diabetes  01/03/2023   COVID-19 Vaccine (5 - 2024-25 season) 04/15/2023   Hemoglobin A1C  05/12/2023   Yearly kidney function blood test for diabetes  11/09/2023   DEXA scan (bone density measurement)  11/05/2023   Medicare Annual Wellness Visit  10/28/2024   DTaP/Tdap/Td vaccine (3 - Td or Tdap) 05/26/2027   Pneumonia Vaccine  Completed   Flu Shot  Completed   HPV Vaccine  Aged Out   Colon Cancer Screening  Discontinued    Advanced directives: (Copy Requested) Please bring a copy of your health care power of attorney and living will to the office to be added to your chart at your convenience. You can mail to [facility] [address] [place], [place] [address] or email to [email or web address]  Next Medicare Annual Wellness Visit scheduled for next year: Yes

## 2023-10-29 NOTE — Progress Notes (Signed)
 Subjective:   Emily Sweeney is a 83 y.o. who presents for a Medicare Wellness preventive visit.  Visit Complete: Virtual I connected with  Ozella Rocks on 10/29/23 by a audio enabled telemedicine application and verified that I am speaking with the correct person using two identifiers.  Patient Location: Home  Provider Location: Office/Clinic  I discussed the limitations of evaluation and management by telemedicine. The patient expressed understanding and agreed to proceed.  Vital Signs: Because this visit was a virtual/telehealth visit, some criteria may be missing or patient reported. Any vitals not documented were not able to be obtained and vitals that have been documented are patient reported.  VideoDeclined- This patient declined Librarian, academic. Therefore the visit was completed with audio only.  Persons Participating in Visit: Patient.  AWV Questionnaire: Yes: Patient Medicare AWV questionnaire was completed by the patient on (partial) 10/22/2023; I have confirmed that all information answered by patient is correct and no changes since this date.  Cardiac Risk Factors include: advanced age (>2men, >29 women);hypertension;diabetes mellitus;dyslipidemia     Objective:    Today's Vitals   10/29/23 1333  Weight: 145 lb (65.8 kg)  Height: 5' 6.5" (1.689 m)   Body mass index is 23.05 kg/m.     10/29/2023    1:32 PM 04/29/2022   10:27 AM 02/24/2022    4:05 PM 02/21/2021    8:17 AM 12/17/2019    9:04 AM 12/16/2018    8:41 AM 06/13/2018    2:01 PM  Advanced Directives  Does Patient Have a Medical Advance Directive? Yes Yes Yes No No Yes Yes  Type of Estate agent of [place];Living will  Living will;Healthcare Power of Attorney   Living will Living will  Does patient want to make changes to medical advance directive?   No - Patient declined No - Patient declined     Copy of Healthcare Power of Attorney in Chart? No -  copy requested  No - copy requested      Would patient like information on creating a medical advance directive?     No - Patient declined      Current Medications (verified) Outpatient Encounter Medications as of 10/29/2023  Medication Sig   aspirin EC 81 MG tablet Take 1 tablet (81 mg total) by mouth daily.   atorvastatin (LIPITOR) 20 MG tablet TAKE 1 TABLET ONCE DAILY.   dicyclomine (BENTYL) 10 MG capsule TAKE ONE CAPSULE BY MOUTH THREE TIMES DAILY BEFORE MEALS   fluticasone (FLONASE) 50 MCG/ACT nasal spray Place 1 spray into both nostrils daily as needed for allergies or rhinitis.   metoprolol succinate (TOPROL-XL) 25 MG 24 hr tablet TAKE 1 AND 1/2 TABLETS BY MOUTH DAILY   Multiple Vitamins-Minerals (HAIR SKIN AND NAILS FORMULA) TABS Take 1 tablet by mouth daily.   pantoprazole (PROTONIX) 40 MG tablet Take 1 tablet (40 mg total) by mouth daily.   Probiotic Product (PROBIOTIC ADVANCED PO) Take 1 capsule by mouth daily.   Propylene Glycol (SYSTANE BALANCE OP) Place 1 drop into both eyes daily as needed (dry eyes).   ramipril (ALTACE) 10 MG capsule Take 1 capsule (10 mg total) by mouth daily.   simethicone (MYLICON) 80 MG chewable tablet Chew 80 mg by mouth every 6 (six) hours as needed for flatulence.   spironolactone (ALDACTONE) 25 MG tablet TAKE 1/2 TABLET BY MOUTH DAILY   No facility-administered encounter medications on file as of 10/29/2023.    Allergies (verified) Macrobid [nitrofurantoin macrocrystal], Norvasc [  amlodipine besylate], Ceftin [cefuroxime axetil], Ciprofloxacin, Penicillins, and Sulfonamide derivatives   History: Past Medical History:  Diagnosis Date   Anemia    Arthritis of left knee 2022   Blood transfusion without reported diagnosis    Diverticulosis of colon    Fracture, finger 2023   GERD (gastroesophageal reflux disease)    Hearing loss    right ear, and tinnitus   Hiatal hernia    Hyperlipidemia    Hypertension    Hyponatremia 2022   Tubular adenoma  03/05/2015   Polyp, and Benign lymphoid polyp.   UTI (urinary tract infection)    Past Surgical History:  Procedure Laterality Date   APPENDECTOMY     BREAST BIOPSY     benign   CATARACT EXTRACTION, BILATERAL  2019, 2020   COCHLEAR IMPLANT     CRANIECTOMY FOR EXCISION OF ACOUSTIC NEUROMA  2009   KNEE ARTHROPLASTY  03/2022   NASAL SEPTUM SURGERY     TONSILLECTOMY     UPPER GASTROINTESTINAL ENDOSCOPY  06/02/2020   Family History  Problem Relation Age of Onset   Dementia Mother 28   Sudden death Father 49       MI   Coronary artery disease Father    Heart disease Father    Alcohol abuse Brother    Breast cancer Daughter    Colon cancer Neg Hx    Esophageal cancer Neg Hx    Social History   Socioeconomic History   Marital status: Divorced    Spouse name: Not on file   Number of children: Not on file   Years of education: Not on file   Highest education level: Master's degree (e.g., MA, MS, MEng, MEd, MSW, MBA)  Occupational History   Occupation: psychologist  Tobacco Use   Smoking status: Former    Current packs/day: 0.00    Types: Cigarettes    Quit date: 07/04/1991    Years since quitting: 32.3    Passive exposure: Past   Smokeless tobacco: Never  Vaping Use   Vaping status: Never Used  Substance and Sexual Activity   Alcohol use: Yes    Alcohol/week: 0.0 standard drinks of alcohol    Comment: glass wine 4-5 days a week   Drug use: No   Sexual activity: Not Currently  Other Topics Concern   Not on file  Social History Narrative   Not on file   Social Drivers of Health   Financial Resource Strain: Medium Risk (10/29/2023)   Overall Financial Resource Strain (CARDIA)    Difficulty of Paying Living Expenses: Somewhat hard  Food Insecurity: No Food Insecurity (10/29/2023)   Hunger Vital Sign    Worried About Running Out of Food in the Last Year: Never true    Ran Out of Food in the Last Year: Never true  Transportation Needs: No Transportation Needs  (10/29/2023)   PRAPARE - Administrator, Civil Service (Medical): No    Lack of Transportation (Non-Medical): No  Physical Activity: Sufficiently Active (10/29/2023)   Exercise Vital Sign    Days of Exercise per Week: 5 days    Minutes of Exercise per Session: 40 min  Stress: No Stress Concern Present (10/29/2023)   [employer] of Occupational Health - Occupational Stress Questionnaire    Feeling of Stress : Only a little  Social Connections: Moderately Isolated (10/29/2023)   Social Connection and Isolation Panel [NHANES]    Frequency of Communication with Friends and Family: More than three times a week  Frequency of Social Gatherings with Friends and Family: More than three times a week    Attends Religious Services: Never    Database administrator or Organizations: Yes    Attends Banker Meetings: Never    Marital Status: Divorced    Tobacco Counseling Counseling given: No    Clinical Intake:  Pre-visit preparation completed: Yes        BMI - recorded: 23.05 Nutritional Status: BMI of 19-24  Normal Nutritional Risks: None Diabetes: No  How often do you need to have someone help you when you read instructions, pamphlets, or other written materials from your doctor or pharmacy?: 1 - Never  Interpreter Needed?: No  Information entered by :: Hassell Halim, CMA   Activities of Daily Living     10/29/2023    1:37 PM  In your present state of health, do you have any difficulty performing the following activities:  Hearing? 0  Vision? 0  Difficulty concentrating or making decisions? 0  Walking or climbing stairs? 0  Dressing or bathing? 0  Doing errands, shopping? 0  Preparing Food and eating ? N  Using the Toilet? N  In the past six months, have you accidently leaked urine? N  Do you have problems with loss of bowel control? N  Managing your Medications? N  Managing your Finances? N  Housekeeping or managing your Housekeeping? N     Patient Care Team: Pincus Sanes, MD as PCP - General (Internal Medicine) Antony Contras, MD (Ophthalmology) Hilaria Ota, MD as Referring Physician (Otolaryngology) Tat, Octaviano Batty, DO as Consulting Physician (Neurology) Kathyrn Sheriff, [facility] (Inactive) as Pharmacist (Pharmacist)  Indicate any recent Medical Services you may have received from other than [facility] providers in the past year (date may be approximate).     Assessment:   This is a routine wellness examination for [place].  Hearing/Vision screen Hearing Screening - Comments:: Denies hearing difficulties   Vision Screening - Comments:: Wears rx glasses - up to date with routine eye exams with Dr Randon Goldsmith   Goals Addressed               This Visit's Progress     Patient Stated (pt-stated)        Patient stated she wants to stay active and maintain life.       Depression Screen     10/29/2023    1:43 PM 11/06/2022    9:51 AM 09/04/2022    8:42 AM 02/24/2022    4:07 PM 02/08/2022   10:33 AM 02/21/2021    8:40 AM 12/17/2019    9:05 AM  PHQ 2/9 Scores  PHQ - 2 Score 0 0 0 0 0 0 0  PHQ- 9 Score 0 0   1      Fall Risk     10/29/2023    1:38 PM 11/06/2022    9:51 AM 09/04/2022    8:42 AM 02/24/2022    4:07 PM 02/08/2022   10:33 AM  Fall Risk   Falls in the past year? 0 0 0 1 Exclusion - non ambulatory  Number falls in past yr: 0 0 0 0   Injury with Fall? 0 0 0 0   Risk for fall due to : No Fall Risks No Fall Risks No Fall Risks No Fall Risks   Follow up Falls prevention discussed;Falls evaluation completed Falls evaluation completed Falls evaluation completed Falls evaluation completed     MEDICARE RISK AT HOME:  Medicare Risk at Home Any stairs in or around the home?: Yes If so, are there any without handrails?: No Home free of loose throw rugs in walkways, pet beds, electrical cords, etc?: Yes Adequate lighting in your home to reduce risk of falls?: Yes Life alert?: No Use of a cane, walker or  w/c?: Yes (walker) Grab bars in the bathroom?: Yes Shower chair or bench in shower?: Yes Elevated toilet seat or a handicapped toilet?: No  TIMED UP AND GO:  Was the test performed?  No  Cognitive Function: 6CIT completed    12/14/2017   10:43 AM  MMSE - Mini Mental State Exam  Not completed: Refused        10/29/2023    1:39 PM 02/24/2022    4:21 PM  6CIT Screen  What Year? 0 points 0 points  What month? 0 points 0 points  What time? 0 points 0 points  Count back from 20 0 points 0 points  Months in reverse 0 points 0 points  Repeat phrase 0 points 0 points  Total Score 0 points 0 points    Immunizations Immunization History  Administered Date(s) Administered   Fluad Quad(high Dose 65+) 06/06/2019, 05/28/2020, 04/27/2021, 05/05/2022   Influenza Split 07/04/2011, 09/15/2011   Influenza Whole 05/14/2008, 06/10/2010   Influenza, High Dose Seasonal PF 06/18/2013, 05/02/2016, 05/25/2017, 07/04/2018   Influenza,inj,Quad PF,6+ Mos 09/23/2014, 06/09/2015   Influenza-Unspecified 09/29/2023   PFIZER Comirnaty(Gray Top)Covid-19 Tri-Sucrose Vaccine 03/15/2021   PFIZER(Purple Top)SARS-COV-2 Vaccination 08/27/2019, 09/16/2019, 05/18/2020   Pneumococcal Conjugate-13 10/16/2014   Pneumococcal Polysaccharide-23 10/21/2008, 10/14/2015   Td 11/13/2006   Tdap 05/25/2017   Zoster, Live 12/28/2006    Screening Tests Health Maintenance  Topic Date Due   Zoster Vaccines- Shingrix (1 of 2) 04/06/1960   OPHTHALMOLOGY EXAM  06/01/2019   FOOT EXAM  08/12/2022   Diabetic kidney evaluation - Urine ACR  01/03/2023   COVID-19 Vaccine (5 - 2024-25 season) 04/15/2023   HEMOGLOBIN A1C  05/12/2023   Diabetic kidney evaluation - eGFR measurement  11/09/2023   DEXA SCAN  11/05/2023   Medicare Annual Wellness (AWV)  10/28/2024   DTaP/Tdap/Td (3 - Td or Tdap) 05/26/2027   Pneumonia Vaccine 70+ Years old  Completed   INFLUENZA VACCINE  Completed   HPV VACCINES  Aged Out   Colonoscopy   Discontinued    Health Maintenance  Health Maintenance Due  Topic Date Due   Zoster Vaccines- Shingrix (1 of 2) 04/06/1960   OPHTHALMOLOGY EXAM  06/01/2019   FOOT EXAM  08/12/2022   Diabetic kidney evaluation - Urine ACR  01/03/2023   COVID-19 Vaccine (5 - 2024-25 season) 04/15/2023   HEMOGLOBIN A1C  05/12/2023   Diabetic kidney evaluation - eGFR measurement  11/09/2023   Health Maintenance Items Addressed: 10/29/2023   Additional Screening:  Vision Screening: Recommended annual ophthalmology exams for early detection of glaucoma and other disorders of the eye.  Dental Screening: Recommended annual dental exams for proper oral hygiene  Community Resource Referral / Chronic Care Management: CRR required this visit?  No   CCM required this visit?  No     Plan:     I have personally reviewed and noted the following in the patient's chart:   Medical and social history Use of alcohol, tobacco or illicit drugs  Current medications and supplements including opioid prescriptions. Patient is not currently taking opioid prescriptions. Functional ability and status Nutritional status Physical activity Advanced directives List of other physicians Hospitalizations, surgeries, and ER visits  in previous 12 months Vitals Screenings to include cognitive, depression, and falls Referrals and appointments  In addition, I have reviewed and discussed with patient certain preventive protocols, quality metrics, and best practice recommendations. A written personalized care plan for preventive services as well as general preventive health recommendations were provided to patient.     Darreld Mclean, CMA   10/29/2023   After Visit Summary: (MyChart) Due to this being a telephonic visit, the after visit summary with patients personalized plan was offered to patient via MyChart   Notes: Nothing significant to report at this time.

## 2023-11-07 ENCOUNTER — Other Ambulatory Visit (INDEPENDENT_AMBULATORY_CARE_PROVIDER_SITE_OTHER)

## 2023-11-07 DIAGNOSIS — E785 Hyperlipidemia, unspecified: Secondary | ICD-10-CM

## 2023-11-07 DIAGNOSIS — E559 Vitamin D deficiency, unspecified: Secondary | ICD-10-CM

## 2023-11-07 DIAGNOSIS — E538 Deficiency of other specified B group vitamins: Secondary | ICD-10-CM

## 2023-11-07 DIAGNOSIS — E119 Type 2 diabetes mellitus without complications: Secondary | ICD-10-CM

## 2023-11-07 DIAGNOSIS — M810 Age-related osteoporosis without current pathological fracture: Secondary | ICD-10-CM | POA: Diagnosis not present

## 2023-11-07 DIAGNOSIS — I10 Essential (primary) hypertension: Secondary | ICD-10-CM

## 2023-11-07 LAB — MICROALBUMIN / CREATININE URINE RATIO
Creatinine,U: 73.3 mg/dL
Microalb Creat Ratio: 29.6 mg/g (ref 0.0–30.0)
Microalb, Ur: 2.2 mg/dL — ABNORMAL HIGH (ref 0.0–1.9)

## 2023-11-07 LAB — COMPREHENSIVE METABOLIC PANEL WITH GFR
ALT: 9 U/L (ref 0–35)
AST: 16 U/L (ref 0–37)
Albumin: 4.1 g/dL (ref 3.5–5.2)
Alkaline Phosphatase: 70 U/L (ref 39–117)
BUN: 14 mg/dL (ref 6–23)
CO2: 27 meq/L (ref 19–32)
Calcium: 8.9 mg/dL (ref 8.4–10.5)
Chloride: 104 meq/L (ref 96–112)
Creatinine, Ser: 0.9 mg/dL (ref 0.40–1.20)
GFR: 58.67 mL/min — ABNORMAL LOW (ref >60.00)
Glucose, Bld: 115 mg/dL — ABNORMAL HIGH (ref 70–99)
Potassium: 3.9 meq/L (ref 3.5–5.1)
Sodium: 138 meq/L (ref 135–145)
Total Bilirubin: 0.7 mg/dL (ref 0.2–1.2)
Total Protein: 6.5 g/dL (ref 6.0–8.3)

## 2023-11-07 LAB — CBC WITH DIFFERENTIAL/PLATELET
Basophils Absolute: 0.1 10*3/uL (ref 0.0–0.1)
Basophils Relative: 1 % (ref 0.0–3.0)
Eosinophils Absolute: 0.1 10*3/uL (ref 0.0–0.7)
Eosinophils Relative: 1.9 % (ref 0.0–5.0)
HCT: 35.5 % — ABNORMAL LOW (ref 36.0–46.0)
Hemoglobin: 11.5 g/dL — ABNORMAL LOW (ref 12.0–15.0)
Lymphocytes Relative: 31.5 % (ref 12.0–46.0)
Lymphs Abs: 1.8 10*3/uL (ref 0.7–4.0)
MCHC: 32.5 g/dL (ref 30.0–36.0)
MCV: 91.2 fl (ref 78.0–100.0)
Monocytes Absolute: 0.6 10*3/uL (ref 0.1–1.0)
Monocytes Relative: 10.4 % (ref 3.0–12.0)
Neutro Abs: 3.2 10*3/uL (ref 1.4–7.7)
Neutrophils Relative %: 55.2 % (ref 43.0–77.0)
Platelets: 208 10*3/uL (ref 150.0–400.0)
RBC: 3.89 Mil/uL (ref 3.87–5.11)
RDW: 14.7 % (ref 11.5–15.5)
WBC: 5.8 10*3/uL (ref 4.0–10.5)

## 2023-11-07 LAB — LIPID PANEL
Cholesterol: 138 mg/dL (ref 0–200)
HDL: 70.6 mg/dL (ref >39.00)
LDL Cholesterol: 59 mg/dL (ref 0–99)
NonHDL: 67.76
Total CHOL/HDL Ratio: 2
Triglycerides: 43 mg/dL (ref 0.0–149.0)
VLDL: 8.6 mg/dL (ref 0.0–40.0)

## 2023-11-07 LAB — HEMOGLOBIN A1C: Hgb A1c MFr Bld: 6.3 % (ref 4.6–6.5)

## 2023-11-08 LAB — VITAMIN D 25 HYDROXY (VIT D DEFICIENCY, FRACTURES): VITD: 34.08 ng/mL (ref 30.00–100.00)

## 2023-11-08 LAB — VITAMIN B12: Vitamin B-12: 118 pg/mL — ABNORMAL LOW (ref 211–911)

## 2023-11-09 ENCOUNTER — Ambulatory Visit (INDEPENDENT_AMBULATORY_CARE_PROVIDER_SITE_OTHER)
Admission: RE | Admit: 2023-11-09 | Discharge: 2023-11-09 | Disposition: A | Discharge status: Home / Self Care | Source: Ambulatory Visit | Attending: Internal Medicine | Admitting: Internal Medicine

## 2023-11-09 DIAGNOSIS — M810 Age-related osteoporosis without current pathological fracture: Secondary | ICD-10-CM | POA: Diagnosis not present

## 2023-11-10 ENCOUNTER — Encounter: Payer: Self-pay | Admitting: Internal Medicine

## 2023-12-14 DIAGNOSIS — E119 Type 2 diabetes mellitus without complications: Secondary | ICD-10-CM | POA: Diagnosis not present

## 2023-12-14 DIAGNOSIS — Z961 Presence of intraocular lens: Secondary | ICD-10-CM | POA: Diagnosis not present

## 2023-12-14 DIAGNOSIS — H04121 Dry eye syndrome of right lacrimal gland: Secondary | ICD-10-CM | POA: Diagnosis not present

## 2023-12-14 LAB — HM DIABETES EYE EXAM

## 2023-12-21 ENCOUNTER — Other Ambulatory Visit: Payer: Self-pay | Admitting: Internal Medicine

## 2024-02-15 ENCOUNTER — Emergency Department (HOSPITAL_COMMUNITY)

## 2024-02-15 ENCOUNTER — Ambulatory Visit (HOSPITAL_COMMUNITY): Admission: EM | Admit: 2024-02-15 | Discharge: 2024-02-15 | Disposition: A | Discharge status: Home / Self Care

## 2024-02-15 ENCOUNTER — Other Ambulatory Visit: Payer: Self-pay

## 2024-02-15 ENCOUNTER — Encounter (HOSPITAL_COMMUNITY): Payer: Self-pay

## 2024-02-15 ENCOUNTER — Emergency Department (HOSPITAL_COMMUNITY)
Admission: EM | Admit: 2024-02-15 | Discharge: 2024-02-15 | Disposition: A | Discharge status: Home / Self Care | Attending: Emergency Medicine | Admitting: Emergency Medicine

## 2024-02-15 DIAGNOSIS — S8991XA Unspecified injury of right lower leg, initial encounter: Secondary | ICD-10-CM | POA: Diagnosis present

## 2024-02-15 DIAGNOSIS — S8001XA Contusion of right knee, initial encounter: Secondary | ICD-10-CM | POA: Insufficient documentation

## 2024-02-15 DIAGNOSIS — Z96652 Presence of left artificial knee joint: Secondary | ICD-10-CM | POA: Diagnosis not present

## 2024-02-15 DIAGNOSIS — M25562 Pain in left knee: Secondary | ICD-10-CM

## 2024-02-15 DIAGNOSIS — R197 Diarrhea, unspecified: Secondary | ICD-10-CM | POA: Insufficient documentation

## 2024-02-15 DIAGNOSIS — M19012 Primary osteoarthritis, left shoulder: Secondary | ICD-10-CM | POA: Diagnosis not present

## 2024-02-15 DIAGNOSIS — E119 Type 2 diabetes mellitus without complications: Secondary | ICD-10-CM | POA: Diagnosis not present

## 2024-02-15 DIAGNOSIS — S0990XA Unspecified injury of head, initial encounter: Secondary | ICD-10-CM

## 2024-02-15 DIAGNOSIS — S0012XA Contusion of left eyelid and periocular area, initial encounter: Secondary | ICD-10-CM | POA: Insufficient documentation

## 2024-02-15 DIAGNOSIS — Z79899 Other long term (current) drug therapy: Secondary | ICD-10-CM | POA: Diagnosis not present

## 2024-02-15 DIAGNOSIS — D72829 Elevated white blood cell count, unspecified: Secondary | ICD-10-CM | POA: Insufficient documentation

## 2024-02-15 DIAGNOSIS — M778 Other enthesopathies, not elsewhere classified: Secondary | ICD-10-CM | POA: Diagnosis not present

## 2024-02-15 DIAGNOSIS — E860 Dehydration: Secondary | ICD-10-CM | POA: Diagnosis not present

## 2024-02-15 DIAGNOSIS — M25561 Pain in right knee: Secondary | ICD-10-CM

## 2024-02-15 DIAGNOSIS — M19022 Primary osteoarthritis, left elbow: Secondary | ICD-10-CM | POA: Diagnosis not present

## 2024-02-15 DIAGNOSIS — M25512 Pain in left shoulder: Secondary | ICD-10-CM | POA: Diagnosis not present

## 2024-02-15 DIAGNOSIS — S8000XA Contusion of unspecified knee, initial encounter: Secondary | ICD-10-CM

## 2024-02-15 DIAGNOSIS — I10 Essential (primary) hypertension: Secondary | ICD-10-CM | POA: Diagnosis not present

## 2024-02-15 DIAGNOSIS — W19XXXA Unspecified fall, initial encounter: Secondary | ICD-10-CM | POA: Insufficient documentation

## 2024-02-15 DIAGNOSIS — I6529 Occlusion and stenosis of unspecified carotid artery: Secondary | ICD-10-CM | POA: Diagnosis not present

## 2024-02-15 DIAGNOSIS — S0512XA Contusion of eyeball and orbital tissues, left eye, initial encounter: Secondary | ICD-10-CM | POA: Diagnosis not present

## 2024-02-15 DIAGNOSIS — Z7982 Long term (current) use of aspirin: Secondary | ICD-10-CM | POA: Diagnosis not present

## 2024-02-15 DIAGNOSIS — I6782 Cerebral ischemia: Secondary | ICD-10-CM | POA: Diagnosis not present

## 2024-02-15 DIAGNOSIS — M25461 Effusion, right knee: Secondary | ICD-10-CM | POA: Diagnosis not present

## 2024-02-15 DIAGNOSIS — S8002XA Contusion of left knee, initial encounter: Secondary | ICD-10-CM | POA: Diagnosis not present

## 2024-02-15 DIAGNOSIS — M25522 Pain in left elbow: Secondary | ICD-10-CM | POA: Diagnosis not present

## 2024-02-15 LAB — COMPREHENSIVE METABOLIC PANEL WITH GFR
ALT: 18 U/L (ref 0–44)
AST: 39 U/L (ref 15–41)
Albumin: 3.8 g/dL (ref 3.5–5.0)
Alkaline Phosphatase: 77 U/L (ref 38–126)
Anion gap: 13 (ref 5–15)
BUN: 16 mg/dL (ref 8–23)
CO2: 19 mmol/L — ABNORMAL LOW (ref 22–32)
Calcium: 9.3 mg/dL (ref 8.9–10.3)
Chloride: 102 mmol/L (ref 98–111)
Creatinine, Ser: 1.04 mg/dL — ABNORMAL HIGH (ref 0.44–1.00)
GFR, Estimated: 53 mL/min — ABNORMAL LOW (ref >60)
Glucose, Bld: 110 mg/dL — ABNORMAL HIGH (ref 70–99)
Potassium: 3.9 mmol/L (ref 3.5–5.1)
Sodium: 134 mmol/L — ABNORMAL LOW (ref 135–145)
Total Bilirubin: 1.6 mg/dL — ABNORMAL HIGH (ref 0.0–1.2)
Total Protein: 6.5 g/dL (ref 6.5–8.1)

## 2024-02-15 LAB — CBC
HCT: 39.7 % (ref 36.0–46.0)
Hemoglobin: 12.9 g/dL (ref 12.0–15.0)
MCH: 29.1 pg (ref 26.0–34.0)
MCHC: 32.5 g/dL (ref 30.0–36.0)
MCV: 89.6 fL (ref 80.0–100.0)
Platelets: 193 K/uL (ref 150–400)
RBC: 4.43 MIL/uL (ref 3.87–5.11)
RDW: 14.3 % (ref 11.5–15.5)
WBC: 11 K/uL — ABNORMAL HIGH (ref 4.0–10.5)
nRBC: 0 % (ref 0.0–0.2)

## 2024-02-15 LAB — LIPASE, BLOOD: Lipase: 23 U/L (ref 11–51)

## 2024-02-15 MED ORDER — SODIUM CHLORIDE 0.9 % IV BOLUS
500.0000 mL | Freq: Once | INTRAVENOUS | Status: AC
  Administered 2024-02-15: 500 mL via INTRAVENOUS

## 2024-02-15 NOTE — ED Triage Notes (Addendum)
 Patient fell at 3am Thursday morning while walking to the bathroom onto tile. Was not dizzy. Has a skin tear to left elbow. Bilateral knee pain. Patient hit her head. Takes baby aspirin . Family is requesting to get her sodium level checked due to having diarrhea for 2 days.

## 2024-02-15 NOTE — ED Provider Notes (Signed)
 [place] EMERGENCY DEPARTMENT AT [facility] Provider Note   CSN: [id number] Arrival date & time: 02/15/24  1340     Patient presents with: [place]   Emily Sweeney is a 83 y.o. female.    Fall     Patient has a history of hypertension and diverticulitis lumbar radiculopathy diabetes hyponatremia acid reflux.  Patient presents emergency room for evaluation after a fall.  Patient states she has been having some issues with diarrhea the last couple days.  She fell yesterday early morning.  Patient states she was having diarrhea so she was trying to get to the bathroom and then she fell.  Patient was unable to get up initially so she had to call EMS.  They were able to help her up.  Patient has been able to walk since then.  It has been approximately 36 hours since her fall.  Family members went to check on her today and they encouraged her to be evaluated.  Patient states has been having some soreness in her shoulder as well as her knees.  Her diarrhea is improving.  She has been taking Imodium.  Patient denies any vomiting.  No abdominal pain.  No fevers or chills.  Patient went to an urgent care and because of her head injury they recommended she come to the ED for evaluation.  Prior to Admission medications   Medication Sig Start Date End Date Taking? Authorizing Provider  aspirin  EC 81 MG tablet Take 1 tablet (81 mg total) by mouth daily. 10/16/14   Joshua Debby CROME, MD  atorvastatin  (LIPITOR) 20 MG tablet TAKE 1 TABLET ONCE DAILY. 02/27/23   Geofm Glade PARAS, MD  dicyclomine  (BENTYL ) 10 MG capsule TAKE ONE CAPSULE BY MOUTH THREE TIMES DAILY BEFORE MEALS 08/29/23   Abran Norleen SAILOR, MD  fluticasone  (FLONASE ) 50 MCG/ACT nasal spray Place 1 spray into both nostrils daily as needed for allergies or rhinitis.    [provider]  metoprolol  succinate (TOPROL -XL) 25 MG 24 hr tablet TAKE 1 AND 1/2 TABLETS BY MOUTH DAILY 12/21/23   Geofm Glade PARAS, MD  Multiple Vitamins-Minerals (HAIR SKIN  AND NAILS FORMULA) TABS Take 1 tablet by mouth daily.    [provider]  pantoprazole  (PROTONIX ) 40 MG tablet Take 1 tablet (40 mg total) by mouth daily. 08/29/23   Abran Norleen SAILOR, MD  Probiotic Product (PROBIOTIC ADVANCED PO) Take 1 capsule by mouth daily.    [provider]  Propylene Glycol (SYSTANE BALANCE OP) Place 1 drop into both eyes daily as needed (dry eyes).    [provider]  ramipril  (ALTACE ) 10 MG capsule Take 1 capsule (10 mg total) by mouth daily. 05/07/23   Geofm Glade PARAS, MD  simethicone  (MYLICON) 80 MG chewable tablet Chew 80 mg by mouth every 6 (six) hours as needed for flatulence.    [provider]  spironolactone  (ALDACTONE ) 25 MG tablet TAKE 1/2 TABLET BY MOUTH DAILY 02/28/23   Geofm Glade PARAS, MD    Allergies: Macrobid  [nitrofurantoin  macrocrystal], Norvasc  [amlodipine  besylate], Ceftin  [cefuroxime  axetil], Ciprofloxacin , Penicillins, and Sulfonamide derivatives    Review of Systems  Updated Vital Signs BP 124/74   Pulse 90   Temp (!) 97.1 F (36.2 C) (Oral)   Resp 18   Ht 1.676 m (5' 6)   Wt 65.8 kg   SpO2 95%   BMI 23.40 kg/m   Physical Exam Vitals and nursing note reviewed.  Constitutional:      General: She is not  in acute distress.    Appearance: She is well-developed.  HENT:     Head: Normocephalic.     Comments: Small amount of bruising noted around the left eye    Right Ear: External ear normal.     Left Ear: External ear normal.  Eyes:     General: No scleral icterus.       Right eye: No discharge.        Left eye: No discharge.     Conjunctiva/sclera: Conjunctivae normal.  Neck:     Trachea: No tracheal deviation.  Cardiovascular:     Rate and Rhythm: Normal rate and regular rhythm.  Pulmonary:     Effort: Pulmonary effort is normal. No respiratory distress.     Breath sounds: Normal breath sounds. No stridor. No wheezing or rales.  Abdominal:     General: Bowel sounds are normal. There is no  distension.     Palpations: Abdomen is soft.     Tenderness: There is no abdominal tenderness. There is no guarding or rebound.  Musculoskeletal:        General: No tenderness or deformity.     Cervical back: Neck supple.  Skin:    General: Skin is warm and dry.     Findings: No rash.  Neurological:     General: No focal deficit present.     Mental Status: She is alert.     Cranial Nerves: No cranial nerve deficit, dysarthria or facial asymmetry.     Sensory: No sensory deficit.     Motor: No abnormal muscle tone or seizure activity.     Coordination: Coordination normal.  Psychiatric:        Mood and Affect: Mood normal.     (all labs ordered are listed, but only abnormal results are displayed) Labs Reviewed  COMPREHENSIVE METABOLIC PANEL WITH GFR - Abnormal; Notable for the following components:      Result Value   Sodium 134 (*)    CO2 19 (*)    Glucose, Bld 110 (*)    Creatinine, Ser 1.04 (*)    Total Bilirubin 1.6 (*)    GFR, Estimated 53 (*)    All other components within normal limits  CBC - Abnormal; Notable for the following components:   WBC 11.0 (*)    All other components within normal limits  LIPASE, BLOOD    EKG: None  Radiology: CT Head Wo Contrast Result Date: 02/15/2024 CLINICAL DATA:  Head trauma, neck trauma, fall, left eye bruising. EXAM: CT HEAD WITHOUT CONTRAST CT CERVICAL SPINE WITHOUT CONTRAST TECHNIQUE: Multidetector CT imaging of the head and cervical spine was performed following the standard protocol without intravenous contrast. Multiplanar CT image reconstructions of the cervical spine were also generated. RADIATION DOSE REDUCTION: This exam was performed according to the departmental dose-optimization program which includes automated exposure control, adjustment of the mA and/or kV according to patient size and/or use of iterative reconstruction technique. COMPARISON:  CT head and cervical spine 04/29/2022. FINDINGS: CT HEAD FINDINGS Brain: No  acute intracranial hemorrhage. No CT evidence of acute infarct. Nonspecific hypoattenuation in the periventricular and subcortical white matter favored to reflect chronic microvascular ischemic changes. No edema, mass effect, or midline shift. The basilar cisterns are patent. Ventricles: Prominence of the ventricles suggesting underlying parenchymal volume loss. Vascular: Atherosclerotic calcifications of the carotid siphons and intracranial vertebral arteries. No hyperdense vessel. Skull: No acute or aggressive finding. Orbits: Bilateral lens replacement. Sinuses: The visualized paranasal sinuses are clear. Other: Soft  tissue swelling over the left forehead extending into the superomedial soft tissues of the left orbit and glabella. Postsurgical changes of the right mastoid temporal bone. CT CERVICAL SPINE FINDINGS Alignment: Alignment is maintained. Trace anterolisthesis of C4 on C5 and C7 on T1 again noted. No facet subluxation or dislocation. Skull base and vertebrae: No acute fracture. No primary bone lesion or focal pathologic process. Soft tissues and spinal canal: No prevertebral fluid or swelling. No visible canal hematoma. Disc levels: Intervertebral disc space narrowing most pronounced at C5-6 and C6-7. Small disc bulges at multiple levels. Disc osteophyte complex at C5-6 and at C6-7. No high-grade osseous spinal canal stenosis. Facet arthrosis and uncovertebral hypertrophy at multiple levels. Moderate foraminal stenosis at multiple levels. Upper chest: No acute finding. Other: None. IMPRESSION: No CT evidence of acute intracranial abnormality. No acute fracture or traumatic malalignment of the cervical spine. Mild soft tissue swelling over the left forehead and periorbital soft tissues. Chronic and degenerative changes as above. Electronically Signed   By: Donnice Mania M.D.   On: 02/15/2024 16:35   CT Cervical Spine Wo Contrast Result Date: 02/15/2024 CLINICAL DATA:  Head trauma, neck trauma, fall,  left eye bruising. EXAM: CT HEAD WITHOUT CONTRAST CT CERVICAL SPINE WITHOUT CONTRAST TECHNIQUE: Multidetector CT imaging of the head and cervical spine was performed following the standard protocol without intravenous contrast. Multiplanar CT image reconstructions of the cervical spine were also generated. RADIATION DOSE REDUCTION: This exam was performed according to the departmental dose-optimization program which includes automated exposure control, adjustment of the mA and/or kV according to patient size and/or use of iterative reconstruction technique. COMPARISON:  CT head and cervical spine 04/29/2022. FINDINGS: CT HEAD FINDINGS Brain: No acute intracranial hemorrhage. No CT evidence of acute infarct. Nonspecific hypoattenuation in the periventricular and subcortical white matter favored to reflect chronic microvascular ischemic changes. No edema, mass effect, or midline shift. The basilar cisterns are patent. Ventricles: Prominence of the ventricles suggesting underlying parenchymal volume loss. Vascular: Atherosclerotic calcifications of the carotid siphons and intracranial vertebral arteries. No hyperdense vessel. Skull: No acute or aggressive finding. Orbits: Bilateral lens replacement. Sinuses: The visualized paranasal sinuses are clear. Other: Soft tissue swelling over the left forehead extending into the superomedial soft tissues of the left orbit and glabella. Postsurgical changes of the right mastoid temporal bone. CT CERVICAL SPINE FINDINGS Alignment: Alignment is maintained. Trace anterolisthesis of C4 on C5 and C7 on T1 again noted. No facet subluxation or dislocation. Skull base and vertebrae: No acute fracture. No primary bone lesion or focal pathologic process. Soft tissues and spinal canal: No prevertebral fluid or swelling. No visible canal hematoma. Disc levels: Intervertebral disc space narrowing most pronounced at C5-6 and C6-7. Small disc bulges at multiple levels. Disc osteophyte complex  at C5-6 and at C6-7. No high-grade osseous spinal canal stenosis. Facet arthrosis and uncovertebral hypertrophy at multiple levels. Moderate foraminal stenosis at multiple levels. Upper chest: No acute finding. Other: None. IMPRESSION: No CT evidence of acute intracranial abnormality. No acute fracture or traumatic malalignment of the cervical spine. Mild soft tissue swelling over the left forehead and periorbital soft tissues. Chronic and degenerative changes as above. Electronically Signed   By: Donnice Mania M.D.   On: 02/15/2024 16:35   DG Shoulder Left Result Date: 02/15/2024 CLINICAL DATA:  Left shoulder pain since falling yesterday morning. EXAM: LEFT SHOULDER - 2+ VIEW COMPARISON:  Shoulder radiographs 06/01/2015. FINDINGS: The bones appear adequately mineralized. No evidence of acute fracture or dislocation. Mild  glenohumeral and acromioclavicular degenerative changes. The soft tissues appear unremarkable. IMPRESSION: Mild degenerative changes without evidence of acute fracture or dislocation. Electronically Signed   By: Elsie Perone M.D.   On: 02/15/2024 14:53   DG Elbow Complete Left Result Date: 02/15/2024 CLINICAL DATA:  Left elbow pain since falling yesterday morning. EXAM: LEFT ELBOW - COMPLETE 3+ VIEW COMPARISON:  Left humerus radiographs 06/01/2015. FINDINGS: The bones appear adequately mineralized. No evidence of acute fracture, dislocation or elbow joint effusion. Mild spurring of the humeral epicondyles and coronoid process. No focal soft tissue abnormalities are identified. IMPRESSION: No evidence of acute fracture or dislocation. Mild degenerative changes. Electronically Signed   By: Elsie Perone M.D.   On: 02/15/2024 14:51   DG Knee Complete 4 Views Right Result Date: 02/15/2024 CLINICAL DATA:  Knee pain since falling yesterday morning. EXAM: RIGHT KNEE - COMPLETE 4+ VIEW COMPARISON:  None Available. FINDINGS: The mineralization and alignment are normal. There is no evidence of  acute fracture or dislocation. The joint spaces are relatively preserved. There is a small knee joint effusion. Prominent vascular calcifications are noted. No evidence of foreign body or soft tissue emphysema. IMPRESSION: No evidence of acute fracture or dislocation. Small knee joint effusion. Electronically Signed   By: Elsie Perone M.D.   On: 02/15/2024 14:50   DG Knee Complete 4 Views Left Result Date: 02/15/2024 CLINICAL DATA:  Knee pain after falling yesterday morning. EXAM: LEFT KNEE - COMPLETE 4+ VIEW COMPARISON:  Radiographs 10/21/2020. FINDINGS: Interval left total knee arthroplasty. The hardware appears intact, without evidence of loosening. No evidence of acute fracture or dislocation. No significant knee joint effusion. Prominent vascular calcifications are noted. IMPRESSION: Interval left total knee arthroplasty without evidence of acute fracture or dislocation. Electronically Signed   By: Elsie Perone M.D.   On: 02/15/2024 14:49     Procedures   Medications Ordered in the ED  sodium chloride  0.9 % bolus 500 mL (500 mLs Intravenous New Bag/Given 02/15/24 1614)    Clinical Course as of 02/15/24 1657  Fri Feb 15, 2024  1541 CBC shows no anemia slight increase in white blood cell count.  Lipase normal.  Metabolic panel shows slight decrease in sodium.  Creatinine is also slightly elevated bicarb decreased.  Bilirubin slightly increased [JK]  1542 X-rays reviewed.  No signs of fracture of the shoulder elbow or bilateral knees. [JK]  1656 CT and C-spine CT without acute findings [JK]    Clinical Course User Index [JK] Randol Simmonds, MD                                 Medical Decision Making Amount and/or Complexity of Data Reviewed Labs: ordered. Radiology: ordered.   Patient presented to the ED for evaluation after a fall in the setting having diarrhea.  Patient's fall was over 24 hours ago.  She has been able to ambulate.  Patient has not had any syncopal episodes.  She is  not having abdominal pain.  Laboratory test do show some mild electrolyte abnormalities with her sodium being slightly decreased.  Patient however is eating and drinking without difficulty.  I suspect this is related to her diarrhea.  I have ordered bolus of IV fluids.  Covid slightly increased.  No abdominal pain.  Low suspicion for cholangitis biliary colic as she is not having abdominal pain.  Will have her follow-up with her primary care doctor to have that rechecked.  Final diagnoses:  Diarrhea, unspecified type  Dehydration  Contusion of knee, unspecified laterality, initial encounter    ED Discharge Orders     None          Randol Simmonds, MD 02/15/24 1657

## 2024-02-15 NOTE — Discharge Instructions (Signed)
 Please go to the ER for evaluation of head injury and other injuries.

## 2024-02-15 NOTE — ED Provider Notes (Signed)
 [facility]    CSN: [id number] Arrival date & time: 02/15/24  1238      History   Chief Complaint Chief Complaint  Patient presents with   Fall   Knee Pain   Back Pain    HPI Emily Sweeney is a 83 y.o. female. Around 2am on 02/14/23, she woke up with diarrhea. Hurried to the bathroom. While lowering her pants, she lost her balance and fell to the floor. Impact and pain to B knees, forehead, and has back pain, L shoulder pain, and skin tear to L forearm. Denies LOC.    Fall  Knee Pain Associated symptoms: back pain   Back Pain   Past Medical History:  Diagnosis Date   Anemia    Arthritis of left knee 2022   Blood transfusion without reported diagnosis    Diverticulosis of colon    Fracture, finger 2023   GERD (gastroesophageal reflux disease)    Hearing loss    right ear, and tinnitus   Hiatal hernia    Hyperlipidemia    Hypertension    Hyponatremia 2022   Tubular adenoma 03/05/2015   Polyp, and Benign lymphoid polyp.   UTI (urinary tract infection)     Patient Active Problem List   Diagnosis Date Noted   Acute left-sided low back pain 12/04/2022   Metacarpal bone fracture 05/05/2022   GERD (gastroesophageal reflux disease) 03/21/2021   Dizziness 03/13/2021   Hyponatremia 03/13/2021   Callus 01/31/2021   Hav (hallux abducto valgus), unspecified laterality 01/31/2021   Plantar flexed metatarsal 01/31/2021   PTTD (posterior tibial tendon dysfunction) 01/31/2021   Acute right lumbar radiculopathy 08/16/2020   Diabetes mellitus without complication (HCC) 09/20/2017   Iron deficiency anemia 07/01/2016   Chronic lumbar radiculopathy 06/17/2016   Poor balance 05/24/2016   At high risk for falls 05/24/2016   Gait abnormality 04/27/2016   Lumbosacral spondylosis without myelopathy 01/19/2016   S/P excision of acoustic neuroma 01/19/2016   B12 deficiency 10/17/2015   Pernicious anemia 06/09/2015   PVC (premature ventricular contraction) 02/25/2015    Osteoporosis 01/12/2015   Vitamin D  deficiency 01/12/2015   Localized osteoarthritis of left knee 12/18/2011   DIVERTICULITIS OF COLON 10/11/2009   Dyslipidemia 10/21/2008   Essential hypertension 10/21/2008    Past Surgical History:  Procedure Laterality Date   APPENDECTOMY     BREAST BIOPSY     benign   CATARACT EXTRACTION, BILATERAL  2019, 2020   COCHLEAR IMPLANT     CRANIECTOMY FOR EXCISION OF ACOUSTIC NEUROMA  2009   KNEE ARTHROPLASTY  03/2022   NASAL SEPTUM SURGERY     TONSILLECTOMY     UPPER GASTROINTESTINAL ENDOSCOPY  06/02/2020    OB History   No obstetric history on file.      Home Medications    Prior to Admission medications   Medication Sig Start Date End Date Taking? Authorizing Provider  aspirin  EC 81 MG tablet Take 1 tablet (81 mg total) by mouth daily. 10/16/14   Joshua Debby CROME, MD  atorvastatin  (LIPITOR) 20 MG tablet TAKE 1 TABLET ONCE DAILY. 02/27/23   Geofm Glade PARAS, MD  dicyclomine  (BENTYL ) 10 MG capsule TAKE ONE CAPSULE BY MOUTH THREE TIMES DAILY BEFORE MEALS 08/29/23   Abran Norleen SAILOR, MD  fluticasone  (FLONASE ) 50 MCG/ACT nasal spray Place 1 spray into both nostrils daily as needed for allergies or rhinitis.    [provider]  metoprolol  succinate (TOPROL -XL) 25 MG 24 hr tablet TAKE 1 AND 1/2 TABLETS  BY MOUTH DAILY 12/21/23   Geofm Glade PARAS, MD  Multiple Vitamins-Minerals (HAIR SKIN AND NAILS FORMULA) TABS Take 1 tablet by mouth daily.    [provider]  pantoprazole  (PROTONIX ) 40 MG tablet Take 1 tablet (40 mg total) by mouth daily. 08/29/23   Abran Norleen SAILOR, MD  Probiotic Product (PROBIOTIC ADVANCED PO) Take 1 capsule by mouth daily.    [provider]  Propylene Glycol (SYSTANE BALANCE OP) Place 1 drop into both eyes daily as needed (dry eyes).    [provider]  ramipril  (ALTACE ) 10 MG capsule Take 1 capsule (10 mg total) by mouth daily. 05/07/23   Geofm Glade PARAS, MD  simethicone  (MYLICON) 80 MG chewable tablet Chew  80 mg by mouth every 6 (six) hours as needed for flatulence.    [provider]  spironolactone  (ALDACTONE ) 25 MG tablet TAKE 1/2 TABLET BY MOUTH DAILY 02/28/23   Geofm Glade PARAS, MD    Family History Family History  Problem Relation Age of Onset   Dementia Mother 61   Sudden death Father 52       MI   Coronary artery disease Father    Heart disease Father    Alcohol abuse Brother    Breast cancer Daughter    Colon cancer Neg Hx    Esophageal cancer Neg Hx     Social History Social History   Tobacco Use   Smoking status: Former    Current packs/day: 0.00    Types: Cigarettes    Quit date: 07/04/1991    Years since quitting: 32.6    Passive exposure: Past   Smokeless tobacco: Never  Vaping Use   Vaping status: Never Used  Substance Use Topics   Alcohol use: Yes    Alcohol/week: 0.0 standard drinks of alcohol    Comment: glass wine 4-5 days a week   Drug use: No     Allergies   Macrobid  [nitrofurantoin  macrocrystal], Norvasc  [amlodipine  besylate], Ceftin  [cefuroxime  axetil], Ciprofloxacin , Penicillins, and Sulfonamide derivatives   Review of Systems Review of Systems  Musculoskeletal:  Positive for back pain.     Physical Exam Triage Vital Signs ED Triage Vitals  Encounter Vitals Group     BP 02/15/24 1255 116/72     Girls Systolic BP Percentile --      Girls Diastolic BP Percentile --      Boys Systolic BP Percentile --      Boys Diastolic BP Percentile --      Pulse Rate 02/15/24 1255 93     Resp 02/15/24 1255 16     Temp 02/15/24 1255 97.7 F (36.5 C)     Temp Source 02/15/24 1255 Oral     SpO2 02/15/24 1255 95 %     Weight --      Height --      Head Circumference --      Peak Flow --      Pain Score 02/15/24 1254 3     Pain Loc --      Pain Education --      Exclude from Growth Chart --    No data found.  Updated Vital Signs BP 116/72 (BP Location: Right Arm)   Pulse 93   Temp 97.7 F (36.5 C) (Oral)   Resp 16   SpO2 95%    Visual Acuity Right Eye Distance:   Left Eye Distance:   Bilateral Distance:    Right Eye Near:   Left Eye Near:  Bilateral Near:     Physical Exam Constitutional:      General: She is not in acute distress. HENT:     Head: Normocephalic.   Skin:    Findings: Abrasion and bruising present.     Comments: Abrasion to R knee.  Bandage on L forearm - I did not remove bandage.  Bruising L forearm  Neurological:     Mental Status: She is alert.      UC Treatments / Results  Labs (all labs ordered are listed, but only abnormal results are displayed) Labs Reviewed - No data to display  EKG   Radiology No results found.  Procedures Procedures (including critical care time)  Medications Ordered in UC Medications - No data to display  Initial Impression / Assessment and Plan / UC Course  I have reviewed the triage vital signs and the nursing notes.  Pertinent labs & imaging results that were available during my care of the patient were reviewed by me and considered in my medical decision making (see chart for details).    Due to head injury, needs eval in ED.   Final Clinical Impressions(s) / UC Diagnoses   Final diagnoses:  Injury of head, initial encounter  Fall, initial encounter  Acute pain of both knees     Discharge Instructions      Please go to the ER for evaluation of head injury and other injuries.    ED Prescriptions   None    PDMP not reviewed this encounter.   Richad Jon HERO, NP 02/15/24 1320

## 2024-02-15 NOTE — ED Triage Notes (Signed)
 Patient reports that she jumped up to go to the bathroom last night and fell on her knees. Patient c/o bilateral knee pain, left shoulder pain, and bilateral lower back pain that radiates into the buttocks. Patient also c/o left eye bruising.  Patient has a skin tear to the left forearm.  Patient denies LOC.  Patient states she normally does not use a walker, but is needing it today.  Patient states she took aleve for pain.

## 2024-02-15 NOTE — ED Notes (Signed)
 Patient is being discharged from the Urgent Care and sent to the Emergency Department via private vehicle . Per provider, patient is in need of higher level of care due to head injury. Patient is aware and verbalizes understanding of plan of care.  Vitals:   02/15/24 1255  BP: 116/72  Pulse: 93  Resp: 16  Temp: 97.7 F (36.5 C)  SpO2: 95%

## 2024-02-15 NOTE — ED Notes (Signed)
 PT in x-ray

## 2024-02-15 NOTE — Discharge Instructions (Addendum)
 Continue the antidiarrheal agents.  Make sure to drink plenty of fluids.  Increase your salt intake.

## 2024-02-21 ENCOUNTER — Other Ambulatory Visit: Payer: Self-pay | Admitting: Internal Medicine

## 2024-03-06 ENCOUNTER — Other Ambulatory Visit: Payer: Self-pay | Admitting: Internal Medicine

## 2024-03-20 ENCOUNTER — Other Ambulatory Visit: Payer: Self-pay | Admitting: Internal Medicine

## 2024-05-06 ENCOUNTER — Other Ambulatory Visit: Payer: Self-pay | Admitting: Internal Medicine

## 2024-06-19 ENCOUNTER — Other Ambulatory Visit: Payer: Self-pay | Admitting: Internal Medicine

## 2024-08-05 ENCOUNTER — Emergency Department (HOSPITAL_COMMUNITY)

## 2024-08-05 ENCOUNTER — Other Ambulatory Visit: Payer: Self-pay

## 2024-08-05 ENCOUNTER — Telehealth: Admitting: Nurse Practitioner

## 2024-08-05 ENCOUNTER — Telehealth: Payer: Self-pay | Admitting: Nurse Practitioner

## 2024-08-05 ENCOUNTER — Encounter (HOSPITAL_COMMUNITY): Payer: Self-pay

## 2024-08-05 ENCOUNTER — Emergency Department (HOSPITAL_COMMUNITY)
Admission: EM | Admit: 2024-08-05 | Discharge: 2024-08-05 | Disposition: A | Discharge status: Home / Self Care | Attending: Emergency Medicine | Admitting: Emergency Medicine

## 2024-08-05 DIAGNOSIS — K219 Gastro-esophageal reflux disease without esophagitis: Secondary | ICD-10-CM | POA: Insufficient documentation

## 2024-08-05 DIAGNOSIS — R519 Headache, unspecified: Secondary | ICD-10-CM | POA: Diagnosis present

## 2024-08-05 DIAGNOSIS — D649 Anemia, unspecified: Secondary | ICD-10-CM | POA: Diagnosis not present

## 2024-08-05 DIAGNOSIS — Z79899 Other long term (current) drug therapy: Secondary | ICD-10-CM | POA: Diagnosis not present

## 2024-08-05 DIAGNOSIS — Z7982 Long term (current) use of aspirin: Secondary | ICD-10-CM | POA: Insufficient documentation

## 2024-08-05 DIAGNOSIS — H918X1 Other specified hearing loss, right ear: Secondary | ICD-10-CM | POA: Diagnosis not present

## 2024-08-05 DIAGNOSIS — H532 Diplopia: Secondary | ICD-10-CM | POA: Insufficient documentation

## 2024-08-05 DIAGNOSIS — I10 Essential (primary) hypertension: Secondary | ICD-10-CM | POA: Diagnosis not present

## 2024-08-05 DIAGNOSIS — R202 Paresthesia of skin: Secondary | ICD-10-CM | POA: Diagnosis not present

## 2024-08-05 DIAGNOSIS — K449 Diaphragmatic hernia without obstruction or gangrene: Secondary | ICD-10-CM | POA: Diagnosis not present

## 2024-08-05 DIAGNOSIS — R200 Anesthesia of skin: Secondary | ICD-10-CM

## 2024-08-05 DIAGNOSIS — R42 Dizziness and giddiness: Secondary | ICD-10-CM | POA: Diagnosis present

## 2024-08-05 DIAGNOSIS — H539 Unspecified visual disturbance: Secondary | ICD-10-CM | POA: Diagnosis present

## 2024-08-05 LAB — CBC
HCT: 38 % (ref 36.0–46.0)
Hemoglobin: 12.3 g/dL (ref 12.0–15.0)
MCH: 29.2 pg (ref 26.0–34.0)
MCHC: 32.4 g/dL (ref 30.0–36.0)
MCV: 90.3 fL (ref 80.0–100.0)
Platelets: 203 K/uL (ref 150–400)
RBC: 4.21 MIL/uL (ref 3.87–5.11)
RDW: 14.9 % (ref 11.5–15.5)
WBC: 5.9 K/uL (ref 4.0–10.5)
nRBC: 0 % (ref 0.0–0.2)

## 2024-08-05 LAB — COMPREHENSIVE METABOLIC PANEL WITH GFR
ALT: 13 U/L (ref 0–44)
AST: 23 U/L (ref 15–41)
Albumin: 4.3 g/dL (ref 3.5–5.0)
Alkaline Phosphatase: 87 U/L (ref 38–126)
Anion gap: 10 (ref 5–15)
BUN: 16 mg/dL (ref 8–23)
CO2: 24 mmol/L (ref 22–32)
Calcium: 9.5 mg/dL (ref 8.9–10.3)
Chloride: 101 mmol/L (ref 98–111)
Creatinine, Ser: 1.08 mg/dL — ABNORMAL HIGH (ref 0.44–1.00)
GFR, Estimated: 50 mL/min — ABNORMAL LOW
Glucose, Bld: 102 mg/dL — ABNORMAL HIGH (ref 70–99)
Potassium: 4.5 mmol/L (ref 3.5–5.1)
Sodium: 136 mmol/L (ref 135–145)
Total Bilirubin: 0.6 mg/dL (ref 0.0–1.2)
Total Protein: 6.8 g/dL (ref 6.5–8.1)

## 2024-08-05 LAB — DIFFERENTIAL
Abs Immature Granulocytes: 0.01 K/uL (ref 0.00–0.07)
Basophils Absolute: 0 K/uL (ref 0.0–0.1)
Basophils Relative: 1 %
Eosinophils Absolute: 0 K/uL (ref 0.0–0.5)
Eosinophils Relative: 1 %
Immature Granulocytes: 0 %
Lymphocytes Relative: 27 %
Lymphs Abs: 1.6 K/uL (ref 0.7–4.0)
Monocytes Absolute: 0.6 K/uL (ref 0.1–1.0)
Monocytes Relative: 10 %
Neutro Abs: 3.6 K/uL (ref 1.7–7.7)
Neutrophils Relative %: 61 %

## 2024-08-05 LAB — I-STAT CHEM 8, ED
BUN: 16 mg/dL (ref 8–23)
Calcium, Ion: 1.24 mmol/L (ref 1.15–1.40)
Chloride: 101 mmol/L (ref 98–111)
Creatinine, Ser: 1 mg/dL (ref 0.44–1.00)
Glucose, Bld: 101 mg/dL — ABNORMAL HIGH (ref 70–99)
HCT: 39 % (ref 36.0–46.0)
Hemoglobin: 13.3 g/dL (ref 12.0–15.0)
Potassium: 4.3 mmol/L (ref 3.5–5.1)
Sodium: 136 mmol/L (ref 135–145)
TCO2: 23 mmol/L (ref 22–32)

## 2024-08-05 LAB — CBG MONITORING, ED: Glucose-Capillary: 74 mg/dL (ref 70–99)

## 2024-08-05 LAB — ETHANOL: Alcohol, Ethyl (B): <15 mg/dL

## 2024-08-05 LAB — APTT: aPTT: 28 s (ref 24–36)

## 2024-08-05 LAB — PROTIME-INR
INR: 1 (ref 0.8–1.2)
Prothrombin Time: 13.6 s (ref 11.4–15.2)

## 2024-08-05 MED ORDER — GADOBUTROL 1 MMOL/ML IV SOLN
6.5000 mL | Freq: Once | INTRAVENOUS | Status: AC | PRN
  Administered 2024-08-05: 6.5 mL via INTRAVENOUS

## 2024-08-05 MED ORDER — SODIUM CHLORIDE 0.9% FLUSH
3.0000 mL | Freq: Once | INTRAVENOUS | Status: AC
  Administered 2024-08-05: 3 mL via INTRAVENOUS

## 2024-08-05 NOTE — ED Triage Notes (Signed)
 Patient bib [facility] from home with complaints of dizziness, headache and visual changes since yesterday. She was working on animator, sudden onset of diplopia and dizziness. She had a headache intermittently the rest of the day. Today she has left sided tingling in the neck all other symptoms has resolved.    LNK was at 1pm on 08/04/2024.   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 Deaf in right ear.  Hypertension  No history of vertigo

## 2024-08-05 NOTE — ED Provider Notes (Signed)
 " [place] [facility]MENT AT [facility] Provider Note   CSN: [id number] Arrival date & time: 08/05/24  1332     Patient presents with: Headache, Diplopia, and Dizziness   Emily Sweeney is a 83 y.o. female.  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   Patient is here for evaluation of transient diplopia, headache, numbness/tingling to sides of neck, and right side of face that began yesterday afternoon.  Patient denies any history of migraines.  Last seen by ophthalmology within the last year.  Not taking a blood thinner; does report daily baby aspirin .  She lives alone.  Denies recent falls, LOC, or head injury.  Denies nausea, vomiting.  She admits to some brain fog but denies outright confusion.  Patient noted diplopia while seated at the computer yesterday afternoon.  She went to nearby couch and laid down.  The diplopia improved by the evening.  She does continue to have some diplopia this morning.  Patient reports transient left-sided back of neck numbness tingling and transiently right sided back of neck numbness tingling.  She did not look in the mirror yesterday and denies noticeable facial droop.  Last known normal yesterday at 1 PM.  Right sided facial numbness tingling is not totally abnormal for her as she has had this off and on since her right ear acoustic neuroma removal about 10 years ago.  The history is provided by the patient.  Headache Associated symptoms: dizziness   Dizziness Associated symptoms: headaches        Prior to Admission medications  Medication Sig Start Date End Date Taking? Authorizing Provider  aspirin  EC 81 MG tablet Take 1 tablet (81 mg total) by mouth daily. 10/16/14   Joshua Debby CROME, MD  atorvastatin  (LIPITOR) 20 MG tablet TAKE 1 TABLET ONCE DAILY. 03/06/24   Geofm Glade PARAS, MD  dicyclomine  (BENTYL ) 10 MG capsule  TAKE ONE CAPSULE BY MOUTH THREE TIMES DAILY BEFORE MEALS 08/29/23   Abran Norleen SAILOR, MD  fluticasone  (FLONASE ) 50 MCG/ACT nasal spray Place 1 spray into both nostrils daily as needed for allergies or rhinitis.    [provider]  metoprolol  succinate (TOPROL -XL) 25 MG 24 hr tablet TAKE 1 AND 1/2 TABLETS BY MOUTH DAILY 06/20/24   Geofm Glade PARAS, MD  Multiple Vitamins-Minerals (HAIR SKIN AND NAILS FORMULA) TABS Take 1 tablet by mouth daily.    [provider]  pantoprazole  (PROTONIX ) 40 MG tablet Take 1 tablet (40 mg total) by mouth daily. 08/29/23   Abran Norleen SAILOR, MD  Probiotic Product (PROBIOTIC ADVANCED PO) Take 1 capsule by mouth daily.    [provider]  Propylene Glycol (SYSTANE BALANCE OP) Place 1 drop into both eyes daily as needed (dry eyes).    [provider]  ramipril  (ALTACE ) 10 MG capsule TAKE ONE CAPSULE BY MOUTH DAILY 05/07/24   Geofm Glade PARAS, MD  simethicone  (MYLICON) 80 MG chewable tablet Chew 80 mg by mouth every 6 (six) hours as needed for flatulence.    [provider]  spironolactone  (ALDACTONE ) 25 MG tablet TAKE 1/2 TABLET BY MOUTH DAILY 02/22/24   Geofm Glade PARAS, MD    Allergies: Macrobid  [nitrofurantoin  macrocrystal], Norvasc  [amlodipine  besylate], Ceftin  [cefuroxime  axetil], Ciprofloxacin , Penicillins, and Sulfonamide derivatives    Review of Systems  Neurological:  Positive for dizziness and headaches.    Updated Vital Signs BP (!) 175/94 (BP Location: Right  Arm)   Pulse 83   Temp 97.9 F (36.6 C) (Oral)   Resp 16   Ht 5' 6 (1.676 m)   Wt 65.8 kg   SpO2 100%   BMI 23.40 kg/m   Physical Exam Vitals and nursing note reviewed.  Constitutional:      General: She is not in acute distress.    Appearance: Normal appearance. She is not ill-appearing, toxic-appearing or diaphoretic.  HENT:     Head: Normocephalic and atraumatic.     Nose: Nose normal.     Mouth/Throat:     Mouth: Mucous membranes are moist.     Pharynx:  Oropharynx is clear. No oropharyngeal exudate or posterior oropharyngeal erythema.  Eyes:     General: No scleral icterus.    Extraocular Movements: Extraocular movements intact.     Conjunctiva/sclera: Conjunctivae normal.     Pupils: Pupils are equal, round, and reactive to light.  Neck:     Comments: No reproducible pain with palpation of the neck.  No abnormalities noticed to the neck or right side of face. Cardiovascular:     Rate and Rhythm: Normal rate and regular rhythm.     Pulses: Normal pulses.     Heart sounds: Normal heart sounds.  Pulmonary:     Effort: Pulmonary effort is normal. No respiratory distress.     Breath sounds: Normal breath sounds. No stridor. No wheezing, rhonchi or rales.  Musculoskeletal:        General: No swelling or tenderness. Normal range of motion.     Cervical back: Normal range of motion and neck supple. No rigidity or tenderness.     Right lower leg: No edema.     Left lower leg: No edema.  Skin:    General: Skin is warm and dry.     Capillary Refill: Capillary refill takes less than 2 seconds.     Coloration: Skin is not jaundiced or pale.  Neurological:     Mental Status: She is alert and oriented to person, place, and time.     Cranial Nerves: No cranial nerve deficit.     Sensory: No sensory deficit.     Motor: No weakness.     Coordination: Coordination normal.  Psychiatric:        Mood and Affect: Mood normal.        Behavior: Behavior normal.        Thought Content: Thought content normal.        Judgment: Judgment normal.     (all labs ordered are listed, but only abnormal results are displayed) Labs Reviewed  COMPREHENSIVE METABOLIC PANEL WITH GFR - Abnormal; Notable for the following components:      Result Value   Glucose, Bld 102 (*)    Creatinine, Ser 1.08 (*)    GFR, Estimated 50 (*)    All other components within normal limits  I-STAT CHEM 8, ED - Abnormal; Notable for the following components:   Glucose, Bld 101  (*)    All other components within normal limits  PROTIME-INR  APTT  CBC  DIFFERENTIAL  ETHANOL  CBG MONITORING, ED    EKG: None  Radiology: MR Brain W and Wo Contrast Result Date: 08/05/2024 EXAM: MRI BRAIN WITH AND WITHOUT CONTRAST 08/05/2024 08:35:28 PM TECHNIQUE: Multiplanar multisequence MRI of the head/brain was performed with and without the administration of intravenous contrast. COMPARISON: MRI Brain December 10, 2019.  CT Head earlier today. CLINICAL HISTORY: Neuro deficit, acute, stroke suspected; blurry vision,  dizziness FINDINGS: BRAIN AND VENTRICLES: No acute infarct. No acute intracranial hemorrhage. No mass effect or midline shift. No hydrocephalus. The sella is unremarkable. Normal flow voids. No mass or abnormal enhancement. Similar postoperative changes of right mastoidectomy for IAC tumor resection. No visible concerning/masslike enhancement although this study is not tailored to evaluate the IAC. ORBITS: No acute abnormality. SINUSES: No acute abnormality. BONES AND SOFT TISSUES: Normal bone marrow signal and enhancement. No acute soft tissue abnormality. IMPRESSION: 1. No acute intracranial abnormality. Electronically signed by: Gilmore Molt 08/05/2024 10:02 PM EST RP Workstation: [id number]   CT HEAD WO CONTRAST Result Date: 08/05/2024 CLINICAL DATA:  Dizziness headache vision change EXAM: CT HEAD WITHOUT CONTRAST TECHNIQUE: Contiguous axial images were obtained from the base of the skull through the vertex without intravenous contrast. RADIATION DOSE REDUCTION: This exam was performed according to the departmental dose-optimization program which includes automated exposure control, adjustment of the mA and/or kV according to patient size and/or use of iterative reconstruction technique. COMPARISON:  CT 02/15/2024 FINDINGS: Brain: No acute territorial infarction, hemorrhage or intracranial mass. Atrophy and chronic small vessel ischemic changes of the white matter. Stable  ventricle size. Vascular: No hyperdense vessels.  Carotid vascular calcification Skull: No fracture. Postsurgical changes of the right mastoid temporal bone, stable Sinuses/Orbits: No acute finding. Other: None IMPRESSION: 1. No CT evidence for acute intracranial abnormality. 2. Atrophy and chronic small vessel ischemic changes of the white matter. Electronically Signed   By: Luke Bun M.D.   On: 08/05/2024 16:21     Procedures   Medications Ordered in the ED  sodium chloride  flush (NS) 0.9 % injection 3 mL (3 mLs Intravenous Given 08/05/24 1900)  gadobutrol  (GADAVIST ) 1 MMOL/ML injection 6.5 mL (6.5 mLs Intravenous Contrast Given 08/05/24 2013)    Patient presents to the ED for concern of diplopia and numbness/tingling to the right side of face and bilateral neck, this involves an extensive number of treatment options, and is a complaint that carries with it a high risk of complications and morbidity.  The differential diagnosis includes stroke, radiculopathy, complex migraine, intracranial hemorrhage, alcohol-induced, electrolyte abnormality.  Patient denies history of migraines.  Lab work does not indicate elevated ethanol or an electrolyte abnormality.  Physical exam is largely unremarkable with no obvious deficits or abnormalities. CT head and brain MRI showed no intracranial abnormalities.  Co morbidities that complicate the patient evaluation  Hypertension Anemia Prior craniectomy for excision of acoustic neuroma on the right Deaf in right ear   Lab Tests:  I Ordered, and personally interpreted labs.  The pertinent results include: Lab work is largely unremarkable.   Imaging Studies ordered:  I ordered imaging studies including CT head and MRI brain CT head and MRI brain both show no acute intracranial abnormalities.   Problem List / ED Course:     Diplopia and numbness/tingling of the face and neck.  Workup shows no obvious acute cause for symptoms today.  Patient  to continue evaluation in the outpatient setting with neurology.  Discussed return precautions including to return for further evaluation if symptoms worsen, new strokelike symptoms develop, or any other new or concerning symptoms develop, and patient verbalized understanding.  Stable for discharge.   Reevaluation:  After the interventions noted above, I reevaluated the patient and found that they have :improved   Dispostion:  After consideration of the diagnostic results and the patients response to treatment, I feel that the patent would benefit from supportive care in the home setting and continued  monitoring of symptoms with outpatient neurology referral and follow-up for ongoing evaluation of symptoms.  Return precautions given.  Medical Decision Making Amount and/or Complexity of Data Reviewed Labs: ordered. Radiology: ordered.  Risk Prescription drug management.   This note was produced using Electronics Engineer. While the provider has reviewed and verified all clinical information, transcription errors may remain.    Final diagnoses:  Diplopia  Numbness and tingling    ED Discharge Orders          Ordered    Ambulatory referral to Neurology       Comments: An appointment is requested in approximately: 1 week   08/05/24 2237               Teanna, Elem 08/05/24 2334    Patt Alm Macho, MD 08/06/24 1455  "

## 2024-08-05 NOTE — Telephone Encounter (Signed)
 Patient scheduled virtual urgent care appointment due to double vision. This started yesterday. The patient states that she has had this in the past, typically she can close her eyes and rest and this will resolve. Yesterday during this episode she also had dizziness. This is typical for when she has these episodes. She has discussed the visual disturbances with her eye doctor in the past, has not had any other workup for that.   This morning her double vision has resolved, dizziness has resolved. She does feel these episodes have been occurring over the past 1-2 months.  Denies confusion but says during the episodes that she has a brain fog  Has a known acoustic neuroma and that has effected her balance.   She does live alone.   CT imaging of brain from 02/2025  MRI 2021   Plan to forward to PCP and f/u with patient today to assure she has a plan

## 2024-08-05 NOTE — ED Notes (Signed)
Unable to obtain IV access- IV team consult placed 

## 2024-08-05 NOTE — Progress Notes (Signed)
 " Virtual Visit Consent   Emily Sweeney, you are scheduled for a virtual visit with a [facility] provider today. Just as with appointments in the office, your consent must be obtained to participate. Your consent will be active for this visit and any virtual visit you may have with one of our providers in the next 365 days. If you have a MyChart account, a copy of this consent can be sent to you electronically.  As this is a virtual visit, video technology does not allow for your provider to perform a traditional examination. This may limit your provider's ability to fully assess your condition. If your provider identifies any concerns that need to be evaluated in person or the need to arrange testing (such as labs, EKG, etc.), we will make arrangements to do so. Although advances in technology are sophisticated, we cannot ensure that it will always work on either your end or our end. If the connection with a video visit is poor, the visit may have to be switched to a telephone visit. With either a video or telephone visit, we are not always able to ensure that we have a secure connection.  By engaging in this virtual visit, you consent to the provision of healthcare and authorize for your insurance to be billed (if applicable) for the services provided during this visit. Depending on your insurance coverage, you may receive a charge related to this service.  I need to obtain your verbal consent now. Are you willing to proceed with your visit today? Emily Sweeney has provided verbal consent on 08/05/2024 for a virtual visit (video or telephone). Lauraine Kitty, FNP  Date: 08/05/2024 11:11 AM   Virtual Visit via Video Note   I, Lauraine Kitty, connected with  Emily Sweeney  ([id number], [birth year]) on 08/05/2024 at 11:30 AM EST by a telephone call   Location: Patient: Virtual Visit Location Patient: Home Provider: Virtual Visit Location Provider: Home Office   I discussed the limitations of  evaluation and management by telemedicine and the availability of in person appointments. The patient expressed understanding and agreed to proceed.    History of Present Illness: Emily Sweeney is a 83 y.o. who identifies as a female who was assigned female at birth, and is being seen today for evaluation of dizziness and blurred vision. See telephone encounter from earlier today.  Patient has been having episodes of blurred vision and dizziness over the past month. We were able to contact her PCPs office and she is scheduled for follow up Friday of this week.   Significant medical history includes Acoustic Neuroma, she has residual deafness in right ear. This has lead to chronic balance issues, but she feels this episode in the past month.   She has taken her blood pressure since our previous call and it was found to be 174/100    HPI:  Problems:  Patient Active Problem List   Diagnosis Date Noted   Acute left-sided low back pain 12/04/2022   Metacarpal bone fracture 05/05/2022   GERD (gastroesophageal reflux disease) 03/21/2021   Dizziness 03/13/2021   Hyponatremia 03/13/2021   Callus 01/31/2021   Hav (hallux abducto valgus), unspecified laterality 01/31/2021   Plantar flexed metatarsal 01/31/2021   PTTD (posterior tibial tendon dysfunction) 01/31/2021   Acute right lumbar radiculopathy 08/16/2020   Diabetes mellitus without complication (HCC) 09/20/2017   Iron deficiency anemia 07/01/2016   Chronic lumbar radiculopathy 06/17/2016   Poor balance 05/24/2016   At high risk for falls 05/24/2016  Gait abnormality 04/27/2016   Lumbosacral spondylosis without myelopathy 01/19/2016   S/P excision of acoustic neuroma 01/19/2016   B12 deficiency 10/17/2015   Pernicious anemia 06/09/2015   PVC (premature ventricular contraction) 02/25/2015   Osteoporosis 01/12/2015   Vitamin D  deficiency 01/12/2015   Localized osteoarthritis of left knee 12/18/2011   DIVERTICULITIS OF COLON  10/11/2009   Dyslipidemia 10/21/2008   Essential hypertension 10/21/2008    Allergies: Allergies[1] Medications: Current Medications[2]  Observations/Objective:  No labored breathing.  Speech is clear and coherent with logical content.  Patient is alert and oriented at baseline.    Assessment and Plan:   1. Dizziness (Primary)  2. Blurred vision  3. History of acoustic neuroma  4. Elevated blood pressure reading   After further discussion with the patient and based on increasing blood pressure readings and recent history of complaints she was directed to ED. She is agreeable to plan   Has transportation will not drive herself     Follow Up Instructions: I discussed the assessment and treatment plan with the patient. The patient was provided an opportunity to ask questions and all were answered. The patient agreed with the plan and demonstrated an understanding of the instructions.  A copy of instructions were sent to the patient via MyChart unless otherwise noted below.    The patient was advised to call back or seek an in-person evaluation if the symptoms worsen or if the condition fails to improve as anticipated.    Lauraine Kitty, FNP     [1]  Allergies Allergen Reactions   Macrobid  [Nitrofurantoin  Macrocrystal] Other (See Comments)    Fever, pain, fatigue, no appetite   Norvasc  [Amlodipine  Besylate]     Palpitations, low bp   Ceftin  [Cefuroxime  Axetil] Hives and Rash   Ciprofloxacin  Rash   Penicillins Hives and Rash    Has patient had a PCN reaction causing immediate rash, facial/tongue/throat swelling, SOB or lightheadedness with hypotension: No Has patient had a PCN reaction causing severe rash involving mucus membranes or skin necrosis: Tony Has patient had a PCN reaction that required hospitalization No Has patient had a PCN reaction occurring within the last 10 years: No If all of the above answers are NO, then may proceed with Cephalosporin use.     Sulfonamide Derivatives Hives and Rash  [2]  Current Outpatient Medications:    aspirin  EC 81 MG tablet, Take 1 tablet (81 mg total) by mouth daily., Disp: 90 tablet, Rfl: 1   atorvastatin  (LIPITOR) 20 MG tablet, TAKE 1 TABLET ONCE DAILY., Disp: 90 tablet, Rfl: 3   dicyclomine  (BENTYL ) 10 MG capsule, TAKE ONE CAPSULE BY MOUTH THREE TIMES DAILY BEFORE MEALS, Disp: 90 capsule, Rfl: 11   fluticasone  (FLONASE ) 50 MCG/ACT nasal spray, Place 1 spray into both nostrils daily as needed for allergies or rhinitis., Disp: , Rfl:    metoprolol  succinate (TOPROL -XL) 25 MG 24 hr tablet, TAKE 1 AND 1/2 TABLETS BY MOUTH DAILY, Disp: 135 tablet, Rfl: 0   Multiple Vitamins-Minerals (HAIR SKIN AND NAILS FORMULA) TABS, Take 1 tablet by mouth daily., Disp: , Rfl:    pantoprazole  (PROTONIX ) 40 MG tablet, Take 1 tablet (40 mg total) by mouth daily., Disp: 90 tablet, Rfl: 3   Probiotic Product (PROBIOTIC ADVANCED PO), Take 1 capsule by mouth daily., Disp: , Rfl:    Propylene Glycol (SYSTANE BALANCE OP), Place 1 drop into both eyes daily as needed (dry eyes)., Disp: , Rfl:    ramipril  (ALTACE ) 10 MG capsule, TAKE ONE CAPSULE  BY MOUTH DAILY, Disp: 90 capsule, Rfl: 3   simethicone  (MYLICON) 80 MG chewable tablet, Chew 80 mg by mouth every 6 (six) hours as needed for flatulence., Disp: , Rfl:    spironolactone  (ALDACTONE ) 25 MG tablet, TAKE 1/2 TABLET BY MOUTH DAILY, Disp: 45 tablet, Rfl: 3  "

## 2024-08-05 NOTE — ED Provider Triage Note (Signed)
 Emergency Medicine Provider Triage Evaluation Note  Emily Sweeney , a 83 y.o. female  was evaluated in triage.  Pt complains of headache and double vision.  Patient reports that between 1 and 130 yesterday she began having a headache with double vision and right sided facial tingling.  During that time she had difficulty walking but did not fall.  She is now having tingling in her neck and a headache but no diplopia or dizziness.  Neurologically intact  Review of Systems  Positive: headache Negative:   Physical Exam  BP (!) 162/104 (BP Location: Right Arm)   Pulse 83   Temp 98.4 F (36.9 C) (Oral)   Resp 18   Ht 5' 6 (1.676 m)   Wt 65.8 kg   SpO2 100%   BMI 23.40 kg/m  Gen:   Awake, no distress   Resp:  Normal effort  MSK:   Moves extremities without difficulty  Other:    Medical Decision Making  Medically screening exam initiated at 2:47 PM.  Appropriate orders placed.  Emily Sweeney was informed that the remainder of the evaluation will be completed by another provider, this initial triage assessment does not replace that evaluation, and the importance of remaining in the ED until their evaluation is complete.     Phillip, [place], [place] 08/05/24 1502

## 2024-08-05 NOTE — Discharge Instructions (Addendum)
 It was a pleasure meeting with you today.  As we discussed there were no abnormalities seen on your head CT or brain MRI today.  Recommend follow-up with outpatient neurology for ongoing evaluation.  Please return for further evaluation if you experience worsening of symptoms, slurred speech, facial droop, confusion, loss of consciousness, severe headache, shortness of breath, chest pain, or any other new or concerning symptoms.

## 2024-08-05 NOTE — Telephone Encounter (Signed)
 Spoke with patient and she is agreeable to come into the office. We have her scheduled for 08/08/2024 @ 820am, as she state she will have a ride to bring her.  Educated to call EMS or have someone take her to the ER if the incidents increase and duration is longer than expected.  Expressed understanding, no additional questions at this time.

## 2024-08-08 ENCOUNTER — Ambulatory Visit: Admitting: Family Medicine

## 2024-08-16 ENCOUNTER — Other Ambulatory Visit: Payer: Self-pay | Admitting: Internal Medicine

## 2024-08-16 DIAGNOSIS — K219 Gastro-esophageal reflux disease without esophagitis: Secondary | ICD-10-CM

## 2024-08-18 ENCOUNTER — Encounter: Payer: Self-pay | Admitting: Diagnostic Neuroimaging

## 2024-08-18 ENCOUNTER — Ambulatory Visit: Admitting: Diagnostic Neuroimaging

## 2024-08-18 VITALS — BP 155/95 | HR 95 | Ht 66.0 in | Wt 149.6 lb

## 2024-08-18 DIAGNOSIS — H02401 Unspecified ptosis of right eyelid: Secondary | ICD-10-CM

## 2024-08-18 DIAGNOSIS — H532 Diplopia: Secondary | ICD-10-CM

## 2024-08-18 NOTE — Progress Notes (Signed)
 "  [facility]  PATIENT: Emily Sweeney DOB: 08/28/1940  REFERRING CLINICIAN: Rosina Almarie LABOR, PA-C HISTORY FROM: patient REASON FOR VISIT: new consult   HISTORICAL  CHIEF COMPLAINT:  Chief Complaint  Patient presents with   RM 7     Patient is here alone for episode of numbness/tingling, double vision/dizziness - was seen 08/05/24 MRI and scans were done.     HISTORY OF PRESENT ILLNESS:   84 year old female here for evaluation of intermittent double vision.  History of right acoustic neuroma status post surgery in 2009.  Symptoms started about 1 month ago with few minutes of temporary double vision.  Symptoms were intermittent initially.  She had an episode lasting as long as 30 minutes.  08/05/2024 symptoms worsened and patient went to the hospital for evaluation.  Blood pressure was noted to be significant elevated.  MRI of the brain was obtained which was unremarkable.  She was discharged home.  Patient not sure if symptoms improved by closing 1 eye or not.  Denies any speech, swallowing, or breathing issues.  No weakness in arms or legs.   REVIEW OF SYSTEMS: Full 14 system review of systems performed and negative with exception of: as per HPI.  ALLERGIES: Allergies[1]  HOME MEDICATIONS: Outpatient Medications Prior to Visit  Medication Sig Dispense Refill   aspirin  EC 81 MG tablet Take 1 tablet (81 mg total) by mouth daily. 90 tablet 1   atorvastatin  (LIPITOR) 20 MG tablet TAKE 1 TABLET ONCE DAILY. 90 tablet 3   dicyclomine  (BENTYL ) 10 MG capsule TAKE ONE CAPSULE BY MOUTH THREE TIMES DAILY BEFORE MEALS 90 capsule 11   fluticasone  (FLONASE ) 50 MCG/ACT nasal spray Place 1 spray into both nostrils daily as needed for allergies or rhinitis.     metoprolol  succinate (TOPROL -XL) 25 MG 24 hr tablet TAKE 1 AND 1/2 TABLETS BY MOUTH DAILY 135 tablet 0   Multiple Vitamins-Minerals (HAIR SKIN AND NAILS FORMULA) TABS Take 1 tablet by mouth daily.      pantoprazole  (PROTONIX ) 40 MG tablet Take 1 tablet (40 mg total) by mouth daily. Office visit for further refills 90 tablet 0   Probiotic Product (PROBIOTIC ADVANCED PO) Take 1 capsule by mouth daily.     Propylene Glycol (SYSTANE BALANCE OP) Place 1 drop into both eyes daily as needed (dry eyes).     ramipril  (ALTACE ) 10 MG capsule TAKE ONE CAPSULE BY MOUTH DAILY 90 capsule 3   simethicone  (MYLICON) 80 MG chewable tablet Chew 80 mg by mouth every 6 (six) hours as needed for flatulence.     spironolactone  (ALDACTONE ) 25 MG tablet TAKE 1/2 TABLET BY MOUTH DAILY 45 tablet 3   No facility-administered medications prior to visit.    PAST MEDICAL HISTORY: Past Medical History:  Diagnosis Date   Anemia    Arthritis of left knee 2022   Blood transfusion without reported diagnosis    Diverticulosis of colon    Fracture, finger 2023   GERD (gastroesophageal reflux disease)    Hearing loss    right ear, and tinnitus   Hiatal hernia    Hyperlipidemia    Hypertension    Hyponatremia 2022   Tubular adenoma 03/05/2015   Polyp, and Benign lymphoid polyp.   UTI (urinary tract infection)     PAST SURGICAL HISTORY: Past Surgical History:  Procedure Laterality Date   APPENDECTOMY     BREAST BIOPSY     benign   CATARACT EXTRACTION, BILATERAL  2019, 2020   COCHLEAR IMPLANT  CRANIECTOMY FOR EXCISION OF ACOUSTIC NEUROMA  2009   KNEE ARTHROPLASTY  03/2022   NASAL SEPTUM SURGERY     TONSILLECTOMY     UPPER GASTROINTESTINAL ENDOSCOPY  06/02/2020    FAMILY HISTORY: Family History  Problem Relation Age of Onset   Dementia Mother 22   Sudden death Father 26       MI   Coronary artery disease Father    Heart disease Father    Alcohol abuse Brother    Breast cancer Daughter    Colon cancer Neg Hx    Esophageal cancer Neg Hx    Migraines Neg Hx    Seizures Neg Hx    Stroke Neg Hx    Sleep apnea Neg Hx     SOCIAL HISTORY: Social History   Socioeconomic History   Marital status:  Divorced    Spouse name: Not on file   Number of children: Not on file   Years of education: Not on file   Highest education level: Master's degree (e.g., MA, MS, MEng, MEd, MSW, MBA)  Occupational History   Occupation: psychologist  Tobacco Use   Smoking status: Former    Current packs/day: 0.00    Types: Cigarettes    Quit date: 07/04/1991    Years since quitting: 33.1    Passive exposure: Past   Smokeless tobacco: Never  Vaping Use   Vaping status: Never Used  Substance and Sexual Activity   Alcohol use: Yes    Alcohol/week: 0.0 standard drinks of alcohol    Comment: glass wine 4-5 days a week   Drug use: No   Sexual activity: Not Currently  Other Topics Concern   Not on file  Social History Narrative   No coffee, some tea and diet pepsi 1 daily    Social Drivers of Health   Tobacco Use: Medium Risk (08/18/2024)   Patient History    Smoking Tobacco Use: Former    Smokeless Tobacco Use: Never    Passive Exposure: Past  Physicist, Medical Strain: Medium Risk (10/29/2023)   Overall Financial Resource Strain (CARDIA)    Difficulty of Paying Living Expenses: Somewhat hard  Food Insecurity: No Food Insecurity (10/29/2023)   Hunger Vital Sign    Worried About Running Out of Food in the Last Year: Never true    Ran Out of Food in the Last Year: Never true  Transportation Needs: No Transportation Needs (10/29/2023)   PRAPARE - Administrator, Civil Service (Medical): No    Lack of Transportation (Non-Medical): No  Physical Activity: Sufficiently Active (10/29/2023)   Exercise Vital Sign    Days of Exercise per Week: 5 days    Minutes of Exercise per Session: 40 min  Stress: No Stress Concern Present (10/29/2023)   [employer] of Occupational Health - Occupational Stress Questionnaire    Feeling of Stress : Only a little  Social Connections: Moderately Isolated (10/29/2023)   Social Connection and Isolation Panel    Frequency of Communication with Friends and  Family: More than three times a week    Frequency of Social Gatherings with Friends and Family: More than three times a week    Attends Religious Services: Never    Database Administrator or Organizations: Yes    Attends Banker Meetings: Never    Marital Status: Divorced  Catering Manager Violence: Not At Risk (10/29/2023)   Humiliation, Afraid, Rape, and Kick questionnaire    Fear of Current or Ex-Partner: No  Emotionally Abused: No    Physically Abused: No    Sexually Abused: No  Depression (PHQ2-9): Low Risk (10/29/2023)   Depression (PHQ2-9)    PHQ-2 Score: 0  Alcohol Screen: Low Risk (10/29/2023)   Alcohol Screen    Last Alcohol Screening Score (AUDIT): 2  Housing: Low Risk (10/29/2023)   Housing Stability Vital Sign    Unable to Pay for Housing in the Last Year: No    Number of Times Moved in the Last Year: 0    Homeless in the Last Year: No  Utilities: Not At Risk (10/29/2023)   AHC Utilities    Threatened with loss of utilities: No  Health Literacy: Adequate Health Literacy (10/29/2023)   B1300 Health Literacy    Frequency of need for help with medical instructions: Never     PHYSICAL EXAM  GENERAL EXAM/CONSTITUTIONAL: Vitals:  Vitals:   08/18/24 1557  BP: (!) 155/95  Pulse: 95  Weight: 149 lb 9.6 oz (67.9 kg)  Height: 5' 6 (1.676 m)   Body mass index is 24.15 kg/m. Wt Readings from Last 3 Encounters:  08/18/24 149 lb 9.6 oz (67.9 kg)  08/05/24 145 lb (65.8 kg)  02/15/24 145 lb (65.8 kg)   Patient is in no distress; well developed, nourished and groomed; neck is supple  CARDIOVASCULAR: Examination of carotid arteries is normal; no carotid bruits Regular rate and rhythm, no murmurs Examination of peripheral vascular system by observation and palpation is normal  EYES: Ophthalmoscopic exam of optic discs and posterior segments is normal; no papilledema or hemorrhages No results found.  MUSCULOSKELETAL: Gait, strength, tone, movements  noted in Neurologic exam below  NEUROLOGIC: MENTAL STATUS:     12/14/2017   10:43 AM  MMSE - Mini Mental State Exam  Not completed: Refused   awake, alert, oriented to person, place and time recent and remote memory intact normal attention and concentration language fluent, comprehension intact, naming intact fund of knowledge appropriate  CRANIAL NERVE:  2nd - no papilledema on fundoscopic exam 2nd, 3rd, 4th, 6th - pupils equal and reactive to light, visual fields full to confrontation, extraocular muscles intact, no nystagmus; SUBTLE RIGHT PTOSIS 5th - facial sensation symmetric 7th - facial strength symmetric 8th - hearing intact 9th - palate elevates symmetrically, uvula midline 11th - shoulder shrug symmetric 12th - tongue protrusion midline  MOTOR:  normal bulk and tone, full strength in the BUE, BLE  SENSORY:  normal and symmetric to light touch, temperature, vibration  COORDINATION:  finger-nose-finger, fine finger movements normal  REFLEXES:  deep tendon reflexes TRACE and symmetric  GAIT/STATION:  narrow based gait     DIAGNOSTIC DATA (LABS, IMAGING, TESTING) - I reviewed patient records, labs, notes, testing and imaging myself where available.  Lab Results  Component Value Date   WBC 5.9 08/05/2024   HGB 13.3 08/05/2024   HCT 39.0 08/05/2024   MCV 90.3 08/05/2024   PLT 203 08/05/2024      Component Value Date/Time   NA 136 08/05/2024 1453   K 4.3 08/05/2024 1453   CL 101 08/05/2024 1453   CO2 24 08/05/2024 1356   GLUCOSE 101 (H) 08/05/2024 1453   BUN 16 08/05/2024 1453   CREATININE 1.00 08/05/2024 1453   CALCIUM  9.5 08/05/2024 1356   PROT 6.8 08/05/2024 1356   ALBUMIN 4.3 08/05/2024 1356   AST 23 08/05/2024 1356   ALT 13 08/05/2024 1356   ALKPHOS 87 08/05/2024 1356   BILITOT 0.6 08/05/2024 1356   GFRNONAA 50 (  L) 08/05/2024 1356   GFRAA >60 06/13/2018 1438   Lab Results  Component Value Date   CHOL 138 11/07/2023   HDL 70.60  11/07/2023   LDLCALC 59 11/07/2023   TRIG 43.0 11/07/2023   CHOLHDL 2 11/07/2023   Lab Results  Component Value Date   HGBA1C 6.3 11/07/2023   Lab Results  Component Value Date   VITAMINB12 118 (L) 11/07/2023   Lab Results  Component Value Date   TSH 2.84 11/09/2022    08/05/24 MRI brain [I reviewed images myself and agree with interpretation. -VRP]  - No acute intracranial abnormality.     ASSESSMENT AND PLAN  84 y.o. year old female here with transient intermittent double vision episodes, almost on daily basis for past 1 month.  Has noted some drooping of right eye lid.  Findings suspicious for neuromuscular junction disorder such as myasthenia gravis.  Will proceed with further workup to rule out other causes.  Dx:  1. Double vision   2. Acquired ptosis of eyelid, right     PLAN:  INTERMITTENT DOUBLE VISION (since early Dec 2025) - check neuromuscular labs; then consider pyridostigmine  B12 deficiency - follow up B12 deficiency with PCP (restart replacement)  Orders Placed This Encounter  Procedures   AChR Abs with Reflex to MuSK   Vitamin B12   Intrinsic Factor Antibodies   Anti-parietal antibody   TSH Rfx on Abnormal to Free T4   CK   Aldolase   Return for pending if symptoms worsen or fail to improve, pending test results.  I reviewed images, labs, notes, records myself. I summarized findings and reviewed with patient, for this high risk condition (double vision, right ptosis) requiring high complexity decision making.    EDUARD FABIENE HANLON, MD 08/18/2024, 4:22 PM Certified in Neurology, Neurophysiology and Neuroimaging  [facility] [address] [place], [place] [address] [phone]     [1]  Allergies Allergen Reactions   Macrobid  [Nitrofurantoin  Macrocrystal] Other (See Comments)    Fever, pain, fatigue, no appetite   Norvasc  [Amlodipine  Besylate]     Palpitations, low bp   Ceftin  [Cefuroxime  Axetil] Hives  and Rash   Ciprofloxacin  Rash   Penicillins Hives and Rash    Has patient had a PCN reaction causing immediate rash, facial/tongue/throat swelling, SOB or lightheadedness with hypotension: No Has patient had a PCN reaction causing severe rash involving mucus membranes or skin necrosis: Tony Has patient had a PCN reaction that required hospitalization No Has patient had a PCN reaction occurring within the last 10 years: No If all of the above answers are NO, then may proceed with Cephalosporin use.    Sulfonamide Derivatives Hives and Rash   "

## 2024-08-18 NOTE — Patient Instructions (Signed)
" °  INTERMITTENT DOUBLE VISION (since early Dec 2025) - check neuromuscular labs - follow up B12 deficiency with PCP (restart replacement) "

## 2024-08-19 LAB — TSH RFX ON ABNORMAL TO FREE T4: TSH: 2.75 u[IU]/mL (ref 0.450–4.500)

## 2024-08-25 ENCOUNTER — Telehealth: Payer: Self-pay | Admitting: Diagnostic Neuroimaging

## 2024-08-25 NOTE — Telephone Encounter (Signed)
 Pt called to request  to speak to MD about next steps the patient stated she has received lab results and wanted to discuss next steps

## 2024-08-26 NOTE — Telephone Encounter (Signed)
 Please result labs and next steps when available. Emily Sweeney

## 2024-08-30 LAB — VITAMIN B12: Vitamin B-12: 335 pg/mL (ref 232–1245)

## 2024-08-30 LAB — ANTI-PARIETAL ANTIBODY: Parietal Cell Ab: 1.6 U (ref 0.0–20.0)

## 2024-08-30 LAB — INTRINSIC FACTOR ANTIBODIES: Intrinsic Factor Abs, Serum: 1.1 [AU]/ml (ref 0.0–1.1)

## 2024-08-30 LAB — MUSK ANTIBODIES: MuSK Antibodies: <1 U/mL

## 2024-08-30 LAB — ACHR ABS WITH REFLEX TO MUSK: AChR Binding Ab, Serum: <0.07 nmol/L (ref 0.00–0.24)

## 2024-08-30 LAB — ALDOLASE: Aldolase: 3.5 U/L (ref 3.3–10.3)

## 2024-08-30 LAB — CK: Total CK: 66 U/L (ref 26–161)

## 2024-09-02 ENCOUNTER — Ambulatory Visit: Payer: Self-pay | Admitting: Diagnostic Neuroimaging

## 2024-09-03 NOTE — Telephone Encounter (Signed)
 PLAN: 08/18/24   INTERMITTENT DOUBLE VISION (since early Dec 2025) - check neuromuscular labs; then consider pyridostigmine   B12 deficiency - follow up B12 deficiency with PCP (restart replacement)  Please advise on next steps regarding patient's symptoms

## 2024-09-03 NOTE — Telephone Encounter (Signed)
 I called the patient and left a message for her to call the office back regarding lab results

## 2024-11-03 ENCOUNTER — Encounter: Admitting: Internal Medicine

## 2024-11-03 ENCOUNTER — Ambulatory Visit
